# Patient Record
Sex: Male | Born: 1990 | Race: White | Hispanic: No | Marital: Married | Smoking: Former smoker
Health system: Southern US, Community
[De-identification: ages and names within clinical notes are randomized; demographics above are authoritative.]

## PROBLEM LIST (undated history)

## (undated) DIAGNOSIS — K859 Acute pancreatitis without necrosis or infection, unspecified: Secondary | ICD-10-CM

## (undated) DIAGNOSIS — E669 Obesity, unspecified: Secondary | ICD-10-CM

## (undated) DIAGNOSIS — R51 Headache: Secondary | ICD-10-CM

## (undated) DIAGNOSIS — I4892 Unspecified atrial flutter: Secondary | ICD-10-CM

## (undated) DIAGNOSIS — I480 Paroxysmal atrial fibrillation: Secondary | ICD-10-CM

## (undated) DIAGNOSIS — I420 Dilated cardiomyopathy: Secondary | ICD-10-CM

## (undated) DIAGNOSIS — F329 Major depressive disorder, single episode, unspecified: Secondary | ICD-10-CM

## (undated) DIAGNOSIS — G8929 Other chronic pain: Secondary | ICD-10-CM

## (undated) DIAGNOSIS — K760 Fatty (change of) liver, not elsewhere classified: Secondary | ICD-10-CM

## (undated) DIAGNOSIS — M545 Low back pain, unspecified: Secondary | ICD-10-CM

## (undated) DIAGNOSIS — I5022 Chronic systolic (congestive) heart failure: Secondary | ICD-10-CM

## (undated) DIAGNOSIS — I483 Typical atrial flutter: Secondary | ICD-10-CM

## (undated) DIAGNOSIS — I4891 Unspecified atrial fibrillation: Secondary | ICD-10-CM

## (undated) DIAGNOSIS — F419 Anxiety disorder, unspecified: Secondary | ICD-10-CM

## (undated) DIAGNOSIS — I429 Cardiomyopathy, unspecified: Secondary | ICD-10-CM

## (undated) DIAGNOSIS — I509 Heart failure, unspecified: Secondary | ICD-10-CM

## (undated) DIAGNOSIS — I209 Angina pectoris, unspecified: Secondary | ICD-10-CM

## (undated) DIAGNOSIS — K863 Pseudocyst of pancreas: Secondary | ICD-10-CM

## (undated) DIAGNOSIS — I499 Cardiac arrhythmia, unspecified: Secondary | ICD-10-CM

## (undated) DIAGNOSIS — R519 Headache, unspecified: Secondary | ICD-10-CM

## (undated) DIAGNOSIS — K219 Gastro-esophageal reflux disease without esophagitis: Secondary | ICD-10-CM

## (undated) DIAGNOSIS — F32A Depression, unspecified: Secondary | ICD-10-CM

## (undated) DIAGNOSIS — M1990 Unspecified osteoarthritis, unspecified site: Secondary | ICD-10-CM

## (undated) HISTORY — DX: Dilated cardiomyopathy: I42.0

## (undated) HISTORY — DX: Typical atrial flutter: I48.3

## (undated) HISTORY — DX: Unspecified atrial flutter: I48.92

## (undated) HISTORY — PX: FRACTURE SURGERY: SHX138

## (undated) HISTORY — PX: TONSILLECTOMY AND ADENOIDECTOMY: SUR1326

## (undated) HISTORY — PX: HAND SURGERY: SHX662

## (undated) HISTORY — DX: Chronic systolic (congestive) heart failure: I50.22

## (undated) HISTORY — DX: Cardiomyopathy, unspecified: I42.9

## (undated) HISTORY — DX: Pseudocyst of pancreas: K86.3

## (undated) HISTORY — DX: Unspecified atrial fibrillation: I48.91

---

## 2010-08-30 DIAGNOSIS — K859 Acute pancreatitis without necrosis or infection, unspecified: Secondary | ICD-10-CM

## 2010-08-30 HISTORY — DX: Acute pancreatitis without necrosis or infection, unspecified: K85.90

## 2014-01-13 DIAGNOSIS — R109 Unspecified abdominal pain: Secondary | ICD-10-CM | POA: Insufficient documentation

## 2014-01-14 ENCOUNTER — Encounter (HOSPITAL_COMMUNITY): Payer: Self-pay | Admitting: Emergency Medicine

## 2014-01-14 ENCOUNTER — Emergency Department (HOSPITAL_COMMUNITY)
Admission: EM | Admit: 2014-01-14 | Discharge: 2014-01-14 | Discharge status: Against Medical Advice | Payer: BC Managed Care – PPO | Attending: Emergency Medicine | Admitting: Emergency Medicine

## 2014-01-14 HISTORY — DX: Acute pancreatitis without necrosis or infection, unspecified: K85.90

## 2014-01-14 LAB — CBC WITH DIFFERENTIAL/PLATELET
Basophils Absolute: 0 10*3/uL (ref 0.0–0.1)
Basophils Relative: 0 % (ref 0–1)
Eosinophils Absolute: 0.3 10*3/uL (ref 0.0–0.7)
Eosinophils Relative: 2 % (ref 0–5)
HCT: 43.8 % (ref 39.0–52.0)
Hemoglobin: 14.7 g/dL (ref 13.0–17.0)
Lymphocytes Relative: 19 % (ref 12–46)
Lymphs Abs: 2.2 10*3/uL (ref 0.7–4.0)
MCH: 30.2 pg (ref 26.0–34.0)
MCHC: 33.6 g/dL (ref 30.0–36.0)
MCV: 90.1 fL (ref 78.0–100.0)
Monocytes Absolute: 1 10*3/uL (ref 0.1–1.0)
Monocytes Relative: 8 % (ref 3–12)
Neutro Abs: 8.2 10*3/uL — ABNORMAL HIGH (ref 1.7–7.7)
Neutrophils Relative %: 71 % (ref 43–77)
Platelets: 308 10*3/uL (ref 150–400)
RBC: 4.86 MIL/uL (ref 4.22–5.81)
RDW: 13.3 % (ref 11.5–15.5)
WBC: 11.7 10*3/uL — ABNORMAL HIGH (ref 4.0–10.5)

## 2014-01-14 LAB — URINALYSIS, ROUTINE W REFLEX MICROSCOPIC
Glucose, UA: NEGATIVE mg/dL
Hgb urine dipstick: NEGATIVE
Ketones, ur: NEGATIVE mg/dL
Nitrite: NEGATIVE
Protein, ur: NEGATIVE mg/dL
Specific Gravity, Urine: 1.028 (ref 1.005–1.030)
Urobilinogen, UA: 1 mg/dL (ref 0.0–1.0)
pH: 6 (ref 5.0–8.0)

## 2014-01-14 LAB — COMPREHENSIVE METABOLIC PANEL
ALT: 25 U/L (ref 0–53)
AST: 26 U/L (ref 0–37)
Albumin: 3.9 g/dL (ref 3.5–5.2)
Alkaline Phosphatase: 47 U/L (ref 39–117)
BUN: 12 mg/dL (ref 6–23)
CO2: 21 mEq/L (ref 19–32)
Calcium: 8.9 mg/dL (ref 8.4–10.5)
Chloride: 105 mEq/L (ref 96–112)
Creatinine, Ser: 0.6 mg/dL (ref 0.50–1.35)
GFR calc Af Amer: >90 mL/min (ref >90)
GFR calc non Af Amer: >90 mL/min (ref >90)
Glucose, Bld: 95 mg/dL (ref 70–99)
Potassium: 3.9 mEq/L (ref 3.7–5.3)
Sodium: 141 mEq/L (ref 137–147)
Total Bilirubin: 0.7 mg/dL (ref 0.3–1.2)
Total Protein: 7.7 g/dL (ref 6.0–8.3)

## 2014-01-14 LAB — URINE MICROSCOPIC-ADD ON

## 2014-01-14 LAB — LIPASE, BLOOD: Lipase: 2636 U/L — ABNORMAL HIGH (ref 11–59)

## 2014-01-14 NOTE — ED Notes (Signed)
Pt. reports mid abdominal pain onset this morning , denies nausea/vomitting or diarrhea. No fever or chills. Pt. stated history of pancreatitis.

## 2014-01-14 NOTE — ED Notes (Signed)
No answer x1, unable to find in w/r.

## 2015-12-29 DIAGNOSIS — K760 Fatty (change of) liver, not elsewhere classified: Secondary | ICD-10-CM

## 2015-12-29 HISTORY — DX: Fatty (change of) liver, not elsewhere classified: K76.0

## 2016-01-17 ENCOUNTER — Encounter (HOSPITAL_COMMUNITY): Payer: Self-pay | Admitting: Oncology

## 2016-01-17 ENCOUNTER — Emergency Department (HOSPITAL_COMMUNITY): Payer: BLUE CROSS/BLUE SHIELD

## 2016-01-17 ENCOUNTER — Inpatient Hospital Stay (HOSPITAL_COMMUNITY): Payer: BLUE CROSS/BLUE SHIELD

## 2016-01-17 ENCOUNTER — Inpatient Hospital Stay (HOSPITAL_COMMUNITY)
Admission: EM | Admit: 2016-01-17 | Discharge: 2016-01-27 | DRG: 438 | Disposition: A | Discharge status: Home / Self Care | Payer: BLUE CROSS/BLUE SHIELD | Attending: Internal Medicine | Admitting: Internal Medicine

## 2016-01-17 DIAGNOSIS — I5022 Chronic systolic (congestive) heart failure: Secondary | ICD-10-CM | POA: Insufficient documentation

## 2016-01-17 DIAGNOSIS — I4891 Unspecified atrial fibrillation: Secondary | ICD-10-CM | POA: Diagnosis present

## 2016-01-17 DIAGNOSIS — F1010 Alcohol abuse, uncomplicated: Secondary | ICD-10-CM | POA: Diagnosis not present

## 2016-01-17 DIAGNOSIS — Z833 Family history of diabetes mellitus: Secondary | ICD-10-CM

## 2016-01-17 DIAGNOSIS — I429 Cardiomyopathy, unspecified: Secondary | ICD-10-CM | POA: Diagnosis present

## 2016-01-17 DIAGNOSIS — K860 Alcohol-induced chronic pancreatitis: Secondary | ICD-10-CM | POA: Diagnosis present

## 2016-01-17 DIAGNOSIS — K8500 Idiopathic acute pancreatitis without necrosis or infection: Secondary | ICD-10-CM | POA: Diagnosis not present

## 2016-01-17 DIAGNOSIS — I493 Ventricular premature depolarization: Secondary | ICD-10-CM | POA: Diagnosis present

## 2016-01-17 DIAGNOSIS — Z87891 Personal history of nicotine dependence: Secondary | ICD-10-CM | POA: Diagnosis not present

## 2016-01-17 DIAGNOSIS — R1110 Vomiting, unspecified: Secondary | ICD-10-CM

## 2016-01-17 DIAGNOSIS — K852 Alcohol induced acute pancreatitis without necrosis or infection: Secondary | ICD-10-CM | POA: Diagnosis not present

## 2016-01-17 DIAGNOSIS — I509 Heart failure, unspecified: Secondary | ICD-10-CM

## 2016-01-17 DIAGNOSIS — K859 Acute pancreatitis without necrosis or infection, unspecified: Secondary | ICD-10-CM | POA: Diagnosis not present

## 2016-01-17 DIAGNOSIS — K863 Pseudocyst of pancreas: Secondary | ICD-10-CM | POA: Insufficient documentation

## 2016-01-17 DIAGNOSIS — I5021 Acute systolic (congestive) heart failure: Secondary | ICD-10-CM | POA: Diagnosis present

## 2016-01-17 DIAGNOSIS — E785 Hyperlipidemia, unspecified: Secondary | ICD-10-CM | POA: Diagnosis present

## 2016-01-17 DIAGNOSIS — F1020 Alcohol dependence, uncomplicated: Secondary | ICD-10-CM | POA: Diagnosis not present

## 2016-01-17 DIAGNOSIS — I5041 Acute combined systolic (congestive) and diastolic (congestive) heart failure: Secondary | ICD-10-CM | POA: Diagnosis not present

## 2016-01-17 DIAGNOSIS — R109 Unspecified abdominal pain: Secondary | ICD-10-CM | POA: Diagnosis not present

## 2016-01-17 DIAGNOSIS — Z6841 Body Mass Index (BMI) 40.0 and over, adult: Secondary | ICD-10-CM

## 2016-01-17 DIAGNOSIS — R6510 Systemic inflammatory response syndrome (SIRS) of non-infectious origin without acute organ dysfunction: Secondary | ICD-10-CM

## 2016-01-17 DIAGNOSIS — Z8719 Personal history of other diseases of the digestive system: Secondary | ICD-10-CM

## 2016-01-17 DIAGNOSIS — F1011 Alcohol abuse, in remission: Secondary | ICD-10-CM

## 2016-01-17 DIAGNOSIS — I483 Typical atrial flutter: Secondary | ICD-10-CM

## 2016-01-17 DIAGNOSIS — I4892 Unspecified atrial flutter: Secondary | ICD-10-CM | POA: Diagnosis present

## 2016-01-17 DIAGNOSIS — R0683 Snoring: Secondary | ICD-10-CM | POA: Diagnosis present

## 2016-01-17 DIAGNOSIS — K861 Other chronic pancreatitis: Secondary | ICD-10-CM | POA: Diagnosis not present

## 2016-01-17 DIAGNOSIS — R9431 Abnormal electrocardiogram [ECG] [EKG]: Secondary | ICD-10-CM | POA: Diagnosis not present

## 2016-01-17 DIAGNOSIS — K862 Cyst of pancreas: Secondary | ICD-10-CM | POA: Diagnosis not present

## 2016-01-17 HISTORY — DX: Fatty (change of) liver, not elsewhere classified: K76.0

## 2016-01-17 HISTORY — DX: Obesity, unspecified: E66.9

## 2016-01-17 HISTORY — DX: Unspecified atrial flutter: I48.92

## 2016-01-17 HISTORY — DX: Heart failure, unspecified: I50.9

## 2016-01-17 LAB — CBC WITH DIFFERENTIAL/PLATELET
Basophils Absolute: 0 10*3/uL (ref 0.0–0.1)
Basophils Relative: 0 %
Eosinophils Absolute: 0.1 10*3/uL (ref 0.0–0.7)
Eosinophils Relative: 1 %
HCT: 41.7 % (ref 39.0–52.0)
Hemoglobin: 13.9 g/dL (ref 13.0–17.0)
Lymphocytes Relative: 17 %
Lymphs Abs: 2.8 10*3/uL (ref 0.7–4.0)
MCH: 29.3 pg (ref 26.0–34.0)
MCHC: 33.3 g/dL (ref 30.0–36.0)
MCV: 88 fL (ref 78.0–100.0)
Monocytes Absolute: 1.2 10*3/uL — ABNORMAL HIGH (ref 0.1–1.0)
Monocytes Relative: 7 %
Neutro Abs: 12.6 10*3/uL — ABNORMAL HIGH (ref 1.7–7.7)
Neutrophils Relative %: 75 %
Platelets: 315 10*3/uL (ref 150–400)
RBC: 4.74 MIL/uL (ref 4.22–5.81)
RDW: 13.2 % (ref 11.5–15.5)
WBC: 16.8 10*3/uL — ABNORMAL HIGH (ref 4.0–10.5)

## 2016-01-17 LAB — URINALYSIS, ROUTINE W REFLEX MICROSCOPIC
Bilirubin Urine: NEGATIVE
Glucose, UA: NEGATIVE mg/dL
Hgb urine dipstick: NEGATIVE
Ketones, ur: NEGATIVE mg/dL
Nitrite: NEGATIVE
Protein, ur: NEGATIVE mg/dL
Specific Gravity, Urine: 1.02 (ref 1.005–1.030)
pH: 6 (ref 5.0–8.0)

## 2016-01-17 LAB — COMPREHENSIVE METABOLIC PANEL
ALT: 26 U/L (ref 17–63)
AST: 21 U/L (ref 15–41)
Albumin: 3.9 g/dL (ref 3.5–5.0)
Alkaline Phosphatase: 34 U/L — ABNORMAL LOW (ref 38–126)
Anion gap: 6 (ref 5–15)
BUN: 18 mg/dL (ref 6–20)
CO2: 23 mmol/L (ref 22–32)
Calcium: 8.6 mg/dL — ABNORMAL LOW (ref 8.9–10.3)
Chloride: 111 mmol/L (ref 101–111)
Creatinine, Ser: 0.87 mg/dL (ref 0.61–1.24)
GFR calc Af Amer: >60 mL/min (ref >60)
GFR calc non Af Amer: >60 mL/min (ref >60)
Glucose, Bld: 100 mg/dL — ABNORMAL HIGH (ref 65–99)
Potassium: 4.3 mmol/L (ref 3.5–5.1)
Sodium: 140 mmol/L (ref 135–145)
Total Bilirubin: 0.6 mg/dL (ref 0.3–1.2)
Total Protein: 6.9 g/dL (ref 6.5–8.1)

## 2016-01-17 LAB — LIPID PANEL
Cholesterol: 128 mg/dL (ref 0–200)
HDL: 37 mg/dL — ABNORMAL LOW (ref >40)
LDL Cholesterol: 65 mg/dL (ref 0–99)
Total CHOL/HDL Ratio: 3.5 RATIO
Triglycerides: 132 mg/dL (ref <150)
VLDL: 26 mg/dL (ref 0–40)

## 2016-01-17 LAB — URINE MICROSCOPIC-ADD ON: Bacteria, UA: NONE SEEN

## 2016-01-17 LAB — ECHOCARDIOGRAM COMPLETE
Height: 72 in
Weight: 5319.26 oz

## 2016-01-17 LAB — LIPASE, BLOOD: Lipase: 1745 U/L — ABNORMAL HIGH (ref 11–51)

## 2016-01-17 MED ORDER — METOPROLOL TARTRATE 5 MG/5ML IV SOLN
2.5000 mg | Freq: Once | INTRAVENOUS | Status: AC
  Administered 2016-01-17: 2.5 mg via INTRAVENOUS

## 2016-01-17 MED ORDER — DILTIAZEM HCL 100 MG IV SOLR
INTRAVENOUS | Status: AC
  Filled 2016-01-17: qty 100

## 2016-01-17 MED ORDER — FOLIC ACID 1 MG PO TABS
1.0000 mg | ORAL_TABLET | Freq: Every day | ORAL | Status: DC
  Administered 2016-01-18 – 2016-01-27 (×8): 1 mg via ORAL
  Filled 2016-01-17 (×11): qty 1

## 2016-01-17 MED ORDER — VITAMIN B-1 100 MG PO TABS
100.0000 mg | ORAL_TABLET | Freq: Every day | ORAL | Status: DC
  Administered 2016-01-18 – 2016-01-27 (×8): 100 mg via ORAL
  Filled 2016-01-17 (×11): qty 1

## 2016-01-17 MED ORDER — SODIUM CHLORIDE 0.9 % IV SOLN
Freq: Once | INTRAVENOUS | Status: AC
  Administered 2016-01-17: 06:00:00 via INTRAVENOUS

## 2016-01-17 MED ORDER — ONDANSETRON HCL 4 MG/2ML IJ SOLN
4.0000 mg | Freq: Once | INTRAMUSCULAR | Status: AC
  Administered 2016-01-17: 4 mg via INTRAVENOUS
  Filled 2016-01-17: qty 2

## 2016-01-17 MED ORDER — SODIUM CHLORIDE 0.9 % IV BOLUS (SEPSIS)
1000.0000 mL | Freq: Once | INTRAVENOUS | Status: AC
  Administered 2016-01-17: 1000 mL via INTRAVENOUS

## 2016-01-17 MED ORDER — HYDROMORPHONE HCL 1 MG/ML IJ SOLN
1.0000 mg | Freq: Once | INTRAMUSCULAR | Status: AC
  Administered 2016-01-17: 1 mg via INTRAVENOUS
  Filled 2016-01-17: qty 1

## 2016-01-17 MED ORDER — SODIUM CHLORIDE 0.9 % IV SOLN
INTRAVENOUS | Status: AC
  Administered 2016-01-17 – 2016-01-18 (×2): via INTRAVENOUS

## 2016-01-17 MED ORDER — METOPROLOL TARTRATE 5 MG/5ML IV SOLN
INTRAVENOUS | Status: AC
  Administered 2016-01-17: 2.5 mg via INTRAVENOUS
  Filled 2016-01-17: qty 5

## 2016-01-17 MED ORDER — LORAZEPAM 2 MG/ML IJ SOLN: 1.0000 mg | Freq: Four times a day (QID) | INTRAMUSCULAR | Status: AC | PRN

## 2016-01-17 MED ORDER — SODIUM CHLORIDE 0.9 % IV SOLN
INTRAVENOUS | Status: DC
  Administered 2016-01-17 – 2016-01-18 (×2): via INTRAVENOUS

## 2016-01-17 MED ORDER — ADULT MULTIVITAMIN W/MINERALS CH
1.0000 | ORAL_TABLET | Freq: Every day | ORAL | Status: DC
  Administered 2016-01-18 – 2016-01-27 (×7): 1 via ORAL
  Filled 2016-01-17 (×11): qty 1

## 2016-01-17 MED ORDER — LORAZEPAM 1 MG PO TABS: 1.0000 mg | ORAL_TABLET | Freq: Four times a day (QID) | ORAL | Status: AC | PRN

## 2016-01-17 MED ORDER — SODIUM CHLORIDE 0.9% FLUSH
3.0000 mL | Freq: Two times a day (BID) | INTRAVENOUS | Status: DC
  Administered 2016-01-17 – 2016-01-26 (×15): 3 mL via INTRAVENOUS

## 2016-01-17 MED ORDER — DILTIAZEM HCL 100 MG IV SOLR
5.0000 mg/h | INTRAVENOUS | Status: DC
  Administered 2016-01-17: 5 mg/h via INTRAVENOUS

## 2016-01-17 MED ORDER — HYDROMORPHONE HCL 1 MG/ML IJ SOLN
1.0000 mg | INTRAMUSCULAR | Status: DC | PRN
  Administered 2016-01-17 – 2016-01-20 (×11): 1 mg via INTRAVENOUS
  Filled 2016-01-17 (×12): qty 1

## 2016-01-17 MED ORDER — NICOTINE 21 MG/24HR TD PT24
21.0000 mg | MEDICATED_PATCH | Freq: Every day | TRANSDERMAL | Status: DC
  Administered 2016-01-17 – 2016-01-26 (×5): 21 mg via TRANSDERMAL
  Filled 2016-01-17 (×10): qty 1

## 2016-01-17 MED ORDER — TRAMADOL HCL 50 MG PO TABS
50.0000 mg | ORAL_TABLET | Freq: Four times a day (QID) | ORAL | Status: DC
  Administered 2016-01-17 – 2016-01-22 (×17): 50 mg via ORAL
  Filled 2016-01-17 (×21): qty 1

## 2016-01-17 MED ORDER — IOPAMIDOL (ISOVUE-300) INJECTION 61%
100.0000 mL | Freq: Once | INTRAVENOUS | Status: AC | PRN
  Administered 2016-01-17: 100 mL via INTRAVENOUS

## 2016-01-17 MED ORDER — OXYCODONE-ACETAMINOPHEN 5-325 MG PO TABS
1.0000 | ORAL_TABLET | Freq: Four times a day (QID) | ORAL | Status: DC | PRN
  Administered 2016-01-17 – 2016-01-20 (×6): 1 via ORAL
  Filled 2016-01-17 (×6): qty 1

## 2016-01-17 MED ORDER — THIAMINE HCL 100 MG/ML IJ SOLN
100.0000 mg | Freq: Every day | INTRAMUSCULAR | Status: DC
Start: 2016-01-17 — End: 2016-01-19

## 2016-01-17 MED ORDER — METOPROLOL TARTRATE 25 MG PO TABS
12.5000 mg | ORAL_TABLET | Freq: Two times a day (BID) | ORAL | Status: DC
  Administered 2016-01-17 – 2016-01-19 (×4): 12.5 mg via ORAL
  Filled 2016-01-17 (×4): qty 1

## 2016-01-17 NOTE — Significant Event (Addendum)
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

## 2016-01-17 NOTE — Consult Note (Signed)
CARDIOLOGY CONSULT NOTE       Patient ID: Nathaniel [name] MRN: [id number] DOB/AGE: [birth year] 25 y.o.  Admit date: 01/17/2016 Referring Physician:  MahalTorris Housemary Physician: No PCP Per Patient Primary Cardiologist:  New/Nishan Reason for Consultation:  Atrial flutter  Principal Problem:   SIRS without infection or organ dysfunction (HCC) Active Problems:   Acute pancreatitis   Obesity, BMI> 40   Snores   Former smoker   EtOH dependence (HCC)   Pancreatitis   HPI:  25 y.o. morbidly obese male. History of pancreatitis. First episode in 2012 has had 3 episodes.  Was at [facility] drinking when this episode started  CT with  Acute on chronic pancreatitis with 4.9 cm pseudocyst.  Lipase over 1200.  Admitted with abdominal pain.  Noted to be tachycardic on admission Review of telemetry strips Show rapid atrial flutter rates 150  Started on cardizem drip and rate control much better in afib now 70-80  CHA2VASC 0.  No previous episodes noted and really had no  Palpitations dyspnea syncope or chest pain. Father has heart issues but no one else in family has arrhythmias. No bleeding diathesis  Commutes to [facility] for work at [employer].   No bleeding issues  ROS All other systems reviewed and negative except as noted above  Past Medical History  Diagnosis Date  . Pancreatitis     History reviewed. No pertinent family history.  Social History   Social History  . Marital Status: Single    Spouse Name: N/A  . Number of Children: N/A  . Years of Education: N/A   Occupational History  . Not on file.   Social History Main Topics  . Smoking status: Former Games developer  . Smokeless tobacco: Current User  . Alcohol Use: Yes  . Drug Use: No  . Sexual Activity: Yes    Birth Control/ Protection: None   Other Topics Concern  . Not on file   Social History Narrative    History reviewed. No pertinent past surgical history.   [name] diltiazem (CARDIZEM) infusion      . folic acid  1  mg Oral Daily  . multivitamin with minerals  1 tablet Oral Daily  . sodium chloride flush  3 mL Intravenous Q12H  . thiamine  100 mg Oral Daily   Or  . thiamine  100 mg Intravenous Daily  . traMADol  50 mg Oral Q6H   . sodium chloride 150 mL/hr at 01/17/16 1154  . diltiazem (CARDIZEM) infusion 5 mg/hr (01/17/16 1244)    Physical Exam: Blood pressure 98/68, pulse 156, temperature 98.5 F (36.9 C), temperature source Oral, resp. rate 20, height 6' (1.829 m), weight 150.8 kg (332 lb 7.3 oz), SpO2 98 %.   Affect appropriate Obese  HEENT: normal Neck supple with no adenopathy JVP normal no bruits no thyromegaly Lungs clear with no wheezing and good diaphragmatic motion Heart:  S1/S2 no murmur, no rub, gallop or click PMI normal Abdomen: benighn, BS positve, no tenderness, no AAA no bruit.  No HSM or HJR Distal pulses intact with no bruits No edema Neuro non-focal Skin warm and dry No muscular weakness   Labs:   Lab Results  Component Value Date   WBC 16.8* 01/17/2016   HGB 13.9 01/17/2016   HCT 41.7 01/17/2016   MCV 88.0 01/17/2016   PLT 315 01/17/2016    Recent Labs Lab 01/17/16 0603  NA 140  K 4.3  CL 111  CO2 23  BUN 18  CREATININE  0.87  CALCIUM 8.6*  PROT 6.9  BILITOT 0.6  ALKPHOS 34*  ALT 26  AST 21  GLUCOSE 100*      Radiology: Ct Abdomen Pelvis W Contrast  01/17/2016  CLINICAL DATA:  Intermittent mid abdominal pain, history of pancreatitis EXAM: CT ABDOMEN AND PELVIS WITH CONTRAST TECHNIQUE: Multidetector CT imaging of the abdomen and pelvis was performed using the standard protocol following bolus administration of intravenous contrast. CONTRAST:  ISOVUE-300 IOPAMIDOL (ISOVUE-300) INJECTION 61% COMPARISON:  None. FINDINGS: Lower chest: Mild patchy opacities at the lung bases, likely atelectasis. Hepatobiliary: Mild hepatic steatosis. Gallbladder is unremarkable. No intrahepatic or extrahepatic ductal dilatation. Pancreas: Peripancreatic  inflammatory changes/stranding, suggesting acute on chronic pancreatitis. Bilobed, mildly thick-walled fluid collection/pseudocyst adjacent to the pancreatic head and inferior to the antral pyloric region of the stomach, measuring approximately 3.6 x 4.9 x 4.7 cm (series 2/ image 29). Spleen: Within normal limits. Adrenals/Urinary Tract: Left adrenal gland is mildly thickened, without discrete mass. Right adrenal gland is within normal limits. Kidneys are within normal limits.  No hydronephrosis. Bladder is within normal limits. Stomach/Bowel: Stomach is within normal limits. No evidence of bowel obstruction. Normal appendix (series 2/ image 63). Vascular/Lymphatic: No evidence of abdominal aortic aneurysm. Numerous peripancreatic and jejunal mesentery lymph nodes, measuring up to 11 mm short axis (series 2/ image 40), likely reactive. Reproductive: Prostate is unremarkable. Other: No abdominopelvic ascites. Tiny fat containing right inguinal hernia (series 2/image 91). Musculoskeletal: Mild degenerative changes the visualized thoracolumbar spine. IMPRESSION: Acute on chronic pancreatitis. Associated 4.9 cm pseudocyst adjacent to the pancreatic head and inferior to the antral pyloric region of the stomach. Mid abdominal/jejunal mesentery lymphadenopathy, favored to be reactive. Electronically Signed   By: Charline Bills M.D.   On: 01/17/2016 07:59    EKG: atrial flutter rate 150 no pre excitation    ASSESSMENT AND PLAN:  Fib/Flutter:  CHA2Vasc 0  Rate control with iv cardizem Would not acutely anticoagulate with pancreatitis increased risk of transforming into hemorraghic pseudocyst Will consider anticoagulation on d/c once inflammation better if he has not converted spontaneously  Check echo for structural heart disease   Pancreatitis:  Recurrent with large pseudocyst elevated WBC and lipase  PPt by ETOH Discussed need to stop drinking totally.  Check triglycerides, No history of GB Disease  Hydrate  Plan per primary service  Obesity:  At some point should consider bariatric evaluation   Signed: Charlton Haws 01/17/2016, 2:38 PM

## 2016-01-17 NOTE — H&P (Addendum)
History and Physical    Nathaniel [name] [id number]:[id number] DOB: May 31, 1991 DOA: 01/17/2016  PCP: No PCP Per Patient  Patient coming from:   25 year old male Morbid obesity, There is no height or weight on file to calculate BMI. No other medical illnesses Former smoker occasional social drinker drinker per his report Prior emergency room visit 12/2013 after holiday to [facility] -He had pancreatitis at the emergency room visit but because he had a job interview he decided to go home from the emergency room and did not have any further significant follow-up -He returns to the emergency room 01/17/2016 with acute one-day onset of pain -He had once again gone on holiday from 5/17-5/19, drank about 12 beers within that time and claims to drink once per month -He experienced 10 on 10 mid epigastric pain with radiation to the back starting at around 7 PM after eating dinner -He will try to manage at home did not take anything for it and came to the   in emergency room - found to have on CT scan acute pancreatitis with pseudocyst and sinus tachycardia in the 150 range -He has managed to tolerate a by mouth trial  BUN/creatinine 18/0.8 Lipase 1745 -Glucose 100 WBC 16.8 CT scan confirming 4.9 pseudocyst adjacent to pancreatic head and inferior to antral pyloric region of the stomach with adenopathy thought to be reactive   Family history -Mother has diabetes Father has history of cancer -Grandparents have history of cancer  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 Pancreatitis     History reviewed. No pertinent past surgical history.   reports that he has quit smoking. He uses smokeless tobacco. He reports that he drinks alcohol. He reports that he does not use illicit drugs.  No Known Allergies  History reviewed. No pertinent family history. c  above  Prior to Admission medications   Medication Sig Start Date End Date Taking? Authorizing Provider  ibuprofen (ADVIL,MOTRIN) 200 MG tablet Take 400-600 mg by mouth every 6 (six) hours as needed for moderate pain.   Yes Historical Provider, MD  traMADol (ULTRAM) 50 MG tablet Take 50 mg by mouth every 6 (six) hours as needed for moderate pain.   Yes Historical Provider, MD    Physical Exam: Filed Vitals:   01/17/16 0321 01/17/16 0705  BP: 134/76 124/92  Pulse: 76 149  Temp: 97.6 F (36.4 C) 98.3 F (36.8 C)  TempSrc: Oral Oral  Resp: 20 20  SpO2: 100% 91%      Constitutional: NAD, calm, comfortable Filed Vitals:   01/17/16 0321 01/17/16 0705  BP: 134/76 124/92  Pulse: 76 149  Temp: 97.6 F (36.4 C) 98.3 F (36.8 C)  TempSrc: Oral Oral  Resp: 20 20  SpO2: 100% 91%   EOMI NCAT-No pallor no icterus Mallampati 4 No thyromegaly no submandibular lymphadenopathy Abdomen soft but slightly tender in midepigastrium S1-S2 tachycardic 150 range, no PMI displacement No lower extremity edema Chest is clinically clear no added sound Rectal deferred Neurologically intact up sitting up able to move all 4 limbs without deficit no rash nor skin lesion Mood is euthymic he is pleasant and and a little bit anxious Able to move all joints without deficit no swelling of joints  Labs on Admission: I have personally reviewed following labs and imaging studies  CBC:  Recent Labs Lab 01/17/16  0603  WBC 16.8*  NEUTROABS 12.6*  HGB 13.9  HCT 41.7  MCV 88.0  PLT 315   Basic Metabolic Panel:  Recent Labs Lab 01/17/16 0603  NA 140  K 4.3  CL 111  CO2 23  GLUCOSE 100*  BUN 18  CREATININE 0.87  CALCIUM 8.6*   GFR: CrCl cannot be calculated (Unknown ideal weight.). Liver Function Tests:  Recent Labs Lab 01/17/16 0603  AST 21  ALT 26  ALKPHOS 34*  BILITOT 0.6  PROT 6.9  ALBUMIN 3.9    Recent Labs Lab 01/17/16 0603  LIPASE 1745*   No results for input(s):  AMMONIA in the last 168 hours. Coagulation Profile: No results for input(s): INR, PROTIME in the last 168 hours. Cardiac Enzymes: No results for input(s): CKTOTAL, CKMB, CKMBINDEX, TROPONINI in the last 168 hours. BNP (last 3 results) No results for input(s): PROBNP in the last 8760 hours. HbA1C: No results for input(s): HGBA1C in the last 72 hours. CBG: No results for input(s): GLUCAP in the last 168 hours. Lipid Profile: No results for input(s): CHOL, HDL, LDLCALC, TRIG, CHOLHDL, LDLDIRECT in the last 72 hours. Thyroid Function Tests: No results for input(s): TSH, T4TOTAL, FREET4, T3FREE, THYROIDAB in the last 72 hours. Anemia Panel: No results for input(s): VITAMINB12, FOLATE, FERRITIN, TIBC, IRON, RETICCTPCT in the last 72 hours. Urine analysis:    Component Value Date/Time   COLORURINE YELLOW 01/17/2016 0609   APPEARANCEUR CLOUDY* 01/17/2016 0609   LABSPEC 1.020 01/17/2016 0609   PHURINE 6.0 01/17/2016 0609   GLUCOSEU NEGATIVE 01/17/2016 0609   HGBUR NEGATIVE 01/17/2016 0609   BILIRUBINUR NEGATIVE 01/17/2016 0609   KETONESUR NEGATIVE 01/17/2016 0609   PROTEINUR NEGATIVE 01/17/2016 0609   UROBILINOGEN 1.0 01/14/2014 0032   NITRITE NEGATIVE 01/17/2016 0609   LEUKOCYTESUR TRACE* 01/17/2016 0609    Radiological Exams on Admission: Ct Abdomen Pelvis W Contrast  01/17/2016  CLINICAL DATA:  Intermittent mid abdominal pain, history of pancreatitis EXAM: CT ABDOMEN AND PELVIS WITH CONTRAST TECHNIQUE: Multidetector CT imaging of the abdomen and pelvis was performed using the standard protocol following bolus administration of intravenous contrast. CONTRAST:  ISOVUE-300 IOPAMIDOL (ISOVUE-300) INJECTION 61% COMPARISON:  None. FINDINGS: Lower chest: Mild patchy opacities at the lung bases, likely atelectasis. Hepatobiliary: Mild hepatic steatosis. Gallbladder is unremarkable. No intrahepatic or extrahepatic ductal dilatation. Pancreas: Peripancreatic inflammatory changes/stranding,  suggesting acute on chronic pancreatitis. Bilobed, mildly thick-walled fluid collection/pseudocyst adjacent to the pancreatic head and inferior to the antral pyloric region of the stomach, measuring approximately 3.6 x 4.9 x 4.7 cm (series 2/ image 29). Spleen: Within normal limits. Adrenals/Urinary Tract: Left adrenal gland is mildly thickened, without discrete mass. Right adrenal gland is within normal limits. Kidneys are within normal limits.  No hydronephrosis. Bladder is within normal limits. Stomach/Bowel: Stomach is within normal limits. No evidence of bowel obstruction. Normal appendix (series 2/ image 63). Vascular/Lymphatic: No evidence of abdominal aortic aneurysm. Numerous peripancreatic and jejunal mesentery lymph nodes, measuring up to 11 mm short axis (series 2/ image 40), likely reactive. Reproductive: Prostate is unremarkable. Other: No abdominopelvic ascites. Tiny fat containing right inguinal hernia (series 2/image 91). Musculoskeletal: Mild degenerative changes the visualized thoracolumbar spine. IMPRESSION: Acute on chronic pancreatitis. Associated 4.9 cm pseudocyst adjacent to the pancreatic head and inferior to the antral pyloric region of the stomach. Mid abdominal/jejunal mesentery lymphadenopathy, favored to be reactive. Electronically Signed   By: Charline Bills M.D.   On: 01/17/2016 07:59    EKG: Independently reviewed. Sinus tachycardia with  rate related changes no ST-T wave elevation or depressions  Assessment/Plan Principal Problem:   SIRS without infection or organ dysfunction (HCC) Active Problems:   Acute pancreatitis   Obesity, BMI> 40   Snores   Former smoker   EtOH dependence (HCC)   Patient presents with tachycardia leukocytosis and clinical criteria for SIRS without evidence of infection -We will give IV saline at 1 50 cc per hour for 24 hours and reevaluate -There is no source for infection and I believe this is all secondary to acute pancreatitis with a  lipase of 1700 -We will monitor him on telemetry as he is hemodynamically stable and is in need of volume resuscitation is mentating well and does not seem to have any evidence of acute decompensation  Sinus tachycardia -Secondary to primary process -Reactive and would not workup further -Volume replete and expect that heart rate will come down  Acute/chronic pancreatitis -Secondary to ethanol use DDX =? Hypertriglyceridemia -Going forward patient will need follow-up with either gastroenterology or  general surgery -Apache/Ranson score is negative and CT discriminant score does not confirm any necrosis -He's never had any gallstone pathology and may need to have his gallbladder issues worked up in the future -I would recommend that patient completely desist from ETOH  Super morbid obesity -Patient's weight is 315 pounds -I've mentioned to him the risk of impaired glucose tolerance diabetes and all cause mortality being increased in layman's terms -He understands the need for weight loss  Former smoker  Snores -Consider Epworth study as per PCP/outpatient pulmonology  Ethanol use -Although I believe the patient when he says that he does not drink more than once a month, it may be very reasonable to place him on Cipro scale and place him on Ativan protocol   Lovenox Full code Admit to inpatient, telemetry-does not require stepdown placement at present -Expect 2-3 days   Nathaniel Mura MD [facility] Pager [phone]  If 7PM-7AM, please contact night-coverage [email or web address] Password [email or web address]  01/17/2016, 8:58 AM

## 2016-01-17 NOTE — ED Notes (Signed)
Transporting pt to floor to give bedside report.

## 2016-01-17 NOTE — ED Notes (Signed)
Pt c/o intermittent mid abdominal pain.  States it feels like a pulled muscle.  Also c/o mid back pain.  Last BM 30 minutes ago.  Rates pain 10/10.

## 2016-01-17 NOTE — Progress Notes (Signed)
Patient in SR, HR in 70s.  Dr. Mahala Menghini aware; will administer PO metoprolol and d/c cardizem drip.

## 2016-01-17 NOTE — ED Notes (Signed)
Patient given water for PO challenge by Wilhemina Cash, EMT-- will con't to monitor.

## 2016-01-17 NOTE — Progress Notes (Signed)
Cardizem drip d/c @ 7:30 pt heart rate 74 bpm, SR

## 2016-01-17 NOTE — ED Notes (Signed)
[id number] Nathaniel [name]

## 2016-01-17 NOTE — ED Provider Notes (Signed)
CSN: [id number]     Arrival date & time 01/17/16  0303 History   First MD Initiated Contact with Patient 01/17/16 [phone]     Chief Complaint  Patient presents with  . Abdominal Pain     (Consider location/radiation/quality/duration/timing/severity/associated sxs/prior Treatment) HPI Comments: Patient presents to the emergency department for evaluation of abdominal pain. Patient is complaining of severe, sharp and stabbing pain in the center of his abdomen. Patient reports 2 previous episodes of pancreatitis with similar pain in the past. Patient denies blurred alcohol use, but he does have a history of drinking alcohol and was recently on vacation where he does admit to drinking alcohol. Patient has not had vomiting, diarrhea or constipation associated with the symptoms. There is no fever.  Patient is a 25 y.o. male presenting with abdominal pain.  Abdominal Pain   Past Medical History  Diagnosis Date  . Pancreatitis    History reviewed. No pertinent past surgical history. History reviewed. No pertinent family history. Social History  Substance Use Topics  . Smoking status: Former Games developer  . Smokeless tobacco: Current User  . Alcohol Use: Yes    Review of Systems  Gastrointestinal: Positive for abdominal pain.  All other systems reviewed and are negative.     Allergies  Review of patient's allergies indicates no known allergies.  Home Medications   Prior to Admission medications   Medication Sig Start Date End Date Taking? Authorizing Provider  ibuprofen (ADVIL,MOTRIN) 200 MG tablet Take 400-600 mg by mouth every 6 (six) hours as needed for moderate pain.   Yes Historical Provider, MD  traMADol (ULTRAM) 50 MG tablet Take 50 mg by mouth every 6 (six) hours as needed for moderate pain.   Yes Historical Provider, MD   BP 124/92 mmHg  Pulse 149  Temp(Src) 98.3 F (36.8 C) (Oral)  Resp 20  SpO2 91% Physical Exam  Constitutional: He is oriented to person, place, and time.  He appears well-developed and well-nourished. No distress.  HENT:  Head: Normocephalic and atraumatic.  Right Ear: Hearing normal.  Left Ear: Hearing normal.  Nose: Nose normal.  Mouth/Throat: Oropharynx is clear and moist and mucous membranes are normal.  Eyes: Conjunctivae and EOM are normal. Pupils are equal, round, and reactive to light.  Neck: Normal range of motion. Neck supple.  Cardiovascular: Regular rhythm, S1 normal and S2 normal.  Exam reveals no gallop and no friction rub.   No murmur heard. Pulmonary/Chest: Effort normal and breath sounds normal. No respiratory distress. He exhibits no tenderness.  Abdominal: Soft. Normal appearance and bowel sounds are normal. There is no hepatosplenomegaly. There is tenderness in the epigastric area. There is no rebound, no guarding, no tenderness at McBurney's point and negative Murphy's sign. No hernia.  Musculoskeletal: Normal range of motion.  Neurological: He is alert and oriented to person, place, and time. He has normal strength. No cranial nerve deficit or sensory deficit. Coordination normal. GCS eye subscore is 4. GCS verbal subscore is 5. GCS motor subscore is 6.  Skin: Skin is warm, dry and intact. No rash noted. No cyanosis.  Psychiatric: He has a normal mood and affect. His speech is normal and behavior is normal. Thought content normal.  Nursing note and vitals reviewed.   ED Course  Procedures (including critical care time) Labs Review Labs Reviewed  CBC WITH DIFFERENTIAL/PLATELET - Abnormal; Notable for the following:    WBC 16.8 (*)    Neutro Abs 12.6 (*)    Monocytes Absolute 1.2 (*)  All other components within normal limits  COMPREHENSIVE METABOLIC PANEL - Abnormal; Notable for the following:    Glucose, Bld 100 (*)    Calcium 8.6 (*)    Alkaline Phosphatase 34 (*)    All other components within normal limits  LIPASE, BLOOD - Abnormal; Notable for the following:    Lipase 1745 (*)    All other components  within normal limits  URINALYSIS, ROUTINE W REFLEX MICROSCOPIC (NOT AT [facility]) - Abnormal; Notable for the following:    APPearance CLOUDY (*)    Leukocytes, UA TRACE (*)    All other components within normal limits  URINE MICROSCOPIC-ADD ON - Abnormal; Notable for the following:    Squamous Epithelial / LPF 0-5 (*)    All other components within normal limits    Imaging Review Ct Abdomen Pelvis W Contrast  01/17/2016  CLINICAL DATA:  Intermittent mid abdominal pain, history of pancreatitis EXAM: CT ABDOMEN AND PELVIS WITH CONTRAST TECHNIQUE: Multidetector CT imaging of the abdomen and pelvis was performed using the standard protocol following bolus administration of intravenous contrast. CONTRAST:  ISOVUE-300 IOPAMIDOL (ISOVUE-300) INJECTION 61% COMPARISON:  None. FINDINGS: Lower chest: Mild patchy opacities at the lung bases, likely atelectasis. Hepatobiliary: Mild hepatic steatosis. Gallbladder is unremarkable. No intrahepatic or extrahepatic ductal dilatation. Pancreas: Peripancreatic inflammatory changes/stranding, suggesting acute on chronic pancreatitis. Bilobed, mildly thick-walled fluid collection/pseudocyst adjacent to the pancreatic head and inferior to the antral pyloric region of the stomach, measuring approximately 3.6 x 4.9 x 4.7 cm (series 2/ image 29). Spleen: Within normal limits. Adrenals/Urinary Tract: Left adrenal gland is mildly thickened, without discrete mass. Right adrenal gland is within normal limits. Kidneys are within normal limits.  No hydronephrosis. Bladder is within normal limits. Stomach/Bowel: Stomach is within normal limits. No evidence of bowel obstruction. Normal appendix (series 2/ image 63). Vascular/Lymphatic: No evidence of abdominal aortic aneurysm. Numerous peripancreatic and jejunal mesentery lymph nodes, measuring up to 11 mm short axis (series 2/ image 40), likely reactive. Reproductive: Prostate is unremarkable. Other: No abdominopelvic ascites. Tiny  fat containing right inguinal hernia (series 2/image 91). Musculoskeletal: Mild degenerative changes the visualized thoracolumbar spine. IMPRESSION: Acute on chronic pancreatitis. Associated 4.9 cm pseudocyst adjacent to the pancreatic head and inferior to the antral pyloric region of the stomach. Mid abdominal/jejunal mesentery lymphadenopathy, favored to be reactive. Electronically Signed   By: Charline Bills M.D.   On: 01/17/2016 07:59   I have personally reviewed and evaluated these images and lab results as part of my medical decision-making.   EKG Interpretation None      MDM   Final diagnoses:  Acute pancreatitis, unspecified pancreatitis type    Patient presents to the ER for evaluation of abdominal pain. Patient reports previous episodes of pancreatitis with similar pain. Patient does have markedly elevated lipase. CT scan reveals findings of acute pancreatitis with 4.9 x 4.7 x 3.6 cm pseudocyst.  Patient administered IV Dilaudid and fluids with some improvement of his pain. Patient will require hospitalization for further management.    Gilda Crease, MD 01/17/16 [phone]

## 2016-01-17 NOTE — Progress Notes (Signed)
ED RN while bringing patient to floor reported that patient's EKG in ED showed A. Fib, but that he was unable to show Dr. Mahala Menghini because he had already left ED. Placed telemetry on patient during admission and central monitoring reported patient was in Atrial Flutter.  EKG was performed, Dr. Mahala Menghini notified of results.  Patient has no complaints except for abdominal pain, in NAD.  Oxygen placed on patient via nasal cannula.  IV Metoprolol given, Cardizem drip started; patient tolerating.  Will continue to monitor patient

## 2016-01-17 NOTE — Progress Notes (Signed)
  Echocardiogram 2D Echocardiogram has been performed.  Leta Jungling M 01/17/2016, 3:29 PM

## 2016-01-17 NOTE — ED Notes (Signed)
Unable to call report due to rapid response on floor. Will call back to give report.

## 2016-01-18 DIAGNOSIS — K859 Acute pancreatitis without necrosis or infection, unspecified: Secondary | ICD-10-CM | POA: Insufficient documentation

## 2016-01-18 DIAGNOSIS — F1010 Alcohol abuse, uncomplicated: Secondary | ICD-10-CM

## 2016-01-18 DIAGNOSIS — K861 Other chronic pancreatitis: Secondary | ICD-10-CM

## 2016-01-18 DIAGNOSIS — I5021 Acute systolic (congestive) heart failure: Secondary | ICD-10-CM | POA: Insufficient documentation

## 2016-01-18 DIAGNOSIS — E785 Hyperlipidemia, unspecified: Secondary | ICD-10-CM | POA: Insufficient documentation

## 2016-01-18 LAB — COMPREHENSIVE METABOLIC PANEL
ALT: 20 U/L (ref 17–63)
AST: 16 U/L (ref 15–41)
Albumin: 3.5 g/dL (ref 3.5–5.0)
Alkaline Phosphatase: 29 U/L — ABNORMAL LOW (ref 38–126)
Anion gap: 5 (ref 5–15)
BUN: 9 mg/dL (ref 6–20)
CO2: 26 mmol/L (ref 22–32)
Calcium: 8.1 mg/dL — ABNORMAL LOW (ref 8.9–10.3)
Chloride: 108 mmol/L (ref 101–111)
Creatinine, Ser: 0.77 mg/dL (ref 0.61–1.24)
GFR calc Af Amer: >60 mL/min (ref >60)
GFR calc non Af Amer: >60 mL/min (ref >60)
Glucose, Bld: 95 mg/dL (ref 65–99)
Potassium: 4.4 mmol/L (ref 3.5–5.1)
Sodium: 139 mmol/L (ref 135–145)
Total Bilirubin: 1.1 mg/dL (ref 0.3–1.2)
Total Protein: 6.3 g/dL — ABNORMAL LOW (ref 6.5–8.1)

## 2016-01-18 LAB — PROTIME-INR
INR: 1.22 (ref 0.00–1.49)
Prothrombin Time: 15.1 seconds (ref 11.6–15.2)

## 2016-01-18 LAB — MAGNESIUM: Magnesium: 1.8 mg/dL (ref 1.7–2.4)

## 2016-01-18 LAB — CBC
HCT: 36.9 % — ABNORMAL LOW (ref 39.0–52.0)
Hemoglobin: 12.1 g/dL — ABNORMAL LOW (ref 13.0–17.0)
MCH: 28.9 pg (ref 26.0–34.0)
MCHC: 32.8 g/dL (ref 30.0–36.0)
MCV: 88.3 fL (ref 78.0–100.0)
Platelets: 264 10*3/uL (ref 150–400)
RBC: 4.18 MIL/uL — ABNORMAL LOW (ref 4.22–5.81)
RDW: 13.2 % (ref 11.5–15.5)
WBC: 12.2 10*3/uL — ABNORMAL HIGH (ref 4.0–10.5)

## 2016-01-18 LAB — LIPASE, BLOOD: Lipase: 645 U/L — ABNORMAL HIGH (ref 11–51)

## 2016-01-18 LAB — TSH: TSH: 1.653 u[IU]/mL (ref 0.350–4.500)

## 2016-01-18 MED ORDER — SODIUM CHLORIDE 0.9 % IV SOLN
INTRAVENOUS | Status: DC
  Administered 2016-01-18 – 2016-01-20 (×5): via INTRAVENOUS

## 2016-01-18 MED ORDER — DILTIAZEM HCL 25 MG/5ML IV SOLN
5.0000 mg | Freq: Once | INTRAVENOUS | Status: AC
  Administered 2016-01-18: 5 mg via INTRAVENOUS
  Filled 2016-01-18: qty 5

## 2016-01-18 MED ORDER — METOPROLOL TARTRATE 5 MG/5ML IV SOLN
2.5000 mg | Freq: Once | INTRAVENOUS | Status: DC
Start: 2016-01-18 — End: 2016-01-19
  Filled 2016-01-18: qty 5

## 2016-01-18 MED ORDER — ONDANSETRON HCL 4 MG/2ML IJ SOLN
4.0000 mg | Freq: Three times a day (TID) | INTRAMUSCULAR | Status: DC | PRN
  Administered 2016-01-18 – 2016-01-23 (×4): 4 mg via INTRAVENOUS
  Filled 2016-01-18 (×4): qty 2

## 2016-01-18 MED ORDER — METOPROLOL TARTRATE 5 MG/5ML IV SOLN
5.0000 mg | Freq: Once | INTRAVENOUS | Status: AC
  Administered 2016-01-18: 5 mg via INTRAVENOUS
  Filled 2016-01-18: qty 5

## 2016-01-18 MED ORDER — LISINOPRIL 10 MG PO TABS
5.0000 mg | ORAL_TABLET | Freq: Every day | ORAL | Status: DC
  Administered 2016-01-18 – 2016-01-26 (×9): 5 mg via ORAL
  Filled 2016-01-18 (×10): qty 1

## 2016-01-18 MED ORDER — METOPROLOL TARTRATE 5 MG/5ML IV SOLN
2.5000 mg | Freq: Once | INTRAVENOUS | Status: AC
  Administered 2016-01-18: 2.5 mg via INTRAVENOUS
  Filled 2016-01-18: qty 5

## 2016-01-18 NOTE — Care Management Note (Signed)
Case Management Note  Patient Details  Name: Nathaniel [name] MRN: [id number] Date of Birth: Apr 13, 1991  Subjective/Objective:                  Atrial flutter  Action/Plan: CM spoke with the patient at the bedside. Patient provided with the telephone number for [facility] and instructed to call his health plan's customer service number for a list of in-network PCP's. Patient has BCBS.   Expected Discharge Date:  01/20/16               Expected Discharge Plan:  Home/Self Care  In-House Referral:     Discharge planning Services  CM Consult  Post Acute Care Choice:    Choice offered to:     DME Arranged:  N/A DME Agency:  NA  HH Arranged:  NA HH Agency:  NA  Status of Service:  In process, will continue to follow  Medicare Important Message Given:    Date Medicare IM Given:    Medicare IM give by:    Date Additional Medicare IM Given:    Additional Medicare Important Message give by:     If discussed at Long Length of Stay Meetings, dates discussed:    Additional Comments:  Antony Haste, RN 01/18/2016, 11:53 AM

## 2016-01-18 NOTE — Progress Notes (Signed)
Patient ID: Nathaniel [name], male   DOB: [birth year], 25 y.o.   MRN: [id number]    Subjective:  Denies SSCP, palpitations or Dyspnea Scarred to eat has had soft solids   Objective:  Filed Vitals:   01/17/16 1806 01/17/16 1815 01/17/16 2017 01/18/16 0519  BP: 105/65 110/72 108/38 115/74  Pulse: 74  78 101  Temp:   98.2 F (36.8 C) 98.8 F (37.1 C)  TempSrc:   Oral Oral  Resp:    20  Height:      Weight:      SpO2:   100% 94%    Intake/Output from previous day:  Intake/Output Summary (Last 24 hours) at 01/18/16 0853 Last data filed at 01/18/16 0556  Gross per 24 hour  Intake 3003.75 ml  Output      0 ml  Net 3003.75 ml    Physical Exam: Affect appropriate Obese white male  HEENT: normal Neck supple with no adenopathy JVP normal no bruits no thyromegaly Lungs clear with no wheezing and good diaphragmatic motion Heart:  S1/S2 no murmur, no rub, gallop or click PMI normal Abdomen: benighn, BS positve, no tenderness, no AAA no bruit.  No HSM or HJR Distal pulses intact with no bruits No edema Neuro non-focal Skin warm and dry No muscular weakness   Lab Results: Basic Metabolic Panel:  Recent Labs  03/55/97 0603 01/18/16 0523  NA 140 139  K 4.3 4.4  CL 111 108  CO2 23 26  GLUCOSE 100* 95  BUN 18 9  CREATININE 0.87 0.77  CALCIUM 8.6* 8.1*   Liver Function Tests:  Recent Labs  01/17/16 0603 01/18/16 0523  AST 21 16  ALT 26 20  ALKPHOS 34* 29*  BILITOT 0.6 1.1  PROT 6.9 6.3*  ALBUMIN 3.9 3.5    Recent Labs  01/17/16 0603  LIPASE 1745*   CBC:  Recent Labs  01/17/16 0603 01/18/16 0523  WBC 16.8* 12.2*  NEUTROABS 12.6*  --   HGB 13.9 12.1*  HCT 41.7 36.9*  MCV 88.0 88.3  PLT 315 264   Fasting Lipid Panel:  Recent Labs  01/17/16 1238  CHOL 128  HDL 37*  LDLCALC 65  TRIG 416  CHOLHDL 3.5    Imaging: Ct Abdomen Pelvis W Contrast  01/17/2016  CLINICAL DATA:  Intermittent mid abdominal pain, history of pancreatitis EXAM: CT  ABDOMEN AND PELVIS WITH CONTRAST TECHNIQUE: Multidetector CT imaging of the abdomen and pelvis was performed using the standard protocol following bolus administration of intravenous contrast. CONTRAST:  ISOVUE-300 IOPAMIDOL (ISOVUE-300) INJECTION 61% COMPARISON:  None. FINDINGS: Lower chest: Mild patchy opacities at the lung bases, likely atelectasis. Hepatobiliary: Mild hepatic steatosis. Gallbladder is unremarkable. No intrahepatic or extrahepatic ductal dilatation. Pancreas: Peripancreatic inflammatory changes/stranding, suggesting acute on chronic pancreatitis. Bilobed, mildly thick-walled fluid collection/pseudocyst adjacent to the pancreatic head and inferior to the antral pyloric region of the stomach, measuring approximately 3.6 x 4.9 x 4.7 cm (series 2/ image 29). Spleen: Within normal limits. Adrenals/Urinary Tract: Left adrenal gland is mildly thickened, without discrete mass. Right adrenal gland is within normal limits. Kidneys are within normal limits.  No hydronephrosis. Bladder is within normal limits. Stomach/Bowel: Stomach is within normal limits. No evidence of bowel obstruction. Normal appendix (series 2/ image 63). Vascular/Lymphatic: No evidence of abdominal aortic aneurysm. Numerous peripancreatic and jejunal mesentery lymph nodes, measuring up to 11 mm short axis (series 2/ image 40), likely reactive. Reproductive: Prostate is unremarkable. Other: No abdominopelvic ascites. Tiny fat containing right  inguinal hernia (series 2/image 91). Musculoskeletal: Mild degenerative changes the visualized thoracolumbar spine. IMPRESSION: Acute on chronic pancreatitis. Associated 4.9 cm pseudocyst adjacent to the pancreatic head and inferior to the antral pyloric region of the stomach. Mid abdominal/jejunal mesentery lymphadenopathy, favored to be reactive. Electronically Signed   By: Charline Bills M.D.   On: 01/17/2016 07:59    Cardiac Studies:  ECG:  Aflutter rate 158     Telemetry:  afib  rates 90-110  Echo:  EF 35-40%   Medications:   . folic acid  1 mg Oral Daily  . metoprolol  2.5 mg Intravenous Once  . metoprolol tartrate  12.5 mg Oral BID  . multivitamin with minerals  1 tablet Oral Daily  . nicotine  21 mg Transdermal Daily  . sodium chloride flush  3 mL Intravenous Q12H  . thiamine  100 mg Oral Daily   Or  . thiamine  100 mg Intravenous Daily  . traMADol  50 mg Oral Q6H     . sodium chloride 150 mL/hr at 01/18/16 1610    Assessment/Plan:  Flutter:  Will need anticoagulation once inflammation from pancreatitis resolved.  Continue metoprolol for rate control DCM:  Echo with decreased EF Undoubtedly non ischemic and likely rate related given that he was in rapid flutter And did not know it Start low dose ACE Check BNP consider adding aldactone prior to d/c  Pancreatitis:  With large pseudocyst still not taking PO well Discussed ETOH cessation plan per GI  Charlton Haws 01/18/2016, 8:53 AM

## 2016-01-18 NOTE — Progress Notes (Signed)
On call provider contacted to report pt sustaining HR of 150-160s with intermittent flutter/fib, Pt remains asymptomatic, denies chest pain.

## 2016-01-18 NOTE — Progress Notes (Signed)
On call provider K. Sofia, contact to report patient heart rate in the 150's with sinus tachycardia and intermittent afib/aflutter. Order written for metoprolol. Prior to adminstration pt heart rate decreased to 90s and sustained in sinus rhythm held metoprolol.  Made on call provider aware of dose held, and was instructed should rate orb rhythm change to administer med.

## 2016-01-18 NOTE — Progress Notes (Signed)
Pt continues to have  Heart rate  160's with interrmittent afib/aflutter s/p administration of  2.5mg  of Metoprolol. Oncall provider [place] has been notfied.  Call back and gave a verbal order for Cardizem 5 mg IV one time dose for arrhythmia. Instructed to call back if rate remains unchanged.

## 2016-01-18 NOTE — Progress Notes (Addendum)
[facility] PROGRESS NOTE  Nathaniel [name] PYK:[id number] DOB: 1991/06/24 DOA: 01/17/2016 PCP: No PCP Per Patient  Brief summary and H&P 25y/o with past medical hx significant for alcohol abuse, former tobacco abuse and pancreatitis. Admitted secondary to abd pain and another burst of Pancreatitis (after [place] Patient was found to have SIRS (with pseudocyst in CT abdomen; no fever) and ended experiencing A. Flutter/A. Fib with RVR.  2-D echo demonstrated EF 35-40%; no CP, no palpitations. Cardiology consulted.  Assessment/Plan: 1-acute on chronic pancreatitis: secondary to alcohol consumption  -pseudocyst appreciated on CT abd -no fever, no SOB -WBC's trending down -pain improving; but still with anorexia -reports no N/V -continue bowel rest and slowly advancing diet -will repeat lipase level -CMET in am -no abx's needed -alcohol cessation counseling provided  2-SIRS: improving/resolving -associated with problem #1  3-atrial flutter/A. Fib -CHADsVASC score 1 (heart failure) -cardiology on board, will follow rec's -metoprolol for rate control -will check TSH and Mg  4-acute systolic heart failure: EF 35-40% -secondary to arrhythmias and alcohol most likely  -b-blocker and ACE inhibitor added -low sodium diet recommended -will follow daily weights and strict intake and output  -will check BNP and per cardiology most likely starting aldactone prior to discharge; will follow rec's  5-dyslipidemia: -HDL 37 -advise to follow fisch oil as an outpatient -LDL 64  6-obesity: -Body mass index is 45.08 kg/(m^2). -low calorie diet, exercise and if needed evaluation by bariatric surgery as an outpatient -patient was very receptive to message and looking to start lifestyle changes    Code Status: Full code Family Communication: Fiancee at bedside Disposition Plan: remains inpatient; continue fluid resuscitation and slowly advance diet.  Still with pain and not eating much. Patient also with intermittent A. Flutter. Will follow cardiology rec's   Consultants:  Cardiology service  Procedures:  2-D echo: - Left ventricle: The cavity size was normal. Systolic function was  moderately reduced. The estimated ejection fraction was in the  range of 35% to 40%. Moderate diffuse hypokinesis. Regional wall  motion abnormalities cannot be excluded. - Left atrium: The atrium was mildly dilated.  Antibiotics:  None  HPI/Subjective: Afebrile, no CP, no SOB and reports no palpitations sensation. Patient remains in sinus tachycardia, with intermittent transition to A. Flutter with RVR (especially with exertion). No nausea, no vomiting and reported just mild mid epigastric pain.  Objective: Filed Vitals:   01/17/16 2017 01/18/16 0519  BP: 108/38 115/74  Pulse: 78 101  Temp: 98.2 F (36.8 C) 98.8 F (37.1 C)  Resp:  20    Intake/Output Summary (Last 24 hours) at 01/18/16 0810 Last data filed at 01/18/16 0556  Gross per 24 hour  Intake 3003.75 ml  Output      0 ml  Net 3003.75 ml   Filed Weights   01/17/16 1146  Weight: 150.8 kg (332 lb 7.3 oz)    Exam:   General: afebrile, no CP. Patient with intermittent episodes of A. Flutter with RVR overnight; no nausea or vomiting. Reports not eating as he is afraid of experiencing pain.  Cardiovascular: irregular, no rubs or gallops  Respiratory: good air movement, no wheezing or crackles  Abdomen: obese, with mild epigastric tenderness on deep palpation; positive BS  Musculoskeletal: no cyanosis or clubbing; trace LE edema bilaterally  Data Reviewed: Basic Metabolic Panel:  Recent Labs Lab 01/17/16 0603 01/18/16 0523  NA 140 139  K 4.3 4.4  CL 111 108  CO2 23 26  GLUCOSE  100* 95  BUN 18 9  CREATININE 0.87 0.77  CALCIUM 8.6* 8.1*   Liver Function Tests:  Recent Labs Lab 01/17/16 0603 01/18/16 0523  AST 21 16  ALT 26 20  ALKPHOS 34* 29*   BILITOT 0.6 1.1  PROT 6.9 6.3*  ALBUMIN 3.9 3.5    Recent Labs Lab 01/17/16 0603  LIPASE 1745*   CBC:  Recent Labs Lab 01/17/16 0603 01/18/16 0523  WBC 16.8* 12.2*  NEUTROABS 12.6*  --   HGB 13.9 12.1*  HCT 41.7 36.9*  MCV 88.0 88.3  PLT 315 264   Studies: Ct Abdomen Pelvis W Contrast  01/17/2016  CLINICAL DATA:  Intermittent mid abdominal pain, history of pancreatitis EXAM: CT ABDOMEN AND PELVIS WITH CONTRAST TECHNIQUE: Multidetector CT imaging of the abdomen and pelvis was performed using the standard protocol following bolus administration of intravenous contrast. CONTRAST:  ISOVUE-300 IOPAMIDOL (ISOVUE-300) INJECTION 61% COMPARISON:  None. FINDINGS: Lower chest: Mild patchy opacities at the lung bases, likely atelectasis. Hepatobiliary: Mild hepatic steatosis. Gallbladder is unremarkable. No intrahepatic or extrahepatic ductal dilatation. Pancreas: Peripancreatic inflammatory changes/stranding, suggesting acute on chronic pancreatitis. Bilobed, mildly thick-walled fluid collection/pseudocyst adjacent to the pancreatic head and inferior to the antral pyloric region of the stomach, measuring approximately 3.6 x 4.9 x 4.7 cm (series 2/ image 29). Spleen: Within normal limits. Adrenals/Urinary Tract: Left adrenal gland is mildly thickened, without discrete mass. Right adrenal gland is within normal limits. Kidneys are within normal limits.  No hydronephrosis. Bladder is within normal limits. Stomach/Bowel: Stomach is within normal limits. No evidence of bowel obstruction. Normal appendix (series 2/ image 63). Vascular/Lymphatic: No evidence of abdominal aortic aneurysm. Numerous peripancreatic and jejunal mesentery lymph nodes, measuring up to 11 mm short axis (series 2/ image 40), likely reactive. Reproductive: Prostate is unremarkable. Other: No abdominopelvic ascites. Tiny fat containing right inguinal hernia (series 2/image 91). Musculoskeletal: Mild degenerative changes the  visualized thoracolumbar spine. IMPRESSION: Acute on chronic pancreatitis. Associated 4.9 cm pseudocyst adjacent to the pancreatic head and inferior to the antral pyloric region of the stomach. Mid abdominal/jejunal mesentery lymphadenopathy, favored to be reactive. Electronically Signed   By: Charline Bills M.D.   On: 01/17/2016 07:59    Scheduled Meds: . folic acid  1 mg Oral Daily  . metoprolol  2.5 mg Intravenous Once  . metoprolol tartrate  12.5 mg Oral BID  . multivitamin with minerals  1 tablet Oral Daily  . nicotine  21 mg Transdermal Daily  . sodium chloride flush  3 mL Intravenous Q12H  . thiamine  100 mg Oral Daily   Or  . thiamine  100 mg Intravenous Daily  . traMADol  50 mg Oral Q6H   Continuous Infusions: . sodium chloride 150 mL/hr at 01/18/16 4098    Principal Problem:   SIRS without infection or organ dysfunction [facility]) Active Problems:   Acute pancreatitis   Obesity, BMI> 40   Snores   Former smoker   EtOH dependence (HCC)   Pancreatitis   Atrial flutter (HCC)    Time spent: 30 minutes    Vassie Loll  [facility] Pager [phone]. If 7PM-7AM, please contact night-coverage at [email or web address], password [facility] 01/18/2016, 8:10 AM  LOS: 1 day

## 2016-01-19 DIAGNOSIS — I5021 Acute systolic (congestive) heart failure: Secondary | ICD-10-CM

## 2016-01-19 DIAGNOSIS — F1010 Alcohol abuse, uncomplicated: Secondary | ICD-10-CM

## 2016-01-19 LAB — BRAIN NATRIURETIC PEPTIDE: B Natriuretic Peptide: 146.9 pg/mL — ABNORMAL HIGH (ref 0.0–100.0)

## 2016-01-19 MED ORDER — METOPROLOL TARTRATE 5 MG/5ML IV SOLN
5.0000 mg | Freq: Once | INTRAVENOUS | Status: AC
  Administered 2016-01-19: 5 mg via INTRAVENOUS
  Filled 2016-01-19: qty 5

## 2016-01-19 MED ORDER — METOPROLOL TARTRATE 25 MG PO TABS
37.5000 mg | ORAL_TABLET | Freq: Two times a day (BID) | ORAL | Status: DC
  Administered 2016-01-19 – 2016-01-21 (×4): 37.5 mg via ORAL
  Filled 2016-01-19 (×4): qty 2

## 2016-01-19 MED ORDER — NITROGLYCERIN 0.4 MG SL SUBL
SUBLINGUAL_TABLET | SUBLINGUAL | Status: AC
  Administered 2016-01-19: 0.4 mg via SUBLINGUAL
  Filled 2016-01-19: qty 1

## 2016-01-19 MED ORDER — METOPROLOL TARTRATE 5 MG/5ML IV SOLN
5.0000 mg | Freq: Once | INTRAVENOUS | Status: AC
  Administered 2016-01-19: 5 mg via INTRAVENOUS

## 2016-01-19 MED ORDER — ACETAMINOPHEN 325 MG PO TABS
650.0000 mg | ORAL_TABLET | ORAL | Status: DC | PRN
  Administered 2016-01-19: 650 mg via ORAL
  Filled 2016-01-19: qty 2

## 2016-01-19 MED ORDER — NITROGLYCERIN 0.4 MG SL SUBL
0.4000 mg | SUBLINGUAL_TABLET | Freq: Once | SUBLINGUAL | Status: AC
  Administered 2016-01-19 (×2): 0.4 mg via SUBLINGUAL

## 2016-01-19 MED ORDER — PANTOPRAZOLE SODIUM 40 MG PO TBEC
40.0000 mg | DELAYED_RELEASE_TABLET | Freq: Two times a day (BID) | ORAL | Status: DC
  Administered 2016-01-19 – 2016-01-21 (×5): 40 mg via ORAL
  Filled 2016-01-19 (×6): qty 1

## 2016-01-19 MED ORDER — METOPROLOL TARTRATE 25 MG PO TABS
25.0000 mg | ORAL_TABLET | Freq: Two times a day (BID) | ORAL | Status: DC
  Administered 2016-01-19: 25 mg via ORAL
  Filled 2016-01-19: qty 1

## 2016-01-19 NOTE — Progress Notes (Signed)
[facility] PROGRESS NOTE  Nathaniel [name] HQI:[id number] DOB: [birth year] DOA: 01/17/2016 PCP: No PCP Per Patient  Brief summary and H&P 24y/o with past medical hx significant for alcohol abuse, former tobacco abuse and pancreatitis. Admitted secondary to abd pain and another burst of Pancreatitis (after alcohol intake [place] Patient was found to have SIRS (with pseudocyst in CT abdomen; no fever) and ended experiencing A. Flutter/A. Fib with RVR.  2-D echo demonstrated EF 35-40%; no CP, no palpitations. Cardiology consulted.  Assessment/Plan: 1-acute on chronic pancreatitis: secondary to alcohol consumption  -pseudocyst appreciated on CT abd on admission (will monitor intermittently) -no fever, no SOB -WBC's trending down/WNL essentially -pain improving; but still with anorexia and difficulty tolerating PO -reports no N/V -continue bowel rest and slowly advancing diet -will repeat lipase level in am -no abx's needed at this point -alcohol cessation counseling provided  2-SIRS: improving/resolving -associated with problem #1  3-atrial flutter/A. Fib -CHADsVASC score 1 (heart failure) -cardiology on board, will follow rec's -metoprolol for rate control -TSH and Mg WNL -once stable from pancreatitis, plan is to start anticoagulation and follow as an outpatient for cardioversion   4-acute systolic heart failure: EF 35-40% -secondary to arrhythmias and alcohol most likely  -b-blocker and ACE inhibitor added -low sodium diet recommended/discussed -will follow daily weights and strict intake and output  -BNP 146, but no signs of fluid overload on exam and per cardiology most likely will need aldactone prior to discharge; will follow rec's  5-dyslipidemia: -HDL 37 -advise to follow fisch oil as an outpatient -LDL 64  6-obesity: -Body mass index is 44.87 kg/(m^2). -low calorie diet, exercise and if needed evaluation by bariatric surgery as an  outpatient -patient was very receptive to message and looking to start lifestyle changes    Code Status: Full code Family Communication: Fiancee at bedside Disposition Plan: remains inpatient; continue fluid resuscitation (at slower rate) and slowly advance diet as tolerated. Still with pain and not able to eat much. Patient also with intermittent episodes of A. Flutter. Will follow cardiology rec's   Consultants:  Cardiology service  Procedures:  2-D echo: - Left ventricle: The cavity size was normal. Systolic function was  moderately reduced. The estimated ejection fraction was in the  range of 35% to 40%. Moderate diffuse hypokinesis. Regional wall  motion abnormalities cannot be excluded. - Left atrium: The atrium was mildly dilated.  Antibiotics:  None  HPI/Subjective: Afebrile, mild epigastric/lower chest discomfort, no SOB and reports no palpitations sensation. continue to experience  A. Fib/Flutter episodes and is still unable to tolerate diet  Objective: Filed Vitals:   01/19/16 1447 01/19/16 1956  BP: 108/59 110/67  Pulse: 95 156  Temp: 99.1 F (37.3 C) 100.5 F (38.1 C)  Resp:      Intake/Output Summary (Last 24 hours) at 01/19/16 2110 Last data filed at 01/19/16 1449  Gross per 24 hour  Intake    120 ml  Output    400 ml  Net   -280 ml   Filed Weights   01/17/16 1146 01/19/16 0607  Weight: 150.8 kg (332 lb 7.3 oz) 150.1 kg (330 lb 14.6 oz)    Exam:   General: afebrile, with mild episode of mid chest pain. Patient continue experiencing intermittent episodes of A. Flutter with RVR overnight; no nausea or vomiting. Reports having pain after attempting CLD.   Cardiovascular: irregular, no rubs or gallops  Respiratory: good air movement, no wheezing or crackles  Abdomen: obese, with mild epigastric  tenderness on deep palpation; positive BS  Musculoskeletal: no cyanosis or clubbing; trace LE edema bilaterally  Data Reviewed: Basic Metabolic  Panel:  Recent Labs Lab 01/17/16 0603 01/18/16 0523 01/18/16 0902  NA 140 139  --   K 4.3 4.4  --   CL 111 108  --   CO2 23 26  --   GLUCOSE 100* 95  --   BUN 18 9  --   CREATININE 0.87 0.77  --   CALCIUM 8.6* 8.1*  --   MG  --   --  1.8   Liver Function Tests:  Recent Labs Lab 01/17/16 0603 01/18/16 0523  AST 21 16  ALT 26 20  ALKPHOS 34* 29*  BILITOT 0.6 1.1  PROT 6.9 6.3*  ALBUMIN 3.9 3.5    Recent Labs Lab 01/17/16 0603 01/18/16 0902  LIPASE 1745* 645*   CBC:  Recent Labs Lab 01/17/16 0603 01/18/16 0523  WBC 16.8* 12.2*  NEUTROABS 12.6*  --   HGB 13.9 12.1*  HCT 41.7 36.9*  MCV 88.0 88.3  PLT 315 264   Studies: No results found.  Scheduled Meds: . folic acid  1 mg Oral Daily  . lisinopril  5 mg Oral Daily  . metoprolol tartrate  37.5 mg Oral BID  . multivitamin with minerals  1 tablet Oral Daily  . nicotine  21 mg Transdermal Daily  . pantoprazole  40 mg Oral BID  . sodium chloride flush  3 mL Intravenous Q12H  . thiamine  100 mg Oral Daily  . traMADol  50 mg Oral Q6H   Continuous Infusions: . sodium chloride 100 mL/hr at 01/19/16 1645    Principal Problem:   SIRS without infection or organ dysfunction (HCC) Active Problems:   Acute pancreatitis   Obesity, BMI> 40   Snores   Former smoker   EtOH dependence (HCC)   Pancreatitis   Atrial flutter (HCC)   Morbid obesity due to excess calories (HCC)   Alcohol abuse   Acute on chronic pancreatitis (HCC)   Dyslipidemia   Acute systolic CHF (congestive heart failure) (HCC)    Time spent: 30 minutes    Vassie Loll  [facility] Pager [phone]. If 7PM-7AM, please contact night-coverage at [email or web address], password [facility] 01/19/2016, 9:10 PM  LOS: 2 days

## 2016-01-19 NOTE — Progress Notes (Signed)
Pt HR sustaining in 150-160's with intermittent A-flutter/ST. Patient asymptomatic. VSS. On call Cardiologist [facility] paged. New order placed.  Will continue to monitor closely

## 2016-01-19 NOTE — Progress Notes (Addendum)
Patient c/o of chest pain in upper left side, radiating up from abdomen.  Patient says that pain is sharp in nature, and gone by the time RN entered the room.  Patient had been moving from chair back to bed with assistance from fiance who is at bedside. Patient vitals 125/75, 02 sats 96% on 2L, HR 160s.  Patient has been in and out of flutter/nsr all morning.  Dr. Gwenlyn Perking on floor and aware of this during rounds this morning. Dr. Gwenlyn Perking notified of patient's c/o chest pain, new orders received. Cardiology paged, new orders received from [facility].  2 doses SL Nitro given, additional 12.5mg  PO of metoprolol, and 5 mg IV metoprolol.  Patient's CP has resolved, HR continues to be in the 150s/160s.  Will continue to monitor patient closely.

## 2016-01-19 NOTE — Progress Notes (Signed)
Subjective: No SOB or dizziness.  He had some sharp CP earlier which was worse with inspiration.    Objective: Vital signs in last 24 hours: Temp:  [99.2 F (37.3 C)-100.2 F (37.9 C)] 99.8 F (37.7 C) (05/22 0607) Pulse Rate:  [92-164] 163 (05/22 1002) Resp:  [18-20] 20 (05/22 0607) BP: (102-140)/(52-121) 125/75 mmHg (05/22 1002) SpO2:  [94 %-97 %] 97 % (05/22 0607) Weight:  [330 lb 14.6 oz (150.1 kg)] 330 lb 14.6 oz (150.1 kg) (05/22 0607) Last BM Date: 01/17/16  Intake/Output from previous day: 05/21 0701 - 05/22 0700 In: 1550 [I.V.:1550] Out: 400 [Urine:400] Intake/Output this shift:    Medications Scheduled Meds: . folic acid  1 mg Oral Daily  . lisinopril  5 mg Oral Daily  . metoprolol  2.5 mg Intravenous Once  . metoprolol tartrate  25 mg Oral BID  . multivitamin with minerals  1 tablet Oral Daily  . nicotine  21 mg Transdermal Daily  . nitroGLYCERIN      . pantoprazole  40 mg Oral BID  . sodium chloride flush  3 mL Intravenous Q12H  . thiamine  100 mg Oral Daily  . traMADol  50 mg Oral Q6H   Continuous Infusions: . sodium chloride 125 mL/hr at 01/19/16 0811   PRN Meds:.HYDROmorphone (DILAUDID) injection, LORazepam **OR** LORazepam, ondansetron, oxyCODONE-acetaminophen  PE: General appearance: alert, cooperative and no distress Neck: no JVD Lungs: right basilar crackles Heart: irregularly irregular rhythm and No MM Extremities: No LEE Pulses: 2+ and symmetric Skin: Warm and dry Neurologic: Grossly normal  Lab Results:   Recent Labs  01/17/16 0603 01/18/16 0523  WBC 16.8* 12.2*  HGB 13.9 12.1*  HCT 41.7 36.9*  PLT 315 264   BMET  Recent Labs  01/17/16 0603 01/18/16 0523  NA 140 139  K 4.3 4.4  CL 111 108  CO2 23 26  GLUCOSE 100* 95  BUN 18 9  CREATININE 0.87 0.77  CALCIUM 8.6* 8.1*   PT/INR  Recent Labs  01/18/16 0523  LABPROT 15.1  INR 1.22   Cholesterol  Recent Labs  01/17/16 1238  CHOL 128   Lipid Panel       Component Value Date/Time   CHOL 128 01/17/2016 1238   TRIG 132 01/17/2016 1238   HDL 37* 01/17/2016 1238   CHOLHDL 3.5 01/17/2016 1238   VLDL 26 01/17/2016 1238   LDLCALC 65 01/17/2016 1238   Echo cardiogram Study Conclusions  - Left ventricle: The cavity size was normal. Systolic function was  moderately reduced. The estimated ejection fraction was in the  range of 35% to 40%. Moderate diffuse hypokinesis. Regional wall  motion abnormalities cannot be excluded. - Left atrium: The atrium was mildly dilated.  Assessment/Plan  Principal Problem:   SIRS without infection or organ dysfunction (HCC) Active Problems:   Acute pancreatitis   Obesity, BMI> 40   Snores   Former smoker   EtOH dependence (HCC)   Pancreatitis   Atrial flutter (HCC)   Morbid obesity due to excess calories (HCC)   Alcohol abuse   Acute on chronic pancreatitis (HCC)   Dyslipidemia   Acute systolic CHF (congestive heart failure) (HCC)  Flutter:  Rate 160's on lopressor 12.5mg .  His dose was increased this morning to 25mg  BID and he received the extra 12.5.  The RN also gave him the 5mg  IV I ordered at 1035hrs.  His rate has improved and he is going in and out of flutter.  Currently in  Sinus with rate of 85.  BP is stable.  He responds well to the lopressor and likely just needs a larger dose.  Will increase to 37.5 BID with next dose this evening and continue to titrate as BP will allow.  Avoid cardizem with reduced EF.   Will need anticoagulation once inflammation from pancreatitis resolved. OP monitor and may need to see EP in the future.   DCM:  Echo with decreased EF 35-40%.  Likely non ischemic and rate related given that he was in rapid flutter.  Mild basilar crackles on exam but no obvious JVD or LEE.  Check BNP consider adding aldactone prior to d/c .   On ACE.   Acute systolic heart failure Net fluids:  Not accurate.  Weight down 2#. (330lbs).  On lisinopril   Pancreatitis: With  large pseudocyst still not taking PO well Discussed ETOH cessation plan per GI   LOS: 2 days    Wilburt Finlay PA-C 01/19/2016 10:22 AM

## 2016-01-20 ENCOUNTER — Inpatient Hospital Stay (HOSPITAL_COMMUNITY): Payer: BLUE CROSS/BLUE SHIELD

## 2016-01-20 ENCOUNTER — Encounter (HOSPITAL_COMMUNITY): Payer: Self-pay | Admitting: Radiology

## 2016-01-20 DIAGNOSIS — I4892 Unspecified atrial flutter: Secondary | ICD-10-CM

## 2016-01-20 DIAGNOSIS — I5022 Chronic systolic (congestive) heart failure: Secondary | ICD-10-CM | POA: Insufficient documentation

## 2016-01-20 DIAGNOSIS — I5041 Acute combined systolic (congestive) and diastolic (congestive) heart failure: Secondary | ICD-10-CM

## 2016-01-20 DIAGNOSIS — K863 Pseudocyst of pancreas: Secondary | ICD-10-CM | POA: Insufficient documentation

## 2016-01-20 LAB — CBC
HCT: 37.7 % — ABNORMAL LOW (ref 39.0–52.0)
Hemoglobin: 12.3 g/dL — ABNORMAL LOW (ref 13.0–17.0)
MCH: 29.4 pg (ref 26.0–34.0)
MCHC: 32.6 g/dL (ref 30.0–36.0)
MCV: 90 fL (ref 78.0–100.0)
Platelets: 252 10*3/uL (ref 150–400)
RBC: 4.19 MIL/uL — ABNORMAL LOW (ref 4.22–5.81)
RDW: 13.2 % (ref 11.5–15.5)
WBC: 10.6 10*3/uL — ABNORMAL HIGH (ref 4.0–10.5)

## 2016-01-20 LAB — BASIC METABOLIC PANEL
Anion gap: 6 (ref 5–15)
BUN: 8 mg/dL (ref 6–20)
CO2: 27 mmol/L (ref 22–32)
Calcium: 8 mg/dL — ABNORMAL LOW (ref 8.9–10.3)
Chloride: 106 mmol/L (ref 101–111)
Creatinine, Ser: 0.64 mg/dL (ref 0.61–1.24)
GFR calc Af Amer: >60 mL/min (ref >60)
GFR calc non Af Amer: >60 mL/min (ref >60)
Glucose, Bld: 86 mg/dL (ref 65–99)
Potassium: 3.8 mmol/L (ref 3.5–5.1)
Sodium: 139 mmol/L (ref 135–145)

## 2016-01-20 LAB — LIPASE, BLOOD: Lipase: 95 U/L — ABNORMAL HIGH (ref 11–51)

## 2016-01-20 MED ORDER — AMIODARONE LOAD VIA INFUSION
150.0000 mg | Freq: Once | INTRAVENOUS | Status: DC
  Filled 2016-01-20: qty 83.34

## 2016-01-20 MED ORDER — AMIODARONE HCL IN DEXTROSE 360-4.14 MG/200ML-% IV SOLN
30.0000 mg/h | INTRAVENOUS | Status: DC
Start: 2016-01-21 — End: 2016-01-21

## 2016-01-20 MED ORDER — DOCUSATE SODIUM 50 MG PO CAPS
50.0000 mg | ORAL_CAPSULE | Freq: Two times a day (BID) | ORAL | Status: DC
  Administered 2016-01-20 – 2016-01-26 (×9): 50 mg via ORAL
  Filled 2016-01-20 (×15): qty 1

## 2016-01-20 MED ORDER — AMIODARONE HCL IN DEXTROSE 360-4.14 MG/200ML-% IV SOLN
60.0000 mg/h | INTRAVENOUS | Status: DC
Start: 2016-01-20 — End: 2016-01-21

## 2016-01-20 MED ORDER — OXYCODONE-ACETAMINOPHEN 5-325 MG PO TABS
1.0000 | ORAL_TABLET | ORAL | Status: DC | PRN
  Administered 2016-01-20 – 2016-01-21 (×5): 2 via ORAL
  Filled 2016-01-20 (×5): qty 2

## 2016-01-20 MED ORDER — POLYETHYLENE GLYCOL 3350 17 G PO PACK
17.0000 g | PACK | Freq: Every day | ORAL | Status: DC
  Administered 2016-01-20: 17 g via ORAL
  Filled 2016-01-20 (×7): qty 1

## 2016-01-20 MED ORDER — METOPROLOL TARTRATE 5 MG/5ML IV SOLN
2.5000 mg | INTRAVENOUS | Status: DC | PRN
  Administered 2016-01-20 (×2): 2.5 mg via INTRAVENOUS
  Filled 2016-01-20 (×3): qty 5

## 2016-01-20 MED ORDER — HYDROMORPHONE HCL 1 MG/ML IJ SOLN
1.0000 mg | Freq: Four times a day (QID) | INTRAMUSCULAR | Status: DC | PRN
  Administered 2016-01-20: 1 mg via INTRAVENOUS
  Filled 2016-01-20: qty 1

## 2016-01-20 MED ORDER — IOPAMIDOL (ISOVUE-300) INJECTION 61%
100.0000 mL | Freq: Once | INTRAVENOUS | Status: AC | PRN
  Administered 2016-01-20: 100 mL via INTRAVENOUS

## 2016-01-20 NOTE — Progress Notes (Signed)
Patient ambulated around unit a few times.  Patient HR remained stable in sinus rhythm and patient states "I feel good".  Will continue to monitor.

## 2016-01-20 NOTE — Progress Notes (Signed)
Patient Name: Nathaniel [name] Date of Encounter: 01/20/2016  Principal Problem:   SIRS without infection or organ dysfunction [facility]) Active Problems:   Acute pancreatitis   Obesity, BMI> 40   Snores   Former smoker   EtOH dependence (HCC)   Pancreatitis   Atrial flutter (HCC)   Morbid obesity due to excess calories (HCC)   Alcohol abuse   Acute on chronic pancreatitis (HCC)   Dyslipidemia   Acute systolic CHF (congestive heart failure) [facility]   Primary Cardiologist: New Patient Profile: 25 yo male w/ hx pancreatitis, ETOH use, admitted 05/20 w/ recurrent pancreatits, cards consulted for atrial flutter, RVR. EF 35-40% by echo  SUBJECTIVE: Breathing OK, hopes to get off IVF today. Never had palpitations  OBJECTIVE Filed Vitals:   01/19/16 2323 01/20/16 0003 01/20/16 0508 01/20/16 0510  BP: 126/82 112/59 126/83   Pulse: 160 81 72   Temp:  98.4 F (36.9 C) 98 F (36.7 C)   TempSrc:  Oral Oral   Resp:  18 18   Height:      Weight:    331 lb 5.6 oz (150.3 kg)  SpO2:  98% 99%     Intake/Output Summary (Last 24 hours) at 01/20/16 0951 Last data filed at 01/20/16 3419  Gross per 24 hour  Intake 1616.67 ml  Output    300 ml  Net 1316.67 ml   Filed Weights   01/17/16 1146 01/19/16 0607 01/20/16 0510  Weight: 332 lb 7.3 oz (150.8 kg) 330 lb 14.6 oz (150.1 kg) 331 lb 5.6 oz (150.3 kg)    PHYSICAL EXAM General: Well developed, well nourished, male in no acute distress. Head: Normocephalic, atraumatic.  Neck: Supple without bruits, JVD not seen well. Lungs:  Resp regular and unlabored, rales bases. Heart: RRR, S1, S2, no S3, S4, or murmur; no rub. Abdomen: Soft, non-tender, non-distended, BS + x 4.  Extremities: No clubbing, cyanosis, edema.  Neuro: Alert and oriented X 3. Moves all extremities spontaneously. Psych: Normal affect.  LABS: CBC: Recent Labs  01/18/16 0523 01/20/16 0504  WBC 12.2* 10.6*  HGB 12.1* 12.3*  HCT 36.9* 37.7*  MCV 88.3 90.0  PLT  264 252   INR: Recent Labs  01/18/16 0523  INR 1.22   Basic Metabolic Panel: Recent Labs  01/18/16 0523 01/18/16 0902 01/20/16 0504  NA 139  --  139  K 4.4  --  3.8  CL 108  --  106  CO2 26  --  27  GLUCOSE 95  --  86  BUN 9  --  8  CREATININE 0.77  --  0.64  CALCIUM 8.1*  --  8.0*  MG  --  1.8  --    Liver Function Tests: Recent Labs  01/18/16 0523  AST 16  ALT 20  ALKPHOS 29*  BILITOT 1.1  PROT 6.3*  ALBUMIN 3.5   BNP:  B NATRIURETIC PEPTIDE  Date/Time Value Ref Range Status  01/19/2016 01:06 PM 146.9* 0.0 - 100.0 pg/mL Final   Fasting Lipid Panel: Recent Labs  01/17/16 1238  CHOL 128  HDL 37*  LDLCALC 65  TRIG 379  CHOLHDL 3.5   Thyroid Function Tests: Recent Labs  01/18/16 0902  TSH 1.653    TELE: SR overnight with frequent PVCs, trigeminy at times        Radiology/Studies: No results found.   Current Medications:  . folic acid  1 mg Oral Daily  . lisinopril  5 mg Oral Daily  .  metoprolol tartrate  37.5 mg Oral BID  . multivitamin with minerals  1 tablet Oral Daily  . nicotine  21 mg Transdermal Daily  . pantoprazole  40 mg Oral BID  . sodium chloride flush  3 mL Intravenous Q12H  . thiamine  100 mg Oral Daily  . traMADol  50 mg Oral Q6H   . sodium chloride 100 mL/hr at 01/20/16 0235    ASSESSMENT AND PLAN: 1. Atrial Flutter, RVR:  Rate 160's on lopressor 12.5mg . Lopressor uptitrated to 37.5 mg bid, also w/  IV prn.  His rate has improved and he is going in and out of flutter, now mostly SR. BP is stable.  He responds well to the lopressor Continue to titrate Lopressor as BP will allow, not sure he will tolerate any more. Avoid cardizem with reduced EF.   Will need anticoagulation once inflammation from pancreatitis resolved. OP monitor and may need to see EP in the future.   2. DCM:  Echo with decreased EF 35-40%. Likely non ischemic and rate-related given that he was in rapid flutter of unknown duration. Mild  basilar crackles on exam but no obvious JVD or LEE. Check BNP consider adding aldactone prior to d/c . On ACE.  CK CXR  3. Acute systolic heart failure Net fluids: Not accurate. Weight down 2#. (330lbs). On lisinopril . Since he can increase po intake, will d/c IVF 100 cc/hr  4. Pancreatitis: With large pseudocyst, plan per IM/GI. Diet has been advanced  Otherwise, per IM Principal Problem:   SIRS without infection or organ dysfunction (HCC) Active Problems:   Acute pancreatitis   Obesity, BMI> 40   Snores   Former smoker   EtOH dependence (HCC)   Pancreatitis   Atrial flutter (HCC)   Morbid obesity due to excess calories (HCC)   Alcohol abuse   Acute on chronic pancreatitis (HCC)   Dyslipidemia   Acute systolic CHF (congestive heart failure) (HCC)   Signed, Theodore Demark , PA-C 9:51 AM 01/20/2016

## 2016-01-20 NOTE — Progress Notes (Signed)
[facility] PROGRESS NOTE  Franz Svec NUU:[id number] DOB: [birth year] DOA: 01/17/2016 PCP: No PCP Per Patient  Brief summary and H&P 25y/o with past medical hx significant for alcohol abuse, former tobacco abuse and pancreatitis. Admitted secondary to abd pain and another burst of Pancreatitis (after alcohol intake while [place]). Patient was found to have SIRS (with pseudocyst in CT abdomen; no fever) and ended experiencing A. Flutter/A. Fib with RVR.  2-D echo demonstrated EF 35-40%; no CP, still asymptomatic/intermittent episodes of A.fib/a. flutter. Cardiology consulted.  Assessment/Plan: 1-acute on chronic pancreatitis: secondary to alcohol consumption  -pseudocyst appreciated on CT abd on admission (will monitor intermittently) -no fever, no SOB -WBC's trending down/WNL essentially -pain significantly improved; will mainly use PO analgesics and advance diet  -lipase down to 95 and patient willing to advance diet further; no nausea or vomiting  -no abx's needed at this point; but given low grade temp with reassess pseudocyst  -alcohol cessation counseling provided  2-SIRS: improving/resolving -associated with problem #1  3-atrial flutter/A. Fib -CHADsVASC score 1 (heart failure) -cardiology on board, will follow rec's -metoprolol for rate control -TSH and Mg WNL -once stable from pancreatitis, plan is to start anticoagulation and follow as an outpatient for cardioversion   4-acute systolic heart failure: EF 35-40% -secondary to arrhythmias and alcohol most likely  -b-blocker and ACE inhibitor added -low sodium diet recommended/discussed -will follow daily weights and strict intake and output  -BNP 146 on 5/22, but no signs of fluid overload on exam. As per cardiology most likely will need aldactone prior to discharge; will follow rec's  5-dyslipidemia: -HDL 37 -advise to follow fisch oil as an outpatient -LDL 64  6-obesity: -Body mass index is  44.93 kg/(m^2). -low calorie diet, exercise and if needed evaluation by bariatric surgery as an outpatient -patient was very receptive to message and looking to start lifestyle changes    Code Status: Full code Family Communication: Fiancee at bedside Disposition Plan: remains inpatient; slowly advance diet as tolerated. Given low grade temp will repeat CT abd; still holding on abx's as pain is much better and WBC's down. Patient continue to have intermittent episodes of A. Flutter (even less), Will follow cardiology rec's   Consultants:  Cardiology service  Procedures:  2-D echo: - Left ventricle: The cavity size was normal. Systolic function was  moderately reduced. The estimated ejection fraction was in the  range of 35% to 40%. Moderate diffuse hypokinesis. Regional wall  motion abnormalities cannot be excluded. - Left atrium: The atrium was mildly dilated.  Antibiotics:  None  HPI/Subjective: Low grade temp overnight; reports improvement in his abd pain; denies CP, nausea, vomiting and palpitations. Continue to experience  A. Fib/Flutter episodes.  Objective: Filed Vitals:   01/20/16 0003 01/20/16 0508  BP: 112/59 126/83  Pulse: 81 72  Temp: 98.4 F (36.9 C) 98 F (36.7 C)  Resp: 18 18    Intake/Output Summary (Last 24 hours) at 01/20/16 0933 Last data filed at 01/20/16 3474  Gross per 24 hour  Intake 1616.67 ml  Output    300 ml  Net 1316.67 ml   Filed Weights   01/17/16 1146 01/19/16 0607 01/20/16 0510  Weight: 150.8 kg (332 lb 7.3 oz) 150.1 kg (330 lb 14.6 oz) 150.3 kg (331 lb 5.6 oz)    Exam:   General: low grade temp overnight; reports improvement overall in abd pain and ability to eat. Will like to try food with more consistency today. Continue experiencing intermittent episodes of  A. Flutter with RVR overnight; no nausea or vomiting.   Cardiovascular: regular rate currently; no rubs or gallops; no JVD appreciated on exam  Respiratory: good  air movement, no wheezing or crackles  Abdomen: obese, with very mild epigastric tenderness on deep palpation; positive BS  Musculoskeletal: no cyanosis or clubbing; trace LE edema bilaterally  Data Reviewed: Basic Metabolic Panel:  Recent Labs Lab 01/17/16 0603 01/18/16 0523 01/18/16 0902 01/20/16 0504  NA 140 139  --  139  K 4.3 4.4  --  3.8  CL 111 108  --  106  CO2 23 26  --  27  GLUCOSE 100* 95  --  86  BUN 18 9  --  8  CREATININE 0.87 0.77  --  0.64  CALCIUM 8.6* 8.1*  --  8.0*  MG  --   --  1.8  --    Liver Function Tests:  Recent Labs Lab 01/17/16 0603 01/18/16 0523  AST 21 16  ALT 26 20  ALKPHOS 34* 29*  BILITOT 0.6 1.1  PROT 6.9 6.3*  ALBUMIN 3.9 3.5    Recent Labs Lab 01/17/16 0603 01/18/16 0902 01/20/16 0504  LIPASE 1745* 645* 95*   CBC:  Recent Labs Lab 01/17/16 0603 01/18/16 0523 01/20/16 0504  WBC 16.8* 12.2* 10.6*  NEUTROABS 12.6*  --   --   HGB 13.9 12.1* 12.3*  HCT 41.7 36.9* 37.7*  MCV 88.0 88.3 90.0  PLT 315 264 252   Studies: No results found.  Scheduled Meds: . folic acid  1 mg Oral Daily  . lisinopril  5 mg Oral Daily  . metoprolol tartrate  37.5 mg Oral BID  . multivitamin with minerals  1 tablet Oral Daily  . nicotine  21 mg Transdermal Daily  . pantoprazole  40 mg Oral BID  . sodium chloride flush  3 mL Intravenous Q12H  . thiamine  100 mg Oral Daily  . traMADol  50 mg Oral Q6H   Continuous Infusions: . sodium chloride 100 mL/hr at 01/20/16 0235    Principal Problem:   SIRS without infection or organ dysfunction (HCC) Active Problems:   Acute pancreatitis   Obesity, BMI> 40   Snores   Former smoker   EtOH dependence (HCC)   Pancreatitis   Atrial flutter (HCC)   Morbid obesity due to excess calories (HCC)   Alcohol abuse   Acute on chronic pancreatitis (HCC)   Dyslipidemia   Acute systolic CHF (congestive heart failure) (HCC)    Time spent: 30 minutes    Vassie Loll  [facility] Pager [phone]. If 7PM-7AM, please contact night-coverage at [email or web address], password [email or web address] 01/20/2016, 9:33 AM  LOS: 3 days

## 2016-01-21 DIAGNOSIS — I484 Atypical atrial flutter: Secondary | ICD-10-CM

## 2016-01-21 DIAGNOSIS — K859 Acute pancreatitis without necrosis or infection, unspecified: Secondary | ICD-10-CM

## 2016-01-21 DIAGNOSIS — K861 Other chronic pancreatitis: Secondary | ICD-10-CM

## 2016-01-21 LAB — BASIC METABOLIC PANEL
Anion gap: 9 (ref 5–15)
BUN: 8 mg/dL (ref 6–20)
CO2: 26 mmol/L (ref 22–32)
Calcium: 8.4 mg/dL — ABNORMAL LOW (ref 8.9–10.3)
Chloride: 105 mmol/L (ref 101–111)
Creatinine, Ser: 0.64 mg/dL (ref 0.61–1.24)
GFR calc Af Amer: >60 mL/min (ref >60)
GFR calc non Af Amer: >60 mL/min (ref >60)
Glucose, Bld: 95 mg/dL (ref 65–99)
Potassium: 3.8 mmol/L (ref 3.5–5.1)
Sodium: 140 mmol/L (ref 135–145)

## 2016-01-21 LAB — CBC
HCT: 36.1 % — ABNORMAL LOW (ref 39.0–52.0)
Hemoglobin: 12 g/dL — ABNORMAL LOW (ref 13.0–17.0)
MCH: 29.4 pg (ref 26.0–34.0)
MCHC: 33.2 g/dL (ref 30.0–36.0)
MCV: 88.5 fL (ref 78.0–100.0)
Platelets: 278 10*3/uL (ref 150–400)
RBC: 4.08 MIL/uL — ABNORMAL LOW (ref 4.22–5.81)
RDW: 12.9 % (ref 11.5–15.5)
WBC: 9.1 10*3/uL (ref 4.0–10.5)

## 2016-01-21 MED ORDER — METOPROLOL TARTRATE 5 MG/5ML IV SOLN
5.0000 mg | INTRAVENOUS | Status: DC | PRN
  Administered 2016-01-21 – 2016-01-22 (×3): 5 mg via INTRAVENOUS
  Filled 2016-01-21 (×2): qty 5

## 2016-01-21 MED ORDER — DEXTROSE-NACL 5-0.45 % IV SOLN
INTRAVENOUS | Status: DC
  Administered 2016-01-21 – 2016-01-23 (×4): via INTRAVENOUS
  Administered 2016-01-24: 125 mL/h via INTRAVENOUS

## 2016-01-21 MED ORDER — PANTOPRAZOLE SODIUM 40 MG PO TBEC
40.0000 mg | DELAYED_RELEASE_TABLET | Freq: Every day | ORAL | Status: DC
  Administered 2016-01-23: 40 mg via ORAL
  Filled 2016-01-21 (×2): qty 1

## 2016-01-21 MED ORDER — METOPROLOL TARTRATE 50 MG PO TABS
50.0000 mg | ORAL_TABLET | Freq: Two times a day (BID) | ORAL | Status: DC
  Administered 2016-01-21 – 2016-01-22 (×2): 50 mg via ORAL
  Filled 2016-01-21 (×2): qty 1

## 2016-01-21 MED ORDER — HYDROMORPHONE HCL 1 MG/ML IJ SOLN
1.0000 mg | INTRAMUSCULAR | Status: DC | PRN
  Administered 2016-01-21 – 2016-01-23 (×14): 1 mg via INTRAVENOUS
  Filled 2016-01-21 (×14): qty 1

## 2016-01-21 MED ORDER — METOPROLOL TARTRATE 5 MG/5ML IV SOLN
5.0000 mg | Freq: Four times a day (QID) | INTRAVENOUS | Status: DC
  Administered 2016-01-21: 5 mg via INTRAVENOUS
  Filled 2016-01-21: qty 5

## 2016-01-21 MED ORDER — METOPROLOL TARTRATE 25 MG PO TABS: 37.5000 mg | ORAL_TABLET | Freq: Two times a day (BID) | ORAL | Status: DC

## 2016-01-21 NOTE — Progress Notes (Signed)
[facility] PROGRESS NOTE    Progress Note  Nathaniel [name]  KTG:[id number] DOB: December 11, 1990 DOA: 01/17/2016 PCP: No PCP Per Patient     Brief Narrative:   Nathaniel [name] is an 25 y.o. male past medical history of significant alcohol abuse and alcoholic pancreatitis came into the hospital with abdominal pain diagnosed with pancreatitis and developed A. fib with RVR in house.  Assessment/Plan:   SIRS without infection or organ dysfunction due to Acute on chronic pancreatitis [facility]): CT of the abdomen and pelvis revealed pseudocyst which cirrhosis and 6 cm and is decreased in size. No fevers and leukocytosis has resolved. He relates pain with eating, will place him nothing by mouth start him on IV pain medication.  A. fib/A flutter: CHADsVASC score 0. Continue oral metoprolol, and IV when necessary. Question if this episode is due to pain. I switch him back to IV pain medication and place nothing by mouth. Likely due to alcohol withdrawals. TSH is 1.6 magnesium of 1.8. This probably  due to alcohol abuse. Cardiology was consulted. He is now a. flutter on EKG. We'll repeat his dose of metoprolol this morning if not improved, will need to consider amiodarone. Cruciate cardiology's assistance.  Acute systolic heart failure: With an EF of 40%, likely due to arrhythmia and alcohol abuse. Beta blocker was titrated up we'll continue his current dose of IV when necessary. He is currently on a beta blocker and low sodium diet. Continue daily weights, his BNP was mildly elevated at 146 no signs of fluid overload on physical exam.  Obesity, BMI> 40: Counseling.  Snores: Will need a sleep study as an outpatient.  EtOH dependence (HCC) No signs of withdrawal.   Dyslipidemia HDL 37 and LDL of 64, have recommended diet and exercise. Reevaluate in 6 months.  Pancreatic pseudocyst CT scan of the abdomen and pelvis show improved pseudocyst.   DVT prophylaxis: scd'S Family  Communication: Counseling Disposition Plan/Barrier to D/C: Needs to be tolerating diet Code Status:     Code Status Orders        Start     Ordered   01/17/16 1150  Full code   Continuous     01/17/16 1149    Code Status History    Date Active Date Inactive Code Status Order ID Comments User Context   This patient has a current code status but no historical code status.        IV Access:    Peripheral IV   Procedures and diagnostic studies:   Dg Chest 2 View  01/20/2016  CLINICAL DATA:  Congestive heart failure. EXAM: CHEST  2 VIEW COMPARISON:  None. FINDINGS: The heart size and mediastinal contours are within normal limits. No pneumothorax is noted. Mild bibasilar opacities are noted most consistent with subsegmental atelectasis. No significant pleural effusion is noted. The visualized skeletal structures are unremarkable. IMPRESSION: Mild bibasilar subsegmental atelectasis. Electronically Signed   By: Lupita Raider, M.D.   On: 01/20/2016 13:59   Ct Abdomen W Contrast  01/20/2016  CLINICAL DATA:  Epigastric pain for 3 days EXAM: CT ABDOMEN WITH CONTRAST TECHNIQUE: Multidetector CT imaging of the abdomen was performed using the standard protocol following bolus administration of intravenous contrast. CONTRAST:  ISOVUE-300 IOPAMIDOL (ISOVUE-300) INJECTION 61% COMPARISON:  01/17/2016 FINDINGS: Lower chest: Progressive atelectasis and consolidation identified within the lung bases, left greater than right. Small pleural effusions are noted, left greater than right. Hepatobiliary: No suspicious liver abnormalities identified. The gallbladder appears normal. There is no  biliary dilatation. Pancreas: There is diffuse edema involving the pancreas with surrounding fat stranding compatible with acute pancreatitis. The fluid collection scratch set pseudocyst adjacent to the head of pancreas measures 3.4 by 2.5 by 5.8 cm peer on the previous exam this measured 4.9 x 3.6 x 5.5 cm. No new  pseudo cysts identified within the upper abdomen Spleen: Negative.  The splenic vein remains patent. Adrenals/Urinary Tract: The adrenal glands are unremarkable. Normal appearance of the kidneys. Stomach/Bowel: The stomach appears normal. No pathologic dilatation of the upper abdominal bowel loops. Vascular/Lymphatic: Normal appearance of the abdominal aorta. Numerous prominent upper abdominal lymph nodes are identified, likely reactive. Other: No ascites or focal fluid collections identified. Musculoskeletal: The visualized bony structures are unremarkable. IMPRESSION: 1. Changes of acute pancreatitis are again identified. 2. Cyst cyst adjacent to head of pancreas is again noted and appears decreased in volume from previous exam. No new fluid collections identified. 3. Worsening aeration to both lung bases with small bilateral pleural effusions. Electronically Signed   By: Signa Kell M.D.   On: 01/20/2016 14:04     Medical Consultants:    None.  Anti-Infectives:   None  Subjective:    [name]   Objective:    Filed Vitals:   01/20/16 2215 01/20/16 2319 01/21/16 0524 01/21/16 0754  BP:   109/69 128/78  Pulse: 159 162 69 132  Temp:   97.3 F (36.3 C)   TempSrc:   Oral   Resp:   18 18  Height:      Weight:      SpO2:   98%     Intake/Output Summary (Last 24 hours) at 01/21/16 0814 Last data filed at 01/21/16 0600  Gross per 24 hour  Intake   1745 ml  Output   1950 ml  Net   -205 ml   Filed Weights   01/17/16 1146 01/19/16 0607 01/20/16 0510  Weight: 150.8 kg (332 lb 7.3 oz) 150.1 kg (330 lb 14.6 oz) 150.3 kg (331 lb 5.6 oz)    Exam: General exam: In no acute distress. Respiratory system: Good air movement and clear to auscultation. Cardiovascular system: S1 & S2 heard, RRR. No JVD, murmurs, rubs, gallops or clicks.  Gastrointestinal system: Abdomen is soft with epigastric tenderness no rebound or guarding. Central nervous system: Alert and oriented. No  focal neurological deficits. Extremities: No pedal edema. Skin: No rashes, lesions or ulcers Psychiatry: Judgement and insight appear normal. Mood & affect appropriate.    Data Reviewed:    Labs: Basic Metabolic Panel:  Recent Labs Lab 01/17/16 0603 01/18/16 0523 01/18/16 0902 01/20/16 0504 01/21/16 0523  NA 140 139  --  139 140  K 4.3 4.4  --  3.8 3.8  CL 111 108  --  106 105  CO2 23 26  --  27 26  GLUCOSE 100* 95  --  86 95  BUN 18 9  --  8 8  CREATININE 0.87 0.77  --  0.64 0.64  CALCIUM 8.6* 8.1*  --  8.0* 8.4*  MG  --   --  1.8  --   --    GFR Estimated Creatinine Clearance: 213 mL/min (by C-G formula based on Cr of 0.64). Liver Function Tests:  Recent Labs Lab 01/17/16 0603 01/18/16 0523  AST 21 16  ALT 26 20  ALKPHOS 34* 29*  BILITOT 0.6 1.1  PROT 6.9 6.3*  ALBUMIN 3.9 3.5    Recent Labs Lab 01/17/16 0603 01/18/16 0902 01/20/16  0504  LIPASE 1745* 645* 95*   No results for input(s): AMMONIA in the last 168 hours. Coagulation profile  Recent Labs Lab 01/18/16 0523  INR 1.22    CBC:  Recent Labs Lab 01/17/16 0603 01/18/16 0523 01/20/16 0504 01/21/16 0523  WBC 16.8* 12.2* 10.6* 9.1  NEUTROABS 12.6*  --   --   --   HGB 13.9 12.1* 12.3* 12.0*  HCT 41.7 36.9* 37.7* 36.1*  MCV 88.0 88.3 90.0 88.5  PLT 315 264 252 278   Cardiac Enzymes: No results for input(s): CKTOTAL, CKMB, CKMBINDEX, TROPONINI in the last 168 hours. BNP (last 3 results) No results for input(s): PROBNP in the last 8760 hours. CBG: No results for input(s): GLUCAP in the last 168 hours. D-Dimer: No results for input(s): DDIMER in the last 72 hours. Hgb A1c: No results for input(s): HGBA1C in the last 72 hours. Lipid Profile: No results for input(s): CHOL, HDL, LDLCALC, TRIG, CHOLHDL, LDLDIRECT in the last 72 hours. Thyroid function studies:  Recent Labs  01/18/16 0902  TSH 1.653   Anemia work up: No results for input(s): VITAMINB12, FOLATE, FERRITIN, TIBC,  IRON, RETICCTPCT in the last 72 hours. Sepsis Labs:  Recent Labs Lab 01/17/16 0603 01/18/16 0523 01/20/16 0504 01/21/16 0523  WBC 16.8* 12.2* 10.6* 9.1   Microbiology No results found for this or any previous visit (from the past 240 hour(s)).   Medications:   . amiodarone  150 mg Intravenous Once  . docusate sodium  50 mg Oral BID  . folic acid  1 mg Oral Daily  . lisinopril  5 mg Oral Daily  . metoprolol tartrate  37.5 mg Oral BID  . multivitamin with minerals  1 tablet Oral Daily  . nicotine  21 mg Transdermal Daily  . pantoprazole  40 mg Oral BID  . polyethylene glycol  17 g Oral Daily  . sodium chloride flush  3 mL Intravenous Q12H  . thiamine  100 mg Oral Daily  . traMADol  50 mg Oral Q6H   Continuous Infusions: . sodium chloride 50 mL/hr at 01/20/16 1513  . amiodarone      Time spent: 25 min   LOS: 4 days   Marinda Elk  [facility] Pager [phone]  *Please refer to [email or web address], password [email or web address] to get updated schedule on who will round on this patient, as hospitalists switch teams weekly. If 7PM-7AM, please contact night-coverage at [email or web address], password [email or web address] for any overnight needs.  01/21/2016, 8:14 AM

## 2016-01-21 NOTE — Progress Notes (Signed)
Patient Name: Nathaniel [name] Date of Encounter: 01/21/2016  Principal Problem:   SIRS without infection or organ dysfunction [facility]) Active Problems:   Acute pancreatitis   Obesity, BMI> 40   Snores   Former smoker   EtOH dependence (HCC)   Pancreatitis   Atrial flutter (HCC)   Morbid obesity due to excess calories (HCC)   Alcohol abuse   Acute on chronic pancreatitis (HCC)   Dyslipidemia   Acute systolic CHF (congestive heart failure) (HCC)   CHF (congestive heart failure) (HCC)   Pancreatic pseudocyst   Primary Cardiologist: New  Patient Profile: 25 yo male w/ hx pancreatitis, ETOH use, admitted 05/20 w/ recurrent pancreatits, cards consulted for atrial flutter, RVR. EF 35-40% by echo  SUBJECTIVE: Has abd pain with anything more than a sip of water. No chest pain. Abd pain makes him SOB, otherwise ok.  OBJECTIVE Filed Vitals:   01/20/16 2150 01/20/16 2215 01/20/16 2319 01/21/16 0524  BP: 112/74   109/69  Pulse: 166 159 162 69  Temp: 97.8 F (36.6 C)   97.3 F (36.3 C)  TempSrc: Oral   Oral  Resp: 19   18  Height:      Weight:      SpO2: 95%   98%    Intake/Output Summary (Last 24 hours) at 01/21/16 0753 Last data filed at 01/21/16 0600  Gross per 24 hour  Intake   1745 ml  Output   1950 ml  Net   -205 ml   Filed Weights   01/17/16 1146 01/19/16 0607 01/20/16 0510  Weight: 332 lb 7.3 oz (150.8 kg) 330 lb 14.6 oz (150.1 kg) 331 lb 5.6 oz (150.3 kg)    PHYSICAL EXAM General: Well developed, well nourished, male in no acute distress. Head: Normocephalic, atraumatic.  Neck: Supple without bruits, JVD not elevated. Lungs:  Resp regular and unlabored, few rales bases. Heart: Irreg R&R, S1, S2, no S3, S4, or murmur; no rub. Abdomen: Soft, tender, non-distended, BS + x 4.  Extremities: No clubbing, cyanosis, edema.  Neuro: Alert and oriented X 3. Moves all extremities spontaneously. Psych: Normal affect.  LABS: CBC: Recent Labs  01/20/16 0504  01/21/16 0523  WBC 10.6* 9.1  HGB 12.3* 12.0*  HCT 37.7* 36.1*  MCV 90.0 88.5  PLT 252 278   Basic Metabolic Panel: Recent Labs  01/18/16 0902 01/20/16 0504 01/21/16 0523  NA  --  139 140  K  --  3.8 3.8  CL  --  106 105  CO2  --  27 26  GLUCOSE  --  86 95  BUN  --  8 8  CREATININE  --  0.64 0.64  CALCIUM  --  8.0* 8.4*  MG 1.8  --   --    Lab Results  Component Value Date   ALT 20 01/18/2016   AST 16 01/18/2016   ALKPHOS 29* 01/18/2016   BILITOT 1.1 01/18/2016    Lab Results  Component Value Date   TSH 1.653 01/18/2016   BNP:  B NATRIURETIC PEPTIDE  Date/Time Value Ref Range Status  01/19/2016 01:06 PM 146.9* 0.0 - 100.0 pg/mL Final   Thyroid Function Tests: Recent Labs  01/18/16 0902  TSH 1.653    TELE:  SR, Afib, Aflutter. When out of rhythm, rate is elevated      Radiology/Studies: Dg Chest 2 View 01/20/2016  CLINICAL DATA:  Congestive heart failure. EXAM: CHEST  2 VIEW COMPARISON:  None. FINDINGS: The heart size and  mediastinal contours are within normal limits. No pneumothorax is noted. Mild bibasilar opacities are noted most consistent with subsegmental atelectasis. No significant pleural effusion is noted. The visualized skeletal structures are unremarkable. IMPRESSION: Mild bibasilar subsegmental atelectasis. Electronically Signed   By: Lupita Raider, M.D.   On: 01/20/2016 13:59   Ct Abdomen W Contrast 01/20/2016  CLINICAL DATA:  Epigastric pain for 3 days EXAM: CT ABDOMEN WITH CONTRAST TECHNIQUE: Multidetector CT imaging of the abdomen was performed using the standard protocol following bolus administration of intravenous contrast. CONTRAST:  ISOVUE-300 IOPAMIDOL (ISOVUE-300) INJECTION 61% COMPARISON:  01/17/2016 FINDINGS: Lower chest: Progressive atelectasis and consolidation identified within the lung bases, left greater than right. Small pleural effusions are noted, left greater than right. Hepatobiliary: No suspicious liver abnormalities  identified. The gallbladder appears normal. There is no biliary dilatation. Pancreas: There is diffuse edema involving the pancreas with surrounding fat stranding compatible with acute pancreatitis. The fluid collection scratch set pseudocyst adjacent to the head of pancreas measures 3.4 by 2.5 by 5.8 cm peer on the previous exam this measured 4.9 x 3.6 x 5.5 cm. No new pseudo cysts identified within the upper abdomen Spleen: Negative.  The splenic vein remains patent. Adrenals/Urinary Tract: The adrenal glands are unremarkable. Normal appearance of the kidneys. Stomach/Bowel: The stomach appears normal. No pathologic dilatation of the upper abdominal bowel loops. Vascular/Lymphatic: Normal appearance of the abdominal aorta. Numerous prominent upper abdominal lymph nodes are identified, likely reactive. Other: No ascites or focal fluid collections identified. Musculoskeletal: The visualized bony structures are unremarkable. IMPRESSION: 1. Changes of acute pancreatitis are again identified. 2. Cyst cyst adjacent to head of pancreas is again noted and appears decreased in volume from previous exam. No new fluid collections identified. 3. Worsening aeration to both lung bases with small bilateral pleural effusions. Electronically Signed   By: Signa Kell M.D.   On: 01/20/2016 14:04     Current Medications:  . amiodarone  150 mg Intravenous Once  . docusate sodium  50 mg Oral BID  . folic acid  1 mg Oral Daily  . lisinopril  5 mg Oral Daily  . metoprolol tartrate  37.5 mg Oral BID  . multivitamin with minerals  1 tablet Oral Daily  . nicotine  21 mg Transdermal Daily  . pantoprazole  40 mg Oral BID  . polyethylene glycol  17 g Oral Daily  . sodium chloride flush  3 mL Intravenous Q12H  . thiamine  100 mg Oral Daily  . traMADol  50 mg Oral Q6H   . sodium chloride 50 mL/hr at 01/20/16 1513  . amiodarone      ASSESSMENT AND PLAN: 1. Atrial Flutter, RVR:  He is going in and out of flutter. BP is  stable.  He responds well to the lopressor, but gets abd pain with pills. Change Lopressor to IV till GI status more stable. Am reluctant to use anti arrhythmics, amio started overnight by IM, MD advise on using it temporarily (TSH & LFTs were ok) Avoid cardizem with reduced EF.   Will need anticoagulation once inflammation from pancreatitis resolved. OP monitor and may need to see EP in the future.  Discussed the need to avoid all ETOH, caffeine, OTC multi-symptom cold meds  2. DCM:  Echo with decreased EF 35-40%. Likely non ischemic and rate-related given that he was in rapid flutter of unknown duration. Mild basilar crackles on exam but no obvious JVD or LEE. BNP only mildly elevated, CXR w/ ATX only,  hold on adding aldactone for now, needs BB. On ACE.   3. Acute systolic heart failure Net fluids: Not accurate.24 hr count is better today. Weight up 1 lb. (330lbs). On lisinopril 5mg .  IVF at 50 cc/hr, po intake still very poor  4. Pancreatitis: With large pseudocyst, plan per IM/GI. Diet had been advanced, but he had more pain  Otherwise, per IM Principal Problem:   SIRS without infection or organ dysfunction (HCC) Active Problems:   Acute pancreatitis   Obesity, BMI> 40   Snores   Former smoker   EtOH dependence (HCC)   Pancreatitis   Atrial flutter (HCC)   Morbid obesity due to excess calories (HCC)   Alcohol abuse   Acute on chronic pancreatitis (HCC)   Dyslipidemia   Acute systolic CHF (congestive heart failure) (HCC)   CHF (congestive heart failure) (HCC)   Pancreatic pseudocyst   Signed, Theodore Demark , PA-C 7:53 AM 01/21/2016

## 2016-01-22 DIAGNOSIS — K859 Acute pancreatitis without necrosis or infection, unspecified: Secondary | ICD-10-CM | POA: Insufficient documentation

## 2016-01-22 MED ORDER — LORAZEPAM 2 MG/ML IJ SOLN
1.0000 mg | Freq: Four times a day (QID) | INTRAMUSCULAR | Status: AC | PRN
  Administered 2016-01-22 – 2016-01-25 (×3): 1 mg via INTRAVENOUS
  Filled 2016-01-22 (×3): qty 1

## 2016-01-22 MED ORDER — METOPROLOL SUCCINATE ER 50 MG PO TB24
50.0000 mg | ORAL_TABLET | Freq: Two times a day (BID) | ORAL | Status: DC
  Administered 2016-01-22: 50 mg via ORAL
  Filled 2016-01-22: qty 1

## 2016-01-22 MED ORDER — LORAZEPAM 1 MG PO TABS: 1.0000 mg | ORAL_TABLET | Freq: Four times a day (QID) | ORAL | Status: AC | PRN

## 2016-01-22 MED ORDER — METOPROLOL TARTRATE 5 MG/5ML IV SOLN
5.0000 mg | INTRAVENOUS | Status: DC | PRN
  Administered 2016-01-22 – 2016-01-25 (×6): 5 mg via INTRAVENOUS
  Filled 2016-01-22 (×8): qty 5

## 2016-01-22 NOTE — Progress Notes (Signed)
Patient Name: Nathaniel [name] Date of Encounter: 01/22/2016  Principal Problem:   SIRS without infection or organ dysfunction [facility]) Active Problems:   Acute pancreatitis   Obesity, BMI> 40   Snores   Former smoker   EtOH dependence (HCC)   Pancreatitis   Atrial flutter (HCC)   Morbid obesity due to excess calories (HCC)   Alcohol abuse   Acute on chronic pancreatitis (HCC)   Dyslipidemia   Acute systolic CHF (congestive heart failure) (HCC)   CHF (congestive heart failure) (HCC)   Pancreatic pseudocyst   Primary Cardiologist: New  Patient Profile: 25 yo male w/ hx pancreatitis, ETOH use, admitted 05/20 w/ recurrent pancreatits, cards consulted for atrial flutter, RVR. EF 35-40% by echo  SUBJECTIVE: + RUQ abd pain now, has been some better last 24 hours since NPO, currently doesn't think taking pills is giving him pain  OBJECTIVE Filed Vitals:   01/21/16 1201 01/21/16 1349 01/21/16 1439 01/22/16 0509  BP: 111/70  117/78 130/90  Pulse:  69 68 62  Temp:   99.4 F (37.4 C) 98.5 F (36.9 C)  TempSrc:   Oral Oral  Resp:   20 20  Height:      Weight:    331 lb 3.2 oz (150.231 kg)  SpO2: 96%  97% 98%    Intake/Output Summary (Last 24 hours) at 01/22/16 0759 Last data filed at 01/22/16 0100  Gross per 24 hour  Intake    800 ml  Output    675 ml  Net    125 ml   Filed Weights   01/19/16 0607 01/20/16 0510 01/22/16 0509  Weight: 330 lb 14.6 oz (150.1 kg) 331 lb 5.6 oz (150.3 kg) 331 lb 3.2 oz (150.231 kg)    PHYSICAL EXAM General: Well developed, well nourished, male in no acute distress. Head: Normocephalic, atraumatic.  Neck: Supple without bruits, JVD. Lungs:  Resp regular and unlabored, CTA. Heart: RRR, S1, S2, no S3, S4, or murmur; no rub. Abdomen: Soft, non-tender, non-distended, BS + x 4.  Extremities: No clubbing, cyanosis, edema.  Neuro: Alert and oriented X 3. Moves all extremities spontaneously. Psych: Normal affect.  LABS: CBC: Recent  Labs  01/20/16 0504 01/21/16 0523  WBC 10.6* 9.1  HGB 12.3* 12.0*  HCT 37.7* 36.1*  MCV 90.0 88.5  PLT 252 278   Basic Metabolic Panel: Recent Labs  01/20/16 0504 01/21/16 0523  NA 139 140  K 3.8 3.8  CL 106 105  CO2 27 26  GLUCOSE 86 95  BUN 8 8  CREATININE 0.64 0.64  CALCIUM 8.0* 8.4*   BNP:  B NATRIURETIC PEPTIDE  Date/Time Value Ref Range Status  01/19/2016 01:06 PM 146.9* 0.0 - 100.0 pg/mL Final   TELE:  SR, in/out of atrial fib/flutter; several episodes start after interpolated PVC     Radiology/Studies: Dg Chest 2 View  01/20/2016  CLINICAL DATA:  Congestive heart failure. EXAM: CHEST  2 VIEW COMPARISON:  None. FINDINGS: The heart size and mediastinal contours are within normal limits. No pneumothorax is noted. Mild bibasilar opacities are noted most consistent with subsegmental atelectasis. No significant pleural effusion is noted. The visualized skeletal structures are unremarkable. IMPRESSION: Mild bibasilar subsegmental atelectasis. Electronically Signed   By: Lupita Raider, M.D.   On: 01/20/2016 13:59   Ct Abdomen W Contrast  01/20/2016  CLINICAL DATA:  Epigastric pain for 3 days EXAM: CT ABDOMEN WITH CONTRAST TECHNIQUE: Multidetector CT imaging of the abdomen was performed using the  standard protocol following bolus administration of intravenous contrast. CONTRAST:  ISOVUE-300 IOPAMIDOL (ISOVUE-300) INJECTION 61% COMPARISON:  01/17/2016 FINDINGS: Lower chest: Progressive atelectasis and consolidation identified within the lung bases, left greater than right. Small pleural effusions are noted, left greater than right. Hepatobiliary: No suspicious liver abnormalities identified. The gallbladder appears normal. There is no biliary dilatation. Pancreas: There is diffuse edema involving the pancreas with surrounding fat stranding compatible with acute pancreatitis. The fluid collection scratch set pseudocyst adjacent to the head of pancreas measures 3.4 by 2.5 by  5.8 cm peer on the previous exam this measured 4.9 x 3.6 x 5.5 cm. No new pseudo cysts identified within the upper abdomen Spleen: Negative.  The splenic vein remains patent. Adrenals/Urinary Tract: The adrenal glands are unremarkable. Normal appearance of the kidneys. Stomach/Bowel: The stomach appears normal. No pathologic dilatation of the upper abdominal bowel loops. Vascular/Lymphatic: Normal appearance of the abdominal aorta. Numerous prominent upper abdominal lymph nodes are identified, likely reactive. Other: No ascites or focal fluid collections identified. Musculoskeletal: The visualized bony structures are unremarkable. IMPRESSION: 1. Changes of acute pancreatitis are again identified. 2. Cyst cyst adjacent to head of pancreas is again noted and appears decreased in volume from previous exam. No new fluid collections identified. 3. Worsening aeration to both lung bases with small bilateral pleural effusions. Electronically Signed   By: Signa Kell M.D.   On: 01/20/2016 14:04     Current Medications:  . docusate sodium  50 mg Oral BID  . folic acid  1 mg Oral Daily  . lisinopril  5 mg Oral Daily  . metoprolol tartrate  50 mg Oral BID  . multivitamin with minerals  1 tablet Oral Daily  . nicotine  21 mg Transdermal Daily  . pantoprazole  40 mg Oral Daily  . polyethylene glycol  17 g Oral Daily  . sodium chloride flush  3 mL Intravenous Q12H  . thiamine  100 mg Oral Daily  . traMADol  50 mg Oral Q6H   . dextrose 5 % and 0.45% NaCl 50 mL/hr at 01/22/16 0120    ASSESSMENT AND PLAN:. 1. Atrial Flutter, RVR:  He is going in and out of flutter. BP is stable.  He responds well to the lopressor, will let us know if the pills give him abd pain Can change Lopressor back to IV if needed. Amio started overnight 05/24 by IM, but not an ideal drug for him, d/c'd Avoid cardizem with reduced EF.   Will need anticoagulation once inflammation from pancreatitis resolved. OP monitor and may  need to see EP in the future.  Aware he needs to avoid all ETOH, caffeine, OTC multi-symptom cold meds  2. DCM:  Echo with decreased EF 35-40%. Likely non ischemic and rate-related given that he was in rapid flutter of unknown duration.Volume status stable, CXR w/ ATX only, hold on adding aldactone for now, needs BB. On ACE.   3. Acute systolic heart failure Net fluids not accurate.Weight stable at 331. On lisinopril .  IVF at 50 cc/hr, no po intake   4. Pancreatitis: With large pseudocyst, plan per IM/GI. Diet had been advanced, but he had more pain so now is NPO except for pills.   Otherwise, per IM Principal Problem:   SIRS without infection or organ dysfunction (HCC) Active Problems:   Acute pancreatitis   Obesity, BMI> 40   Snores   Former smoker   EtOH dependence (HCC)   Pancreatitis   Atrial flutter (HCC)  Morbid obesity due to excess calories (HCC)   Alcohol abuse   Acute on chronic pancreatitis (HCC)   Dyslipidemia   Acute systolic CHF (congestive heart failure) (HCC)   CHF (congestive heart failure) (HCC)   Pancreatic pseudocyst   Signed, Theodore Demark , PA-C 7:59 AM 01/22/2016

## 2016-01-22 NOTE — Progress Notes (Signed)
[facility] PROGRESS NOTE    Progress Note  Nathaniel [name]  ZOX:[id number] DOB: 16-Jun-1991 DOA: 01/17/2016 PCP: No PCP Per Patient     Brief Narrative:   Nathaniel [name] is an 25 y.o. male past medical history of significant alcohol abuse and alcoholic pancreatitis came into the hospital with abdominal pain diagnosed with pancreatitis and developed A. fib with RVR in house.  Assessment/Plan:   SIRS without infection or organ dysfunction due to Acute on chronic pancreatitis [facility]): CT of the abdomen and pelvis revealed pseudocyst which cirrhosis and 6 cm and is decreased in size. No fevers and leukocytosis has resolved. He relates his pain is controlled to continue IV pain medication continue to keep him nothing by mouth. Continue IV fluids of half-normal saline.  A. fib/A flutter: CHADsVASC score 0. Continue oral metoprolol, and IV when necessary.  Cardiology On board. He remains in A. Fib, had an episode of RVR this morning going up to the 180s was given his IV metoprolol and oral and his HR came down to 100. Awaiting cardiology recommendations.  Acute systolic heart failure: With an EF of 40%, likely due to arrhythmia and alcohol abuse.  Continue metoprolol. He is currently on a beta blocker and low sodium diet. Continue daily weights.  Obesity, BMI> 40: Counseling.  Snores: Will need a sleep study as an outpatient.  EtOH dependence (HCC) No signs of withdrawal.   Dyslipidemia HDL 37 and LDL of 64, have recommended diet and exercise. Reevaluate in 6 months.  Pancreatic pseudocyst CT scan of the abdomen and pelvis show improved pseudocyst.   DVT prophylaxis: scd'S Family Communication: Counseling Disposition Plan/Barrier to D/C: Home in 2-3 days Code Status:     Code Status Orders        Start     Ordered   01/17/16 1150  Full code   Continuous     01/17/16 1149    Code Status History    Date Active Date Inactive Code Status Order ID Comments  User Context   This patient has a current code status but no historical code status.        IV Access:    Peripheral IV   Procedures and diagnostic studies:   Dg Chest 2 View  01/20/2016  CLINICAL DATA:  Congestive heart failure. EXAM: CHEST  2 VIEW COMPARISON:  None. FINDINGS: The heart size and mediastinal contours are within normal limits. No pneumothorax is noted. Mild bibasilar opacities are noted most consistent with subsegmental atelectasis. No significant pleural effusion is noted. The visualized skeletal structures are unremarkable. IMPRESSION: Mild bibasilar subsegmental atelectasis. Electronically Signed   By: Lupita Raider, M.D.   On: 01/20/2016 13:59   Ct Abdomen W Contrast  01/20/2016  CLINICAL DATA:  Epigastric pain for 3 days EXAM: CT ABDOMEN WITH CONTRAST TECHNIQUE: Multidetector CT imaging of the abdomen was performed using the standard protocol following bolus administration of intravenous contrast. CONTRAST:  ISOVUE-300 IOPAMIDOL (ISOVUE-300) INJECTION 61% COMPARISON:  01/17/2016 FINDINGS: Lower chest: Progressive atelectasis and consolidation identified within the lung bases, left greater than right. Small pleural effusions are noted, left greater than right. Hepatobiliary: No suspicious liver abnormalities identified. The gallbladder appears normal. There is no biliary dilatation. Pancreas: There is diffuse edema involving the pancreas with surrounding fat stranding compatible with acute pancreatitis. The fluid collection scratch set pseudocyst adjacent to the head of pancreas measures 3.4 by 2.5 by 5.8 cm peer on the previous exam this measured 4.9 x 3.6 x 5.5 cm.  No new pseudo cysts identified within the upper abdomen Spleen: Negative.  The splenic vein remains patent. Adrenals/Urinary Tract: The adrenal glands are unremarkable. Normal appearance of the kidneys. Stomach/Bowel: The stomach appears normal. No pathologic dilatation of the upper abdominal bowel loops.  Vascular/Lymphatic: Normal appearance of the abdominal aorta. Numerous prominent upper abdominal lymph nodes are identified, likely reactive. Other: No ascites or focal fluid collections identified. Musculoskeletal: The visualized bony structures are unremarkable. IMPRESSION: 1. Changes of acute pancreatitis are again identified. 2. Cyst cyst adjacent to head of pancreas is again noted and appears decreased in volume from previous exam. No new fluid collections identified. 3. Worsening aeration to both lung bases with small bilateral pleural effusions. Electronically Signed   By: Signa Kell M.D.   On: 01/20/2016 14:04     Medical Consultants:    None.  Anti-Infectives:   None  Subjective:    Nathaniel [name] no complains, he relates his pain is well-controlled.  Objective:    Filed Vitals:   01/21/16 1201 01/21/16 1349 01/21/16 1439 01/22/16 0509  BP: 111/70  117/78 130/90  Pulse:  69 68 62  Temp:   99.4 F (37.4 C) 98.5 F (36.9 C)  TempSrc:   Oral Oral  Resp:   20 20  Height:      Weight:    150.231 kg (331 lb 3.2 oz)  SpO2: 96%  97% 98%    Intake/Output Summary (Last 24 hours) at 01/22/16 0943 Last data filed at 01/22/16 0100  Gross per 24 hour  Intake    800 ml  Output    675 ml  Net    125 ml   Filed Weights   01/19/16 0607 01/20/16 0510 01/22/16 0509  Weight: 150.1 kg (330 lb 14.6 oz) 150.3 kg (331 lb 5.6 oz) 150.231 kg (331 lb 3.2 oz)    Exam: General exam: In no acute distress. Respiratory system: Good air movement and clear to auscultation. Cardiovascular system: S1 & S2 heard, RRR. No JVD, murmurs, rubs, gallops or clicks.  Gastrointestinal system: Abdomen is soft with epigastric tenderness no rebound or guarding. Central nervous system: Alert and oriented. No focal neurological deficits. Extremities: No pedal edema. Skin: No rashes, lesions or ulcers Psychiatry: Judgement and insight appear normal. Mood & affect appropriate.    Data Reviewed:      Labs: Basic Metabolic Panel:  Recent Labs Lab 01/17/16 0603 01/18/16 0523 01/18/16 0902 01/20/16 0504 01/21/16 0523  NA 140 139  --  139 140  K 4.3 4.4  --  3.8 3.8  CL 111 108  --  106 105  CO2 23 26  --  27 26  GLUCOSE 100* 95  --  86 95  BUN 18 9  --  8 8  CREATININE 0.87 0.77  --  0.64 0.64  CALCIUM 8.6* 8.1*  --  8.0* 8.4*  MG  --   --  1.8  --   --    GFR Estimated Creatinine Clearance: 212.8 mL/min (by C-G formula based on Cr of 0.64). Liver Function Tests:  Recent Labs Lab 01/17/16 0603 01/18/16 0523  AST 21 16  ALT 26 20  ALKPHOS 34* 29*  BILITOT 0.6 1.1  PROT 6.9 6.3*  ALBUMIN 3.9 3.5    Recent Labs Lab 01/17/16 0603 01/18/16 0902 01/20/16 0504  LIPASE 1745* 645* 95*   No results for input(s): AMMONIA in the last 168 hours. Coagulation profile  Recent Labs Lab 01/18/16 0523  INR 1.22  CBC:  Recent Labs Lab 01/17/16 0603 01/18/16 0523 01/20/16 0504 01/21/16 0523  WBC 16.8* 12.2* 10.6* 9.1  NEUTROABS 12.6*  --   --   --   HGB 13.9 12.1* 12.3* 12.0*  HCT 41.7 36.9* 37.7* 36.1*  MCV 88.0 88.3 90.0 88.5  PLT 315 264 252 278   Cardiac Enzymes: No results for input(s): CKTOTAL, CKMB, CKMBINDEX, TROPONINI in the last 168 hours. BNP (last 3 results) No results for input(s): PROBNP in the last 8760 hours. CBG: No results for input(s): GLUCAP in the last 168 hours. D-Dimer: No results for input(s): DDIMER in the last 72 hours. Hgb A1c: No results for input(s): HGBA1C in the last 72 hours. Lipid Profile: No results for input(s): CHOL, HDL, LDLCALC, TRIG, CHOLHDL, LDLDIRECT in the last 72 hours. Thyroid function studies: No results for input(s): TSH, T4TOTAL, T3FREE, THYROIDAB in the last 72 hours.  Invalid input(s): FREET3 Anemia work up: No results for input(s): VITAMINB12, FOLATE, FERRITIN, TIBC, IRON, RETICCTPCT in the last 72 hours. Sepsis Labs:  Recent Labs Lab 01/17/16 0603 01/18/16 0523 01/20/16 0504  01/21/16 0523  WBC 16.8* 12.2* 10.6* 9.1   Microbiology No results found for this or any previous visit (from the past 240 hour(s)).   Medications:   . docusate sodium  50 mg Oral BID  . folic acid  1 mg Oral Daily  . lisinopril  5 mg Oral Daily  . metoprolol tartrate  50 mg Oral BID  . multivitamin with minerals  1 tablet Oral Daily  . nicotine  21 mg Transdermal Daily  . pantoprazole  40 mg Oral Daily  . polyethylene glycol  17 g Oral Daily  . sodium chloride flush  3 mL Intravenous Q12H  . thiamine  100 mg Oral Daily  . traMADol  50 mg Oral Q6H   Continuous Infusions: . dextrose 5 % and 0.45% NaCl 50 mL/hr at 01/22/16 0120    Time spent: 25 min   LOS: 5 days   Marinda Elk  [facility] Pager [phone]  *Please refer to [email or web address], password [email or web address] to get updated schedule on who will round on this patient, as hospitalists switch teams weekly. If 7PM-7AM, please contact night-coverage at [email or web address], password [email or web address] for any overnight needs.  01/22/2016, 9:43 AM

## 2016-01-23 ENCOUNTER — Encounter (HOSPITAL_COMMUNITY): Payer: Self-pay | Admitting: Physician Assistant

## 2016-01-23 ENCOUNTER — Inpatient Hospital Stay (HOSPITAL_COMMUNITY): Payer: BLUE CROSS/BLUE SHIELD

## 2016-01-23 LAB — BASIC METABOLIC PANEL
Anion gap: 8 (ref 5–15)
BUN: 9 mg/dL (ref 6–20)
CO2: 25 mmol/L (ref 22–32)
Calcium: 8.3 mg/dL — ABNORMAL LOW (ref 8.9–10.3)
Chloride: 106 mmol/L (ref 101–111)
Creatinine, Ser: 0.7 mg/dL (ref 0.61–1.24)
GFR calc Af Amer: >60 mL/min (ref >60)
GFR calc non Af Amer: >60 mL/min (ref >60)
Glucose, Bld: 125 mg/dL — ABNORMAL HIGH (ref 65–99)
Potassium: 3.6 mmol/L (ref 3.5–5.1)
Sodium: 139 mmol/L (ref 135–145)

## 2016-01-23 LAB — TRIGLYCERIDES: Triglycerides: 100 mg/dL (ref <150)

## 2016-01-23 LAB — SEDIMENTATION RATE: Sed Rate: 57 mm/hr — ABNORMAL HIGH (ref 0–16)

## 2016-01-23 MED ORDER — ONDANSETRON HCL 4 MG/2ML IJ SOLN
4.0000 mg | Freq: Two times a day (BID) | INTRAMUSCULAR | Status: DC
  Administered 2016-01-23 – 2016-01-24 (×3): 4 mg via INTRAVENOUS
  Filled 2016-01-23 (×3): qty 2

## 2016-01-23 MED ORDER — METOPROLOL SUCCINATE ER 25 MG PO TB24
75.0000 mg | ORAL_TABLET | Freq: Two times a day (BID) | ORAL | Status: DC
  Administered 2016-01-23 – 2016-01-26 (×8): 75 mg via ORAL
  Filled 2016-01-23 (×8): qty 1

## 2016-01-23 MED ORDER — METOCLOPRAMIDE HCL 5 MG/ML IJ SOLN
5.0000 mg | Freq: Three times a day (TID) | INTRAMUSCULAR | Status: DC | PRN
  Administered 2016-01-23 – 2016-01-25 (×5): 5 mg via INTRAVENOUS
  Filled 2016-01-23 (×5): qty 2

## 2016-01-23 MED ORDER — AMIODARONE HCL IN DEXTROSE 360-4.14 MG/200ML-% IV SOLN
60.0000 mg/h | INTRAVENOUS | Status: DC
  Administered 2016-01-23 (×2): 60 mg/h via INTRAVENOUS

## 2016-01-23 MED ORDER — AMIODARONE HCL IN DEXTROSE 360-4.14 MG/200ML-% IV SOLN
30.0000 mg/h | INTRAVENOUS | Status: DC
  Administered 2016-01-23 – 2016-01-25 (×5): 30 mg/h via INTRAVENOUS
  Filled 2016-01-23 (×7): qty 200

## 2016-01-23 MED ORDER — PANTOPRAZOLE SODIUM 40 MG IV SOLR
40.0000 mg | Freq: Two times a day (BID) | INTRAVENOUS | Status: DC
  Administered 2016-01-23 – 2016-01-25 (×6): 40 mg via INTRAVENOUS
  Filled 2016-01-23 (×6): qty 40

## 2016-01-23 MED ORDER — AMIODARONE LOAD VIA INFUSION
150.0000 mg | Freq: Once | INTRAVENOUS | Status: AC
  Administered 2016-01-23: 150 mg via INTRAVENOUS
  Filled 2016-01-23: qty 83.34

## 2016-01-23 MED ORDER — METOPROLOL TARTRATE 5 MG/5ML IV SOLN
5.0000 mg | Freq: Once | INTRAVENOUS | Status: AC
Start: 2016-01-23 — End: 2016-01-23
  Administered 2016-01-23: 5 mg via INTRAVENOUS

## 2016-01-23 NOTE — Progress Notes (Addendum)
Patient was started on Amiodarone gtt at 1200. BP stable 121/63, 146- Afib/flutter rior to start. Pt has no complaints at this time. CP/ABD pain, SOB, N/V denied. Will continue to monitor.

## 2016-01-23 NOTE — Progress Notes (Addendum)
[facility] PROGRESS NOTE    Progress Note  Nathaniel [name]  RKY:[id number] DOB: [birth year] DOA: 01/17/2016 PCP: No PCP Per Patient     Brief Narrative:   Nathaniel [name] is an 25 y.o. male past medical history of significant alcohol abuse and alcoholic pancreatitis came into the hospital with abdominal pain diagnosed with pancreatitis and developed A. fib with RVR in house.  Assessment/Plan:   SIRS without infection or organ dysfunction due to Acute on chronic pancreatitis [facility]): CT of the abdomen and pelvis revealed pseudocyst less than 6 cm and is decreasing in size. No fevers and leukocytosis has resolved. He relates Has no further pains overnight.he cont to vomit, will consult GI place NG tube post pyloric.  A. fib/A flutter: CHADsVASC score 0. Increase metoprolol, and cont IV when necessary.  Cardiology On board.  Acute systolic heart failure: With an EF of 40%, likely due to alcohol abuse. Seems to be euvolemic. Continue metoprolol and ACE-I Continue daily weights.  Obesity, BMI> 40: Counseling.  Snores: Will need a sleep study as an outpatient.  EtOH dependence (HCC) No signs of withdrawal.   Dyslipidemia: HDL 37 and LDL of 64, have recommended diet and exercise. Reevaluate in 6 months.  Pancreatic pseudocyst CT scan of the abdomen and pelvis showed improved in size pseudocyst.   DVT prophylaxis: scd'S Family Communication: Counseling Disposition Plan/Barrier to D/C: Home in 3 days Code Status:     Code Status Orders        Start     Ordered   01/17/16 1150  Full code   Continuous     01/17/16 1149    Code Status History    Date Active Date Inactive Code Status Order ID Comments User Context   This patient has a current code status but no historical code status.        IV Access:    Peripheral IV   Procedures and diagnostic studies:   No results found.   Medical Consultants:    None.  Anti-Infectives:    None  Subjective:    Nathaniel [name] no further pain, he just had panic attacks overnight.  Objective:    Filed Vitals:   01/22/16 1805 01/22/16 2121 01/23/16 0500 01/23/16 0518  BP: 128/76 119/82  102/66  Pulse: 154 158  145  Temp: 98.2 F (36.8 C) 97.6 F (36.4 C)  98.2 F (36.8 C)  TempSrc: Oral Oral  Oral  Resp: 18 18  20   Height:      Weight:   144.2 kg (317 lb 14.5 oz)   SpO2: 94% 96%  96%    Intake/Output Summary (Last 24 hours) at 01/23/16 0905 Last data filed at 01/23/16 3151  Gross per 24 hour  Intake 1569.17 ml  Output      0 ml  Net 1569.17 ml   Filed Weights   01/20/16 0510 01/22/16 0509 01/23/16 0500  Weight: 150.3 kg (331 lb 5.6 oz) 150.231 kg (331 lb 3.2 oz) 144.2 kg (317 lb 14.5 oz)    Exam: General exam: In no acute distress. Respiratory system: Good air movement and clear to auscultation. Cardiovascular system: S1 & S2 heard, RRR. No JVD, murmurs, rubs, gallops or clicks.  Gastrointestinal system: Abdomen is soft , nontender nondistended. Central nervous system: Alert and oriented. No focal neurological deficits. Extremities: No pedal edema.  Data Reviewed:    Labs: Basic Metabolic Panel:  Recent Labs Lab 01/17/16 0603 01/18/16 7616 01/18/16 0737 01/20/16 1062 01/21/16 6948 01/23/16 5462  NA 140 139  --  139 140 139  K 4.3 4.4  --  3.8 3.8 3.6  CL 111 108  --  106 105 106  CO2 23 26  --  GLUCOSE 100* 95  --  86 95 125*  BUN 18 9  --  CREATININE 0.87 0.77  --  0.64 0.64 0.70  CALCIUM 8.6* 8.1*  --  8.0* 8.4* 8.3*  MG  --   --  1.8  --   --   --    GFR Estimated Creatinine Clearance: 208 mL/min (by C-G formula based on Cr of 0.7). Liver Function Tests:  Recent Labs Lab 01/17/16 0603 01/18/16 0523  AST 21 16  ALT 26 20  ALKPHOS 34* 29*  BILITOT 0.6 1.1  PROT 6.9 6.3*  ALBUMIN 3.9 3.5    Recent Labs Lab 01/17/16 0603 01/18/16 0902 01/20/16 0504  LIPASE 1745* 645* 95*   No results for  input(s): AMMONIA in the last 168 hours. Coagulation profile  Recent Labs Lab 01/18/16 0523  INR 1.22    CBC:  Recent Labs Lab 01/17/16 0603 01/18/16 0523 01/20/16 0504 01/21/16 0523  WBC 16.8* 12.2* 10.6* 9.1  NEUTROABS 12.6*  --   --   --   HGB 13.9 12.1* 12.3* 12.0*  HCT 41.7 36.9* 37.7* 36.1*  MCV 88.0 88.3 90.0 88.5  PLT 315 264 252 278   Cardiac Enzymes: No results for input(s): CKTOTAL, CKMB, CKMBINDEX, TROPONINI in the last 168 hours. BNP (last 3 results) No results for input(s): PROBNP in the last 8760 hours. CBG: No results for input(s): GLUCAP in the last 168 hours. D-Dimer: No results for input(s): DDIMER in the last 72 hours. Hgb A1c: No results for input(s): HGBA1C in the last 72 hours. Lipid Profile: No results for input(s): CHOL, HDL, LDLCALC, TRIG, CHOLHDL, LDLDIRECT in the last 72 hours. Thyroid function studies: No results for input(s): TSH, T4TOTAL, T3FREE, THYROIDAB in the last 72 hours.  Invalid input(s): FREET3 Anemia work up: No results for input(s): VITAMINB12, FOLATE, FERRITIN, TIBC, IRON, RETICCTPCT in the last 72 hours. Sepsis Labs:  Recent Labs Lab 01/17/16 0603 01/18/16 0523 01/20/16 0504 01/21/16 0523  WBC 16.8* 12.2* 10.6* 9.1   Microbiology No results found for this or any previous visit (from the past 240 hour(s)).   Medications:   . docusate sodium  50 mg Oral BID  . folic acid  1 mg Oral Daily  . lisinopril  5 mg Oral Daily  . metoprolol succinate  50 mg Oral BID  . multivitamin with minerals  1 tablet Oral Daily  . nicotine  21 mg Transdermal Daily  . pantoprazole  40 mg Oral Daily  . polyethylene glycol  17 g Oral Daily  . sodium chloride flush  3 mL Intravenous Q12H  . thiamine  100 mg Oral Daily  . traMADol  50 mg Oral Q6H   Continuous Infusions: . dextrose 5 % and 0.45% NaCl 50 mL/hr at 01/22/16 2114    Time spent: 25 min   LOS: 6 days   Marinda Elk  [facility] Pager  [phone]  *Please refer to [email or web address], password [email or web address] to get updated schedule on who will round on this patient, as hospitalists switch teams weekly. If 7PM-7AM, please contact night-coverage at [email or web address], password [email or web address] for any overnight needs.  01/23/2016, 9:05 AM

## 2016-01-23 NOTE — Consult Note (Signed)
Referring Provider: No ref. provider found Primary Care Physician:  No PCP Per Patient Primary Gastroenterologist:  Gentry Fitz  Reason for Consultation:  Pancreatitis  HPI: Nathaniel [name] is a 25 y.o. male who is admitted to the hospital on May 20 for pancreatitis, suspected to be secondary to alcohol. This is apparently his fourth episode of pancreatitis. He tells me that he was admitted twice at [facility] and evaluated there on one other occasion with an elevated lipase but his symptoms improved in the ER and he was discharged. He tells [place] that he does not drink alcohol on a regular basis and never drinks any significant amounts; just a social drinker. He's had 2 CT scans performed on this admission, the second one on May 23 actually showed improvement in his pancreatitis and the pseudocyst that was seen previously. He actually was improving and his diet was advanced to soft foods, but then yesterday he developed significant amount of nausea and had 4 episodes of vomiting. He tells [place] that the episodes of vomiting seem to be related to receiving Dilaudid and then this morning some oral medication.  NGT has been ordered.  GI was consulted for failure to improve despite standard measures of care.  To complicate things, he has developed A. fib with RVR for which cardiology is seeing/evaluating him.  He initially had a leukocytosis of 16.8, but that has resolved. Lipase had also improved significantly (was 2636 on admission and three days ago it was down to 95).  Triglycerides only 132.  Past Medical History  Diagnosis Date  . Pancreatitis     History reviewed. No pertinent past surgical history.  Prior to Admission medications   Medication Sig Start Date End Date Taking? Authorizing Provider  ibuprofen (ADVIL,MOTRIN) 200 MG tablet Take 400-600 mg by mouth every 6 (six) hours as needed for moderate pain.   Yes Historical Provider, MD  traMADol (ULTRAM) 50 MG tablet Take 50 mg by mouth  every 6 (six) hours as needed for moderate pain.   Yes Historical Provider, MD    Current Facility-Administered Medications  Medication Dose Route Frequency Provider Last Rate Last Dose  . dextrose 5 %-0.45 % sodium chloride infusion   Intravenous Continuous Marinda Elk, MD 50 mL/hr at 01/22/16 2114    . docusate sodium (COLACE) capsule 50 mg  50 mg Oral BID Vassie Loll, MD   50 mg at 01/23/16 0930  . folic acid (FOLVITE) tablet 1 mg  1 mg Oral Daily Rhetta Mura, MD   1 mg at 01/23/16 0929  . lisinopril (PRINIVIL,ZESTRIL) tablet 5 mg  5 mg Oral Daily Wendall Stade, MD   5 mg at 01/23/16 0929  . LORazepam (ATIVAN) tablet 1 mg  1 mg Oral Q6H PRN Mallie Darting, PA-C       Or  . LORazepam (ATIVAN) injection 1 mg  1 mg Intravenous Q6H PRN Gerome Apley [place], PA-C   1 mg at 01/22/16 2111  . metoCLOPramide (REGLAN) injection 5 mg  5 mg Intravenous Q8H PRN Marinda Elk, MD   5 mg at 01/23/16 7564  . metoprolol (LOPRESSOR) injection 5 mg  5 mg Intravenous Q4H PRN Marinda Elk, MD   5 mg at 01/23/16 0520  . metoprolol succinate (TOPROL-XL) 24 hr tablet 75 mg  75 mg Oral BID Marinda Elk, MD   75 mg at 01/23/16 0929  . multivitamin with minerals tablet 1 tablet  1 tablet Oral Daily Rhetta Mura, MD  1 tablet at 01/23/16 0929  . nicotine (NICODERM CQ - dosed in mg/24 hours) patch 21 mg  21 mg Transdermal Daily Rhetta Mura, MD   21 mg at 01/23/16 0928  . ondansetron (ZOFRAN) injection 4 mg  4 mg Intravenous Q8H PRN Elson Areas, PA-C   4 mg at 01/23/16 0209  . pantoprazole (PROTONIX) injection 40 mg  40 mg Intravenous Q12H Marinda Elk, MD      . polyethylene glycol (MIRALAX / Ethelene Hal) packet 17 g  17 g Oral Daily Vassie Loll, MD   17 g at 01/20/16 1512  . sodium chloride flush (NS) 0.9 % injection 3 mL  3 mL Intravenous Q12H Rhetta Mura, MD   3 mL at 01/22/16 2112  . thiamine (VITAMIN B-1) tablet 100 mg  100 mg Oral Daily Rhetta Mura, MD   100 mg at 01/20/16 1026    Allergies as of 01/17/2016  . (No Known Allergies)    History reviewed. No pertinent family history.  Social History   Social History  . Marital Status: Single    Spouse Name: N/A  . Number of Children: N/A  . Years of Education: N/A   Occupational History  . Not on file.   Social History Main Topics  . Smoking status: Former Games developer  . Smokeless tobacco: Current User  . Alcohol Use: Yes  . Drug Use: No  . Sexual Activity: Yes    Birth Control/ Protection: None   Other Topics Concern  . Not on file   Social History Narrative    Review of Systems: Ten point ROS is O/W negative except as mentioned in HPI.  Physical Exam: Vital signs in last 24 hours: Temp:  [97.6 F (36.4 C)-98.5 F (36.9 C)] 98.2 F (36.8 C) (05/26 0518) Pulse Rate:  [67-168] 168 (05/26 0929) Resp:  [18-20] 20 (05/26 0518) BP: (102-134)/(66-82) 102/66 mmHg (05/26 0518) SpO2:  [94 %-98 %] 96 % (05/26 0518) Weight:  [316 lb 4.8 oz (143.473 kg)-317 lb 14.5 oz (144.2 kg)] 316 lb 4.8 oz (143.473 kg) (05/26 0957) Last BM Date: 01/20/16 General:  Alert, Well-developed, well-nourished, pleasant and cooperative in NAD Head:  Normocephalic and atraumatic. Eyes:  Sclera clear, no icterus.  Conjunctiva pink. Ears:  Normal auditory acuity. Mouth:  No deformity or lesions.   Lungs:  Clear throughout to auscultation.  No wheezes, crackles, or rhonchi.  Heart:  Irregularly irregular. Abdomen:  Soft, non-distended.  BS very quiet/hypoactive.  Mild epigastric TTP.  Rectal:  Deferred  Msk:  Symmetrical without gross deformities. Pulses:  Normal pulses noted. Extremities:  Without clubbing or edema. Neurologic:  Alert and oriented x 4;  grossly normal neurologically. Skin:  Intact without significant lesions or rashes. Psych:  Alert and cooperative. Normal mood and affect.  Intake/Output from previous day: 05/25 0701 - 05/26 0700 In: 1569.2 [I.V.:1569.2] Out: -    Lab Results:  Recent Labs  01/21/16 0523  WBC 9.1  HGB 12.0*  HCT 36.1*  PLT 278   BMET  Recent Labs  01/21/16 0523 01/23/16 0438  NA 140 139  K 3.8 3.6  CL 105 106  CO2 26 25  GLUCOSE 95 125*  BUN 8 9  CREATININE 0.64 0.70  CALCIUM 8.4* 8.3*   IMPRESSION:  -25 year old male admitted with acute (on chronic) pancreatitis suspected to be secondary to alcohol. Also has a pseudocyst on CT scan.  Seem to be improving clinically and by imaging and then began vomiting yesterday after his diet  was advanced. There is some question if the vomiting was related to him receiving pain medication as well. This is apparently his fourth episode of pancreatitis, which was all thought to be secondary to alcohol, but the patient tells [place] that he does not drink alcohol regularly or in any significant amounts. -Atrial fibrillation with RVR:  Cardiology has been following the patient as well.  PLAN: -I think that he does warrant some evaluation to rule out other causes of pancreatitis including autoimmune with IgG4.   -We will check abdominal x-ray to evaluate for ileus. -He is awaiting NG tube placement (post-pyloric so that they can give him feedings). -We have placed him on scheduled Zofran 4 mg twice daily. -May need to be started on pancreatic enzymes once he has recovered and is placed back on PO.  ZEHR, JESSICA D.  01/23/2016, 9:57 AM  Pager number [phone]     ________________________________________________________________________  [facility] MD note:  I personally examined the patient, reviewed the data and agree with the assessment and plan described above.  He seems to have pretty mild acute pancreatitis, unclear etiology. He drinks but not very excessively. Has been told he had no gallstones by [place] 5 years ago.  ? Lipids.  I don't think he needs NG tube, would rather just pull back on his diet to water, sips, ice chips for a day or so.  Continue IV fluids.  Will start scheduled  antiemetics.  He knows to refrain from narcotic pain meds as best that he can since it can contribute to nausea, vomiting.  Will order [place] (need to document if he has gallstones), lipid testing, IgG 4 level.Rob Bunting, MD [facility] Pager [phone]

## 2016-01-23 NOTE — Progress Notes (Signed)
Patient Name: Nathaniel [name] Date of Encounter: 01/23/2016  Principal Problem:   SIRS without infection or organ dysfunction [facility]) Active Problems:   Acute pancreatitis   Obesity, BMI> 40   Snores   Former smoker   EtOH dependence (HCC)   Pancreatitis   Atrial flutter (HCC)   Morbid obesity due to excess calories (HCC)   Alcohol abuse   Acute on chronic pancreatitis (HCC)   Dyslipidemia   Acute systolic CHF (congestive heart failure) (HCC)   CHF (congestive heart failure) (HCC)   Pancreatic pseudocyst   Pancreatitis, acute   Primary Cardiologist: New, ?Dr Rennis Golden  Patient Profile: 25 yo male w/ hx pancreatitis, ETOH use, admitted 05/20 w/ recurrent pancreatits, cards consulted for atrial flutter, RVR. EF 35-40% by echo  SUBJECTIVE: C/o nausea this am, pain not as bad, wants to try liquids again. Doesn't know why he feels anxious (but it may be from rapid HR). Has been getting bed weights  OBJECTIVE Filed Vitals:   01/22/16 1805 01/22/16 2121 01/23/16 0500 01/23/16 0518  BP: 128/76 119/82  102/66  Pulse: 154 158  145  Temp: 98.2 F (36.8 C) 97.6 F (36.4 C)  98.2 F (36.8 C)  TempSrc: Oral Oral  Oral  Resp: 18 18  20   Height:      Weight:   317 lb 14.5 oz (144.2 kg)   SpO2: 94% 96%  96%    Intake/Output Summary (Last 24 hours) at 01/23/16 0855 Last data filed at 01/23/16 3403  Gross per 24 hour  Intake 1569.17 ml  Output      0 ml  Net 1569.17 ml   Filed Weights   01/20/16 0510 01/22/16 0509 01/23/16 0500  Weight: 331 lb 5.6 oz (150.3 kg) 331 lb 3.2 oz (150.231 kg) 317 lb 14.5 oz (144.2 kg)    PHYSICAL EXAM General: Well developed, well nourished, male in no acute distress. Head: Normocephalic, atraumatic.  Neck: Supple without bruits, JVD not elevated. Lungs:  Resp regular and unlabored, CTA. Heart: rapid and irregular R&R, S1, S2, no S3, S4, or murmur; no rub. Abdomen: Soft, non-tender, non-distended, BS + x 4.  Extremities: No clubbing,  cyanosis, edema.  Neuro: Alert and oriented X 3. Moves all extremities spontaneously. Psych: Normal affect.  LABS: CBC: Recent Labs  01/21/16 0523  WBC 9.1  HGB 12.0*  HCT 36.1*  MCV 88.5  PLT 278   Basic Metabolic Panel: Recent Labs  01/21/16 0523 01/23/16 0438  Nathaniel 140 139  K 3.8 3.6  CL 105 106  CO2 26 25  GLUCOSE 95 125*  BUN 8 9  CREATININE 0.64 0.70  CALCIUM 8.4* 8.3*    TELE:  Mostly atrial flutter, RVR. Brief SR overnight, interpolated PVCs start things off      Current Medications:  . docusate sodium  50 mg Oral BID  . folic acid  1 mg Oral Daily  . lisinopril  5 mg Oral Daily  . metoprolol succinate  50 mg Oral BID  . multivitamin with minerals  1 tablet Oral Daily  . nicotine  21 mg Transdermal Daily  . pantoprazole  40 mg Oral Daily  . polyethylene glycol  17 g Oral Daily  . sodium chloride flush  3 mL Intravenous Q12H  . thiamine  100 mg Oral Daily  . traMADol  50 mg Oral Q6H   . dextrose 5 % and 0.45% NaCl 50 mL/hr at 01/22/16 2114    ASSESSMENT AND PLAN: 1. Atrial Flutter,  RVR:  He is going in and out of flutter. Overnight, was mostly in atrial flutter with rate > 150. BP is stable.  Amio started overnight 05/24 by IM, but not an ideal drug for him, d/c'd Avoid cardizem with reduced EF.  Lopressor changed to Toprol XL 50 mg bid 05/25, increased to 75 mg bid 05/26 - if BB does not work, may need to reconsider amio  Will need anticoagulation once inflammation from pancreatitis resolved. OP monitor and may need to see EP in the future.  Aware he needs to avoid all ETOH, caffeine, OTC multi-symptom cold meds  2. DCM:  Echo with decreased EF 35-40%. Likely non ischemic and rate-related given that he was in rapid flutter of unknown duration.Volume status stable, CXR w/ ATX only, hold on adding aldactone for now, needs BB. On ACE.   3. Acute systolic heart failure I/O not accurate as no output recorded.Weight down to 317. will  order standing weights (he did not lose 14 lbs overnight) On lisinopril .  IVF at 50 cc/hr, no po intake   4. Pancreatitis:large pseudocyst initially, had decreased in size by CT 05/23, plan per IM/GI. Diet had been                            advanced, but he had more pain so now is NPO except for pills. Will need some sort of nutrition soon.  Otherwise, per IM Principal Problem:   SIRS without infection or organ dysfunction (HCC) Active Problems:   Acute pancreatitis   Obesity, BMI> 40   Snores   Former smoker   EtOH dependence (HCC)   Pancreatitis   Atrial flutter (HCC)   Morbid obesity due to excess calories (HCC)   Alcohol abuse   Acute on chronic pancreatitis (HCC)   Dyslipidemia   Acute systolic CHF (congestive heart failure) (HCC)   CHF (congestive heart failure) (HCC)   Pancreatic pseudocyst   Pancreatitis, acute   Signed, Theodore Demark , PA-C 8:55 AM 01/23/2016

## 2016-01-24 LAB — IGG 4: IgG, Subclass 4: 114 mg/dL — ABNORMAL HIGH (ref 2–96)

## 2016-01-24 LAB — BASIC METABOLIC PANEL
Anion gap: 9 (ref 5–15)
BUN: 8 mg/dL (ref 6–20)
CO2: 27 mmol/L (ref 22–32)
Calcium: 8.3 mg/dL — ABNORMAL LOW (ref 8.9–10.3)
Chloride: 102 mmol/L (ref 101–111)
Creatinine, Ser: 0.63 mg/dL (ref 0.61–1.24)
GFR calc Af Amer: >60 mL/min (ref >60)
GFR calc non Af Amer: >60 mL/min (ref >60)
Glucose, Bld: 146 mg/dL — ABNORMAL HIGH (ref 65–99)
Potassium: 3.2 mmol/L — ABNORMAL LOW (ref 3.5–5.1)
Sodium: 138 mmol/L (ref 135–145)

## 2016-01-24 MED ORDER — ZOLPIDEM TARTRATE 5 MG PO TABS
5.0000 mg | ORAL_TABLET | Freq: Once | ORAL | Status: AC
  Administered 2016-01-24: 5 mg via ORAL
  Filled 2016-01-24: qty 1

## 2016-01-24 MED ORDER — ONDANSETRON HCL 4 MG/2ML IJ SOLN
4.0000 mg | Freq: Three times a day (TID) | INTRAMUSCULAR | Status: DC
  Administered 2016-01-24 (×2): 4 mg via INTRAVENOUS
  Filled 2016-01-24 (×2): qty 2

## 2016-01-24 MED ORDER — SODIUM CHLORIDE 0.9 % IV SOLN
INTRAVENOUS | Status: DC
  Administered 2016-01-24 – 2016-01-26 (×4): via INTRAVENOUS
  Filled 2016-01-24 (×10): qty 1000

## 2016-01-24 NOTE — Progress Notes (Signed)
[facility] Progress Note  Subjective:  Says that he vomited 2 or 3 times last night but then says he wants to eat soon.  Does not appear that he has been receiving pain medication since early AM on 5/26.  Still having issues with tachycardia/atrial flutter; was started on amiodarone.  Objective:  Vital signs in last 24 hours: Temp:  [97.8 F (36.6 C)-99.4 F (37.4 C)] 97.8 F (36.6 C) (05/27 0630) Pulse Rate:  [111-180] 180 (05/27 0739) Resp:  [16-20] 16 (05/27 0739) BP: (114-141)/(63-107) 114/78 mmHg (05/27 0739) SpO2:  [93 %-97 %] 94 % (05/27 0739) Weight:  [315 lb (142.883 kg)-316 lb 4.8 oz (143.473 kg)] 315 lb (142.883 kg) (05/27 0630) Last BM Date: 01/20/16 General:  Alert, Well-developed, in NAD Heart:  Irregular.  Tachy. Pulm:  CTAB.  No W/R/R. Abdomen:  Soft, non-distended.  BS present.  Right epigastric TTP. Extremities:  Without edema. Neurologic:  Alert and oriented x 4;  grossly normal neurologically. Psych:  Alert and cooperative. Normal mood and affect.  Intake/Output from previous day: 05/26 0701 - 05/27 0700 In: 2388.3 [I.V.:2388.3] Out: 1175 [Urine:775; Emesis/NG output:400]  BMET  Recent Labs  01/23/16 0438 01/24/16 0509  NA 139 138  K 3.6 3.2*  CL 106 102  CO2 25 27  GLUCOSE 125* 146*  BUN 9 8  CREATININE 0.70 0.63  CALCIUM 8.3* 8.3*   US Abdomen Complete  01/23/2016  CLINICAL DATA:  Pancreatitis. Obesity. Fatty liver. CT abdomen and pelvis 01/20/2016 EXAM: ABDOMEN ULTRASOUND COMPLETE COMPARISON:  None. FINDINGS: Gallbladder: Gallbladder has a normal appearance. Gallbladder wall is 3.0 mm, within normal limits. No stones or pericholecystic fluid. No sonographic Murphy's sign. Common bile duct: Diameter: 5.0 mm Liver: Mildly heterogeneous and echogenic without focal mass. IVC: Not well seen because of bowel gas. Pancreas: Adjacent to the pancreatic head, a cystic structure measures 5.1 x 2.5 x 3.5 cm. No internal blood flow  identified. Spleen: 10.8 cm Right Kidney: Length: 14.1 cm. Echogenicity within normal limits. No mass or hydronephrosis visualized. Left Kidney: Length: 13.0 cm. Echogenicity within normal limits. No mass or hydronephrosis visualized. Abdominal aorta: Visualized portion is not aneurysmal. Other findings: Small amount of ascites is identified adjacent to the liver. IMPRESSION: 1. No gallstones identified. 2. Gallbladder wall upper limits normal, a nonspecific finding. 3. Pseudocyst identified adjacent to the pancreatic head, measuring 5.1 cm. This correlates well with the CT findings. Electronically Signed   By: Norva Pavlov M.D.   On: 01/23/2016 16:30   Dg Abd 2 Views  01/23/2016  CLINICAL DATA:  Vomiting. Concern for ileus. H/o Pancreatitis and Fatty Liver EXAM: ABDOMEN - 2 VIEW COMPARISON:  01/20/2016 CT FINDINGS: Bowel gas pattern is nonobstructive. No evidence for free intraperitoneal air on the decubitus views. No organomegaly or abnormal calcifications identified. IMPRESSION: No evidence for acute  abnormality. Electronically Signed   By: Norva Pavlov M.D.   On: 01/23/2016 15:11    Assessment / Plan: -25 year old male admitted with acute (on chronic) pancreatitis suspected to be secondary to alcohol. Also has a pseudocyst on imaging. Seemed to be improving clinically and by imaging and then began vomiting after his diet was advanced. There is some question if the vomiting was related to him receiving pain medication as well. This is apparently his fourth episode of pancreatitis, which was all thought to be secondary to alcohol, but the patient tells [place] that he does not drink alcohol regularly or in any significant amounts.  Abdominal x-ray  negative for ileus, ultrasound negative for gallstones.  IgG 4 is slightly elevated, ? AIP.  -Atrial fibrillation with RVR: Cardiology has been following the patient as well.  Started on amiodarone 5/26.  *Will increase scheduled zofran to TID. *Will  advance to clear liquids.   LOS: 7 days   ZEHR, JESSICA D.  01/24/2016, 8:30 AM  Pager number [phone]   ________________________________________________________________________  [facility] MD note:  I personally examined the patient, reviewed the data and agree with the assessment and plan described above.  Triglycerides normal. IgG4 elevated but literature review shows usually the level is >2x upper limit of normal in AIP patients (>180-190). Will keep the possibility in mind going forward.  No gallstones on [place]. Amiodarone lists nausea/vomiting as a potential side effect and so that may be contributing.   For now, continue conservative management with IV fluids, PRN IV pain meds, scheduled antinausea meds.  Will test his stomach with clears today.   Rob Bunting, MD [facility] Pager [phone]

## 2016-01-24 NOTE — Progress Notes (Signed)
  DAILY PROGRESS NOTE  Subjective:  Started on amiodarone yesterday for persistent atrial flutter - not controlled with b-blocker. Periods of sinus noted today, but then had PVC and went back into atrial flutter. Still NPO.  Objective:  Temp:  [97.8 F (36.6 C)-99.4 F (37.4 C)] 97.8 F (36.6 C) (05/27 0630) Pulse Rate:  [111-180] 180 (05/27 0739) Resp:  [16-20] 16 (05/27 0739) BP: (114-141)/(63-107) 114/78 mmHg (05/27 0739) SpO2:  [93 %-97 %] 94 % (05/27 0739) Weight:  [315 lb (142.883 kg)-316 lb 4.8 oz (143.473 kg)] 315 lb (142.883 kg) (05/27 0630) Weight change: -1 lb 9.7 oz (-0.727 kg)  Intake/Output from previous day: 05/26 0701 - 05/27 0700 In: 2388.3 [I.V.:2388.3] Out: 1175 [Urine:775; Emesis/NG output:400]  Intake/Output from this shift:    Medications: Current Facility-Administered Medications  Medication Dose Route Frequency Provider Last Rate Last Dose  . amiodarone (NEXTERONE PREMIX) 360-4.14 MG/200ML-% (1.8 mg/mL) IV infusion  30 mg/hr Intravenous Continuous Kenneth C Hilty, MD 16.7 mL/hr at 01/23/16 2335 30 mg/hr at 01/23/16 2335  . dextrose 5 %-0.45 % sodium chloride infusion   Intravenous Continuous Daniel P Jacobs, MD 125 mL/hr at 01/23/16 2147    . docusate sodium (COLACE) capsule 50 mg  50 mg Oral BID Carlos Madera, MD   50 mg at 01/23/16 0930  . folic acid (FOLVITE) tablet 1 mg  1 mg Oral Daily Jai-Gurmukh Samtani, MD   1 mg at 01/23/16 0929  . lisinopril (PRINIVIL,ZESTRIL) tablet 5 mg  5 mg Oral Daily Peter C Nishan, MD   5 mg at 01/23/16 0929  . LORazepam (ATIVAN) tablet 1 mg  1 mg Oral Q6H PRN Laura A Harduk, PA-C       Or  . LORazepam (ATIVAN) injection 1 mg  1 mg Intravenous Q6H PRN Laura A Harduk, PA-C   1 mg at 01/24/16 0009  . metoCLOPramide (REGLAN) injection 5 mg  5 mg Intravenous Q8H PRN Abraham Feliz Ortiz, MD   5 mg at 01/24/16 0217  . metoprolol (LOPRESSOR) injection 5 mg  5 mg Intravenous Q4H PRN Abraham Feliz Ortiz, MD   5 mg at 01/24/16  0736  . metoprolol succinate (TOPROL-XL) 24 hr tablet 75 mg  75 mg Oral BID Abraham Feliz Ortiz, MD   75 mg at 01/23/16 2148  . multivitamin with minerals tablet 1 tablet  1 tablet Oral Daily Jai-Gurmukh Samtani, MD   1 tablet at 01/23/16 0929  . nicotine (NICODERM CQ - dosed in mg/24 hours) patch 21 mg  21 mg Transdermal Daily Jai-Gurmukh Samtani, MD   21 mg at 01/23/16 0928  . ondansetron (ZOFRAN) injection 4 mg  4 mg Intravenous BID Jessica D Zehr, PA-C   4 mg at 01/24/16 0736  . pantoprazole (PROTONIX) injection 40 mg  40 mg Intravenous Q12H Abraham Feliz Ortiz, MD   40 mg at 01/23/16 2148  . polyethylene glycol (MIRALAX / GLYCOLAX) packet 17 g  17 g Oral Daily Carlos Madera, MD   17 g at 01/20/16 1512  . sodium chloride flush (NS) 0.9 % injection 3 mL  3 mL Intravenous Q12H Jai-Gurmukh Samtani, MD   3 mL at 01/23/16 2138  . thiamine (VITAMIN B-1) tablet 100 mg  100 mg Oral Daily Jai-Gurmukh Samtani, MD   100 mg at 01/23/16 1000    Physical Exam: General appearance: alert and no distress Lungs: clear to auscultation bilaterally Heart: irregularly irregular rhythm Extremities: extremities normal, atraumatic, no cyanosis or edema Neurologic: Grossly normal  Lab Results:   Results for orders placed or performed during the hospital encounter of 01/17/16 (from the past 48 hour(s))  Basic metabolic panel     Status: Abnormal   Collection Time: 01/23/16  4:38 AM  Result Value Ref Range   Sodium 139 135 - 145 mmol/L   Potassium 3.6 3.5 - 5.1 mmol/L   Chloride 106 101 - 111 mmol/L   CO2 25 22 - 32 mmol/L   Glucose, Bld 125 (H) 65 - 99 mg/dL   BUN 9 6 - 20 mg/dL   Creatinine, Ser 0.70 0.61 - 1.24 mg/dL   Calcium 8.3 (L) 8.9 - 10.3 mg/dL   GFR calc non Af Amer >60 >60 mL/min   GFR calc Af Amer >60 >60 mL/min    Comment: (NOTE) The eGFR has been calculated using the CKD EPI equation. This calculation has not been validated in all clinical situations. eGFR's persistently <60 mL/min signify  possible Chronic Kidney Disease.    Anion gap 8 5 - 15  Sedimentation rate     Status: Abnormal   Collection Time: 01/23/16  4:38 AM  Result Value Ref Range   Sed Rate 57 (H) 0 - 16 mm/hr  Triglycerides     Status: None   Collection Time: 01/23/16  4:38 AM  Result Value Ref Range   Triglycerides 100 <150 mg/dL    Comment: Performed at [facility]  IgG 4     Status: Abnormal   Collection Time: 01/23/16  1:03 PM  Result Value Ref Range   IgG, Subclass 4 114 (H) 2 - 96 mg/dL    Comment: (NOTE) **Results verified by repeat testing**              **Please note reference interval change** Performed At: [facility] [address] [place], [place] [id number] [place] William F MD Ph:[phone]   Basic metabolic panel     Status: Abnormal   Collection Time: 01/24/16  5:09 AM  Result Value Ref Range   Sodium 138 135 - 145 mmol/L   Potassium 3.2 (L) 3.5 - 5.1 mmol/L   Chloride 102 101 - 111 mmol/L   CO2 27 22 - 32 mmol/L   Glucose, Bld 146 (H) 65 - 99 mg/dL   BUN 8 6 - 20 mg/dL   Creatinine, Ser 0.63 0.61 - 1.24 mg/dL   Calcium 8.3 (L) 8.9 - 10.3 mg/dL   GFR calc non Af Amer >60 >60 mL/min   GFR calc Af Amer >60 >60 mL/min    Comment: (NOTE) The eGFR has been calculated using the CKD EPI equation. This calculation has not been validated in all clinical situations. eGFR's persistently <60 mL/min signify possible Chronic Kidney Disease.    Anion gap 9 5 - 15    Imaging: Us Abdomen Complete  01/23/2016  CLINICAL DATA:  Pancreatitis. Obesity. Fatty liver. CT abdomen and pelvis 01/20/2016 EXAM: ABDOMEN ULTRASOUND COMPLETE COMPARISON:  None. FINDINGS: Gallbladder: Gallbladder has a normal appearance. Gallbladder wall is 3.0 mm, within normal limits. No stones or pericholecystic fluid. No sonographic Murphy's sign. Common bile duct: Diameter: 5.0 mm Liver: Mildly heterogeneous and echogenic without focal mass. IVC: Not well seen because of bowel gas. Pancreas: Adjacent to  the pancreatic head, a cystic structure measures 5.1 x 2.5 x 3.5 cm. No internal blood flow identified. Spleen: 10.8 cm Right Kidney: Length: 14.1 cm. Echogenicity within normal limits. No mass or hydronephrosis visualized. Left Kidney: Length: 13.0 cm. Echogenicity within normal limits. No mass or hydronephrosis visualized. Abdominal   aorta: Visualized portion is not aneurysmal. Other findings: Small amount of ascites is identified adjacent to the liver. IMPRESSION: 1. No gallstones identified. 2. Gallbladder wall upper limits normal, a nonspecific finding. 3. Pseudocyst identified adjacent to the pancreatic head, measuring 5.1 cm. This correlates well with the CT findings. Electronically Signed   By: Nolon Nations M.D.   On: 01/23/2016 16:30   Dg Abd 2 Views  01/23/2016  CLINICAL DATA:  Vomiting. Concern for ileus. H/o Pancreatitis and Fatty Liver EXAM: ABDOMEN - 2 VIEW COMPARISON:  01/20/2016 CT FINDINGS: Bowel gas pattern is nonobstructive. No evidence for free intraperitoneal air on the decubitus views. No organomegaly or abnormal calcifications identified. IMPRESSION: No evidence for acute  abnormality. Electronically Signed   By: Nolon Nations M.D.   On: 01/23/2016 15:11    Assessment:  1. Principal Problem: 2.   SIRS without infection or organ dysfunction ([place]) 3. Active Problems: 4.   Acute pancreatitis 5.   Obesity, BMI> 40 6.   Snores 7.   Former smoker 8.   EtOH dependence ([place]) 9.   Pancreatitis 10.   Atrial flutter ([place]) 11.   Morbid obesity due to excess calories ([place]) 12.   Alcohol abuse 13.   Acute on chronic pancreatitis ([place]) 14.   Dyslipidemia 15.   Acute systolic CHF (congestive heart failure) ([place]) 16.   CHF (congestive heart failure) ([place]) 17.   Pancreatic pseudocyst 18.   Pancreatitis, acute 19.   Plan:  1. Paroxsymal atrial flutter overnight - still loading on IV amiodarone. Continue this today. There are more periods of sinus rhythm and this should  predominate as the amiodarone levels rise.  Time Spent Directly with Patient:  15 minutes  Length of Stay:  LOS: 7 days   Pixie Casino, MD, [facility] Attending Cardiologist [place] 01/24/2016, 8:03 AM

## 2016-01-24 NOTE — Progress Notes (Signed)
[facility] PROGRESS NOTE    Progress Note  Nathaniel [name]  ZOX:[id number] DOB: April 17, 1991 DOA: 01/17/2016 PCP: No PCP Per Patient     Brief Narrative:   Nathaniel [name] is an 24 y.o. male past medical history of significant alcohol abuse and alcoholic pancreatitis came into the hospital with abdominal pain diagnosed with pancreatitis and developed A. fib with RVR in house.  Assessment/Plan:   SIRS without infection or organ dysfunction due to Acute on chronic pancreatitis [facility]): No fevers and leukocytosis has resolved. Abdominal x-ray showed no ileus. GI was consulted and appreciate assistance. Electronically liquid diet has remained nothing by mouth overnight. Abdominal ultrasound was done that showed no acute gallbladder or biliary disease.  A. fib/A flutter: CHADsVASC score 0. On metoprolol despite this is already went into the 180s 100 he was loading with amiodarone overnight cardiology agreed. Cardiology On board.  Acute systolic heart failure: With an EF of 40%, likely due to alcohol abuse. Seems to be euvolemic. Continue metoprolol and ACE-I Continue daily weights. Decrease IV fluids  Obesity, BMI> 40: Counseling.  Snores: Will need a sleep study as an outpatient.  EtOH dependence (HCC) No signs of withdrawal.   Dyslipidemia: HDL 37 and LDL of 64, have recommended diet and exercise. Reevaluate in 6 months.  Pancreatic pseudocyst CT scan of the abdomen and pelvis showed improved in size pseudocyst.   DVT prophylaxis: scd'S Family Communication: Counseling Disposition Plan/Barrier to D/C: Home in 3-4 days Code Status:     Code Status Orders        Start     Ordered   01/17/16 1150  Full code   Continuous     01/17/16 1149    Code Status History    Date Active Date Inactive Code Status Order ID Comments User Context   This patient has a current code status but no historical code status.        IV Access:    Peripheral  IV   Procedures and diagnostic studies:   US Abdomen Complete  01/23/2016  CLINICAL DATA:  Pancreatitis. Obesity. Fatty liver. CT abdomen and pelvis 01/20/2016 EXAM: ABDOMEN ULTRASOUND COMPLETE COMPARISON:  None. FINDINGS: Gallbladder: Gallbladder has a normal appearance. Gallbladder wall is 3.0 mm, within normal limits. No stones or pericholecystic fluid. No sonographic Murphy's sign. Common bile duct: Diameter: 5.0 mm Liver: Mildly heterogeneous and echogenic without focal mass. IVC: Not well seen because of bowel gas. Pancreas: Adjacent to the pancreatic head, a cystic structure measures 5.1 x 2.5 x 3.5 cm. No internal blood flow identified. Spleen: 10.8 cm Right Kidney: Length: 14.1 cm. Echogenicity within normal limits. No mass or hydronephrosis visualized. Left Kidney: Length: 13.0 cm. Echogenicity within normal limits. No mass or hydronephrosis visualized. Abdominal aorta: Visualized portion is not aneurysmal. Other findings: Small amount of ascites is identified adjacent to the liver. IMPRESSION: 1. No gallstones identified. 2. Gallbladder wall upper limits normal, a nonspecific finding. 3. Pseudocyst identified adjacent to the pancreatic head, measuring 5.1 cm. This correlates well with the CT findings. Electronically Signed   By: Norva Pavlov M.D.   On: 01/23/2016 16:30   Dg Abd 2 Views  01/23/2016  CLINICAL DATA:  Vomiting. Concern for ileus. H/o Pancreatitis and Fatty Liver EXAM: ABDOMEN - 2 VIEW COMPARISON:  01/20/2016 CT FINDINGS: Bowel gas pattern is nonobstructive. No evidence for free intraperitoneal air on the decubitus views. No organomegaly or abnormal calcifications identified. IMPRESSION: No evidence for acute  abnormality. Electronically Signed   By: Lanora Manis  Manson Passey M.D.   On: 01/23/2016 15:11     Medical Consultants:    None.  Anti-Infectives:   None  Subjective:    Nathaniel [name] no further pain, Has not required narcotics overnight  Objective:    Filed  Vitals:   01/24/16 0231 01/24/16 0332 01/24/16 0630 01/24/16 0739  BP:  129/85 122/78 114/78  Pulse: 111 132 120 180  Temp:  99.4 F (37.4 C) 97.8 F (36.6 C)   TempSrc:  Oral Oral   Resp:  18 20 16   Height:      Weight:   142.883 kg (315 lb)   SpO2:  95% 95% 94%    Intake/Output Summary (Last 24 hours) at 01/24/16 1016 Last data filed at 01/24/16 0500  Gross per 24 hour  Intake 2388.33 ml  Output   1175 ml  Net 1213.33 ml   Filed Weights   01/23/16 0500 01/23/16 0957 01/24/16 0630  Weight: 144.2 kg (317 lb 14.5 oz) 143.473 kg (316 lb 4.8 oz) 142.883 kg (315 lb)    Exam: General exam: In no acute distress. Respiratory system: Good air movement and clear to auscultation. Cardiovascular system: S1 & S2 heard, RRR. No JVD. Gastrointestinal system: Abdomen is soft , nontender nondistended. Central nervous system: Alert and oriented. No focal neurological deficits. Extremities: No pedal edema.  Data Reviewed:    Labs: Basic Metabolic Panel:  Recent Labs Lab 01/18/16 0523 01/18/16 0902 01/20/16 0504 01/21/16 0523 01/23/16 0438 01/24/16 0509  NA 139  --  139 140 139 138  K 4.4  --  3.8 3.8 3.6 3.2*  CL 108  --  106 105 106 102  CO2 26  --  27 26 25 27   GLUCOSE 95  --  86 95 125* 146*  BUN 9  --  8 8 9 8   CREATININE 0.77  --  0.64 0.64 0.70 0.63  CALCIUM 8.1*  --  8.0* 8.4* 8.3* 8.3*  MG  --  1.8  --   --   --   --    GFR Estimated Creatinine Clearance: 207 mL/min (by C-G formula based on Cr of 0.63). Liver Function Tests:  Recent Labs Lab 01/18/16 0523  AST 16  ALT 20  ALKPHOS 29*  BILITOT 1.1  PROT 6.3*  ALBUMIN 3.5    Recent Labs Lab 01/18/16 0902 01/20/16 0504  LIPASE 645* 95*   No results for input(s): AMMONIA in the last 168 hours. Coagulation profile  Recent Labs Lab 01/18/16 0523  INR 1.22    CBC:  Recent Labs Lab 01/18/16 0523 01/20/16 0504 01/21/16 0523  WBC 12.2* 10.6* 9.1  HGB 12.1* 12.3* 12.0*  HCT 36.9* 37.7* 36.1*   MCV 88.3 90.0 88.5  PLT 264 252 278   Cardiac Enzymes: No results for input(s): CKTOTAL, CKMB, CKMBINDEX, TROPONINI in the last 168 hours. BNP (last 3 results) No results for input(s): PROBNP in the last 8760 hours. CBG: No results for input(s): GLUCAP in the last 168 hours. D-Dimer: No results for input(s): DDIMER in the last 72 hours. Hgb A1c: No results for input(s): HGBA1C in the last 72 hours. Lipid Profile:  Recent Labs  01/23/16 0438  TRIG 100   Thyroid function studies: No results for input(s): TSH, T4TOTAL, T3FREE, THYROIDAB in the last 72 hours.  Invalid input(s): FREET3 Anemia work up: No results for input(s): VITAMINB12, FOLATE, FERRITIN, TIBC, IRON, RETICCTPCT in the last 72 hours. Sepsis Labs:  Recent Labs Lab 01/18/16 0938 01/20/16 1829 01/21/16 9371  WBC 12.2* 10.6* 9.1   Microbiology No results found for this or any previous visit (from the past 240 hour(s)).   Medications:   . docusate sodium  50 mg Oral BID  . folic acid  1 mg Oral Daily  . lisinopril  5 mg Oral Daily  . metoprolol succinate  75 mg Oral BID  . multivitamin with minerals  1 tablet Oral Daily  . nicotine  21 mg Transdermal Daily  . ondansetron  4 mg Intravenous TID  . pantoprazole (PROTONIX) IV  40 mg Intravenous Q12H  . polyethylene glycol  17 g Oral Daily  . sodium chloride flush  3 mL Intravenous Q12H  . thiamine  100 mg Oral Daily   Continuous Infusions: . amiodarone 30 mg/hr (01/24/16 0940)  . dextrose 5 % and 0.45% NaCl 125 mL/hr (01/24/16 0940)    Time spent: 15 min   LOS: 7 days   Marinda Elk  [facility] Pager [phone]  *Please refer to [email or web address], password [email or web address] to get updated schedule on who will round on this patient, as hospitalists switch teams weekly. If 7PM-7AM, please contact night-coverage at [email or web address], password [email or web address] for any overnight needs.  01/24/2016, 10:16 AM

## 2016-01-25 LAB — CBC
HCT: 39.4 % (ref 39.0–52.0)
Hemoglobin: 13.1 g/dL (ref 13.0–17.0)
MCH: 29.3 pg (ref 26.0–34.0)
MCHC: 33.2 g/dL (ref 30.0–36.0)
MCV: 88.1 fL (ref 78.0–100.0)
Platelets: 424 10*3/uL — ABNORMAL HIGH (ref 150–400)
RBC: 4.47 MIL/uL (ref 4.22–5.81)
RDW: 13.1 % (ref 11.5–15.5)
WBC: 14.7 10*3/uL — ABNORMAL HIGH (ref 4.0–10.5)

## 2016-01-25 LAB — BASIC METABOLIC PANEL
Anion gap: 8 (ref 5–15)
BUN: 11 mg/dL (ref 6–20)
CO2: 26 mmol/L (ref 22–32)
Calcium: 8.4 mg/dL — ABNORMAL LOW (ref 8.9–10.3)
Chloride: 109 mmol/L (ref 101–111)
Creatinine, Ser: 0.84 mg/dL (ref 0.61–1.24)
GFR calc Af Amer: >60 mL/min (ref >60)
GFR calc non Af Amer: >60 mL/min (ref >60)
Glucose, Bld: 119 mg/dL — ABNORMAL HIGH (ref 65–99)
Potassium: 4.7 mmol/L (ref 3.5–5.1)
Sodium: 143 mmol/L (ref 135–145)

## 2016-01-25 MED ORDER — DIPHENHYDRAMINE HCL 25 MG PO CAPS
25.0000 mg | ORAL_CAPSULE | Freq: Once | ORAL | Status: AC
  Administered 2016-01-25: 25 mg via ORAL
  Filled 2016-01-25: qty 1

## 2016-01-25 MED ORDER — ONDANSETRON HCL 4 MG/2ML IJ SOLN
8.0000 mg | Freq: Three times a day (TID) | INTRAMUSCULAR | Status: DC
  Administered 2016-01-25 – 2016-01-26 (×4): 8 mg via INTRAVENOUS
  Filled 2016-01-25 (×4): qty 4

## 2016-01-25 NOTE — Progress Notes (Signed)
[facility] Progress Note    Since last GI note: He was sleeping when I entered the room. A woman in the room (family) tells me he is still bothered by nausea.  I did not wake him.  Objective: Vital signs in last 24 hours: Temp:  [98.1 F (36.7 C)-99 F (37.2 C)] 98.1 F (36.7 C) (05/28 0509) Pulse Rate:  [83-156] 156 (05/28 0519) Resp:  [18-22] 22 (05/28 0509) BP: (99-126)/(56-100) 123/56 mmHg (05/28 0509) SpO2:  [95 %-98 %] 98 % (05/27 2122) Weight:  [314 lb 4.8 oz (142.566 kg)] 314 lb 4.8 oz (142.566 kg) (05/28 0509) Last BM Date: 01/24/16   Lab Results:  Recent Labs  01/25/16 0506  WBC 14.7*  HGB 13.1  PLT 424*  MCV 88.1    Recent Labs  01/23/16 0438 01/24/16 0509 01/25/16 0506  NA 139 138 143  K 3.6 3.2* 4.7  CL 106 102 109  CO2 GLUCOSE 125* 146* 119*  BUN CREATININE 0.70 0.63 0.84  CALCIUM 8.3* 8.3* 8.4*   Studies/Results: US Abdomen Complete  01/23/2016  CLINICAL DATA:  Pancreatitis. Obesity. Fatty liver. CT abdomen and pelvis 01/20/2016 EXAM: ABDOMEN ULTRASOUND COMPLETE COMPARISON:  None. FINDINGS: Gallbladder: Gallbladder has a normal appearance. Gallbladder wall is 3.0 mm, within normal limits. No stones or pericholecystic fluid. No sonographic Murphy's sign. Common bile duct: Diameter: 5.0 mm Liver: Mildly heterogeneous and echogenic without focal mass. IVC: Not well seen because of bowel gas. Pancreas: Adjacent to the pancreatic head, a cystic structure measures 5.1 x 2.5 x 3.5 cm. No internal blood flow identified. Spleen: 10.8 cm Right Kidney: Length: 14.1 cm. Echogenicity within normal limits. No mass or hydronephrosis visualized. Left Kidney: Length: 13.0 cm. Echogenicity within normal limits. No mass or hydronephrosis visualized. Abdominal aorta: Visualized portion is not aneurysmal. Other findings: Small amount of ascites is identified adjacent to the liver. IMPRESSION: 1. No gallstones identified. 2. Gallbladder wall  upper limits normal, a nonspecific finding. 3. Pseudocyst identified adjacent to the pancreatic head, measuring 5.1 cm. This correlates well with the CT findings. Electronically Signed   By: Norva Pavlov M.D.   On: 01/23/2016 16:30   Dg Abd 2 Views  01/23/2016  CLINICAL DATA:  Vomiting. Concern for ileus. H/o Pancreatitis and Fatty Liver EXAM: ABDOMEN - 2 VIEW COMPARISON:  01/20/2016 CT FINDINGS: Bowel gas pattern is nonobstructive. No evidence for free intraperitoneal air on the decubitus views. No organomegaly or abnormal calcifications identified. IMPRESSION: No evidence for acute  abnormality. Electronically Signed   By: Norva Pavlov M.D.   On: 01/23/2016 15:11     Medications: Scheduled Meds: . docusate sodium  50 mg Oral BID  . folic acid  1 mg Oral Daily  . lisinopril  5 mg Oral Daily  . metoprolol succinate  75 mg Oral BID  . multivitamin with minerals  1 tablet Oral Daily  . nicotine  21 mg Transdermal Daily  . ondansetron  4 mg Intravenous TID  . pantoprazole (PROTONIX) IV  40 mg Intravenous Q12H  . polyethylene glycol  17 g Oral Daily  . sodium chloride flush  3 mL Intravenous Q12H  . thiamine  100 mg Oral Daily   Continuous Infusions: . amiodarone 30 mg/hr (01/24/16 2251)  . sodium chloride 0.9 % 1,000 mL with potassium chloride 40 mEq infusion 100 mL/hr at 01/24/16 2134   PRN Meds:.LORazepam **OR** LORazepam, metoCLOPramide (REGLAN) injection, metoprolol    Assessment/Plan: 25 y.o. male with  acute pancreatitis, unclear etiology, recurrent  No gallstones on [place], +/- drinking history, normal lipids, IgG 4 slightly elevated.  Abd pain has really subsided and his biggest issue is nausea for several days now.  This may be in part due to RVR and amiodarone (has nausea as side effect).  I'm going to increase his scheduled zofran to 8mg  tid and will continue follow him.      Rachael Fee, MD  01/25/2016, 7:58 AM [facility] Pager [phone]

## 2016-01-25 NOTE — Progress Notes (Signed)
Pt HR maintaining between 140-150s while pt at rest. 5mg  PRN metroprolol given, HR came down to 90-110s. Will continue to monitor closely.

## 2016-01-25 NOTE — Progress Notes (Signed)
DAILY PROGRESS NOTE  Subjective:  Atrial flutter with periods of alternating sinus - still with high rate episodes. Nauseated today, ?related to amiodarone or pancreatitis.  Objective:  Temp:  [98.1 F (36.7 C)-99 F (37.2 C)] 98.1 F (36.7 C) (05/28 0509) Pulse Rate:  [83-156] 156 (05/28 0519) Resp:  [18-22] 22 (05/28 0509) BP: (99-126)/(56-100) 123/56 mmHg (05/28 0509) SpO2:  [95 %-98 %] 98 % (05/27 2122) Weight:  [314 lb 4.8 oz (142.566 kg)] 314 lb 4.8 oz (142.566 kg) (05/28 0509) Weight change: -2 lb (-0.907 kg)  Intake/Output from previous day: 05/27 0701 - 05/28 0700 In: 21229.6 [P.O.:120; I.V.:21109.6] Out: 741 [Urine:725; Emesis/NG output:16]  Intake/Output from this shift:    Medications: Current Facility-Administered Medications  Medication Dose Route Frequency Provider Last Rate Last Dose  . amiodarone (NEXTERONE PREMIX) 360-4.14 MG/200ML-% (1.8 mg/mL) IV infusion  30 mg/hr Intravenous Continuous Pixie Casino, MD 16.7 mL/hr at 01/24/16 2251 30 mg/hr at 01/24/16 2251  . docusate sodium (COLACE) capsule 50 mg  50 mg Oral BID Barton Dubois, MD   50 mg at 01/24/16 0936  . folic acid (FOLVITE) tablet 1 mg  1 mg Oral Daily Nita Sells, MD   1 mg at 01/24/16 0936  . lisinopril (PRINIVIL,ZESTRIL) tablet 5 mg  5 mg Oral Daily Josue Hector, MD   5 mg at 01/24/16 0936  . LORazepam (ATIVAN) tablet 1 mg  1 mg Oral Q6H PRN Pollie Friar, PA-C       Or  . LORazepam (ATIVAN) injection 1 mg  1 mg Intravenous Q6H PRN Hewitt Shorts [place], PA-C   1 mg at 01/25/16 0326  . metoCLOPramide (REGLAN) injection 5 mg  5 mg Intravenous Q8H PRN Charlynne Cousins, MD   5 mg at 01/25/16 0000  . metoprolol (LOPRESSOR) injection 5 mg  5 mg Intravenous Q4H PRN Charlynne Cousins, MD   5 mg at 01/25/16 0519  . metoprolol succinate (TOPROL-XL) 24 hr tablet 75 mg  75 mg Oral BID Charlynne Cousins, MD   75 mg at 01/24/16 2135  . multivitamin with minerals tablet 1 tablet  1 tablet Oral  Daily Nita Sells, MD   1 tablet at 01/23/16 0929  . nicotine (NICODERM CQ - dosed in mg/24 hours) patch 21 mg  21 mg Transdermal Daily Nita Sells, MD   21 mg at 01/24/16 0935  . ondansetron (ZOFRAN) injection 8 mg  8 mg Intravenous TID Milus Banister, MD      . pantoprazole (PROTONIX) injection 40 mg  40 mg Intravenous Q12H Charlynne Cousins, MD   40 mg at 01/24/16 2134  . polyethylene glycol (MIRALAX / GLYCOLAX) packet 17 g  17 g Oral Daily Barton Dubois, MD   17 g at 01/20/16 1512  . sodium chloride 0.9 % 1,000 mL with potassium chloride 40 mEq infusion   Intravenous Continuous Charlynne Cousins, MD 100 mL/hr at 01/24/16 2134    . sodium chloride flush (NS) 0.9 % injection 3 mL  3 mL Intravenous Q12H Nita Sells, MD   3 mL at 01/24/16 2137  . thiamine (VITAMIN B-1) tablet 100 mg  100 mg Oral Daily Nita Sells, MD   100 mg at 01/24/16 0737    Physical Exam: General appearance: alert and no distress Lungs: clear to auscultation bilaterally Heart: irregularly irregular rhythm Extremities: extremities normal, atraumatic, no cyanosis or edema Neurologic: Grossly normal  Lab Results: Results for orders placed or performed during the hospital encounter  of 01/17/16 (from the past 48 hour(s))  IgG 4     Status: Abnormal   Collection Time: 01/23/16  1:03 PM  Result Value Ref Range   IgG, Subclass 4 114 (H) 2 - 96 mg/dL    Comment: (NOTE) **Results verified by repeat testing**              **Please note reference interval change** Performed At: [facility] [place], [place] [id number] Lindon Romp MD NW:[id number]   Basic metabolic panel     Status: Abnormal   Collection Time: 01/24/16  5:09 AM  Result Value Ref Range   Sodium 138 135 - 145 mmol/L   Potassium 3.2 (L) 3.5 - 5.1 mmol/L   Chloride 102 101 - 111 mmol/L   CO2 27 22 - 32 mmol/L   Glucose, Bld 146 (H) 65 - 99 mg/dL   BUN 8 6 - 20 mg/dL   Creatinine, Ser 0.63  0.61 - 1.24 mg/dL   Calcium 8.3 (L) 8.9 - 10.3 mg/dL   GFR calc non Af Amer >60 >60 mL/min   GFR calc Af Amer >60 >60 mL/min    Comment: (NOTE) The eGFR has been calculated using the CKD EPI equation. This calculation has not been validated in all clinical situations. eGFR's persistently <60 mL/min signify possible Chronic Kidney Disease.    Anion gap 9 5 - 15  Basic metabolic panel     Status: Abnormal   Collection Time: 01/25/16  5:06 AM  Result Value Ref Range   Sodium 143 135 - 145 mmol/L   Potassium 4.7 3.5 - 5.1 mmol/L    Comment: SLIGHT HEMOLYSIS DELTA CHECK NOTED    Chloride 109 101 - 111 mmol/L   CO2 26 22 - 32 mmol/L   Glucose, Bld 119 (H) 65 - 99 mg/dL   BUN 11 6 - 20 mg/dL   Creatinine, Ser 0.84 0.61 - 1.24 mg/dL   Calcium 8.4 (L) 8.9 - 10.3 mg/dL   GFR calc non Af Amer >60 >60 mL/min   GFR calc Af Amer >60 >60 mL/min    Comment: (NOTE) The eGFR has been calculated using the CKD EPI equation. This calculation has not been validated in all clinical situations. eGFR's persistently <60 mL/min signify possible Chronic Kidney Disease.    Anion gap 8 5 - 15  CBC     Status: Abnormal   Collection Time: 01/25/16  5:06 AM  Result Value Ref Range   WBC 14.7 (H) 4.0 - 10.5 K/uL   RBC 4.47 4.22 - 5.81 MIL/uL   Hemoglobin 13.1 13.0 - 17.0 g/dL   HCT 39.4 39.0 - 52.0 %   MCV 88.1 78.0 - 100.0 fL   MCH 29.3 26.0 - 34.0 pg   MCHC 33.2 30.0 - 36.0 g/dL   RDW 13.1 11.5 - 15.5 %   Platelets 424 (H) 150 - 400 K/uL    Imaging: US Abdomen Complete  01/23/2016  CLINICAL DATA:  Pancreatitis. Obesity. Fatty liver. CT abdomen and pelvis 01/20/2016 EXAM: ABDOMEN ULTRASOUND COMPLETE COMPARISON:  None. FINDINGS: Gallbladder: Gallbladder has a normal appearance. Gallbladder wall is 3.0 mm, within normal limits. No stones or pericholecystic fluid. No sonographic Murphy's sign. Common bile duct: Diameter: 5.0 mm Liver: Mildly heterogeneous and echogenic without focal mass. IVC: Not well  seen because of bowel gas. Pancreas: Adjacent to the pancreatic head, a cystic structure measures 5.1 x 2.5 x 3.5 cm. No internal blood flow identified. Spleen: 10.8 cm Right Kidney:  Length: 14.1 cm. Echogenicity within normal limits. No mass or hydronephrosis visualized. Left Kidney: Length: 13.0 cm. Echogenicity within normal limits. No mass or hydronephrosis visualized. Abdominal aorta: Visualized portion is not aneurysmal. Other findings: Small amount of ascites is identified adjacent to the liver. IMPRESSION: 1. No gallstones identified. 2. Gallbladder wall upper limits normal, a nonspecific finding. 3. Pseudocyst identified adjacent to the pancreatic head, measuring 5.1 cm. This correlates well with the CT findings. Electronically Signed   By: Nolon Nations M.D.   On: 01/23/2016 16:30   Dg Abd 2 Views  01/23/2016  CLINICAL DATA:  Vomiting. Concern for ileus. H/o Pancreatitis and Fatty Liver EXAM: ABDOMEN - 2 VIEW COMPARISON:  01/20/2016 CT FINDINGS: Bowel gas pattern is nonobstructive. No evidence for free intraperitoneal air on the decubitus views. No organomegaly or abnormal calcifications identified. IMPRESSION: No evidence for acute  abnormality. Electronically Signed   By: Nolon Nations M.D.   On: 01/23/2016 15:11    Assessment:  Principal Problem:   SIRS without infection or organ dysfunction (HCC) Active Problems:   Acute pancreatitis   Obesity, BMI> 40   Snores   Former smoker   EtOH dependence ([place])   Pancreatitis   Atrial flutter (HCC)   Morbid obesity due to excess calories (HCC)   Alcohol abuse   Acute on chronic pancreatitis (HCC)   Dyslipidemia   Acute systolic CHF (congestive heart failure) (HCC)   CHF (congestive heart failure) (HCC)   Pancreatic pseudocyst   Pancreatitis, acute   Plan:  1. Paroxsymal atrial flutter overnight - periods of NSR today. Continue IV Amiodarone - this could be associated with nausea, may be worthwhile to see if nausea improves with  feeding and anti-emetics. May need to consider an alternative antiarrhythmic such as sotalol or Tikosyn. Will ask EP to evaluate next week if flutter does not abate.  Time Spent Directly with Patient:  15 minutes  Length of Stay:  LOS: 8 days   Pixie Casino, MD, [facility] Attending Cardiologist [place] 01/25/2016, 8:37 AM

## 2016-01-25 NOTE — Progress Notes (Signed)
[facility] PROGRESS NOTE    Progress Note  Nathaniel [name]  BMW:[id number] DOB: 22-Jan-1991 DOA: 01/17/2016 PCP: No PCP Per Patient     Brief Narrative:   Nathaniel [name] is an 25 y.o. male past medical history of significant alcohol abuse and alcoholic pancreatitis came into the hospital with abdominal pain diagnosed with pancreatitis and developed A. fib with RVR in house.  Assessment/Plan:   SIRS without infection or organ dysfunction due to Acute on chronic pancreatitis [facility]): No fevers and leukocytosis has resolved.  GI was consulted and appreciate assistance. Having nausea they have increased Zofran.  A. fib/A flutter: CHADsVASC score 0. On metoprolol and amiodarone and continues to switch between A. fib A flutter and tachycardic cardiology to continue amiodarone.  Acute systolic heart failure: With an EF of 40%, likely due to alcohol abuse. Seems to be euvolemic. Continue metoprolol and ACE-I Continue daily weights. Decrease IV fluids.  Obesity, BMI> 40: Counseling.  Snores: Will need a sleep study as an outpatient.  EtOH dependence (HCC) No signs of withdrawal.   Dyslipidemia: HDL 37 and LDL of 64, have recommended diet and exercise. Reevaluate in 6 months.  Pancreatic pseudocyst CT scan of the abdomen and pelvis showed improved in size pseudocyst.   DVT prophylaxis: scd'S Family Communication: Counseling Disposition Plan/Barrier to D/C: Home in 2 days Code Status:     Code Status Orders        Start     Ordered   01/17/16 1150  Full code   Continuous     01/17/16 1149    Code Status History    Date Active Date Inactive Code Status Order ID Comments User Context   This patient has a current code status but no historical code status.        IV Access:    Peripheral IV   Procedures and diagnostic studies:   US Abdomen Complete  01/23/2016  CLINICAL DATA:  Pancreatitis. Obesity. Fatty liver. CT abdomen and pelvis 01/20/2016  EXAM: ABDOMEN ULTRASOUND COMPLETE COMPARISON:  None. FINDINGS: Gallbladder: Gallbladder has a normal appearance. Gallbladder wall is 3.0 mm, within normal limits. No stones or pericholecystic fluid. No sonographic Murphy's sign. Common bile duct: Diameter: 5.0 mm Liver: Mildly heterogeneous and echogenic without focal mass. IVC: Not well seen because of bowel gas. Pancreas: Adjacent to the pancreatic head, a cystic structure measures 5.1 x 2.5 x 3.5 cm. No internal blood flow identified. Spleen: 10.8 cm Right Kidney: Length: 14.1 cm. Echogenicity within normal limits. No mass or hydronephrosis visualized. Left Kidney: Length: 13.0 cm. Echogenicity within normal limits. No mass or hydronephrosis visualized. Abdominal aorta: Visualized portion is not aneurysmal. Other findings: Small amount of ascites is identified adjacent to the liver. IMPRESSION: 1. No gallstones identified. 2. Gallbladder wall upper limits normal, a nonspecific finding. 3. Pseudocyst identified adjacent to the pancreatic head, measuring 5.1 cm. This correlates well with the CT findings. Electronically Signed   By: Norva Pavlov M.D.   On: 01/23/2016 16:30   Dg Abd 2 Views  01/23/2016  CLINICAL DATA:  Vomiting. Concern for ileus. H/o Pancreatitis and Fatty Liver EXAM: ABDOMEN - 2 VIEW COMPARISON:  01/20/2016 CT FINDINGS: Bowel gas pattern is nonobstructive. No evidence for free intraperitoneal air on the decubitus views. No organomegaly or abnormal calcifications identified. IMPRESSION: No evidence for acute  abnormality. Electronically Signed   By: Norva Pavlov M.D.   On: 01/23/2016 15:11     Medical Consultants:    None.  Anti-Infectives:   None  Subjective:    Nathaniel [name] no further pain, Some nausea and vomiting overnight.  Objective:    Filed Vitals:   01/24/16 1500 01/24/16 2122 01/25/16 0509 01/25/16 0519  BP:  99/74 123/56   Pulse: 111 84 83 156  Temp:  99 F (37.2 C) 98.1 F (36.7 C)   TempSrc:   Oral Oral   Resp:  18 22   Height:      Weight:   142.566 kg (314 lb 4.8 oz)   SpO2:  98%      Intake/Output Summary (Last 24 hours) at 01/25/16 0933 Last data filed at 01/25/16 0700  Gross per 24 hour  Intake 21229.63 ml  Output    741 ml  Net 20488.63 ml   Filed Weights   01/23/16 0957 01/24/16 0630 01/25/16 0509  Weight: 143.473 kg (316 lb 4.8 oz) 142.883 kg (315 lb) 142.566 kg (314 lb 4.8 oz)    Exam: General exam: In no acute distress. Respiratory system: Good air movement and clear to auscultation. Cardiovascular system: S1 & S2 heard, RRR. No JVD. Gastrointestinal system: Abdomen is soft , nontender nondistended. Central nervous system: Alert and oriented. No focal neurological deficits. Extremities: No pedal edema.  Data Reviewed:    Labs: Basic Metabolic Panel:  Recent Labs Lab 01/20/16 0504 01/21/16 0523 01/23/16 0438 01/24/16 0509 01/25/16 0506  NA 139 140 139 138 143  K 3.8 3.8 3.6 3.2* 4.7  CL 106 105 106 102 109  CO2 27 26 25 27 26   GLUCOSE 86 95 125* 146* 119*  BUN 8 8 9 8 11   CREATININE 0.64 0.64 0.70 0.63 0.84  CALCIUM 8.0* 8.4* 8.3* 8.3* 8.4*   GFR Estimated Creatinine Clearance: 197 mL/min (by C-G formula based on Cr of 0.84). Liver Function Tests: No results for input(s): AST, ALT, ALKPHOS, BILITOT, PROT, ALBUMIN in the last 168 hours.  Recent Labs Lab 01/20/16 0504  LIPASE 95*   No results for input(s): AMMONIA in the last 168 hours. Coagulation profile No results for input(s): INR, PROTIME in the last 168 hours.  CBC:  Recent Labs Lab 01/20/16 0504 01/21/16 0523 01/25/16 0506  WBC 10.6* 9.1 14.7*  HGB 12.3* 12.0* 13.1  HCT 37.7* 36.1* 39.4  MCV 90.0 88.5 88.1  PLT 252 278 424*   Cardiac Enzymes: No results for input(s): CKTOTAL, CKMB, CKMBINDEX, TROPONINI in the last 168 hours. BNP (last 3 results) No results for input(s): PROBNP in the last 8760 hours. CBG: No results for input(s): GLUCAP in the last 168  hours. D-Dimer: No results for input(s): DDIMER in the last 72 hours. Hgb A1c: No results for input(s): HGBA1C in the last 72 hours. Lipid Profile:  Recent Labs  01/23/16 0438  TRIG 100   Thyroid function studies: No results for input(s): TSH, T4TOTAL, T3FREE, THYROIDAB in the last 72 hours.  Invalid input(s): FREET3 Anemia work up: No results for input(s): VITAMINB12, FOLATE, FERRITIN, TIBC, IRON, RETICCTPCT in the last 72 hours. Sepsis Labs:  Recent Labs Lab 01/20/16 0504 01/21/16 0523 01/25/16 0506  WBC 10.6* 9.1 14.7*   Microbiology No results found for this or any previous visit (from the past 240 hour(s)).   Medications:   . docusate sodium  50 mg Oral BID  . folic acid  1 mg Oral Daily  . lisinopril  5 mg Oral Daily  . metoprolol succinate  75 mg Oral BID  . multivitamin with minerals  1 tablet Oral Daily  . nicotine  21 mg Transdermal  Daily  . ondansetron  8 mg Intravenous TID  . pantoprazole (PROTONIX) IV  40 mg Intravenous Q12H  . polyethylene glycol  17 g Oral Daily  . sodium chloride flush  3 mL Intravenous Q12H  . thiamine  100 mg Oral Daily   Continuous Infusions: . amiodarone 30 mg/hr (01/24/16 2251)  . sodium chloride 0.9 % 1,000 mL with potassium chloride 40 mEq infusion 100 mL/hr at 01/24/16 2134    Time spent: 15 min   LOS: 8 days   Marinda Elk  [facility] Pager [phone]  *Please refer to [email or web address], password [email or web address] to get updated schedule on who will round on this patient, as hospitalists switch teams weekly. If 7PM-7AM, please contact night-coverage at [email or web address], password [email or web address] for any overnight needs.  01/25/2016, 9:33 AM

## 2016-01-26 DIAGNOSIS — I4891 Unspecified atrial fibrillation: Secondary | ICD-10-CM

## 2016-01-26 LAB — CBC
HCT: 37.1 % — ABNORMAL LOW (ref 39.0–52.0)
Hemoglobin: 12.1 g/dL — ABNORMAL LOW (ref 13.0–17.0)
MCH: 29 pg (ref 26.0–34.0)
MCHC: 32.6 g/dL (ref 30.0–36.0)
MCV: 89 fL (ref 78.0–100.0)
Platelets: 370 10*3/uL (ref 150–400)
RBC: 4.17 MIL/uL — ABNORMAL LOW (ref 4.22–5.81)
RDW: 13.1 % (ref 11.5–15.5)
WBC: 12.3 10*3/uL — ABNORMAL HIGH (ref 4.0–10.5)

## 2016-01-26 MED ORDER — ASPIRIN 81 MG PO CHEW
81.0000 mg | CHEWABLE_TABLET | Freq: Every day | ORAL | Status: DC
  Administered 2016-01-26 – 2016-01-27 (×2): 81 mg via ORAL
  Filled 2016-01-26 (×2): qty 1

## 2016-01-26 MED ORDER — AMIODARONE HCL 200 MG PO TABS
400.0000 mg | ORAL_TABLET | Freq: Every day | ORAL | Status: DC
  Administered 2016-01-26 – 2016-01-27 (×2): 400 mg via ORAL
  Filled 2016-01-26 (×3): qty 2

## 2016-01-26 MED ORDER — ONDANSETRON HCL 4 MG/2ML IJ SOLN: 8.0000 mg | Freq: Four times a day (QID) | INTRAMUSCULAR | Status: DC | PRN

## 2016-01-26 MED ORDER — PANTOPRAZOLE SODIUM 40 MG PO TBEC
40.0000 mg | DELAYED_RELEASE_TABLET | Freq: Every day | ORAL | Status: DC
  Administered 2016-01-26 – 2016-01-27 (×2): 40 mg via ORAL
  Filled 2016-01-26 (×2): qty 1

## 2016-01-26 NOTE — Progress Notes (Addendum)
DAILY PROGRESS NOTE  Subjective:  Sinus rhythm at 62 this morning ... Remains on IV amiodarone. Still with some nausea.   Objective:  Temp:  [97.8 F (36.6 C)-99.1 F (37.3 C)] 97.8 F (36.6 C) (05/29 0523) Pulse Rate:  [78-89] 78 (05/29 0523) Resp:  [20] 20 (05/29 0523) BP: (105-132)/(62-86) 105/62 mmHg (05/29 0523) SpO2:  [99 %] 99 % (05/29 0523) Weight:  [317 lb 6.4 oz (143.972 kg)] 317 lb 6.4 oz (143.972 kg) (05/29 0523) Weight change: 3 lb 1.6 oz (1.406 kg)  Intake/Output from previous day: 05/28 0701 - 05/29 0700 In: 2140 [P.O.:440; I.V.:1700] Out: 250 [Urine:250]  Intake/Output from this shift:    Medications: Current Facility-Administered Medications  Medication Dose Route Frequency Provider Last Rate Last Dose  . amiodarone (NEXTERONE PREMIX) 360-4.14 MG/200ML-% (1.8 mg/mL) IV infusion  30 mg/hr Intravenous Continuous Pixie Casino, MD 16.7 mL/hr at 01/25/16 2214 30 mg/hr at 01/25/16 2214  . docusate sodium (COLACE) capsule 50 mg  50 mg Oral BID Barton Dubois, MD   50 mg at 01/25/16 2215  . folic acid (FOLVITE) tablet 1 mg  1 mg Oral Daily Nita Sells, MD   1 mg at 01/25/16 1035  . lisinopril (PRINIVIL,ZESTRIL) tablet 5 mg  5 mg Oral Daily Josue Hector, MD   5 mg at 01/25/16 1035  . metoCLOPramide (REGLAN) injection 5 mg  5 mg Intravenous Q8H PRN Charlynne Cousins, MD   5 mg at 01/25/16 1436  . metoprolol (LOPRESSOR) injection 5 mg  5 mg Intravenous Q4H PRN Charlynne Cousins, MD   5 mg at 01/25/16 0519  . metoprolol succinate (TOPROL-XL) 24 hr tablet 75 mg  75 mg Oral BID Charlynne Cousins, MD   75 mg at 01/25/16 2215  . multivitamin with minerals tablet 1 tablet  1 tablet Oral Daily Nita Sells, MD   1 tablet at 01/25/16 1035  . nicotine (NICODERM CQ - dosed in mg/24 hours) patch 21 mg  21 mg Transdermal Daily Nita Sells, MD   21 mg at 01/25/16 1033  . ondansetron (ZOFRAN) injection 8 mg  8 mg Intravenous TID Milus Banister, MD    8 mg at 01/25/16 2216  . pantoprazole (PROTONIX) injection 40 mg  40 mg Intravenous Q12H Charlynne Cousins, MD   40 mg at 01/25/16 2216  . polyethylene glycol (MIRALAX / GLYCOLAX) packet 17 g  17 g Oral Daily Barton Dubois, MD   17 g at 01/20/16 1512  . sodium chloride 0.9 % 1,000 mL with potassium chloride 40 mEq infusion   Intravenous Continuous Charlynne Cousins, MD 100 mL/hr at 01/26/16 0730    . sodium chloride flush (NS) 0.9 % injection 3 mL  3 mL Intravenous Q12H Nita Sells, MD   3 mL at 01/25/16 2200  . thiamine (VITAMIN B-1) tablet 100 mg  100 mg Oral Daily Nita Sells, MD   100 mg at 01/25/16 1035    Physical Exam: General appearance: alert and no distress Lungs: clear to auscultation bilaterally Heart: regular rate and rhythm Extremities: extremities normal, atraumatic, no cyanosis or edema Neurologic: Grossly normal  Lab Results: Results for orders placed or performed during the hospital encounter of 01/17/16 (from the past 48 hour(s))  Basic metabolic panel     Status: Abnormal   Collection Time: 01/25/16  5:06 AM  Result Value Ref Range   Sodium 143 135 - 145 mmol/L   Potassium 4.7 3.5 - 5.1 mmol/L  Comment: SLIGHT HEMOLYSIS DELTA CHECK NOTED    Chloride 109 101 - 111 mmol/L   CO2 26 22 - 32 mmol/L   Glucose, Bld 119 (H) 65 - 99 mg/dL   BUN 11 6 - 20 mg/dL   Creatinine, Ser 0.84 0.61 - 1.24 mg/dL   Calcium 8.4 (L) 8.9 - 10.3 mg/dL   GFR calc non Af Amer >60 >60 mL/min   GFR calc Af Amer >60 >60 mL/min    Comment: (NOTE) The eGFR has been calculated using the CKD EPI equation. This calculation has not been validated in all clinical situations. eGFR's persistently <60 mL/min signify possible Chronic Kidney Disease.    Anion gap 8 5 - 15  CBC     Status: Abnormal   Collection Time: 01/25/16  5:06 AM  Result Value Ref Range   WBC 14.7 (H) 4.0 - 10.5 K/uL   RBC 4.47 4.22 - 5.81 MIL/uL   Hemoglobin 13.1 13.0 - 17.0 g/dL   HCT 39.4 39.0 -  52.0 %   MCV 88.1 78.0 - 100.0 fL   MCH 29.3 26.0 - 34.0 pg   MCHC 33.2 30.0 - 36.0 g/dL   RDW 13.1 11.5 - 15.5 %   Platelets 424 (H) 150 - 400 K/uL  CBC     Status: Abnormal   Collection Time: 01/26/16  4:12 AM  Result Value Ref Range   WBC 12.3 (H) 4.0 - 10.5 K/uL   RBC 4.17 (L) 4.22 - 5.81 MIL/uL   Hemoglobin 12.1 (L) 13.0 - 17.0 g/dL   HCT 37.1 (L) 39.0 - 52.0 %   MCV 89.0 78.0 - 100.0 fL   MCH 29.0 26.0 - 34.0 pg   MCHC 32.6 30.0 - 36.0 g/dL   RDW 13.1 11.5 - 15.5 %   Platelets 370 150 - 400 K/uL    Imaging: No results found.  Assessment:  Principal Problem:   SIRS without infection or organ dysfunction (HCC) Active Problems:   Acute pancreatitis   Obesity, BMI> 40   Snores   Former smoker   EtOH dependence ([place])   Pancreatitis   Atrial flutter (HCC)   Morbid obesity due to excess calories (HCC)   Alcohol abuse   Acute on chronic pancreatitis (HCC)   Dyslipidemia   Acute systolic CHF (congestive heart failure) (HCC)   CHF (congestive heart failure) (HCC)   Pancreatic pseudocyst   Pancreatitis, acute   Plan:  1. No evidence for recurrent atrial flutter overnight - leads came off at one point. In sinus rhythm in the low 60's this morning.  D/c IV amidarone and switch to po amiodarone 400 mg daily. Advance diet. Family interested in [place] paperwork to be filled out. We can assist with the cardiac portion of this after discharge at follow-up. Given his cardiomyopathy (although thought to be rate-related), his CHADSVASC score is actually 1 (CHF) - not zero. Stroke risk is considered low, but as there is also a recommendation for aspirin with cardiomyopathy, would recommend starting low dose aspirin 81 mg daily at this point. I do not think he is at risk for necrotizing pancreatitis at this point.   Time Spent Directly with Patient:  15 minutes  Length of Stay:  LOS: 9 days   Pixie Casino, MD, [facility] Attending Cardiologist [place] 01/26/2016, 7:31 AM

## 2016-01-26 NOTE — Progress Notes (Signed)
Standard Gastroenterology Progress Note    Since last GI note: Tolerated full liquid breakfast, but vomited with dinner.  He has NO abd pains.  HR seems under much better control.  Objective: Vital signs in last 24 hours: Temp:  [97.8 F (36.6 C)-99.1 F (37.3 C)] 97.8 F (36.6 C) (05/29 0523) Pulse Rate:  [78-89] 78 (05/29 0523) Resp:  [20] 20 (05/29 0523) BP: (105-132)/(62-86) 105/62 mmHg (05/29 0523) SpO2:  [99 %] 99 % (05/29 0523) Weight:  [317 lb 6.4 oz (143.972 kg)] 317 lb 6.4 oz (143.972 kg) (05/29 0523) Last BM Date: 01/25/16 General: alert and oriented times 3 Heart: regular rate and rythm Abdomen: soft, non-tender, non-distended, normal bowel sounds   Lab Results:  Recent Labs  01/25/16 0506 01/26/16 0412  WBC 14.7* 12.3*  HGB 13.1 12.1*  PLT 424* 370  MCV 88.1 89.0    Recent Labs  01/24/16 0509 01/25/16 0506  NA 138 143  K 3.2* 4.7  CL 102 109  CO2 27 26  GLUCOSE 146* 119*  BUN 8 11  CREATININE 0.63 0.84  CALCIUM 8.3* 8.4*   Medications: Scheduled Meds: . docusate sodium  50 mg Oral BID  . folic acid  1 mg Oral Daily  . lisinopril  5 mg Oral Daily  . metoprolol succinate  75 mg Oral BID  . multivitamin with minerals  1 tablet Oral Daily  . nicotine  21 mg Transdermal Daily  . ondansetron  8 mg Intravenous TID  . pantoprazole (PROTONIX) IV  40 mg Intravenous Q12H  . polyethylene glycol  17 g Oral Daily  . sodium chloride flush  3 mL Intravenous Q12H  . thiamine  100 mg Oral Daily   Continuous Infusions: . amiodarone 30 mg/hr (01/25/16 2214)  . sodium chloride 0.9 % 1,000 mL with potassium chloride 40 mEq infusion 100 mL/hr at 01/25/16 2215   PRN Meds:.metoCLOPramide (REGLAN) injection, metoprolol    Assessment/Plan: 25 y.o. male with acute pancreatitis, unclear etiology, recurrent  Zofran increased to 8mg  IV scheduled yesterday.  This may be helping.  His HR seems to have stabilized, hopefully he can come off the amiodarone drip  (which can contribute to his nausea).    No gallstones on [place], +/- drinking history, normal lipids, IgG 4 slightly elevated.  Will advance diet to regular solids today.  If he tolerates diet increase over next 24 hours then probably ok to d/c from my perspective.     Rachael Fee, MD  01/26/2016, 7:23 AM [facility] Pager [phone]

## 2016-01-26 NOTE — Progress Notes (Signed)
[facility] PROGRESS NOTE    Progress Note  Nathaniel [name]  YTW:[id number] DOB: 06-21-1991 DOA: 01/17/2016 PCP: No PCP Per Patient     Brief Narrative:   Nathaniel [name] is an 24 y.o. male past medical history of significant alcohol abuse and alcoholic pancreatitis came into the hospital with abdominal pain diagnosed with pancreatitis and developed A. fib with RVR in house.  Assessment/Plan:   SIRS without infection or organ dysfunction due to Acute on chronic pancreatitis [facility]): No fevers and leukocytosis has resolved.  GI was consulted and appreciate assistance. Tolerating diet change Zofran to Ceftin and to use when necessary.  A. fib/A flutter: CHADsVASC score 1`. On metoprolol and amiodarone, HR < 100  Acute systolic heart failure: With an EF of 40%, likely due to alcohol abuse. Seems to be euvolemic. Continue metoprolol and ACE-I Continue daily weights.   Obesity, BMI> 40: Counseling.  Snores: Will need a sleep study as an outpatient.  EtOH dependence (HCC) No signs of withdrawal.   Dyslipidemia: HDL 37 and LDL of 64, have recommended diet and exercise. Reevaluate in 6 months.  Pancreatic pseudocyst CT scan of the abdomen and pelvis showed improved in size pseudocyst.   DVT prophylaxis: scd'S Family Communication: Counseling Disposition Plan/Barrier to D/C: Home in am Code Status:     Code Status Orders        Start     Ordered   01/17/16 1150  Full code   Continuous     01/17/16 1149    Code Status History    Date Active Date Inactive Code Status Order ID Comments User Context   This patient has a current code status but no historical code status.        IV Access:    Peripheral IV   Procedures and diagnostic studies:   No results found.   Medical Consultants:    None.  Anti-Infectives:   None  Subjective:    Nathaniel [name] tolerating diet no nausea or vomiting this morning. He did had an episode of nausea  yesterday. Breakfast is staying down.  Objective:    Filed Vitals:   01/25/16 0519 01/25/16 1419 01/25/16 2049 01/26/16 0523  BP:  116/63 132/86 105/62  Pulse: 156 89 81 78  Temp:  99.1 F (37.3 C) 98.7 F (37.1 C) 97.8 F (36.6 C)  TempSrc:  Oral Oral Oral  Resp:  20 20 20   Height:      Weight:    143.972 kg (317 lb 6.4 oz)  SpO2:  99% 99% 99%    Intake/Output Summary (Last 24 hours) at 01/26/16 0954 Last data filed at 01/26/16 0911  Gross per 24 hour  Intake   2260 ml  Output    250 ml  Net   2010 ml   Filed Weights   01/24/16 0630 01/25/16 0509 01/26/16 0523  Weight: 142.883 kg (315 lb) 142.566 kg (314 lb 4.8 oz) 143.972 kg (317 lb 6.4 oz)    Exam: General exam: In no acute distress. Respiratory system: Good air movement and clear to auscultation. Cardiovascular system: S1 & S2 heard, RRR. No JVD. Gastrointestinal system: Abdomen is soft , nontender nondistended. Central nervous system: Alert and oriented. No focal neurological deficits. Extremities: No pedal edema.  Data Reviewed:    Labs: Basic Metabolic Panel:  Recent Labs Lab 01/20/16 0504 01/21/16 0523 01/23/16 0438 01/24/16 0509 01/25/16 0506  NA 139 140 139 138 143  K 3.8 3.8 3.6 3.2* 4.7  CL 106 105 106  102 109  CO2 GLUCOSE 86 95 125* 146* 119*  BUN CREATININE 0.64 0.64 0.70 0.63 0.84  CALCIUM 8.0* 8.4* 8.3* 8.3* 8.4*   GFR Estimated Creatinine Clearance: 198.1 mL/min (by C-G formula based on Cr of 0.84). Liver Function Tests: No results for input(s): AST, ALT, ALKPHOS, BILITOT, PROT, ALBUMIN in the last 168 hours.  Recent Labs Lab 01/20/16 0504  LIPASE 95*   No results for input(s): AMMONIA in the last 168 hours. Coagulation profile No results for input(s): INR, PROTIME in the last 168 hours.  CBC:  Recent Labs Lab 01/20/16 0504 01/21/16 0523 01/25/16 0506 01/26/16 0412  WBC 10.6* 9.1 14.7* 12.3*  HGB 12.3* 12.0* 13.1 12.1*  HCT 37.7* 36.1*  39.4 37.1*  MCV 90.0 88.5 88.1 89.0  PLT 252 278 424* 370   Cardiac Enzymes: No results for input(s): CKTOTAL, CKMB, CKMBINDEX, TROPONINI in the last 168 hours. BNP (last 3 results) No results for input(s): PROBNP in the last 8760 hours. CBG: No results for input(s): GLUCAP in the last 168 hours. D-Dimer: No results for input(s): DDIMER in the last 72 hours. Hgb A1c: No results for input(s): HGBA1C in the last 72 hours. Lipid Profile: No results for input(s): CHOL, HDL, LDLCALC, TRIG, CHOLHDL, LDLDIRECT in the last 72 hours. Thyroid function studies: No results for input(s): TSH, T4TOTAL, T3FREE, THYROIDAB in the last 72 hours.  Invalid input(s): FREET3 Anemia work up: No results for input(s): VITAMINB12, FOLATE, FERRITIN, TIBC, IRON, RETICCTPCT in the last 72 hours. Sepsis Labs:  Recent Labs Lab 01/20/16 0504 01/21/16 0523 01/25/16 0506 01/26/16 0412  WBC 10.6* 9.1 14.7* 12.3*   Microbiology No results found for this or any previous visit (from the past 240 hour(s)).   Medications:   . amiodarone  400 mg Oral Daily  . aspirin  81 mg Oral Daily  . docusate sodium  50 mg Oral BID  . folic acid  1 mg Oral Daily  . lisinopril  5 mg Oral Daily  . metoprolol succinate  75 mg Oral BID  . multivitamin with minerals  1 tablet Oral Daily  . nicotine  21 mg Transdermal Daily  . ondansetron  8 mg Intravenous TID  . pantoprazole  40 mg Oral Daily  . polyethylene glycol  17 g Oral Daily  . sodium chloride flush  3 mL Intravenous Q12H  . thiamine  100 mg Oral Daily   Continuous Infusions: . sodium chloride 0.9 % 1,000 mL with potassium chloride 40 mEq infusion 100 mL/hr at 01/26/16 0730    Time spent: 15 min   LOS: 9 days   Marinda Elk  [facility] Pager [phone]  *Please refer to [email or web address], password [email or web address] to get updated schedule on who will round on this patient, as hospitalists switch teams weekly. If 7PM-7AM, please contact night-coverage at  [email or web address], password [email or web address] for any overnight needs.  01/26/2016, 9:54 AM

## 2016-01-27 DIAGNOSIS — E785 Hyperlipidemia, unspecified: Secondary | ICD-10-CM

## 2016-01-27 DIAGNOSIS — K8500 Idiopathic acute pancreatitis without necrosis or infection: Secondary | ICD-10-CM

## 2016-01-27 MED ORDER — METOPROLOL SUCCINATE ER 50 MG PO TB24: 50.0000 mg | ORAL_TABLET | Freq: Two times a day (BID) | ORAL | Status: DC

## 2016-01-27 MED ORDER — ONDANSETRON HCL 8 MG PO TABS: 8.0000 mg | ORAL_TABLET | Freq: Three times a day (TID) | ORAL | Status: DC | PRN

## 2016-01-27 MED ORDER — ASPIRIN 81 MG PO CHEW: 81.0000 mg | CHEWABLE_TABLET | Freq: Every day | ORAL | Status: DC

## 2016-01-27 MED ORDER — ONDANSETRON 8 MG PO TBDP: 8.0000 mg | ORAL_TABLET | Freq: Three times a day (TID) | ORAL | Status: DC | PRN

## 2016-01-27 MED ORDER — AMIODARONE HCL 400 MG PO TABS: 400.0000 mg | ORAL_TABLET | Freq: Every day | ORAL | Status: DC

## 2016-01-27 MED ORDER — METOPROLOL SUCCINATE ER 50 MG PO TB24
50.0000 mg | ORAL_TABLET | Freq: Two times a day (BID) | ORAL | Status: DC
  Filled 2016-01-27: qty 1

## 2016-01-27 MED ORDER — LISINOPRIL 5 MG PO TABS: 5.0000 mg | ORAL_TABLET | Freq: Every day | ORAL | Status: DC

## 2016-01-27 MED ORDER — ONDANSETRON HCL 4 MG PO TABS: 8.0000 mg | ORAL_TABLET | Freq: Three times a day (TID) | ORAL | Status: DC | PRN

## 2016-01-27 NOTE — Discharge Summary (Signed)
Physician Discharge Summary  Nathaniel [name] ZOX:[id number] DOB: 1991-05-10 DOA: 01/17/2016  PCP: No PCP Per Patient  Admit date: 01/17/2016 Discharge date: 01/27/2016  Time spent: 35 minutes  Recommendations for Outpatient Follow-up:  1. Follow-up with cardiology in 1 week monitor his heart rate and titrate medications as tolerated. Check a basic metabolic panel 2. Follow-up with GI in 2-4 weeks for further evaluation of his pseudocyst.   Discharge Diagnoses:  Principal Problem:   Acute pancreatitis Active Problems:   Alcohol abuse   Atrial fibrillation with RVR (HCC)   Obesity, BMI> 40   Snores   Former smoker   EtOH dependence (HCC)   SIRS without infection or organ dysfunction (HCC)   Pancreatitis   Morbid obesity due to excess calories (HCC)   Dyslipidemia   Acute systolic CHF (congestive heart failure) (HCC)   CHF (congestive heart failure) (HCC)   Pancreatic pseudocyst   Pancreatitis, acute   Discharge Condition: Stable  Diet recommendation: Low sodium diet  Filed Weights   01/25/16 0509 01/26/16 0523 01/27/16 0516  Weight: 142.566 kg (314 lb 4.8 oz) 143.972 kg (317 lb 6.4 oz) 143.518 kg (316 lb 6.4 oz)    History of present illness:  25 year old with past medical history of tobacco no alcohol abuse that comes in to the ED after binge drinking over the weekend at [facility] complaining of abdominal pain nausea and vomiting.  Hospital Course:  SIRS without infection and with organ dysfunction due to acute pancreatitis: He was admitted to the hospital Place nothing by mouth on IV fluids and IV narcotics, his nausea vomiting, he was kept nothing by mouth for at least 7-10 days and he continued to have nausea with vomiting GI was consulted recommended initially an NG tube, and his nausea improved. He developed mild fevers and her leukocytosis, no empiric antibiotics were started and if fever and leukocytosis resolved which is probably due to his acute inflammatory  reaction.  A. fib with RVR: Probably due to acute pancreatitis and/or alcohol use, CHADsVASC score 1 (heart failure). He was started on IV metoprolol, and his heart rate was hard to control, so cardiology was consulted and metoprolol was titrated as tolerated. Due to a severe episode of RVR on amiodarone had to be started. Which improved his heart rate. On the day of discharge his heart rate was running between 50s and 60s. He will continue amiodarone orally as an outpatient. Will need follow-up with cardiology and repeat a 2-D echo as an outpatient.  Acute systolic heart failure with an EF of 45% probably nonischemic cardiomyopathy: Like to update on call abuse, he was started on high-dose beta blocker and ACE inhibitor, and his blood pressure has been stable. He will need a basic metabolic panel in 1 week.  Obesity with BMI greater than 40: Counseling was done.:  Snoring with A. fib: He is overweight with loud snoring and atrial fibrillation he'll need a sleep study as an outpatient.  Alcohol dependence: No signs of withdrawal.  Dyslipidemia: Eyes and exercise were recommended will need to be very reevaluated in 6 months.  Pancreatic pseudocyst: Repeat CT scan of the abdomen and pelvis on admission showed pseudocyst, which was improved in size and repeated CT scan several days after the initial CT scan.   Procedures:  CT abd and pelvis.  Abdominal x-ray  Abdominal ultrasound  Consultations:  Cardiology  Gastroenterology  Discharge Exam: Filed Vitals:   01/26/16 2133 01/27/16 0516  BP: 128/71 113/63  Pulse: 57 54  Temp: 98.1 F (36.7 C) 98 F (36.7 C)  Resp: 20 18    General: A&O x3 Cardiovascular: Irregular rhythm with a heart rate less than 100 Respiratory: good air movement CTA B/L  Discharge Instructions   Discharge Instructions    Diet - low sodium heart healthy    Complete by:  As directed      Increase activity slowly    Complete by:  As directed            Current Discharge Medication List    START taking these medications   Details  amiodarone (PACERONE) 400 MG tablet Take 1 tablet (400 mg total) by mouth daily. Qty: 30 tablet, Refills: 0    aspirin 81 MG chewable tablet Chew 1 tablet (81 mg total) by mouth daily. Qty: 30 tablet, Refills: 0    lisinopril (PRINIVIL,ZESTRIL) 5 MG tablet Take 1 tablet (5 mg total) by mouth daily. Qty: 30 tablet, Refills: 0    metoprolol succinate (TOPROL-XL) 50 MG 24 hr tablet Take 1 tablet (50 mg total) by mouth 2 (two) times daily. Take with or immediately following a meal. Qty: 60 tablet, Refills: 3    ondansetron (ZOFRAN ODT) 8 MG disintegrating tablet Take 1 tablet (8 mg total) by mouth every 8 (eight) hours as needed for nausea or vomiting. Qty: 20 tablet, Refills: 0    ondansetron (ZOFRAN) 8 MG tablet Take 1 tablet (8 mg total) by mouth every 8 (eight) hours as needed for nausea or vomiting. Qty: 20 tablet, Refills: 0      CONTINUE these medications which have NOT CHANGED   Details  traMADol (ULTRAM) 50 MG tablet Take 50 mg by mouth every 6 (six) hours as needed for moderate pain.      STOP taking these medications     ibuprofen (ADVIL,MOTRIN) 200 MG tablet        No Known Allergies Follow-up Information    Follow up with [facility].   Why:  Call this number; follow the prompts to secure a primary care physician OR look on the back of your insurance card, call the 866 number and get a listing of PCPs in your network   Contact information:   [phone]       The results of significant diagnostics from this hospitalization (including imaging, microbiology, ancillary and laboratory) are listed below for reference.    Significant Diagnostic Studies: Dg Chest 2 View  01/20/2016  CLINICAL DATA:  Congestive heart failure. EXAM: CHEST  2 VIEW COMPARISON:  None. FINDINGS: The heart size and mediastinal contours are within normal limits. No pneumothorax is noted. Mild  bibasilar opacities are noted most consistent with subsegmental atelectasis. No significant pleural effusion is noted. The visualized skeletal structures are unremarkable. IMPRESSION: Mild bibasilar subsegmental atelectasis. Electronically Signed   By: Lupita Raider, M.D.   On: 01/20/2016 13:59   Ct Abdomen W Contrast  01/20/2016  CLINICAL DATA:  Epigastric pain for 3 days EXAM: CT ABDOMEN WITH CONTRAST TECHNIQUE: Multidetector CT imaging of the abdomen was performed using the standard protocol following bolus administration of intravenous contrast. CONTRAST:  ISOVUE-300 IOPAMIDOL (ISOVUE-300) INJECTION 61% COMPARISON:  01/17/2016 FINDINGS: Lower chest: Progressive atelectasis and consolidation identified within the lung bases, left greater than right. Small pleural effusions are noted, left greater than right. Hepatobiliary: No suspicious liver abnormalities identified. The gallbladder appears normal. There is no biliary dilatation. Pancreas: There is diffuse edema involving the pancreas with surrounding fat stranding compatible with acute pancreatitis. The  fluid collection scratch set pseudocyst adjacent to the head of pancreas measures 3.4 by 2.5 by 5.8 cm peer on the previous exam this measured 4.9 x 3.6 x 5.5 cm. No new pseudo cysts identified within the upper abdomen Spleen: Negative.  The splenic vein remains patent. Adrenals/Urinary Tract: The adrenal glands are unremarkable. Normal appearance of the kidneys. Stomach/Bowel: The stomach appears normal. No pathologic dilatation of the upper abdominal bowel loops. Vascular/Lymphatic: Normal appearance of the abdominal aorta. Numerous prominent upper abdominal lymph nodes are identified, likely reactive. Other: No ascites or focal fluid collections identified. Musculoskeletal: The visualized bony structures are unremarkable. IMPRESSION: 1. Changes of acute pancreatitis are again identified. 2. Cyst cyst adjacent to head of pancreas is again noted and  appears decreased in volume from previous exam. No new fluid collections identified. 3. Worsening aeration to both lung bases with small bilateral pleural effusions. Electronically Signed   By: Signa Kell M.D.   On: 01/20/2016 14:04   US Abdomen Complete  01/23/2016  CLINICAL DATA:  Pancreatitis. Obesity. Fatty liver. CT abdomen and pelvis 01/20/2016 EXAM: ABDOMEN ULTRASOUND COMPLETE COMPARISON:  None. FINDINGS: Gallbladder: Gallbladder has a normal appearance. Gallbladder wall is 3.0 mm, within normal limits. No stones or pericholecystic fluid. No sonographic Murphy's sign. Common bile duct: Diameter: 5.0 mm Liver: Mildly heterogeneous and echogenic without focal mass. IVC: Not well seen because of bowel gas. Pancreas: Adjacent to the pancreatic head, a cystic structure measures 5.1 x 2.5 x 3.5 cm. No internal blood flow identified. Spleen: 10.8 cm Right Kidney: Length: 14.1 cm. Echogenicity within normal limits. No mass or hydronephrosis visualized. Left Kidney: Length: 13.0 cm. Echogenicity within normal limits. No mass or hydronephrosis visualized. Abdominal aorta: Visualized portion is not aneurysmal. Other findings: Small amount of ascites is identified adjacent to the liver. IMPRESSION: 1. No gallstones identified. 2. Gallbladder wall upper limits normal, a nonspecific finding. 3. Pseudocyst identified adjacent to the pancreatic head, measuring 5.1 cm. This correlates well with the CT findings. Electronically Signed   By: Norva Pavlov M.D.   On: 01/23/2016 16:30   Ct Abdomen Pelvis W Contrast  01/17/2016  CLINICAL DATA:  Intermittent mid abdominal pain, history of pancreatitis EXAM: CT ABDOMEN AND PELVIS WITH CONTRAST TECHNIQUE: Multidetector CT imaging of the abdomen and pelvis was performed using the standard protocol following bolus administration of intravenous contrast. CONTRAST:  ISOVUE-300 IOPAMIDOL (ISOVUE-300) INJECTION 61% COMPARISON:  None. FINDINGS: Lower chest: Mild patchy  opacities at the lung bases, likely atelectasis. Hepatobiliary: Mild hepatic steatosis. Gallbladder is unremarkable. No intrahepatic or extrahepatic ductal dilatation. Pancreas: Peripancreatic inflammatory changes/stranding, suggesting acute on chronic pancreatitis. Bilobed, mildly thick-walled fluid collection/pseudocyst adjacent to the pancreatic head and inferior to the antral pyloric region of the stomach, measuring approximately 3.6 x 4.9 x 4.7 cm (series 2/ image 29). Spleen: Within normal limits. Adrenals/Urinary Tract: Left adrenal gland is mildly thickened, without discrete mass. Right adrenal gland is within normal limits. Kidneys are within normal limits.  No hydronephrosis. Bladder is within normal limits. Stomach/Bowel: Stomach is within normal limits. No evidence of bowel obstruction. Normal appendix (series 2/ image 63). Vascular/Lymphatic: No evidence of abdominal aortic aneurysm. Numerous peripancreatic and jejunal mesentery lymph nodes, measuring up to 11 mm short axis (series 2/ image 40), likely reactive. Reproductive: Prostate is unremarkable. Other: No abdominopelvic ascites. Tiny fat containing right inguinal hernia (series 2/image 91). Musculoskeletal: Mild degenerative changes the visualized thoracolumbar spine. IMPRESSION: Acute on chronic pancreatitis. Associated 4.9 cm pseudocyst adjacent to the pancreatic head  and inferior to the antral pyloric region of the stomach. Mid abdominal/jejunal mesentery lymphadenopathy, favored to be reactive. Electronically Signed   By: Charline Bills M.D.   On: 01/17/2016 07:59   Dg Abd 2 Views  01/23/2016  CLINICAL DATA:  Vomiting. Concern for ileus. H/o Pancreatitis and Fatty Liver EXAM: ABDOMEN - 2 VIEW COMPARISON:  01/20/2016 CT FINDINGS: Bowel gas pattern is nonobstructive. No evidence for free intraperitoneal air on the decubitus views. No organomegaly or abnormal calcifications identified. IMPRESSION: No evidence for acute  abnormality.  Electronically Signed   By: Norva Pavlov M.D.   On: 01/23/2016 15:11    Microbiology: No results found for this or any previous visit (from the past 240 hour(s)).   Labs: Basic Metabolic Panel:  Recent Labs Lab 01/21/16 0523 01/23/16 0438 01/24/16 0509 01/25/16 0506  NA 140 139 138 143  K 3.8 3.6 3.2* 4.7  CL 105 106 102 109  CO2 26 25 27 26   GLUCOSE 95 125* 146* 119*  BUN 8 9 8 11   CREATININE 0.64 0.70 0.63 0.84  CALCIUM 8.4* 8.3* 8.3* 8.4*   Liver Function Tests: No results for input(s): AST, ALT, ALKPHOS, BILITOT, PROT, ALBUMIN in the last 168 hours. No results for input(s): LIPASE, AMYLASE in the last 168 hours. No results for input(s): AMMONIA in the last 168 hours. CBC:  Recent Labs Lab 01/21/16 0523 01/25/16 0506 01/26/16 0412  WBC 9.1 14.7* 12.3*  HGB 12.0* 13.1 12.1*  HCT 36.1* 39.4 37.1*  MCV 88.5 88.1 89.0  PLT 278 424* 370   Cardiac Enzymes: No results for input(s): CKTOTAL, CKMB, CKMBINDEX, TROPONINI in the last 168 hours. BNP: BNP (last 3 results)  Recent Labs  01/19/16 1306  BNP 146.9*    ProBNP (last 3 results) No results for input(s): PROBNP in the last 8760 hours.  CBG: No results for input(s): GLUCAP in the last 168 hours.   Signed:  Marinda Elk MD.  [facility] 01/27/2016, 9:55 AM

## 2016-01-27 NOTE — Progress Notes (Addendum)
    Progress Note   Subjective  Tolerating solid foods without pain or nausea.    Objective  Vital signs in last 24 hours: Temp:  [98 F (36.7 C)-98.1 F (36.7 C)] 98 F (36.7 C) (05/30 0516) Pulse Rate:  [54-78] 54 (05/30 0516) Resp:  [18-20] 18 (05/30 0516) BP: (103-128)/(59-71) 113/63 mmHg (05/30 0516) SpO2:  [96 %-98 %] 96 % (05/30 0516) Weight:  [316 lb 6.4 oz (143.518 kg)] 316 lb 6.4 oz (143.518 kg) (05/30 0516) Last BM Date: 01/26/16  General: Alert, well-developed, in NAD Heart:  Regular rate and rhythm; no murmurs Chest: Clear to ascultation bilaterally Abdomen:  Soft, nontender and nondistended. Normal bowel sounds, without guarding, and without rebound.   Extremities:  Without edema. Neurologic:  Alert and  oriented x4; grossly normal neurologically. Psych:  Alert and cooperative. Normal mood and affect.  Intake/Output from previous day: 05/29 0701 - 05/30 0700 In: 120 [P.O.:120] Out: -  Intake/Output this shift:    Lab Results:  Recent Labs  01/25/16 0506 01/26/16 0412  WBC 14.7* 12.3*  HGB 13.1 12.1*  HCT 39.4 37.1*  PLT 424* 370   BMET  Recent Labs  01/25/16 0506  NA 143  K 4.7  CL 109  CO2 26  GLUCOSE 119*  BUN 11  CREATININE 0.84  CALCIUM 8.4*     Assessment & Plan   Acute pancreatitis, unclear etiology, recurrent and a 5.8 cm pancreatic pseudocyst.  Change Zofran to 8mg  PO tid prn.Now on amiodarone PO.   No gallstones on [place], + etoh history, normal lipids and IgG 4 slightly elevated but not in the range typically associated with autoimmune pancreatitis.  Continue a low fat solid diet.Discontinue etoh use. OK to d/c from GI perspective. GI signing off. Outpatient follow up with Dr. Christella Hartigan in about 1 month.    Principal Problem:   Acute pancreatitis Active Problems:   Obesity, BMI> 40   Snores   Former smoker   EtOH dependence (HCC)   SIRS without infection or organ dysfunction (HCC)   Pancreatitis   Morbid obesity due  to excess calories (HCC)   Alcohol abuse   Dyslipidemia   Acute systolic CHF (congestive heart failure) (HCC)   CHF (congestive heart failure) (HCC)   Pancreatic pseudocyst   Pancreatitis, acute   Atrial fibrillation with RVR (HCC)    LOS: 10 days   Malcolm T. Russella Dar MD 01/27/2016, 8:52 AM [phone] 8a-5p weekdays [phone] after 5p, weekends, holidays

## 2016-01-27 NOTE — Care Management Note (Signed)
Case Management Note  Patient Details  Name: Nathaniel [name] MRN: [id number] Date of Birth: 03/21/1991  Subjective/Objective:                  pancreatitis Action/Plan: Discharge planning Expected Discharge Date:  01/20/16               Expected Discharge Plan:  Home/Self Care  In-House Referral:     Discharge planning Services  CM Consult  Post Acute Care Choice:    Choice offered to:     DME Arranged:  N/A DME Agency:  NA  HH Arranged:  NA HH Agency:  NA  Status of Service:  Completed, signed off  Medicare Important Message Given:    Date Medicare IM Given:    Medicare IM give by:    Date Additional Medicare IM Given:    Additional Medicare Important Message give by:     If discussed at [place] of Stay Meetings, dates discussed:    Additional Comments: Cm met with pt in room and made pt aware of the [facility] resource number placed on his AVS to secure a PCP.  No other CM needs were communicated. [name], RN 01/27/2016, 10:48 AM

## 2016-01-27 NOTE — Discharge Instructions (Signed)

## 2016-01-28 ENCOUNTER — Encounter: Payer: Self-pay | Admitting: Physician Assistant

## 2016-02-03 ENCOUNTER — Ambulatory Visit (INDEPENDENT_AMBULATORY_CARE_PROVIDER_SITE_OTHER): Payer: BLUE CROSS/BLUE SHIELD | Admitting: Physician Assistant

## 2016-02-03 ENCOUNTER — Encounter: Payer: Self-pay | Admitting: Physician Assistant

## 2016-02-03 VITALS — BP 116/62 | HR 70 | Ht 66.0 in | Wt 311.0 lb

## 2016-02-03 DIAGNOSIS — R0683 Snoring: Secondary | ICD-10-CM

## 2016-02-03 DIAGNOSIS — I5022 Chronic systolic (congestive) heart failure: Secondary | ICD-10-CM

## 2016-02-03 DIAGNOSIS — I4892 Unspecified atrial flutter: Secondary | ICD-10-CM

## 2016-02-03 DIAGNOSIS — I420 Dilated cardiomyopathy: Secondary | ICD-10-CM | POA: Diagnosis not present

## 2016-02-03 DIAGNOSIS — Z8719 Personal history of other diseases of the digestive system: Secondary | ICD-10-CM | POA: Diagnosis not present

## 2016-02-03 HISTORY — DX: Dilated cardiomyopathy: I42.0

## 2016-02-03 MED ORDER — METOPROLOL SUCCINATE ER 50 MG PO TB24: 50.0000 mg | ORAL_TABLET | Freq: Every day | ORAL | Status: DC

## 2016-02-03 NOTE — Patient Instructions (Addendum)
Medication Instructions:  1. STAY ON AMIODARONE 400 MG DAILY FOR 2 MORE WEEKS; THEN YOU WILL DECREASE TO 200 MG DAILY 2. DECREASE TOPROL XL TO 50 MG DAILY Labwork: TODAY BMET Testing/Procedures: 1. Your physician has recommended that you wear a 48 HOUR holter monitor; THIS IS TO BE DONE 1 WEEK BEFORE APPT WITH SCOTT WEAVER, PAC IN 4 WEEKS. Holter monitors are medical devices that record the heart's electrical activity. Doctors most often use these monitors to diagnose arrhythmias. Arrhythmias are problems with the speed or rhythm of the heartbeat. The monitor is a small, portable device. You can wear one while you do your normal daily activities. This is usually used to diagnose what is causing palpitations/syncope (passing out). 2. Your physician has recommended that you have a SPLIT NIGHT sleep study. This test records several body functions during sleep, including: brain activity, eye movement, oxygen and carbon dioxide blood levels, heart rate and rhythm, breathing rate and rhythm, the flow of air through your mouth and nose, snoring, body muscle movements, and chest and belly movement. Follow-Up: 1. SCOTT WEAVER, PAC 4 WEEKS 2. DR. Eden Emms IN 8 WEEKS  Any Other Special Instructions Will Be Listed Below (If Applicable). YOU HAVE BEEN GIVEN THE PHONE NUMBER TO [facility] ; YOU WILL NEED TO CALL FOR A FOLLOW UP APPT. WITH DR. Christella Hartigan IN THE NEXT 3-4 WEEKS... [phone] If you need a refill on your cardiac medications before your next appointment, please call your pharmacy.

## 2016-02-03 NOTE — Progress Notes (Addendum)
Cardiology Office Note:    Date:  02/03/2016   ID:  [name], DOB 1991/02/19, MRN [id number]  PCP:  No PCP Per Patient  Cardiologist:  Dr. Charlton Haws   Electrophysiologist:  N/a GI:  Dr. Christella Hartigan [facility])  Referring MD: No ref. provider found   Chief Complaint  Patient presents with  . Hospitalization Follow-up    AFlutter with RVR in setting of a/c pancreatitis     History of Present Illness:     Nathaniel [name] is a 25 y.o. male with a hx of obesity, tobacco abuse, pancreatitis.   Admitted 5/20-5/30 with AFlutter with RVR in the setting of a/c pancreatitis with associated 4.9 cm pseudocyst.  Anticoagulation was initially avoided due to concern for causing hemorrhagic pseudocyst.  Echo demonstrated EF 35-40%.  This was felt to be related to elevated HR.  He was placed on beta-blocker and ACE inhibitor.  He did convert to NSR but continued to revert back and forth between AFlutter and NSR.  Rates were elevated and he was started on Amiodarone.  This was DC'd but with continued episodes of AFlutter, he was put back on Amiodarone.   He was maintaining NSR at DC.  Last seen by Dr. Rennis Golden and CHADS2-VASc=1 (CHF).  He was put on ASA 81 mg QD only.  He was DC on Amio 400 mg QD.   Patient noted to have loud snoring and OP sleep study was recommended.  OP FU with GI was also recommended.    Returns for FU.  Here alone. Since DC, he has been doing well. He had abdominal pain for a couple of days but he has had no recurrent pain since then. He feels like he had some palpitations on one occasion. He denies dyspnea but sometimes feels out of breath when he climbs into bed at night. He denies orthopnea, PND or edema. Denies chest discomfort. Denies syncope. He's been holding his Toprol if his heart rate is 60 or less.    Past Medical History  Diagnosis Date  . Pancreatitis 2012    recurrent acute. Admissions to [facility], 12/2015 admissin to [facility] hospital, ct then shows pseudocyst.   . Obesity      BMI 45 12/2015  . Fatty liver 12/2015    per CT  . Atrial flutter (HCC) 01/17/2016    Admx 5/17 with AFlutter with RVR in setting of pancreatitis // CHADS2-VASc=1 (CHF) // Anticoag not started // Amiodarone >> NSR   . Chronic systolic CHF (congestive heart failure) (HCC)     Echo 01/17/16 - EF 35-40%, diffuse HK, mild LAE   . 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HCC) 02/03/2016    EF 35-40% in setting of AFlutter with RVR in 5/17 // probably tachycardia mediated // needs FU echo 2-3 mos after NSR restored     Past Surgical History  Procedure Laterality Date  . Hand surgery      Current Medications: Outpatient Prescriptions Prior to Visit  Medication Sig Dispense Refill  . amiodarone (PACERONE) 400 MG tablet Take 1 tablet (400 mg total) by mouth daily. 30 tablet 0  . aspirin 81 MG chewable tablet Chew 1 tablet (81 mg total) by mouth daily. 30 tablet 0  . lisinopril (PRINIVIL,ZESTRIL) 5 MG tablet Take 1 tablet (5 mg total) by mouth daily. 30 tablet 0  . ondansetron (ZOFRAN ODT) 8 MG disintegrating tablet Take 1 tablet (8 mg total) by mouth every 8 (eight) hours as needed for nausea or vomiting. 20 tablet 0  . metoprolol  succinate (TOPROL-XL) 50 MG 24 hr tablet Take 1 tablet (50 mg total) by mouth 2 (two) times daily. Take with or immediately following a meal. 60 tablet 3  . ondansetron (ZOFRAN) 8 MG tablet Take 1 tablet (8 mg total) by mouth every 8 (eight) hours as needed for nausea or vomiting. (Patient not taking: Reported on 02/03/2016) 20 tablet 0  . traMADol (ULTRAM) 50 MG tablet Take 50 mg by mouth every 6 (six) hours as needed for moderate pain. Reported on 02/03/2016     No facility-administered medications prior to visit.       Allergies:   Review of patient's allergies indicates no known allergies.   Social History   Social History  . Marital Status: Single    Spouse Name: N/A  . Number of Children: 0  . Years of Education: 12   Occupational History  .  [employer]   Social  History Main Topics  . Smoking status: Former Games developer  . Smokeless tobacco: Current User  . Alcohol Use: Yes  . Drug Use: No  . Sexual Activity: Yes    Birth Control/ Protection: None   Other Topics Concern  . None   Social History Narrative     Family History:  The patient's family history includes Diabetes in his mother.   ROS:   Please see the history of present illness.    Review of Systems  Respiratory: Positive for shortness of breath.   Musculoskeletal: Positive for joint pain.  Gastrointestinal: Positive for diarrhea.  Neurological: Positive for light-headedness.   All other systems reviewed and are negative.   Physical Exam:    VS:  BP 116/62 mmHg  Pulse 70  Ht 5\' 6"  (1.676 m)  Wt 311 lb (141.069 kg)  BMI 50.22 kg/m2   Physical Exam  Constitutional: He is oriented to person, place, and time. He appears well-developed and well-nourished.  HENT:  Head: Normocephalic and atraumatic.  Neck: Normal range of motion. No JVD present.  Cardiovascular: Normal heart sounds.  Tachycardia present.   No murmur heard. Pulmonary/Chest: Effort normal and breath sounds normal. He has no wheezes. He has no rales.  Abdominal: Soft. There is no hepatosplenomegaly. There is no tenderness.  Musculoskeletal: Normal range of motion. He exhibits no edema.  Neurological: He is alert and oriented to person, place, and time.  Skin: Skin is warm and dry.  Psychiatric: He has a normal mood and affect.    Wt Readings from Last 3 Encounters:  02/03/16 311 lb (141.069 kg)  01/27/16 316 lb 6.4 oz (143.518 kg)      Studies/Labs Reviewed:     EKG:  EKG is  ordered today.  The ekg ordered today demonstrates NSR, HR 69, normal axis, PAC, QTc 426 ms  Recent Labs: 01/18/2016: ALT 20; Magnesium 1.8; TSH 1.653 01/19/2016: B Natriuretic Peptide 146.9* 01/25/2016: BUN 11; Creatinine, Ser 0.84; Potassium 4.7; Sodium 143 01/26/2016: Hemoglobin 12.1*; Platelets 370   Recent Lipid Panel      Component Value Date/Time   CHOL 128 01/17/2016 1238   TRIG 100 01/23/2016 0438   HDL 37* 01/17/2016 1238   CHOLHDL 3.5 01/17/2016 1238   VLDL 26 01/17/2016 1238   LDLCALC 65 01/17/2016 1238    Additional studies/ records that were reviewed today include:   Echo 01/17/16 EF 35-40%, diffuse HK, mild LAE  ASSESSMENT:     1. Atrial flutter, unspecified type (HCC)   2. Chronic systolic CHF (congestive heart failure) (HCC)   3. DCM (dilated  cardiomyopathy) (HCC)   4. History of pancreatitis   5. Snoring     PLAN:     In order of problems listed above:  1. AFlutter - Recent admit with a/c pancreatitis c/b AFlutter with RVR. Rate was difficult to control.  Anticoagulation was initially avoided to avoid transition to hemorrhagic pseudocyst.  With restoration of NSR and low stroke risk, chronic anticoagulation was not started.  He required Amiodarone for rate and rhythm control.  ECG today demonstrated normal sinus rhythm. When I examined the patient, he had a fast regular rhythm for several seconds. His rhythm quickly returned to normal. I question whether or not he may still be having brief paroxysms of atrial flutter. He has only been out of the hospital for 1 week. His symptoms of pancreatitis finally resolved several days after discharge.    -  Continue amiodarone 400 mg daily 2 weeks, then decrease to 200 mg daily  -  Follow-up with me in 4 weeks  -  Obtain 48-hour Holter one week prior to follow-up visit to assess for recurrent atrial flutter  -  If monitor demonstrates NSR, consider DC amiodarone after 3-4 weeks of 200 mg daily  -  If monitor demonstrates paroxysmal atrial flutter, consider EP follow-up  -  Decrease Toprol-XL to 50 mg daily (he has been holding this if heart rate 60 or less)  2. Chronic systolic CHF - No signs of volume excess.  NYHA 1.  Continue beta-blocker, ACE inhibitor.  Get FU BMET today.  3. DCM - Likely tachycardia related.  Will need to schedule repeat  limited echo to recheck EF.  Initially, I planned to get FU echo in 8 weeks.  However, when it appeared he was having more AFlutter, I felt it was best to see him back in FU and reassess his rhythm in several weeks.  If NSR on monitor at FU visit, will plan on when to DC Amiodarone and schedule FU echo to recheck EF.  4. Hx of Pancreatitis - Patient to FU with GI (Dr. Christella Hartigan).  He has not made FU visit yet.  I will give him the phone # for [facility].  5. Hx of snoring - Suspect he has sleep apnea.  Will get split night sleep study.   Medication Adjustments/Labs and Tests Ordered: Current medicines are reviewed at length with the patient today.  Concerns regarding medicines are outlined above.  Medication changes, Labs and Tests ordered today are outlined in the Patient Instructions noted below. Patient Instructions  Medication Instructions:  1. STAY ON AMIODARONE 400 MG DAILY FOR 2 MORE WEEKS; THEN YOU WILL DECREASE TO 200 MG DAILY 2. DECREASE TOPROL XL TO 50 MG DAILY Labwork: TODAY BMET Testing/Procedures: 1. Your physician has recommended that you wear a 48 HOUR holter monitor; THIS IS TO BE DONE 1 WEEK BEFORE APPT WITH Reizel Calzada, PAC IN 4 WEEKS. Holter monitors are medical devices that record the heart's electrical activity. Doctors most often use these monitors to diagnose arrhythmias. Arrhythmias are problems with the speed or rhythm of the heartbeat. The monitor is a small, portable device. You can wear one while you do your normal daily activities. This is usually used to diagnose what is causing palpitations/syncope (passing out). 2. Your physician has recommended that you have a SPLIT NIGHT sleep study. This test records several body functions during sleep, including: brain activity, eye movement, oxygen and carbon dioxide blood levels, heart rate and rhythm, breathing rate and rhythm, the flow of  air through your mouth and nose, snoring, body muscle movements, and chest and belly  movement. Follow-Up: 1. Moreen Piggott, PAC 4 WEEKS 2. DR. Eden Emms IN 8 WEEKS  Any Other Special Instructions Will Be Listed Below (If Applicable). YOU HAVE BEEN GIVEN THE PHONE NUMBER TO [facility] ; YOU WILL NEED TO CALL FOR A FOLLOW UP APPT. WITH DR. Christella Hartigan IN THE NEXT 3-4 WEEKS... [phone] If you need a refill on your cardiac medications before your next appointment, please call your pharmacy.    Signed, Tereso Newcomer, PA-C  02/03/2016 2:32 PM    [facility] [address] [place], [place], [place]  [address] Phone: [phone]; Fax: [phone]

## 2016-02-05 ENCOUNTER — Telehealth: Payer: Self-pay | Admitting: Cardiovascular Disease

## 2016-02-05 ENCOUNTER — Telehealth: Payer: Self-pay

## 2016-02-05 ENCOUNTER — Encounter: Payer: Self-pay | Admitting: Physician Assistant

## 2016-02-05 ENCOUNTER — Encounter: Payer: Self-pay | Admitting: Cardiovascular Disease

## 2016-02-05 NOTE — Telephone Encounter (Signed)
Left message for patient to call back  

## 2016-02-05 NOTE — Telephone Encounter (Signed)
Patient stated he was told he could go back to work on 02/06/16. Will forward to [facility] PA to verify.

## 2016-02-05 NOTE — Telephone Encounter (Signed)
Pt calling back regarding work note-he can't do light duty-so he is requesting another note stating he can go back with no restrictions or note for him to be out of work  pls call and let him work  [phone] fax to [employer] [phone]

## 2016-02-05 NOTE — Telephone Encounter (Signed)
-----   Message from [name] sent at 02/03/2016  2:28 PM EDT ----- Regarding: 8 week f/u with Nishan Please help with appt.   Lamar Laundry

## 2016-02-05 NOTE — Telephone Encounter (Signed)
Sent letter to fax number proved by patient.

## 2016-02-05 NOTE — Telephone Encounter (Signed)
Letter is done and in chart. Tereso Newcomer, PA-C   02/05/2016 2:24 PM

## 2016-02-05 NOTE — Telephone Encounter (Signed)
Follow up     Patient was seen in the office with APP - Scott on  6/6  Need a return to work. Asking for a call back from a nurse.

## 2016-02-11 ENCOUNTER — Telehealth: Payer: Self-pay | Admitting: *Deleted

## 2016-02-11 NOTE — Telephone Encounter (Signed)
-----   Message from Beatrice Lecher, [place] sent at 02/11/2016  3:24 PM EDT ----- Regarding: FW: BMET not done Can we find out why his BMET was not done?  Did he not get it drawn? Thanks, Acupuncturist  ----- Message -----    From: Beatrice Lecher, PA-C    Sent: 02/08/2016   6:54 AM      To: Beatrice Lecher, PA-C, Tonita Phoenix Subject: BMET not done                                  Can we find out why his BMET was not done?  Did he not get it drawn? Thanks, Acupuncturist  ----- Message -----    From: SYSTEM    Sent: 02/08/2016  12:05 AM      To: Beatrice Lecher, PA-C

## 2016-02-11 NOTE — Telephone Encounter (Signed)
Spoke with pt and he states that he was not sent to lab after scheduling appts. Asked pt if he could come by this week and get blood work drawn. Pt in agreement to come by Friday afternoon for blood work.  Will route to Tereso Newcomer, PA-C to make him aware.

## 2016-02-11 NOTE — Telephone Encounter (Signed)
Thank you Tereso Newcomer, PA-C   02/11/2016 8:51 PM

## 2016-02-13 ENCOUNTER — Other Ambulatory Visit (INDEPENDENT_AMBULATORY_CARE_PROVIDER_SITE_OTHER): Payer: BLUE CROSS/BLUE SHIELD | Admitting: *Deleted

## 2016-02-13 DIAGNOSIS — I4892 Unspecified atrial flutter: Secondary | ICD-10-CM | POA: Diagnosis not present

## 2016-02-13 DIAGNOSIS — I5022 Chronic systolic (congestive) heart failure: Secondary | ICD-10-CM | POA: Diagnosis not present

## 2016-02-13 LAB — BASIC METABOLIC PANEL
BUN: 13 mg/dL (ref 7–25)
CO2: 23 mmol/L (ref 20–31)
Calcium: 8.8 mg/dL (ref 8.6–10.3)
Chloride: 108 mmol/L (ref 98–110)
Creat: 0.86 mg/dL (ref 0.60–1.35)
Glucose, Bld: 94 mg/dL (ref 65–99)
Potassium: 3.9 mmol/L (ref 3.5–5.3)
Sodium: 140 mmol/L (ref 135–146)

## 2016-02-13 NOTE — Addendum Note (Signed)
Addended by: Tonita Phoenix on: 02/13/2016 04:01 PM   Modules accepted: Orders

## 2016-02-13 NOTE — Addendum Note (Signed)
Addended by: Aryah Doering K on: 02/13/2016 04:01 PM   Modules accepted: Orders  

## 2016-02-16 ENCOUNTER — Telehealth: Payer: Self-pay | Admitting: *Deleted

## 2016-02-16 NOTE — Telephone Encounter (Signed)
Pt has been notified of lab results by phone with verbal understanding. 

## 2016-02-17 ENCOUNTER — Other Ambulatory Visit: Payer: Self-pay | Admitting: *Deleted

## 2016-02-17 DIAGNOSIS — G4733 Obstructive sleep apnea (adult) (pediatric): Secondary | ICD-10-CM

## 2016-02-27 ENCOUNTER — Ambulatory Visit (INDEPENDENT_AMBULATORY_CARE_PROVIDER_SITE_OTHER): Payer: BLUE CROSS/BLUE SHIELD

## 2016-02-27 DIAGNOSIS — I4892 Unspecified atrial flutter: Secondary | ICD-10-CM | POA: Diagnosis not present

## 2016-03-01 ENCOUNTER — Other Ambulatory Visit: Payer: Self-pay | Admitting: *Deleted

## 2016-03-01 MED ORDER — AMIODARONE HCL 200 MG PO TABS: 200.0000 mg | ORAL_TABLET | Freq: Every day | ORAL | Status: DC

## 2016-03-01 MED ORDER — LISINOPRIL 5 MG PO TABS: 5.0000 mg | ORAL_TABLET | Freq: Every day | ORAL | Status: DC

## 2016-03-03 NOTE — Progress Notes (Signed)
Cardiology Office Note:    Date:  03/04/2016   ID:  Nathaniel [name], DOB 1991-07-16, MRN [id number]  PCP:  No PCP Per Patient  Cardiologist: Dr. Charlton Haws  Electrophysiologist: N/a GI: Dr. Christella Hartigan [facility])  Referring MD: No ref. provider found   Chief Complaint  Patient presents with  . Follow-up    CHF, AFlutter    History of Present Illness:     Nathaniel [name] is a 25 y.o. male with a hx of obesity, tobacco abuse, pancreatitis, AFlutter.   Admitted in 5/17 with AFlutter with RVR in the setting of a/c pancreatitis with associated 4.9 cm pseudocyst. Echo demonstrated EF 35-40%. This was felt to be related to elevated HR. He was placed on beta-blocker and ACE inhibitor. He continued to have intermittent AFlutter with RVR and he was placed on Amiodarone. CHADS2-VASc=1 (CHF). He was put on ASA 81 mg QD only. He has a hx of loud snoring and OP sleep study was recommended.   I saw him in FU 02/03/16.  He had a brief episode of rapid rate during my exam but his ECG demonstrated NSR. Amiodarone was continued.  48 Hr Holter was obtained.  This is pending.  He was continued on beta-blocker. He returns for FU.    Here with his fiance'.  Since last seen, he has done well.  However, over the last few days, he started having RUQ pain again.  He had emesis x 1. Denies fevers, chills.  Denies melena, hematochezia, hematemesis. Denies chest pain, dyspnea, syncope.  He denies orthopnea, PND or edema.    Past Medical History  Diagnosis Date  . Pancreatitis 2012    recurrent acute. Admissions to [facility], 12/2015 admissin to [facility] hospital, ct then shows pseudocyst.   . Obesity     BMI 45 12/2015  . Fatty liver 12/2015    per CT  . Atrial flutter (HCC) 01/17/2016    Admx 5/17 with AFlutter with RVR in setting of pancreatitis // CHADS2-VASc=1 (CHF) // Anticoag not started // Amiodarone >> NSR   . Chronic systolic CHF (congestive heart failure) (HCC)     Echo 01/17/16 - EF 35-40%, diffuse HK,  mild LAE   . 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HCC) 02/03/2016    EF 35-40% in setting of AFlutter with RVR in 5/17 // probably tachycardia mediated // needs FU echo 2-3 mos after NSR restored     Past Surgical History  Procedure Laterality Date  . Hand surgery      Current Medications: Outpatient Prescriptions Prior to Visit  Medication Sig Dispense Refill  . amiodarone (PACERONE) 200 MG tablet Take 1 tablet (200 mg total) by mouth daily. 30 tablet 10  . aspirin 81 MG chewable tablet Chew 1 tablet (81 mg total) by mouth daily. 30 tablet 0  . lisinopril (PRINIVIL,ZESTRIL) 5 MG tablet Take 1 tablet (5 mg total) by mouth daily. 30 tablet 10  . metoprolol succinate (TOPROL-XL) 50 MG 24 hr tablet Take 1 tablet (50 mg total) by mouth daily. Take with or immediately following a meal. 90 tablet 3  . ondansetron (ZOFRAN ODT) 8 MG disintegrating tablet Take 1 tablet (8 mg total) by mouth every 8 (eight) hours as needed for nausea or vomiting. 20 tablet 0   No facility-administered medications prior to visit.      Allergies:   Review of patient's allergies indicates no known allergies.   Social History   Social History  . Marital Status: Single    Spouse Name: N/A  .  Number of Children: 0  . Years of Education: 12   Occupational History  .  [employer]   Social History Main Topics  . Smoking status: Former Games developer  . Smokeless tobacco: Current User  . Alcohol Use: Yes  . Drug Use: No  . Sexual Activity: Yes    Birth Control/ Protection: None   Other Topics Concern  . None   Social History Narrative     Family History:  The patient's family history includes Diabetes in his mother.   ROS:   Please see the history of present illness.    Review of Systems  Musculoskeletal: Positive for back pain and joint pain.  Gastrointestinal: Positive for abdominal pain.   All other systems reviewed and are negative.   Physical Exam:    VS:  BP 100/70 mmHg  Pulse 54  Ht 5\' 6"   (1.676 m)  Wt 306 lb 6.4 oz (138.982 kg)  BMI 49.48 kg/m2   Physical Exam  Constitutional: He is oriented to person, place, and time. He appears well-developed and well-nourished.  HENT:  Head: Normocephalic and atraumatic.  Neck: No JVD present.  Cardiovascular: Normal rate, regular rhythm and normal heart sounds.   No murmur heard. Pulmonary/Chest: Effort normal and breath sounds normal. He has no wheezes. He has no rales.  Abdominal: Soft. Bowel sounds are normal. There is no tenderness.  Musculoskeletal: He exhibits no edema.  Neurological: He is alert and oriented to person, place, and time.  Skin: Skin is warm and dry.  Psychiatric: He has a normal mood and affect.    Wt Readings from Last 3 Encounters:  03/04/16 306 lb 6.4 oz (138.982 kg)  02/03/16 311 lb (141.069 kg)  01/27/16 316 lb 6.4 oz (143.518 kg)     Studies/Labs Reviewed:     EKG:  EKG is  ordered today.  The ekg ordered today demonstrates sinus bradycardia, HR 53, normal axis, QTC 420 ms  Recent Labs: 01/18/2016: ALT 20; Magnesium 1.8; TSH 1.653 01/19/2016: B Natriuretic Peptide 146.9* 01/26/2016: Hemoglobin 12.1*; Platelets 370 02/13/2016: BUN 13; Creat 0.86; Potassium 3.9; Sodium 140   Recent Lipid Panel    Component Value Date/Time   CHOL 128 01/17/2016 1238   TRIG 100 01/23/2016 0438   HDL 37* 01/17/2016 1238   CHOLHDL 3.5 01/17/2016 1238   VLDL 26 01/17/2016 1238   LDLCALC 65 01/17/2016 1238    Additional studies/ records that were reviewed today include:   Echo 01/17/16 EF 35-40%, diffuse HK, mild LAE  Holter 6/17 NSR, AFib/Flutter, HR up to 150 at times  ASSESSMENT:     1. Atypical atrial flutter (HCC)   2. Chronic systolic CHF (congestive heart failure) (HCC)   3. 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HCC)   4. History of pancreatitis   5. Snoring   6. On amiodarone therapy     PLAN:     In order of problems listed above:  1. AFlutter - He was admitted in 5/17 with a/c pancreatitis c/b  AFlutter with RVR. Rate was difficult to control. With low stroke risk, chronic anticoagulation was not started and he was placed on ASA only. He required Amiodarone for rate and rhythm control. Recent Holter monitor demonstrates paroxysmal episodes of atrial fibrillation/flutter. His heart rate does get quite fast at times. Given his young age, it would be best to not continue long-term amiodarone. His atrial flutter looked to be atypical. I am not certain that ablation can be offered to him. In any event, I think he  would benefit from referral to EP for further recommendations regarding management of his rhythm.  -  Continue amiodarone  -  Refer to EP for further recommendations regarding atrial flutter  -  Obtain TSH, LFTs today.  2. Chronic systolic CHF - No signs of volume excess. NYHA 1. Continue beta-blocker, ACE inhibitor.   3. DCM - Likely tachycardia related. I will arrange follow-up limited echo to reassess LV function.  4. Hx of Pancreatitis - He has had recurrent abdominal pain. Obtain LFTs, amylase, lipase. Arrange earlier follow-up with GI.  5. Hx of snoring - Sleep study is pending.     6. Amiodarone Rx  - Check LFTs, TSH today.   Medication Adjustments/Labs and Tests Ordered: Current medicines are reviewed at length with the patient today.  Concerns regarding medicines are outlined above.  Medication changes, Labs and Tests ordered today are outlined in the Patient Instructions noted below. Patient Instructions  Medication Instructions:  Your physician recommends that you continue on your current medications as directed. Please refer to the Current Medication list given to you today. Labwork: TODAY BMET, LFT, TSH, AMYLASE, LIPASE Testing/Procedures: 1. Your physician has requested that you have an LIMITED echocardiogram. Echocardiography is a painless test that uses sound waves to create images of your heart. It provides your doctor with information about  the size and shape of your heart and how well your heart's chambers and valves are working. This procedure takes approximately one hour. There are no restrictions for this procedure. Follow-Up: 1. YOU HAVE AN APPT WITH GI 03/16/16 @ 8:30 AM WITH JESSICA ZEHR, PAC 2. YOU ARE BEING REFERRED TO ELECTROPHYSIOLOGY; DX A-FLUTTER, DILATED CARDIOMYOPATHY Any Other Special Instructions Will Be Listed Below (If Applicable). If you need a refill on your cardiac medications before your next appointment, please call your pharmacy.   Signed, Tereso Newcomer, PA-C  03/04/2016 5:43 PM    [facility] [address] [place], [place], [place]  [address] Phone: [phone]; Fax: [phone]

## 2016-03-04 ENCOUNTER — Encounter: Payer: Self-pay | Admitting: Physician Assistant

## 2016-03-04 ENCOUNTER — Encounter (INDEPENDENT_AMBULATORY_CARE_PROVIDER_SITE_OTHER): Payer: Self-pay

## 2016-03-04 ENCOUNTER — Ambulatory Visit (INDEPENDENT_AMBULATORY_CARE_PROVIDER_SITE_OTHER): Payer: BLUE CROSS/BLUE SHIELD | Admitting: Physician Assistant

## 2016-03-04 VITALS — BP 100/70 | HR 54 | Ht 66.0 in | Wt 306.4 lb

## 2016-03-04 DIAGNOSIS — Z8719 Personal history of other diseases of the digestive system: Secondary | ICD-10-CM

## 2016-03-04 DIAGNOSIS — I484 Atypical atrial flutter: Secondary | ICD-10-CM

## 2016-03-04 DIAGNOSIS — I5022 Chronic systolic (congestive) heart failure: Secondary | ICD-10-CM | POA: Diagnosis not present

## 2016-03-04 DIAGNOSIS — R0683 Snoring: Secondary | ICD-10-CM | POA: Diagnosis not present

## 2016-03-04 DIAGNOSIS — Z79899 Other long term (current) drug therapy: Secondary | ICD-10-CM

## 2016-03-04 DIAGNOSIS — I420 Dilated cardiomyopathy: Secondary | ICD-10-CM

## 2016-03-04 NOTE — Patient Instructions (Addendum)
Medication Instructions:  Your physician recommends that you continue on your current medications as directed. Please refer to the Current Medication list given to you today. Labwork: TODAY BMET, LFT, TSH, AMYLASE, LIPASE Testing/Procedures: 1. Your physician has requested that you have an LIMITED echocardiogram. Echocardiography is a painless test that uses sound waves to create images of your heart. It provides your doctor with information about the size and shape of your heart and how well your heart's chambers and valves are working. This procedure takes approximately one hour. There are no restrictions for this procedure. Follow-Up: 1. YOU HAVE AN APPT WITH GI 03/16/16 @ 8:30 AM WITH JESSICA ZEHR, PAC 2. YOU ARE BEING REFERRED TO ELECTROPHYSIOLOGY; DX A-FLUTTER, DILATED CARDIOMYOPATHY Any Other Special Instructions Will Be Listed Below (If Applicable). If you need a refill on your cardiac medications before your next appointment, please call your pharmacy.

## 2016-03-05 ENCOUNTER — Encounter (HOSPITAL_COMMUNITY): Payer: Self-pay | Admitting: Oncology

## 2016-03-05 ENCOUNTER — Emergency Department (HOSPITAL_COMMUNITY): Payer: BLUE CROSS/BLUE SHIELD

## 2016-03-05 ENCOUNTER — Telehealth: Payer: Self-pay | Admitting: *Deleted

## 2016-03-05 ENCOUNTER — Inpatient Hospital Stay (HOSPITAL_COMMUNITY)
Admission: EM | Admit: 2016-03-05 | Discharge: 2016-03-07 | DRG: 439 | Disposition: A | Discharge status: Home / Self Care | Payer: BLUE CROSS/BLUE SHIELD | Attending: Internal Medicine | Admitting: Internal Medicine

## 2016-03-05 DIAGNOSIS — Z6841 Body Mass Index (BMI) 40.0 and over, adult: Secondary | ICD-10-CM

## 2016-03-05 DIAGNOSIS — Z833 Family history of diabetes mellitus: Secondary | ICD-10-CM | POA: Diagnosis not present

## 2016-03-05 DIAGNOSIS — Z79899 Other long term (current) drug therapy: Secondary | ICD-10-CM

## 2016-03-05 DIAGNOSIS — K858 Other acute pancreatitis without necrosis or infection: Secondary | ICD-10-CM | POA: Diagnosis not present

## 2016-03-05 DIAGNOSIS — R109 Unspecified abdominal pain: Secondary | ICD-10-CM | POA: Diagnosis present

## 2016-03-05 DIAGNOSIS — K863 Pseudocyst of pancreas: Secondary | ICD-10-CM | POA: Diagnosis present

## 2016-03-05 DIAGNOSIS — K8500 Idiopathic acute pancreatitis without necrosis or infection: Secondary | ICD-10-CM

## 2016-03-05 DIAGNOSIS — Z87891 Personal history of nicotine dependence: Secondary | ICD-10-CM | POA: Diagnosis not present

## 2016-03-05 DIAGNOSIS — R1013 Epigastric pain: Secondary | ICD-10-CM | POA: Diagnosis not present

## 2016-03-05 DIAGNOSIS — I5022 Chronic systolic (congestive) heart failure: Secondary | ICD-10-CM

## 2016-03-05 DIAGNOSIS — I110 Hypertensive heart disease with heart failure: Secondary | ICD-10-CM | POA: Diagnosis present

## 2016-03-05 DIAGNOSIS — R935 Abnormal findings on diagnostic imaging of other abdominal regions, including retroperitoneum: Secondary | ICD-10-CM | POA: Diagnosis not present

## 2016-03-05 DIAGNOSIS — K852 Alcohol induced acute pancreatitis without necrosis or infection: Principal | ICD-10-CM | POA: Diagnosis present

## 2016-03-05 DIAGNOSIS — Z7982 Long term (current) use of aspirin: Secondary | ICD-10-CM

## 2016-03-05 DIAGNOSIS — K859 Acute pancreatitis without necrosis or infection, unspecified: Secondary | ICD-10-CM | POA: Diagnosis not present

## 2016-03-05 DIAGNOSIS — F1021 Alcohol dependence, in remission: Secondary | ICD-10-CM | POA: Diagnosis present

## 2016-03-05 DIAGNOSIS — I4891 Unspecified atrial fibrillation: Secondary | ICD-10-CM | POA: Diagnosis present

## 2016-03-05 DIAGNOSIS — K860 Alcohol-induced chronic pancreatitis: Secondary | ICD-10-CM | POA: Diagnosis present

## 2016-03-05 DIAGNOSIS — R161 Splenomegaly, not elsewhere classified: Secondary | ICD-10-CM | POA: Diagnosis not present

## 2016-03-05 HISTORY — DX: Acute pancreatitis without necrosis or infection, unspecified: K85.90

## 2016-03-05 LAB — TROPONIN I: Troponin I: <0.03 ng/mL (ref <0.03)

## 2016-03-05 LAB — COMPREHENSIVE METABOLIC PANEL
ALT: 20 U/L (ref 17–63)
AST: 16 U/L (ref 15–41)
Albumin: 4.1 g/dL (ref 3.5–5.0)
Alkaline Phosphatase: 43 U/L (ref 38–126)
Anion gap: 8 (ref 5–15)
BUN: 11 mg/dL (ref 6–20)
CO2: 23 mmol/L (ref 22–32)
Calcium: 8.5 mg/dL — ABNORMAL LOW (ref 8.9–10.3)
Chloride: 109 mmol/L (ref 101–111)
Creatinine, Ser: 0.7 mg/dL (ref 0.61–1.24)
GFR calc Af Amer: >60 mL/min (ref >60)
GFR calc non Af Amer: >60 mL/min (ref >60)
Glucose, Bld: 95 mg/dL (ref 65–99)
Potassium: 3.7 mmol/L (ref 3.5–5.1)
Sodium: 140 mmol/L (ref 135–145)
Total Bilirubin: 0.6 mg/dL (ref 0.3–1.2)
Total Protein: 7 g/dL (ref 6.5–8.1)

## 2016-03-05 LAB — LIPASE, BLOOD
Lipase: 4210 U/L — ABNORMAL HIGH (ref 11–51)
Lipase: 379 U/L — ABNORMAL HIGH (ref 11–51)

## 2016-03-05 LAB — LIPID PANEL
Cholesterol: 110 mg/dL (ref 0–200)
HDL: 30 mg/dL — ABNORMAL LOW (ref >40)
LDL Cholesterol: 63 mg/dL (ref 0–99)
Total CHOL/HDL Ratio: 3.7 RATIO
Triglycerides: 85 mg/dL (ref <150)
VLDL: 17 mg/dL (ref 0–40)

## 2016-03-05 LAB — BASIC METABOLIC PANEL
Anion gap: 5 (ref 5–15)
BUN: 10 mg/dL (ref 7–25)
BUN: 10 mg/dL (ref 6–20)
CO2: 22 mmol/L (ref 20–31)
CO2: 25 mmol/L (ref 22–32)
Calcium: 8.5 mg/dL — ABNORMAL LOW (ref 8.6–10.3)
Calcium: 8.1 mg/dL — ABNORMAL LOW (ref 8.9–10.3)
Chloride: 109 mmol/L (ref 98–110)
Chloride: 108 mmol/L (ref 101–111)
Creat: 0.67 mg/dL (ref 0.60–1.35)
Creatinine, Ser: 0.64 mg/dL (ref 0.61–1.24)
GFR calc Af Amer: >60 mL/min (ref >60)
GFR calc non Af Amer: >60 mL/min (ref >60)
Glucose, Bld: 97 mg/dL (ref 65–99)
Glucose, Bld: 106 mg/dL — ABNORMAL HIGH (ref 65–99)
Potassium: 4.2 mmol/L (ref 3.5–5.3)
Potassium: 3.8 mmol/L (ref 3.5–5.1)
Sodium: 141 mmol/L (ref 135–146)
Sodium: 138 mmol/L (ref 135–145)

## 2016-03-05 LAB — CBC WITH DIFFERENTIAL/PLATELET
Basophils Absolute: 0 10*3/uL (ref 0.0–0.1)
Basophils Relative: 0 %
Eosinophils Absolute: 0.4 10*3/uL (ref 0.0–0.7)
Eosinophils Relative: 6 %
HCT: 36.6 % — ABNORMAL LOW (ref 39.0–52.0)
Hemoglobin: 12 g/dL — ABNORMAL LOW (ref 13.0–17.0)
Lymphocytes Relative: 18 %
Lymphs Abs: 1.4 10*3/uL (ref 0.7–4.0)
MCH: 29.2 pg (ref 26.0–34.0)
MCHC: 32.8 g/dL (ref 30.0–36.0)
MCV: 89.1 fL (ref 78.0–100.0)
Monocytes Absolute: 0.7 10*3/uL (ref 0.1–1.0)
Monocytes Relative: 9 %
Neutro Abs: 5.2 10*3/uL (ref 1.7–7.7)
Neutrophils Relative %: 67 %
Platelets: 247 10*3/uL (ref 150–400)
RBC: 4.11 MIL/uL — ABNORMAL LOW (ref 4.22–5.81)
RDW: 13.8 % (ref 11.5–15.5)
WBC: 7.7 10*3/uL (ref 4.0–10.5)

## 2016-03-05 LAB — HEPATIC FUNCTION PANEL
ALT: 19 U/L (ref 9–46)
ALT: 19 U/L (ref 17–63)
AST: 14 U/L (ref 10–40)
AST: 15 U/L (ref 15–41)
Albumin: 4.1 g/dL (ref 3.6–5.1)
Albumin: 3.5 g/dL (ref 3.5–5.0)
Alkaline Phosphatase: 40 U/L (ref 40–115)
Alkaline Phosphatase: 38 U/L (ref 38–126)
Bilirubin, Direct: 0.1 mg/dL (ref <=0.2)
Bilirubin, Direct: 0.2 mg/dL (ref 0.1–0.5)
Indirect Bilirubin: 0.4 mg/dL (ref 0.2–1.2)
Indirect Bilirubin: 0.6 mg/dL (ref 0.3–0.9)
Total Bilirubin: 0.5 mg/dL (ref 0.2–1.2)
Total Bilirubin: 0.8 mg/dL (ref 0.3–1.2)
Total Protein: 6.5 g/dL (ref 6.1–8.1)
Total Protein: 6.2 g/dL — ABNORMAL LOW (ref 6.5–8.1)

## 2016-03-05 LAB — URINALYSIS, ROUTINE W REFLEX MICROSCOPIC
Bilirubin Urine: NEGATIVE
Glucose, UA: NEGATIVE mg/dL
Hgb urine dipstick: NEGATIVE
Ketones, ur: NEGATIVE mg/dL
Leukocytes, UA: NEGATIVE
Nitrite: NEGATIVE
Protein, ur: NEGATIVE mg/dL
Specific Gravity, Urine: 1.041 — ABNORMAL HIGH (ref 1.005–1.030)
pH: 6 (ref 5.0–8.0)

## 2016-03-05 LAB — CBC
HCT: 41.1 % (ref 39.0–52.0)
Hemoglobin: 13.6 g/dL (ref 13.0–17.0)
MCH: 29.3 pg (ref 26.0–34.0)
MCHC: 33.1 g/dL (ref 30.0–36.0)
MCV: 88.6 fL (ref 78.0–100.0)
Platelets: 229 10*3/uL (ref 150–400)
RBC: 4.64 MIL/uL (ref 4.22–5.81)
RDW: 13.7 % (ref 11.5–15.5)
WBC: 12.2 10*3/uL — ABNORMAL HIGH (ref 4.0–10.5)

## 2016-03-05 LAB — LIPASE: Lipase: 548 U/L — ABNORMAL HIGH (ref 7–60)

## 2016-03-05 LAB — TSH: TSH: 2.7 mIU/L (ref 0.40–4.50)

## 2016-03-05 LAB — AMYLASE: Amylase: 184 U/L — ABNORMAL HIGH (ref 0–105)

## 2016-03-05 MED ORDER — ONDANSETRON HCL 4 MG/2ML IJ SOLN
4.0000 mg | Freq: Once | INTRAMUSCULAR | Status: AC
  Administered 2016-03-05: 4 mg via INTRAVENOUS
  Filled 2016-03-05: qty 2

## 2016-03-05 MED ORDER — HYDROMORPHONE HCL 1 MG/ML IJ SOLN
1.0000 mg | Freq: Once | INTRAMUSCULAR | Status: AC
  Administered 2016-03-05: 1 mg via INTRAVENOUS
  Filled 2016-03-05: qty 1

## 2016-03-05 MED ORDER — IOPAMIDOL (ISOVUE-300) INJECTION 61%
100.0000 mL | Freq: Once | INTRAVENOUS | Status: AC | PRN
  Administered 2016-03-05: 100 mL via INTRAVENOUS

## 2016-03-05 MED ORDER — ONDANSETRON 4 MG PO TBDP
8.0000 mg | ORAL_TABLET | Freq: Three times a day (TID) | ORAL | Status: DC | PRN
  Administered 2016-03-05: 8 mg via ORAL
  Filled 2016-03-05 (×2): qty 2

## 2016-03-05 MED ORDER — ACETAMINOPHEN 325 MG PO TABS: 650.0000 mg | ORAL_TABLET | Freq: Four times a day (QID) | ORAL | Status: DC | PRN

## 2016-03-05 MED ORDER — VITAMIN B-1 100 MG PO TABS
100.0000 mg | ORAL_TABLET | Freq: Every day | ORAL | Status: DC
  Administered 2016-03-06 – 2016-03-07 (×2): 100 mg via ORAL
  Filled 2016-03-05 (×2): qty 1

## 2016-03-05 MED ORDER — SODIUM CHLORIDE 0.9 % IV BOLUS (SEPSIS)
500.0000 mL | Freq: Once | INTRAVENOUS | Status: AC
  Administered 2016-03-05: 500 mL via INTRAVENOUS

## 2016-03-05 MED ORDER — SODIUM CHLORIDE 0.9 % IV SOLN
INTRAVENOUS | Status: AC
  Administered 2016-03-05 – 2016-03-06 (×3): via INTRAVENOUS

## 2016-03-05 MED ORDER — ACETAMINOPHEN 650 MG RE SUPP: 650.0000 mg | Freq: Four times a day (QID) | RECTAL | Status: DC | PRN

## 2016-03-05 MED ORDER — METOPROLOL SUCCINATE ER 50 MG PO TB24
50.0000 mg | ORAL_TABLET | Freq: Every day | ORAL | Status: DC
  Administered 2016-03-05 – 2016-03-07 (×3): 50 mg via ORAL
  Filled 2016-03-05 (×4): qty 1

## 2016-03-05 MED ORDER — ONDANSETRON HCL 4 MG/2ML IJ SOLN
4.0000 mg | Freq: Four times a day (QID) | INTRAMUSCULAR | Status: DC | PRN
  Administered 2016-03-05 (×2): 4 mg via INTRAVENOUS
  Filled 2016-03-05 (×2): qty 2

## 2016-03-05 MED ORDER — THIAMINE HCL 100 MG/ML IJ SOLN
100.0000 mg | Freq: Every day | INTRAMUSCULAR | Status: DC
  Administered 2016-03-05: 100 mg via INTRAVENOUS
  Filled 2016-03-05: qty 2

## 2016-03-05 MED ORDER — PROCHLORPERAZINE EDISYLATE 5 MG/ML IJ SOLN
10.0000 mg | INTRAMUSCULAR | Status: DC | PRN
  Administered 2016-03-05: 10 mg via INTRAVENOUS
  Filled 2016-03-05: qty 2

## 2016-03-05 MED ORDER — AMIODARONE HCL 200 MG PO TABS
200.0000 mg | ORAL_TABLET | Freq: Every day | ORAL | Status: DC
  Administered 2016-03-05 – 2016-03-07 (×3): 200 mg via ORAL
  Filled 2016-03-05 (×3): qty 1

## 2016-03-05 MED ORDER — MORPHINE SULFATE (PF) 2 MG/ML IV SOLN
2.0000 mg | INTRAVENOUS | Status: DC | PRN
  Administered 2016-03-05 – 2016-03-06 (×7): 2 mg via INTRAVENOUS
  Filled 2016-03-05 (×10): qty 1

## 2016-03-05 MED ORDER — OMEGA-3-ACID ETHYL ESTERS 1 G PO CAPS
1.0000 g | ORAL_CAPSULE | Freq: Every day | ORAL | Status: DC
  Administered 2016-03-05 – 2016-03-07 (×3): 1 g via ORAL
  Filled 2016-03-05 (×3): qty 1

## 2016-03-05 MED ORDER — ONDANSETRON HCL 4 MG PO TABS: 4.0000 mg | ORAL_TABLET | Freq: Four times a day (QID) | ORAL | Status: DC | PRN

## 2016-03-05 MED ORDER — LISINOPRIL 10 MG PO TABS
5.0000 mg | ORAL_TABLET | Freq: Every day | ORAL | Status: DC
  Administered 2016-03-05 – 2016-03-07 (×3): 5 mg via ORAL
  Filled 2016-03-05 (×3): qty 1

## 2016-03-05 NOTE — ED Notes (Signed)
Pt aware of need for urine sample but is unable to provide on at this time.

## 2016-03-05 NOTE — Progress Notes (Signed)
Pt HR ranges from SB/Afib--40's to 50's. MD aware. Will cont to monitor. Pt arouses easily after pain med. Vss.

## 2016-03-05 NOTE — H&P (Signed)
History and Physical    Nathaniel [name] UJW:[id number] DOB: [birth year] DOA: 03/05/2016  PCP: No PCP Per Patient  Patient coming from: Home.  Chief Complaint: Abdominal pain.  HPI: Nathaniel [name] is a 25 y.o. male with pancreatitis and was admitted 6 weeks ago for pancreatitis secondary to alcoholism and at that time CT showed pseudocyst formation and patient also had A. fib and 2-D echo showed CHF presents to the ER because of abdominal pain. Patient has been having epigastric pain radiating to the back over the last 2 days. Denies any associated nausea vomiting. Denies any chest pain or shortness of breath fever chills. Patient states he has not had any alcohol for last 6 weeks since last admission. He has been taking his medications for cardiomyopathy and A. fib medications including amiodarone lisinopril and metoprolol. CT of the abdomen and pelvis done in the ER shows pseudocyst which is enlarging with features consistent with acute pancreatitis. Lipase is more than 4000. Patient is being admitted for further management.   ED Course: Patient was given fluid bolus and pain relief medications. Had CT abdomen and pelvis.  Review of Systems: As per HPI, rest all negative.   Past Medical History  Diagnosis Date  . Pancreatitis 2012    recurrent acute. Admissions to [facility], 12/2015 admissin to [facility] hospital, ct then shows pseudocyst.   . Obesity     BMI 45 12/2015  . Fatty liver 12/2015    per CT  . Atrial flutter (HCC) 01/17/2016    Admx 5/17 with AFlutter with RVR in setting of pancreatitis // CHADS2-VASc=1 (CHF) // Anticoag not started // Amiodarone >> NSR   . Chronic systolic CHF (congestive heart failure) (HCC)     Echo 01/17/16 - EF 35-40%, diffuse HK, mild LAE   . 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HCC) 02/03/2016    EF 35-40% in setting of AFlutter with RVR in 5/17 // probably tachycardia mediated // needs FU echo 2-3 mos after NSR restored     Past Surgical History  Procedure Laterality  Date  . Hand surgery       reports that he has quit smoking. He uses smokeless tobacco. He reports that he drinks alcohol. He reports that he does not use illicit drugs.  No Known Allergies  Family History  Problem Relation Age of Onset  . Diabetes Mother     Prior to Admission medications   Medication Sig Start Date End Date Taking? Authorizing Provider  amiodarone (PACERONE) 200 MG tablet Take 1 tablet (200 mg total) by mouth daily. 03/01/16  Yes Beatrice Lecher, PA-C  aspirin 81 MG chewable tablet Chew 1 tablet (81 mg total) by mouth daily. 01/27/16  Yes Marinda Elk, MD  ibuprofen (ADVIL,MOTRIN) 200 MG tablet Take 400 mg by mouth every 6 (six) hours as needed for mild pain.   Yes Historical Provider, MD  lisinopril (PRINIVIL,ZESTRIL) 5 MG tablet Take 1 tablet (5 mg total) by mouth daily. 03/01/16  Yes Beatrice Lecher, PA-C  metoprolol succinate (TOPROL-XL) 50 MG 24 hr tablet Take 1 tablet (50 mg total) by mouth daily. Take with or immediately following a meal. 02/03/16  Yes Beatrice Lecher, PA-C  Omega-3 Fatty Acids (FISH OIL PO) Take 1 capsule by mouth daily.   Yes Historical Provider, MD  ondansetron (ZOFRAN ODT) 8 MG disintegrating tablet Take 1 tablet (8 mg total) by mouth every 8 (eight) hours as needed for nausea or vomiting. 01/27/16  Yes Marinda Elk, MD  traMADol (  ULTRAM) 50 MG tablet Take 50 mg by mouth every 6 (six) hours as needed for moderate pain.   Yes Historical Provider, MD    Physical Exam: Filed Vitals:   03/05/16 0025 03/05/16 0130 03/05/16 0418 03/05/16 0517  BP: 150/109 134/88 115/97 122/77  Pulse: 71 71 89 62  Temp: 98.4 F (36.9 C)  98.1 F (36.7 C) 98.2 F (36.8 C)  TempSrc: Oral  Oral Oral  Resp: 16 16 15 16   Height: 6' (1.829 m)   6' (1.829 m)  Weight: 306 lb 6.4 oz (138.982 kg)   306 lb 7 oz (139 kg)  SpO2: 100% 95% 95% 98%      Constitutional: Not in distress. Filed Vitals:   03/05/16 0025 03/05/16 0130 03/05/16 0418 03/05/16 0517  BP:  150/109 134/88 115/97 122/77  Pulse: 71 71 89 62  Temp: 98.4 F (36.9 C)  98.1 F (36.7 C) 98.2 F (36.8 C)  TempSrc: Oral  Oral Oral  Resp: 16 16 15 16   Height: 6' (1.829 m)   6' (1.829 m)  Weight: 306 lb 6.4 oz (138.982 kg)   306 lb 7 oz (139 kg)  SpO2: 100% 95% 95% 98%   Eyes: Anicteric no pallor. ENMT: No discharge from the ears eyes nose and mouth. Neck: No mass felt. No neck rigidity no JVD appreciated. Respiratory: No rhonchi or crepitations. Cardiovascular: S1 and S2 heard. Abdomen: Soft mild epigastric tenderness no guarding or rigidity. Musculoskeletal: No edema. Skin: No rash. Neurologic: Alert awake oriented to time place and person. Moves all extremities. Psychiatric: Appears normal.   Labs on Admission: I have personally reviewed following labs and imaging studies  CBC:  Recent Labs Lab 03/05/16 0043  WBC 12.2*  HGB 13.6  HCT 41.1  MCV 88.6  PLT 229   Basic Metabolic Panel:  Recent Labs Lab 03/04/16 1505 03/05/16 0043  NA 141 140  K 4.2 3.7  CL 109 109  CO2 22 23  GLUCOSE 97 95  BUN 10 11  CREATININE 0.67 0.70  CALCIUM 8.5* 8.5*   GFR: Estimated Creatinine Clearance: 204 mL/min (by C-G formula based on Cr of 0.7). Liver Function Tests:  Recent Labs Lab 03/04/16 1505 03/05/16 0043  AST 14 16  ALT 19 20  ALKPHOS 40 43  BILITOT 0.5 0.6  PROT 6.5 7.0  ALBUMIN 4.1 4.1    Recent Labs Lab 03/04/16 1505 03/05/16 0043  LIPASE 548* 4210*  AMYLASE 184*  --    No results for input(s): AMMONIA in the last 168 hours. Coagulation Profile: No results for input(s): INR, PROTIME in the last 168 hours. Cardiac Enzymes: No results for input(s): CKTOTAL, CKMB, CKMBINDEX, TROPONINI in the last 168 hours. BNP (last 3 results) No results for input(s): PROBNP in the last 8760 hours. HbA1C: No results for input(s): HGBA1C in the last 72 hours. CBG: No results for input(s): GLUCAP in the last 168 hours. Lipid Profile: No results for input(s):  CHOL, HDL, LDLCALC, TRIG, CHOLHDL, LDLDIRECT in the last 72 hours. Thyroid Function Tests:  Recent Labs  03/04/16 1505  TSH 2.70   Anemia Panel: No results for input(s): VITAMINB12, FOLATE, FERRITIN, TIBC, IRON, RETICCTPCT in the last 72 hours. Urine analysis:    Component Value Date/Time   COLORURINE YELLOW 03/05/2016 0423   APPEARANCEUR CLEAR 03/05/2016 0423   LABSPEC 1.041* 03/05/2016 0423   PHURINE 6.0 03/05/2016 0423   GLUCOSEU NEGATIVE 03/05/2016 0423   HGBUR NEGATIVE 03/05/2016 0423   BILIRUBINUR NEGATIVE 03/05/2016 0423  KETONESUR NEGATIVE 03/05/2016 0423   PROTEINUR NEGATIVE 03/05/2016 0423   UROBILINOGEN 1.0 01/14/2014 0032   NITRITE NEGATIVE 03/05/2016 0423   LEUKOCYTESUR NEGATIVE 03/05/2016 0423   Sepsis Labs: @LABRCNTIP (procalcitonin:4,lacticidven:4) )No results found for this or any previous visit (from the past 240 hour(s)).   Radiological Exams on Admission: Ct Abdomen Pelvis W Contrast  03/05/2016  CLINICAL DATA:  Acute onset of right upper quadrant abdominal pain. Leukocytosis and elevated lipase. Initial encounter. EXAM: CT ABDOMEN AND PELVIS WITH CONTRAST TECHNIQUE: Multidetector CT imaging of the abdomen and pelvis was performed using the standard protocol following bolus administration of intravenous contrast. CONTRAST:  ISOVUE-300 IOPAMIDOL (ISOVUE-300) INJECTION 61% COMPARISON:  CT of the abdomen and pelvis performed 01/17/2016, and abdominal ultrasound performed 01/23/2016 FINDINGS: Mild bibasilar atelectasis is noted. Mild soft tissue inflammation is noted about the pancreas, with inflammation extending about the antrum of the stomach. There appears to be a 7.9 x 3.4 x 4.2 cm pseudocyst adjacent to the antrum of the stomach, with surrounding soft inflammation. Findings are compatible with acute pancreatitis. There is no evidence of devascularization. The liver is unremarkable in appearance. The spleen is mildly enlarged, measuring 13.0 cm in length. The  gallbladder is within normal limits. The adrenal glands are unremarkable. The kidneys are unremarkable in appearance. There is no evidence of hydronephrosis. No renal or ureteral stones are seen. No perinephric stranding is appreciated. No free fluid is identified. The small bowel is unremarkable in appearance. The stomach is within normal limits. No acute vascular abnormalities are seen. The appendix is normal in caliber, without evidence of appendicitis. The colon is grossly unremarkable in appearance. The bladder is mildly distended and grossly unremarkable. The prostate remains normal in size. No inguinal lymphadenopathy is seen. No acute osseous abnormalities are identified. IMPRESSION: 1. 7.9 x 3.4 x 4.2 cm pseudocyst adjacent to the antrum of the stomach, with surrounding soft inflammation. Mild soft tissue inflammation about the pancreas. Findings compatible with acute pancreatitis. No evidence of devascularization at this time. 2. Mild splenomegaly. 3. Mild bibasilar atelectasis noted. Electronically Signed   By: Roanna Raider M.D.   On: 03/05/2016 04:07     Assessment/Plan Principal Problem:   Acute pancreatitis without infection or necrosis Active Problems:   Chronic systolic CHF (congestive heart failure) (HCC)   Acute pancreatitis    1. Acute on chronic pancreatitis with pseudocyst formation - patient states he has not had any alcohol for last 6 weeks. Pseudocyst is enlarging. Will need to consult gastroenterologist in a.m. No signs of any necrosis or infection. Patient is kept nothing by mouth with IV pain medications. Will gently hydrate for next 12 hours caution that patient has cardiomyopathy. Check lipid panel for triglycerides. Pancreatitis most likely from previous history of alcoholism. 2. Chronic systolic heart failure last EF measured was 35-40% in May 2017 possibly from alcoholism or rate related - patient does not look fluid overloaded. Continue lisinopril. 3. History of  atrial fibrillation - rate controlled. Chads 2 vasc score is 1. Continue amiodarone metoprolol. Hold off aspirin for now in anticipation of possible procedure. 4. History of alcoholism and has not had any alcohol for last 6 weeks. Will place patient on IV thiamine.  EKG is pending.   DVT prophylaxis: SCDs. Code Status: Full code.  Family Communication: Patient's family at the bedside.  Disposition Plan: Home.  Consults called: None.  Admission status: Inpatient. Telemetry. Likely stage II to 3 days.    Eduard Clos MD [facility] Pager [phone].  If 7PM-7AM, please contact night-coverage [email or web address] Password [email or web address]  03/05/2016, 6:08 AM

## 2016-03-05 NOTE — Telephone Encounter (Signed)
Recv'd call from [facility] lab this morning in regards to critical lab. Lipase is 548. I have shown this to DOD today as well as pt is currently admitted for pancreatitis.

## 2016-03-05 NOTE — ED Notes (Signed)
Pt presents d/t RUQ abdominal pain since yesterday at 2000.  Pt w/ hx of pancreatitis and states this pain feels similar.  Rates pain 7/10, aching, throbbing and stabbing in nature w/ radiation to his back.
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
## 2016-03-05 NOTE — Progress Notes (Signed)
PROGRESS NOTE    Nathaniel [name]  WUX:[id number] DOB: May 23, 1991 DOA: 03/05/2016 PCP: No PCP Per Patient    Brief Narrative:  Nathaniel [name] is a 25 y.o. male with pancreatitis and was admitted 6 weeks prior to admission prior to admission for pancreatitis secondary to alcoholism and at that time CT showed pseudocyst formation and patient also had A. fib and 2-D echo showed CHF presents to the ER because of abdominal pain. Patient has been having epigastric pain radiating to the back over the last 2 days. Denies any associated nausea vomiting. Denies any chest pain or shortness of breath fever chills. Patient states he has not had any alcohol for last 6 weeks since last admission. He has been taking his medications for cardiomyopathy and A. fib medications including amiodarone lisinopril and metoprolol. CT of the abdomen and pelvis done in the ER shows pseudocyst which is enlarging with features consistent with acute pancreatitis. Lipase is more than 4000. Patient is being admitted for further management.    Assessment & Plan:   Principal Problem:   Acute pancreatitis without infection or necrosis Active Problems:   Chronic systolic CHF (congestive heart failure) (HCC)   Acute pancreatitis  1. Acute on chronic pancreatitis with pseudocyst formation - patient states he has not had any alcohol for last 6 weeks. Pseudocyst is enlarging. No signs of any necrosis or infection. Patient is kept nothing by mouth with IV pain medications. Pancreatitis most likely from previous history of alcoholism. Pseudocyst increased from just under 6 cm to just under 8 cm in a period of one month. Have consulted gastroenterology. Recommendations appreciated. 2. Chronic systolic heart failure last EF measured was 35-40% in May 2017 possibly from alcoholism or rate related - patient does not look fluid overloaded at this time. Continue lisinopril as tolerated. 3. History of atrial fibrillation - rate controlled.  Chads 2 vasc score is 1. Continue amiodarone metoprolol. Held off on aspirin secondary to possibility for intervention. 4. History of alcoholism and has not had any alcohol for last 6 weeks. Continue thiamine.  DVT prophylaxis: SCDs Code Status: Full Family Communication: Patient in room, family not at bedside Disposition Plan: Uncertain at this time   Consultants:   Gastroenterology  Procedures:    Antimicrobials: (specify start and planned stop date. Auto populated tables are space occupying and do not give end dates) Anti-infectives    None           Subjective: Still complaining of mild epigastric pain this morning, however somewhat improved since admission  Objective: Filed Vitals:   03/05/16 0130 03/05/16 0418 03/05/16 0517 03/05/16 1353  BP: 134/88 115/97 122/77 96/55  Pulse: 71 89 62 65  Temp:  98.1 F (36.7 C) 98.2 F (36.8 C) 97.9 F (36.6 C)  TempSrc:  Oral Oral Oral  Resp: Height:   6' (1.829 m)   Weight:   139 kg (306 lb 7 oz)   SpO2: 95% 95% 98% 100%    Intake/Output Summary (Last 24 hours) at 03/05/16 1420 Last data filed at 03/05/16 0600  Gross per 24 hour  Intake      0 ml  Output      0 ml  Net      0 ml   Filed Weights   03/05/16 0025 03/05/16 0517  Weight: 138.982 kg (306 lb 6.4 oz) 139 kg (306 lb 7 oz)    Examination:  General exam: Appears calm and comfortable  Respiratory system: Clear  to auscultation. Respiratory effort normal. Cardiovascular system: S1 & S2 heard, RRR. Gastrointestinal system: Abdomen is nondistended, soft. Epigastric tenderness on moderate palpation No organomegaly or masses felt. Normal bowel sounds heard. Central nervous system: Alert and oriented. No focal neurological deficits. Extremities: Symmetric 5 x 5 power. Skin: No rashes, lesions or ulcers Psychiatry: Judgement and insight appear normal. Mood & affect appropriate.     Data Reviewed: I have personally reviewed following labs and  imaging studies  CBC:  Recent Labs Lab 03/05/16 0043 03/05/16 0639  WBC 12.2* 7.7  NEUTROABS  --  5.2  HGB 13.6 12.0*  HCT 41.1 36.6*  MCV 88.6 89.1  PLT 229 247   Basic Metabolic Panel:  Recent Labs Lab 03/04/16 1505 03/05/16 0043 03/05/16 0639  NA 141 140 138  K 4.2 3.7 3.8  CL 109 109 108  CO2 22 23 25   GLUCOSE 97 95 106*  BUN 10 11 10   CREATININE 0.67 0.70 0.64  CALCIUM 8.5* 8.5* 8.1*   GFR: Estimated Creatinine Clearance: 204 mL/min (by C-G formula based on Cr of 0.64). Liver Function Tests:  Recent Labs Lab 03/04/16 1505 03/05/16 0043 03/05/16 0639  AST 14 16 15   ALT 19 20 19   ALKPHOS 40 43 38  BILITOT 0.5 0.6 0.8  PROT 6.5 7.0 6.2*  ALBUMIN 4.1 4.1 3.5    Recent Labs Lab 03/04/16 1505 03/05/16 0043 03/05/16 0639  LIPASE 548* 4210* 379*  AMYLASE 184*  --   --    No results for input(s): AMMONIA in the last 168 hours. Coagulation Profile: No results for input(s): INR, PROTIME in the last 168 hours. Cardiac Enzymes:  Recent Labs Lab 03/05/16 0639  TROPONINI <0.03   BNP (last 3 results) No results for input(s): PROBNP in the last 8760 hours. HbA1C: No results for input(s): HGBA1C in the last 72 hours. CBG: No results for input(s): GLUCAP in the last 168 hours. Lipid Profile:  Recent Labs  03/05/16 0639  CHOL 110  HDL 30*  LDLCALC 63  TRIG 85  CHOLHDL 3.7   Thyroid Function Tests:  Recent Labs  03/04/16 1505  TSH 2.70   Anemia Panel: No results for input(s): VITAMINB12, FOLATE, FERRITIN, TIBC, IRON, RETICCTPCT in the last 72 hours. Sepsis Labs: No results for input(s): PROCALCITON, LATICACIDVEN in the last 168 hours.  No results found for this or any previous visit (from the past 240 hour(s)).       Radiology Studies: Ct Abdomen Pelvis W Contrast  03/05/2016  CLINICAL DATA:  Acute onset of right upper quadrant abdominal pain. Leukocytosis and elevated lipase. Initial encounter. EXAM: CT ABDOMEN AND PELVIS WITH  CONTRAST TECHNIQUE: Multidetector CT imaging of the abdomen and pelvis was performed using the standard protocol following bolus administration of intravenous contrast. CONTRAST:  ISOVUE-300 IOPAMIDOL (ISOVUE-300) INJECTION 61% COMPARISON:  CT of the abdomen and pelvis performed 01/17/2016, and abdominal ultrasound performed 01/23/2016 FINDINGS: Mild bibasilar atelectasis is noted. Mild soft tissue inflammation is noted about the pancreas, with inflammation extending about the antrum of the stomach. There appears to be a 7.9 x 3.4 x 4.2 cm pseudocyst adjacent to the antrum of the stomach, with surrounding soft inflammation. Findings are compatible with acute pancreatitis. There is no evidence of devascularization. The liver is unremarkable in appearance. The spleen is mildly enlarged, measuring 13.0 cm in length. The gallbladder is within normal limits. The adrenal glands are unremarkable. The kidneys are unremarkable in appearance. There is no evidence of hydronephrosis. No renal or  ureteral stones are seen. No perinephric stranding is appreciated. No free fluid is identified. The small bowel is unremarkable in appearance. The stomach is within normal limits. No acute vascular abnormalities are seen. The appendix is normal in caliber, without evidence of appendicitis. The colon is grossly unremarkable in appearance. The bladder is mildly distended and grossly unremarkable. The prostate remains normal in size. No inguinal lymphadenopathy is seen. No acute osseous abnormalities are identified. IMPRESSION: 1. 7.9 x 3.4 x 4.2 cm pseudocyst adjacent to the antrum of the stomach, with surrounding soft inflammation. Mild soft tissue inflammation about the pancreas. Findings compatible with acute pancreatitis. No evidence of devascularization at this time. 2. Mild splenomegaly. 3. Mild bibasilar atelectasis noted. Electronically Signed   By: Roanna Raider M.D.   On: 03/05/2016 04:07        Scheduled Meds: .  amiodarone  200 mg Oral Daily  . lisinopril  5 mg Oral Daily  . metoprolol succinate  50 mg Oral Daily  . omega-3 acid ethyl esters  1 g Oral Daily  . [START ON 03/06/2016] thiamine  100 mg Oral Daily   Continuous Infusions: . sodium chloride 125 mL/hr at 03/05/16 1156     LOS: 0 days    Aracelie Addis, Scheryl Marten, MD [facility] Pager [phone]  If 7PM-7AM, please contact night-coverage [email or web address] Password [email or web address] 03/05/2016, 2:20 PM

## 2016-03-05 NOTE — ED Provider Notes (Signed)
CSN: [id number]     Arrival date & time 03/05/16  0016 History  By signing my name below, I, Vista Mink, attest that this documentation has been prepared under the direction and in the presence of Gilda Crease, MD. Electronically signed, Vista Mink, ED Scribe. 03/05/2016. 12:36 AM.   Chief Complaint  Patient presents with  . Abdominal Pain   The history is provided by the patient. No language interpreter was used.   HPI Comments: Malvin Selz is a 25 y.o. male who presents to the Emergency Department complaining of RUQ abdominal pain since yesterday afternoon. Pt describes the pain as a 7/10 currently. Pt states pain feels similar to his pancreatitis in 2012. Pt reports decreased thirst. Pt further complains of mild constipation. Pt reports his pain was exacerbated after taking a few sips of water. Pt denies vomiting or diarrhea  Past Medical History  Diagnosis Date  . Pancreatitis 2012    recurrent acute. Admissions to [facility], 12/2015 admissin to [facility] hospital, ct then shows pseudocyst.   . Obesity     BMI 45 12/2015  . Fatty liver 12/2015    per CT  . Atrial flutter (HCC) 01/17/2016    Admx 5/17 with AFlutter with RVR in setting of pancreatitis // CHADS2-VASc=1 (CHF) // Anticoag not started // Amiodarone >> NSR   . Chronic systolic CHF (congestive heart failure) (HCC)     Echo 01/17/16 - EF 35-40%, diffuse HK, mild LAE   . 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HCC) 02/03/2016    EF 35-40% in setting of AFlutter with RVR in 5/17 // probably tachycardia mediated // needs FU echo 2-3 mos after NSR restored    Past Surgical History  Procedure Laterality Date  . Hand surgery     Family History  Problem Relation Age of Onset  . Diabetes Mother    Social History  Substance Use Topics  . Smoking status: Former Games developer  . Smokeless tobacco: Current User  . Alcohol Use: Yes    Review of Systems  Constitutional: Negative for fever.  Gastrointestinal: Positive for abdominal pain (RUQ) and  constipation. Negative for vomiting and diarrhea.  All other systems reviewed and are negative.     Allergies  Review of patient's allergies indicates no known allergies.  Home Medications   Prior to Admission medications   Medication Sig Start Date End Date Taking? Authorizing Provider  amiodarone (PACERONE) 200 MG tablet Take 1 tablet (200 mg total) by mouth daily. 03/01/16  Yes Beatrice Lecher, PA-C  aspirin 81 MG chewable tablet Chew 1 tablet (81 mg total) by mouth daily. 01/27/16  Yes Marinda Elk, MD  ibuprofen (ADVIL,MOTRIN) 200 MG tablet Take 400 mg by mouth every 6 (six) hours as needed for mild pain.   Yes Historical Provider, MD  lisinopril (PRINIVIL,ZESTRIL) 5 MG tablet Take 1 tablet (5 mg total) by mouth daily. 03/01/16  Yes Beatrice Lecher, PA-C  metoprolol succinate (TOPROL-XL) 50 MG 24 hr tablet Take 1 tablet (50 mg total) by mouth daily. Take with or immediately following a meal. 02/03/16  Yes Beatrice Lecher, PA-C  Omega-3 Fatty Acids (FISH OIL PO) Take 1 capsule by mouth daily.   Yes Historical Provider, MD  ondansetron (ZOFRAN ODT) 8 MG disintegrating tablet Take 1 tablet (8 mg total) by mouth every 8 (eight) hours as needed for nausea or vomiting. 01/27/16  Yes Marinda Elk, MD  traMADol (ULTRAM) 50 MG tablet Take 50 mg by mouth every 6 (six) hours as  needed for moderate pain.   Yes Historical Provider, MD   BP 134/88 mmHg  Pulse 71  Temp(Src) 98.4 F (36.9 C) (Oral)  Resp 16  Ht 6' (1.829 m)  Wt 306 lb 6.4 oz (138.982 kg)  BMI 41.55 kg/m2  SpO2 95% Physical Exam  Constitutional: He is oriented to person, place, and time. He appears well-developed and well-nourished. No distress.  HENT:  Head: Normocephalic and atraumatic.  Right Ear: Hearing normal.  Left Ear: Hearing normal.  Nose: Nose normal.  Mouth/Throat: Oropharynx is clear and moist and mucous membranes are normal.  Eyes: Conjunctivae and EOM are normal. Pupils are equal, round, and reactive to  light.  Neck: Normal range of motion. Neck supple.  Cardiovascular: Regular rhythm, S1 normal and S2 normal.  Exam reveals no gallop and no friction rub.   No murmur heard. Pulmonary/Chest: Effort normal and breath sounds normal. No respiratory distress. He exhibits no tenderness.  Abdominal: Soft. Normal appearance and bowel sounds are normal. There is no hepatosplenomegaly. There is tenderness. There is no rebound, no guarding, no tenderness at McBurney's point and negative Murphy's sign. No hernia.  RUQ, LUQ and epigastric tenderness  Musculoskeletal: Normal range of motion.  Neurological: He is alert and oriented to person, place, and time. He has normal strength. No cranial nerve deficit or sensory deficit. Coordination normal. GCS eye subscore is 4. GCS verbal subscore is 5. GCS motor subscore is 6.  Skin: Skin is warm, dry and intact. No rash noted. No cyanosis.  Psychiatric: He has a normal mood and affect. His speech is normal and behavior is normal. Thought content normal.  Nursing note and vitals reviewed.   ED Course  Procedures  DIAGNOSTIC STUDIES: Oxygen Saturation is 100% on RA, normal by my interpretation.  COORDINATION OF CARE: 12:33 AM-Will order pain medication and blood work. Discussed treatment plan with pt at bedside and pt agreed to plan.   Labs Review Labs Reviewed  LIPASE, BLOOD - Abnormal; Notable for the following:    Lipase 4210 (*)    All other components within normal limits  COMPREHENSIVE METABOLIC PANEL - Abnormal; Notable for the following:    Calcium 8.5 (*)    All other components within normal limits  CBC - Abnormal; Notable for the following:    WBC 12.2 (*)    All other components within normal limits  URINALYSIS, ROUTINE W REFLEX MICROSCOPIC (NOT AT [facility])    [facility] No results found. I have personally reviewed and evaluated these images and lab results as part of my medical decision-making.   EKG Interpretation None      MDM    Final diagnoses:  Acute pancreatitis without infection or necrosis, unspecified pancreatitis type    Patient presents to the emergency department for evaluation of abdominal pain. Patient reports that pain has been present since yesterday. He has a history of pancreatitis and this pain feels similar. Examination did reveal diffuse upper abdominal tenderness, but no concerning signs for peritonitis. Patient's vital signs are normal, no hypotension. He does not have a fever. Lab work reveals lipase of 4200, otherwise no significant abnormalities. Patient uncomfortable, requiring multiple doses of analgesia. He will require hospitalization for further management. His gastroenterologist is Dr. Christella Hartigan.  I personally performed the services described in this documentation, which was scribed in my presence. The recorded information has been reviewed and is accurate.     Gilda Crease, MD 03/05/16 [phone]

## 2016-03-05 NOTE — Telephone Encounter (Signed)
DPR for fiance who has been notified of rest of lab results are fine. I stated we know that pt is admitted to hospital at this time for reoccurring pancreatitis. Fiance thanked [place] for the call.

## 2016-03-05 NOTE — Progress Notes (Signed)
Pt c/o increased pain in abd, Morphine given, MD notified.

## 2016-03-06 DIAGNOSIS — K858 Other acute pancreatitis without necrosis or infection: Secondary | ICD-10-CM

## 2016-03-06 LAB — CBC
HCT: 38.7 % — ABNORMAL LOW (ref 39.0–52.0)
Hemoglobin: 12.7 g/dL — ABNORMAL LOW (ref 13.0–17.0)
MCH: 29.3 pg (ref 26.0–34.0)
MCHC: 32.8 g/dL (ref 30.0–36.0)
MCV: 89.4 fL (ref 78.0–100.0)
Platelets: 267 10*3/uL (ref 150–400)
RBC: 4.33 MIL/uL (ref 4.22–5.81)
RDW: 13.6 % (ref 11.5–15.5)
WBC: 6.5 10*3/uL (ref 4.0–10.5)

## 2016-03-06 LAB — BASIC METABOLIC PANEL
Anion gap: 4 — ABNORMAL LOW (ref 5–15)
BUN: 8 mg/dL (ref 6–20)
CO2: 27 mmol/L (ref 22–32)
Calcium: 8.2 mg/dL — ABNORMAL LOW (ref 8.9–10.3)
Chloride: 109 mmol/L (ref 101–111)
Creatinine, Ser: 0.71 mg/dL (ref 0.61–1.24)
GFR calc Af Amer: >60 mL/min (ref >60)
GFR calc non Af Amer: >60 mL/min (ref >60)
Glucose, Bld: 86 mg/dL (ref 65–99)
Potassium: 3.5 mmol/L (ref 3.5–5.1)
Sodium: 140 mmol/L (ref 135–145)

## 2016-03-06 LAB — LIPASE, BLOOD: Lipase: 131 U/L — ABNORMAL HIGH (ref 11–51)

## 2016-03-06 NOTE — Progress Notes (Signed)
PROGRESS NOTE    Nathaniel [name]  WUJ:[id number] DOB: 1991-03-16 DOA: 03/05/2016 PCP: No PCP Per Patient    Brief Narrative:  Nathaniel [name] is a 25 y.o. male with pancreatitis and was admitted 6 weeks prior to admission prior to admission for pancreatitis secondary to alcoholism and at that time CT showed pseudocyst formation and patient also had A. fib and 2-D echo showed CHF presents to the ER because of abdominal pain. Patient has been having epigastric pain radiating to the back over the last 2 days. Denies any associated nausea vomiting. Denies any chest pain or shortness of breath fever chills. Patient states he has not had any alcohol for last 6 weeks since last admission. He has been taking his medications for cardiomyopathy and A. fib medications including amiodarone lisinopril and metoprolol. CT of the abdomen and pelvis done in the ER shows pseudocyst which is enlarging with features consistent with acute pancreatitis. Lipase is more than 4000. Patient is being admitted for further management.    Assessment & Plan:   Principal Problem:   Acute pancreatitis without infection or necrosis Active Problems:   Chronic systolic CHF (congestive heart failure) (HCC)   Acute pancreatitis  1. Acute on chronic pancreatitis with pseudocyst formation - patient states he has not had any alcohol for last 6 weeks. Pseudocyst is enlarging from prior study ago. No signs of any necrosis or infection. Pancreatitis most likely from previous history of alcoholism.  Gastroneurology following. Appreciate input. Currently advancing diet as tolerated. 2. Chronic systolic heart failure last EF measured was 35-40% in May 2017 possibly from alcoholism or rate related - patient does not look fluid overloaded at this time. Continue lisinopril as tolerated. 3. History of atrial fibrillation - rate controlled. Chads 2 vasc score is 1. Continue amiodarone metoprolol. Held off on aspirin secondary to possibility  for intervention. 4. History of alcoholism and has not had any alcohol for last 6 weeks. Continue thiamine.  DVT prophylaxis: SCDs Code Status: Full Family Communication: Patient in room, family not at bedside Disposition Plan: Uncertain at this time   Consultants:   Gastroenterology  Procedures:    Antimicrobials:  Anti-infectives    None          Subjective: Patient reports overall feeling somewhat better. Questioning about going home  Objective: Filed Vitals:   03/05/16 1353 03/05/16 2106 03/06/16 0518 03/06/16 1326  BP: 96/55 109/66 118/77 106/54  Pulse: 65 57 93 49  Temp: 97.9 F (36.6 C) 97.7 F (36.5 C) 98.2 F (36.8 C) 97.8 F (36.6 C)  TempSrc: Oral Oral Oral Oral  Resp: Height:      Weight:      SpO2: 100% 99% 97% 97%    Intake/Output Summary (Last 24 hours) at 03/06/16 1527 Last data filed at 03/06/16 1000  Gross per 24 hour  Intake 1366.25 ml  Output    500 ml  Net 866.25 ml   Filed Weights   03/05/16 0025 03/05/16 0517  Weight: 138.982 kg (306 lb 6.4 oz) 139 kg (306 lb 7 oz)    Examination:  General exam: Appears calm and comfortable, Lying in bed  Respiratory system: Clear to auscultation. Respiratory effort normal. Cardiovascular system: S1 & S2 heard, RRR. Gastrointestinal system: Abdomen is nondistended, soft. Epigastric tenderness on moderate palpation No organomegaly or masses felt. Normal bowel sounds heard. Central nervous system: Alert and oriented. No focal neurological deficits. Extremities: Symmetric 5 x 5 power. Skin: No rashes, lesions Psychiatry:  Judgement and insight appear normal. Mood & affect appropriate.     Data Reviewed: I have personally reviewed following labs and imaging studies  CBC:  Recent Labs Lab 03/05/16 0043 03/05/16 0639 03/06/16 0410  WBC 12.2* 7.7 6.5  NEUTROABS  --  5.2  --   HGB 13.6 12.0* 12.7*  HCT 41.1 36.6* 38.7*  MCV 88.6 89.1 89.4  PLT 229 247 267   Basic Metabolic  Panel:  Recent Labs Lab 03/04/16 1505 03/05/16 0043 03/05/16 0639 03/06/16 0410  NA 141 140 138 140  K 4.2 3.7 3.8 3.5  CL 109 109 108 109  CO2 22 23 25 27   GLUCOSE 97 95 106* 86  BUN 10 11 10 8   CREATININE 0.67 0.70 0.64 0.71  CALCIUM 8.5* 8.5* 8.1* 8.2*   GFR: Estimated Creatinine Clearance: 204 mL/min (by C-G formula based on Cr of 0.71). Liver Function Tests:  Recent Labs Lab 03/04/16 1505 03/05/16 0043 03/05/16 0639  AST 14 16 15   ALT 19 20 19   ALKPHOS 40 43 38  BILITOT 0.5 0.6 0.8  PROT 6.5 7.0 6.2*  ALBUMIN 4.1 4.1 3.5    Recent Labs Lab 03/04/16 1505 03/05/16 0043 03/05/16 0639 03/06/16 0410  LIPASE 548* 4210* 379* 131*  AMYLASE 184*  --   --   --    No results for input(s): AMMONIA in the last 168 hours. Coagulation Profile: No results for input(s): INR, PROTIME in the last 168 hours. Cardiac Enzymes:  Recent Labs Lab 03/05/16 0639  TROPONINI <0.03   BNP (last 3 results) No results for input(s): PROBNP in the last 8760 hours. HbA1C: No results for input(s): HGBA1C in the last 72 hours. CBG: No results for input(s): GLUCAP in the last 168 hours. Lipid Profile:  Recent Labs  03/05/16 0639  CHOL 110  HDL 30*  LDLCALC 63  TRIG 85  CHOLHDL 3.7   Thyroid Function Tests:  Recent Labs  03/04/16 1505  TSH 2.70   Anemia Panel: No results for input(s): VITAMINB12, FOLATE, FERRITIN, TIBC, IRON, RETICCTPCT in the last 72 hours. Sepsis Labs: No results for input(s): PROCALCITON, LATICACIDVEN in the last 168 hours.  No results found for this or any previous visit (from the past 240 hour(s)).       Radiology Studies: Ct Abdomen Pelvis W Contrast  03/05/2016  CLINICAL DATA:  Acute onset of right upper quadrant abdominal pain. Leukocytosis and elevated lipase. Initial encounter. EXAM: CT ABDOMEN AND PELVIS WITH CONTRAST TECHNIQUE: Multidetector CT imaging of the abdomen and pelvis was performed using the standard protocol following bolus  administration of intravenous contrast. CONTRAST:  ISOVUE-300 IOPAMIDOL (ISOVUE-300) INJECTION 61% COMPARISON:  CT of the abdomen and pelvis performed 01/17/2016, and abdominal ultrasound performed 01/23/2016 FINDINGS: Mild bibasilar atelectasis is noted. Mild soft tissue inflammation is noted about the pancreas, with inflammation extending about the antrum of the stomach. There appears to be a 7.9 x 3.4 x 4.2 cm pseudocyst adjacent to the antrum of the stomach, with surrounding soft inflammation. Findings are compatible with acute pancreatitis. There is no evidence of devascularization. The liver is unremarkable in appearance. The spleen is mildly enlarged, measuring 13.0 cm in length. The gallbladder is within normal limits. The adrenal glands are unremarkable. The kidneys are unremarkable in appearance. There is no evidence of hydronephrosis. No renal or ureteral stones are seen. No perinephric stranding is appreciated. No free fluid is identified. The small bowel is unremarkable in appearance. The stomach is within normal limits. No acute  vascular abnormalities are seen. The appendix is normal in caliber, without evidence of appendicitis. The colon is grossly unremarkable in appearance. The bladder is mildly distended and grossly unremarkable. The prostate remains normal in size. No inguinal lymphadenopathy is seen. No acute osseous abnormalities are identified. IMPRESSION: 1. 7.9 x 3.4 x 4.2 cm pseudocyst adjacent to the antrum of the stomach, with surrounding soft inflammation. Mild soft tissue inflammation about the pancreas. Findings compatible with acute pancreatitis. No evidence of devascularization at this time. 2. Mild splenomegaly. 3. Mild bibasilar atelectasis noted. Electronically Signed   By: Roanna Raider M.D.   On: 03/05/2016 04:07        Scheduled Meds: . amiodarone  200 mg Oral Daily  . lisinopril  5 mg Oral Daily  . metoprolol succinate  50 mg Oral Daily  . omega-3 acid ethyl  esters  1 g Oral Daily  . thiamine  100 mg Oral Daily   Continuous Infusions:     LOS: 1 day    Keegan Ducey, Scheryl Marten, MD [facility] Pager [phone]  If 7PM-7AM, please contact night-coverage [email or web address] Password [email or web address] 03/06/2016, 3:27 PM

## 2016-03-06 NOTE — Progress Notes (Signed)
Progress Note   Subjective  Up on side of  Bed- says feels  better , no nausea , and less pain. We discussed alcohol again and he is not an alcoholic   Lipase 131   Objective   Vital signs in last 24 hours: Temp:  [97.7 F (36.5 C)-98.2 F (36.8 C)] 98.2 F (36.8 C) (07/08 0518) Pulse Rate:  [57-93] 93 (07/08 0518) Resp:  [18] 18 (07/08 0518) BP: (96-118)/(55-77) 118/77 mmHg (07/08 0518) SpO2:  [97 %-100 %] 97 % (07/08 0518) Last BM Date: 03/04/16 General:    white male in NAD Heart:  Regular rate and rhythm; no murmurs Lungs: Respirations even and unlabored, lungs CTA bilaterally Abdomen:  Soft, minimally tender and nondistended. Normal bowel sounds. Extremities:  Without edema. Neurologic:  Alert and oriented,  grossly normal neurologically. Psych:  Cooperative. Normal mood and affect.  Intake/Output from previous day: 07/07 0701 - 07/08 0700 In: 1550.4 [I.V.:1550.4] Out: 500 [Urine:500] Intake/Output this shift:    Lab Results:  Recent Labs  03/05/16 0043 03/05/16 0639 03/06/16 0410  WBC 12.2* 7.7 6.5  HGB 13.6 12.0* 12.7*  HCT 41.1 36.6* 38.7*  PLT 229 247 267   BMET  Recent Labs  03/05/16 0043 03/05/16 0639 03/06/16 0410  NA 140 138 140  K 3.7 3.8 3.5  CL 109 108 109  CO2 23 25 27   GLUCOSE 95 106* 86  BUN 11 10 8   CREATININE 0.70 0.64 0.71  CALCIUM 8.5* 8.1* 8.2*   LFT  Recent Labs  03/05/16 0639  PROT 6.2*  ALBUMIN 3.5  AST 15  ALT 19  ALKPHOS 38  BILITOT 0.8  BILIDIR 0.2  IBILI 0.6   PT/INR No results for input(s): LABPROT, INR in the last 72 hours.  Studies/Results: Ct Abdomen Pelvis W Contrast  03/05/2016  CLINICAL DATA:  Acute onset of right upper quadrant abdominal pain. Leukocytosis and elevated lipase. Initial encounter. EXAM: CT ABDOMEN AND PELVIS WITH CONTRAST TECHNIQUE: Multidetector CT imaging of the abdomen and pelvis was performed using the standard protocol following bolus administration of intravenous contrast.  CONTRAST:  ISOVUE-300 IOPAMIDOL (ISOVUE-300) INJECTION 61% COMPARISON:  CT of the abdomen and pelvis performed 01/17/2016, and abdominal ultrasound performed 01/23/2016 FINDINGS: Mild bibasilar atelectasis is noted. Mild soft tissue inflammation is noted about the pancreas, with inflammation extending about the antrum of the stomach. There appears to be a 7.9 x 3.4 x 4.2 cm pseudocyst adjacent to the antrum of the stomach, with surrounding soft inflammation. Findings are compatible with acute pancreatitis. There is no evidence of devascularization. The liver is unremarkable in appearance. The spleen is mildly enlarged, measuring 13.0 cm in length. The gallbladder is within normal limits. The adrenal glands are unremarkable. The kidneys are unremarkable in appearance. There is no evidence of hydronephrosis. No renal or ureteral stones are seen. No perinephric stranding is appreciated. No free fluid is identified. The small bowel is unremarkable in appearance. The stomach is within normal limits. No acute vascular abnormalities are seen. The appendix is normal in caliber, without evidence of appendicitis. The colon is grossly unremarkable in appearance. The bladder is mildly distended and grossly unremarkable. The prostate remains normal in size. No inguinal lymphadenopathy is seen. No acute osseous abnormalities are identified. IMPRESSION: 1. 7.9 x 3.4 x 4.2 cm pseudocyst adjacent to the antrum of the stomach, with surrounding soft inflammation. Mild soft tissue inflammation about the pancreas. Findings compatible with acute pancreatitis. No evidence of devascularization at this time. 2.  Mild splenomegaly. 3. Mild bibasilar atelectasis noted. Electronically Signed   By: Roanna Raider M.D.   On: 03/05/2016 04:07       Assessment / Plan:    #1 25 yo male with acute recurrent mild pancreatitis and enlarging pseudocyst (from last episode in 12/2015) This episode is mild  Pseudocyst is not mature and  drainage not indicated at this time  Will advance to low fat full liquids ,  Please note we don't want him labeled as alcoholic pancreatitis as  He is not an alcoholic and we are not certain that alcohol is etiology of his  Recurrent pancreatitis  - he may have autoimmune pancreatitis or pancreas divisum  He will need further outpt workup  With MRCP, and possible EUS with Dr Christella Hartigan  Once this episode cools down  He knows to stay on a low fat diet and avoid ETOH Has appt with Dr Christella Hartigan on 9/12 at 1:30 Principal Problem:   Acute pancreatitis without infection or necrosis Active Problems:   Chronic systolic CHF (congestive heart failure) (HCC)   Acute pancreatitis     LOS: 1 day   Amy Esterwood  03/06/2016, 9:32 AM  GI ATTENDING  Interval history and data reviewed. Agree with interval progress note. Clinically stable. Advance diet. If tolerates, ok to go home with recommendations and follow up as arranged above. Will sign off, but we are available for new problems or questions. Thanks .  Wilhemina Bonito. Eda Keys., M.D. [facility] Division of Gastroenterology

## 2016-03-07 LAB — LIPASE, BLOOD: Lipase: 70 U/L — ABNORMAL HIGH (ref 11–51)

## 2016-03-07 MED ORDER — TRAMADOL HCL 50 MG PO TABS: 50.0000 mg | ORAL_TABLET | Freq: Four times a day (QID) | ORAL | Status: DC | PRN

## 2016-03-07 NOTE — Discharge Summary (Signed)
Physician Discharge Summary  Nathaniel [name] PQA:[id number] DOB: October 18, 1990 DOA: 03/05/2016  PCP: No PCP Per Patient  Admit date: 03/05/2016 Discharge date: 03/07/2016  Disposition:  Home  Recommendations for Outpatient Follow-up:  1. Follow up with PCP in 1-2 weeks 2. Follow up with GI as scheduled  Discharge Condition:Improved CODE STATUS:Full Diet recommendation: Low fat  Brief/Interim Summary: 25 y.o. male with pancreatitis and was admitted 6 weeks prior to admission prior to admission for pancreatitis secondary to alcoholism and at that time CT showed pseudocyst formation and patient also had A. fib and 2-D echo showed CHF presents to the ER because of abdominal pain. Patient has been having epigastric pain radiating to the back over the last 2 days. Denies any associated nausea vomiting. Denies any chest pain or shortness of breath fever chills. Patient states he has not had any alcohol for last 6 weeks since last admission. He has been taking his medications for cardiomyopathy and A. fib medications including amiodarone lisinopril and metoprolol. CT of the abdomen and pelvis done in the ER shows pseudocyst which is enlarging with features consistent with acute pancreatitis. Lipase is more than 4000. Patient is being admitted for further management.  1. Acute on chronic pancreatitis with pseudocyst formation - patient states he has not had any alcohol for last 6 weeks prior to admission. Pseudocyst is enlarging from prior study ago. No signs of any necrosis or infection. Pancreatitis most likely from previous history of alcoholism. Gastroneurology following. Appreciate input. Successfully advanced diet. Patient to follow up with GI as outpatient 2. Chronic systolic heart failure last EF measured was 35-40% in May 2017 possibly from alcoholism or rate related - patient does not look fluid overloaded at this time. Continued lisinopril as tolerated. 3. History of atrial fibrillation - rate  controlled. Chads 2 vasc score is 1. Continue amiodarone metoprolol. Held off on aspirin this admission secondary to possibility for intervention. 4. History of alcoholism and has not had any alcohol for last 6 weeks. Continued thiamine.  Discharge Diagnoses:  Principal Problem:   Acute pancreatitis without infection or necrosis Active Problems:   Chronic systolic CHF (congestive heart failure) (HCC)   Acute pancreatitis       Medication List    TAKE these medications        amiodarone 200 MG tablet  Commonly known as:  PACERONE  Take 1 tablet (200 mg total) by mouth daily.     aspirin 81 MG chewable tablet  Chew 1 tablet (81 mg total) by mouth daily.     FISH OIL PO  Take 1 capsule by mouth daily.     ibuprofen 200 MG tablet  Commonly known as:  ADVIL,MOTRIN  Take 400 mg by mouth every 6 (six) hours as needed for mild pain.     lisinopril 5 MG tablet  Commonly known as:  PRINIVIL,ZESTRIL  Take 1 tablet (5 mg total) by mouth daily.     metoprolol succinate 50 MG 24 hr tablet  Commonly known as:  TOPROL-XL  Take 1 tablet (50 mg total) by mouth daily. Take with or immediately following a meal.     ondansetron 8 MG disintegrating tablet  Commonly known as:  ZOFRAN ODT  Take 1 tablet (8 mg total) by mouth every 8 (eight) hours as needed for nausea or vomiting.     traMADol 50 MG tablet  Commonly known as:  ULTRAM  Take 50 mg by mouth every 6 (six) hours as needed for moderate pain.  Follow-up Information    Follow up with Rachael Fee, MD On 05/11/2016.   Specialty:  Gastroenterology   Why:   at 1;30 pm  arrive early !   Contact information:   [address] [place] [address] [phone]       Follow up with follow up with your PCP In 1 week.   Why:  Hospital follow up     No Known Allergies  Consultations:  GI   Procedures/Studies: Ct Abdomen Pelvis W Contrast  03/05/2016  CLINICAL DATA:  Acute onset of right upper quadrant abdominal  pain. Leukocytosis and elevated lipase. Initial encounter. EXAM: CT ABDOMEN AND PELVIS WITH CONTRAST TECHNIQUE: Multidetector CT imaging of the abdomen and pelvis was performed using the standard protocol following bolus administration of intravenous contrast. CONTRAST:  ISOVUE-300 IOPAMIDOL (ISOVUE-300) INJECTION 61% COMPARISON:  CT of the abdomen and pelvis performed 01/17/2016, and abdominal ultrasound performed 01/23/2016 FINDINGS: Mild bibasilar atelectasis is noted. Mild soft tissue inflammation is noted about the pancreas, with inflammation extending about the antrum of the stomach. There appears to be a 7.9 x 3.4 x 4.2 cm pseudocyst adjacent to the antrum of the stomach, with surrounding soft inflammation. Findings are compatible with acute pancreatitis. There is no evidence of devascularization. The liver is unremarkable in appearance. The spleen is mildly enlarged, measuring 13.0 cm in length. The gallbladder is within normal limits. The adrenal glands are unremarkable. The kidneys are unremarkable in appearance. There is no evidence of hydronephrosis. No renal or ureteral stones are seen. No perinephric stranding is appreciated. No free fluid is identified. The small bowel is unremarkable in appearance. The stomach is within normal limits. No acute vascular abnormalities are seen. The appendix is normal in caliber, without evidence of appendicitis. The colon is grossly unremarkable in appearance. The bladder is mildly distended and grossly unremarkable. The prostate remains normal in size. No inguinal lymphadenopathy is seen. No acute osseous abnormalities are identified. IMPRESSION: 1. 7.9 x 3.4 x 4.2 cm pseudocyst adjacent to the antrum of the stomach, with surrounding soft inflammation. Mild soft tissue inflammation about the pancreas. Findings compatible with acute pancreatitis. No evidence of devascularization at this time. 2. Mild splenomegaly. 3. Mild bibasilar atelectasis noted.  Electronically Signed   By: Roanna Raider M.D.   On: 03/05/2016 04:07     Subjective: Pain resolved. Feels better  Discharge Exam: Filed Vitals:   03/06/16 2001 03/07/16 0641  BP: 118/59 101/45  Pulse: 58 58  Temp: 97.7 F (36.5 C) 97.8 F (36.6 C)  Resp: 20 20   Filed Vitals:   03/06/16 0518 03/06/16 1326 03/06/16 2001 03/07/16 0641  BP: 118/77 106/54 118/59 101/45  Pulse: 93 49 58 58  Temp: 98.2 F (36.8 C) 97.8 F (36.6 C) 97.7 F (36.5 C) 97.8 F (36.6 C)  TempSrc: Oral Oral Oral Oral  Resp: Height:      Weight:   135.4 kg (298 lb 8.1 oz) 136.487 kg (300 lb 14.4 oz)  SpO2: 97% 97% 98% 98%    General: Pt is alert, awake, not in acute distress, sitting in chair Cardiovascular: RRR, S1/S2 + Respiratory: CTA bilaterally, no wheezing, no rhonchi Abdominal: Soft, NT, ND, bowel sounds + Extremities: no edema, no cyanosis    The results of significant diagnostics from this hospitalization (including imaging, microbiology, ancillary and laboratory) are listed below for reference.     Microbiology: No results found for this or any previous visit (from the  past 240 hour(s)).   Labs: BNP (last 3 results)  Recent Labs  01/19/16 1306  BNP 146.9*   Basic Metabolic Panel:  Recent Labs Lab 03/04/16 1505 03/05/16 0043 03/05/16 0639 03/06/16 0410  NA 141 140 138 140  K 4.2 3.7 3.8 3.5  CL 109 109 108 109  CO2 22 23 25 27   GLUCOSE 97 95 106* 86  BUN 10 11 10 8   CREATININE 0.67 0.70 0.64 0.71  CALCIUM 8.5* 8.5* 8.1* 8.2*   Liver Function Tests:  Recent Labs Lab 03/04/16 1505 03/05/16 0043 03/05/16 0639  AST 14 16 15   ALT 19 20 19   ALKPHOS 40 43 38  BILITOT 0.5 0.6 0.8  PROT 6.5 7.0 6.2*  ALBUMIN 4.1 4.1 3.5    Recent Labs Lab 03/04/16 1505 03/05/16 0043 03/05/16 0639 03/06/16 0410 03/07/16 0441  LIPASE 548* 4210* 379* 131* 70*  AMYLASE 184*  --   --   --   --    No results for input(s): AMMONIA in the last 168  hours. CBC:  Recent Labs Lab 03/05/16 0043 03/05/16 0639 03/06/16 0410  WBC 12.2* 7.7 6.5  NEUTROABS  --  5.2  --   HGB 13.6 12.0* 12.7*  HCT 41.1 36.6* 38.7*  MCV 88.6 89.1 89.4  PLT 229 247 267   Cardiac Enzymes:  Recent Labs Lab 03/05/16 0639  TROPONINI <0.03   BNP: Invalid input(s): POCBNP CBG: No results for input(s): GLUCAP in the last 168 hours. D-Dimer No results for input(s): DDIMER in the last 72 hours. Hgb A1c No results for input(s): HGBA1C in the last 72 hours. Lipid Profile  Recent Labs  03/05/16 0639  CHOL 110  HDL 30*  LDLCALC 63  TRIG 85  CHOLHDL 3.7   Thyroid function studies  Recent Labs  03/04/16 1505  TSH 2.70   Anemia work up No results for input(s): VITAMINB12, FOLATE, FERRITIN, TIBC, IRON, RETICCTPCT in the last 72 hours. Urinalysis    Component Value Date/Time   COLORURINE YELLOW 03/05/2016 0423   APPEARANCEUR CLEAR 03/05/2016 0423   LABSPEC 1.041* 03/05/2016 0423   PHURINE 6.0 03/05/2016 0423   GLUCOSEU NEGATIVE 03/05/2016 0423   HGBUR NEGATIVE 03/05/2016 0423   BILIRUBINUR NEGATIVE 03/05/2016 0423   KETONESUR NEGATIVE 03/05/2016 0423   PROTEINUR NEGATIVE 03/05/2016 0423   UROBILINOGEN 1.0 01/14/2014 0032   NITRITE NEGATIVE 03/05/2016 0423   LEUKOCYTESUR NEGATIVE 03/05/2016 0423   Sepsis Labs Invalid input(s): PROCALCITONIN,  WBC,  LACTICIDVEN Microbiology No results found for this or any previous visit (from the past 240 hour(s)).   SIGNED:   Jerald Kief, MD  [facility] 03/07/2016, 11:18 AM  If 7PM-7AM, please contact night-coverage [email or web address] Password [email or web address]

## 2016-03-10 ENCOUNTER — Other Ambulatory Visit (HOSPITAL_COMMUNITY): Payer: BLUE CROSS/BLUE SHIELD

## 2016-03-16 ENCOUNTER — Ambulatory Visit: Payer: BLUE CROSS/BLUE SHIELD | Admitting: Gastroenterology

## 2016-03-17 ENCOUNTER — Encounter: Payer: Self-pay | Admitting: Internal Medicine

## 2016-03-17 ENCOUNTER — Ambulatory Visit (INDEPENDENT_AMBULATORY_CARE_PROVIDER_SITE_OTHER): Payer: BLUE CROSS/BLUE SHIELD | Admitting: Internal Medicine

## 2016-03-17 VITALS — BP 98/74 | HR 60 | Ht 72.0 in | Wt 304.6 lb

## 2016-03-17 DIAGNOSIS — I420 Dilated cardiomyopathy: Secondary | ICD-10-CM | POA: Diagnosis not present

## 2016-03-17 DIAGNOSIS — I484 Atypical atrial flutter: Secondary | ICD-10-CM

## 2016-03-17 DIAGNOSIS — I5022 Chronic systolic (congestive) heart failure: Secondary | ICD-10-CM | POA: Diagnosis not present

## 2016-03-17 MED ORDER — SPIRONOLACTONE 25 MG PO TABS: 12.5000 mg | ORAL_TABLET | Freq: Every day | ORAL | Status: DC

## 2016-03-17 MED ORDER — APIXABAN 5 MG PO TABS: 5.0000 mg | ORAL_TABLET | Freq: Two times a day (BID) | ORAL | Status: DC

## 2016-03-17 MED ORDER — LISINOPRIL 5 MG PO TABS: 5.0000 mg | ORAL_TABLET | Freq: Every day | ORAL | Status: DC

## 2016-03-17 NOTE — Patient Instructions (Signed)
Medication Instructions: - Your physician has recommended you make the following change in your medication:  1) Stop amiodarone 2) Stop aspirin 3) Start Eliquis 5 mg one tablet by mouth twice daily 4) Start aldactone (spironolactone) 25 mg take 1/2 tablet (12.5 mg) by mouth once daily  Labwork: - Your physician recommends that you return for lab work in: 2 weeks- BMP (same day as follow up with Lorin Picket)  Procedures/Testing: - Your physician has requested that you have a cardiac MRI. Cardiac MRI uses a computer to create images of your heart as its beating, producing both still and moving pictures of your heart and major blood vessels. For further information please visit [email or web address]. Please follow the instruction sheet given to you today for more information.  Follow-Up: - Your physician recommends that you schedule a follow-up appointment in: 2 weeks with Tereso Newcomer, PA  - You have been referred to : Dr. Hillis Range- consideration of possible ablation  Any Additional Special Instructions Will Be Listed Below (If Applicable).     If you need a refill on your cardiac medications before your next appointment, please call your pharmacy.

## 2016-03-17 NOTE — Progress Notes (Signed)
ELECTROPHYSIOLOGY CONSULT NOTE  Patient ID: Nathaniel [name], MRN: [id number], DOB/AGE: Apr 26, 1991 24 y.o. Admit date: (Not on file) Date of Consult: 03/17/2016  Primary Physician: No PCP Per Patient Primary Cardiologist: new Consulting Physician new  Chief Complaint: atrial flutter   HPI Nathaniel [name] is a 25 y.o. male  Referred for consideration of treatment options for atrial flutter.  He presented to the hospital with abdominal pain with a diagnosis of pancreatitis. He was initially seen in [facility] a couple of years ago and presented to [facility]. He was found to be in atrial flutter with 2-1 conduction. He subsequently developed atrial fibrillation. Echocardiogram subsequent demonstrated ejection fraction of 35-40% with left atrial enlargement. Anticoagulation was withheld because of his pancreatic pseudocyst which notably increased in size from 3.65-->>47.9  GI was involved and signed off.  He was in seen in the office and Holter monitor obtained demonstrating recurrent episodes of atrial fibrillation with modestly rapid rates; he also had sinus bradycardia with relatively modest posttermination pauses and  some ventricular couplets.  He denies dyspnea on exertion, edema, syncope. He has had some palpitations but not of late.  Family history is negative for structural heart disease but positive for arrhythmia.  He was noted to have significant snoring at the hospital and sleep study is pending.  Abdominal pain is resolved. No bleeding issues  No longer drinking        Past Medical History  Diagnosis Date  . Pancreatitis 2012    recurrent acute. Admissions to [facility], 12/2015 admissin to [facility] hospital, ct then shows pseudocyst.   . Obesity     BMI 45 12/2015  . Fatty liver 12/2015    per CT  . Atrial flutter (HCC) 01/17/2016    Admx 5/17 with AFlutter with RVR in setting of pancreatitis // CHADS2-VASc=1 (CHF) // Anticoag not started // Amiodarone >> NSR   .  Chronic systolic CHF (congestive heart failure) (HCC)     Echo 01/17/16 - EF 35-40%, diffuse HK, mild LAE   . 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HCC) 02/03/2016    EF 35-40% in setting of AFlutter with RVR in 5/17 // probably tachycardia mediated // needs FU echo 2-3 mos after NSR restored       Surgical History:  Past Surgical History  Procedure Laterality Date  . Hand surgery       Home Meds: Prior to Admission medications   Medication Sig Start Date End Date Taking? Authorizing Provider  amiodarone (PACERONE) 200 MG tablet Take 1 tablet (200 mg total) by mouth daily. 03/01/16   Beatrice Lecher, PA-C  aspirin 81 MG chewable tablet Chew 1 tablet (81 mg total) by mouth daily. 01/27/16   Marinda Elk, MD  ibuprofen (ADVIL,MOTRIN) 200 MG tablet Take 400 mg by mouth every 6 (six) hours as needed for mild pain.    Historical Provider, MD  lisinopril (PRINIVIL,ZESTRIL) 5 MG tablet Take 1 tablet (5 mg total) by mouth daily. 03/01/16   Beatrice Lecher, PA-C  metoprolol succinate (TOPROL-XL) 50 MG 24 hr tablet Take 1 tablet (50 mg total) by mouth daily. Take with or immediately following a meal. 02/03/16   Beatrice Lecher, PA-C  Omega-3 Fatty Acids (FISH OIL PO) Take 1 capsule by mouth daily.    Historical Provider, MD  ondansetron (ZOFRAN ODT) 8 MG disintegrating tablet Take 1 tablet (8 mg total) by mouth every 8 (eight) hours as needed for nausea or vomiting. 01/27/16   Lambert Keto  Robb Matar, MD  traMADol (ULTRAM) 50 MG tablet Take 1 tablet (50 mg total) by mouth every 6 (six) hours as needed for moderate pain. 03/07/16   Jerald Kief, MD    Allergies: No Known Allergies  Social History   Social History  . Marital Status: Single    Spouse Name: N/A  . Number of Children: 0  . Years of Education: 12   Occupational History  .  [employer]   Social History Main Topics  . Smoking status: Former Games developer  . Smokeless tobacco: Current User  . Alcohol Use: Yes  . Drug Use: No  . Sexual Activity:  Yes    Birth Control/ Protection: None   Other Topics Concern  . Not on file   Social History Narrative     Family History  Problem Relation Age of Onset  . Diabetes Mother     ROS:  Please see the history of present illness.     All other systems reviewed and negative.    Physical Exam: Blood pressure 98/74, pulse 60, height 6' (1.829 m), weight 304 lb 9.6 oz (138.166 kg), SpO2 98 %. General: Well developed, well nourished male in no acute distress. Head: Normocephalic, atraumatic, sclera non-icteric, no xanthomas, nares are without discharge. EENT: normal  Lymph Nodes:  none Neck: Negative for carotid bruits. JVD not elevated. Back:without scoliosis kyphosis  Lungs: Clear bilaterally to auscultation without wheezes, rales, or rhonchi. Breathing is unlabored. Heart: Irregualrly irregular with S1 S2. No  murmur . No rubs, or gallops appreciated. Abdomen: Soft, non-tender, non-distended with normoactive bowel sounds. No hepatomegaly. No rebound/guarding. No obvious abdominal masses. Msk:  Strength and tone appear normal for age. Extremities: No clubbing or cyanosis. No  edema.  Distal pedal pulses are 2+ and equal bilaterally. Skin: Warm and Dry Neuro: Alert and oriented X 3. CN III-XII intact Grossly normal sensory and motor function . Psych:  Responds to questions appropriately with a normal affect.      Labs: Cardiac Enzymes No results for input(s): CKTOTAL, CKMB, TROPONINI in the last 72 hours. CBC Lab Results  Component Value Date   WBC 6.5 03/06/2016   HGB 12.7* 03/06/2016   HCT 38.7* 03/06/2016   MCV 89.4 03/06/2016   PLT 267 03/06/2016   PROTIME: No results for input(s): LABPROT, INR in the last 72 hours. Chemistry No results for input(s): NA, K, CL, CO2, BUN, CREATININE, CALCIUM, PROT, BILITOT, ALKPHOS, ALT, AST, GLUCOSE in the last 168 hours.  Invalid input(s): LABALBU Lipids Lab Results  Component Value Date   CHOL 110 03/05/2016   HDL 30* 03/05/2016     LDLCALC 63 03/05/2016   TRIG 85 03/05/2016   BNP No results found for: PROBNP Thyroid Function Tests: No results for input(s): TSH, T4TOTAL, T3FREE, THYROIDAB in the last 72 hours.  Invalid input(s): FREET3 Miscellaneous No results found for: DDIMER  Radiology/Studies:  Ct Abdomen Pelvis W Contrast  03/05/2016  CLINICAL DATA:  Acute onset of right upper quadrant abdominal pain. Leukocytosis and elevated lipase. Initial encounter. EXAM: CT ABDOMEN AND PELVIS WITH CONTRAST TECHNIQUE: Multidetector CT imaging of the abdomen and pelvis was performed using the standard protocol following bolus administration of intravenous contrast. CONTRAST:  ISOVUE-300 IOPAMIDOL (ISOVUE-300) INJECTION 61% COMPARISON:  CT of the abdomen and pelvis performed 01/17/2016, and abdominal ultrasound performed 01/23/2016 FINDINGS: Mild bibasilar atelectasis is noted. Mild soft tissue inflammation is noted about the pancreas, with inflammation extending about the antrum of the stomach. There appears to be a  7.9 x 3.4 x 4.2 cm pseudocyst adjacent to the antrum of the stomach, with surrounding soft inflammation. Findings are compatible with acute pancreatitis. There is no evidence of devascularization. The liver is unremarkable in appearance. The spleen is mildly enlarged, measuring 13.0 cm in length. The gallbladder is within normal limits. The adrenal glands are unremarkable. The kidneys are unremarkable in appearance. There is no evidence of hydronephrosis. No renal or ureteral stones are seen. No perinephric stranding is appreciated. No free fluid is identified. The small bowel is unremarkable in appearance. The stomach is within normal limits. No acute vascular abnormalities are seen. The appendix is normal in caliber, without evidence of appendicitis. The colon is grossly unremarkable in appearance. The bladder is mildly distended and grossly unremarkable. The prostate remains normal in size. No inguinal  lymphadenopathy is seen. No acute osseous abnormalities are identified. IMPRESSION: 1. 7.9 x 3.4 x 4.2 cm pseudocyst adjacent to the antrum of the stomach, with surrounding soft inflammation. Mild soft tissue inflammation about the pancreas. Findings compatible with acute pancreatitis. No evidence of devascularization at this time. 2. Mild splenomegaly. 3. Mild bibasilar atelectasis noted. Electronically Signed   By: Roanna Raider M.D.   On: 03/05/2016 04:07    EKG:  01/17/16 atrial flutter 2:1  01/28/16  Atrial fibrillation ( nt atrial flutter  Holter -- repeated episodes of atrial fibrillation noted with onset-- some atrial flutter also noted Bradycardia  Assessment and Plan:  Cardiomyopathy--presumably nonischemic  Atrial fibrillation and flutter  Ventricular ectopy and couplets  Morbidly obese  Sleep disordered breathing and daytime somnolence  Sinus bradycardia, nocturnal and diurnal  Pancreatitis with Pseudocyst formation   The patient has cardiomyopathy presumably nonischemic. Its relationship to the atrial arrhythmia remains to be determined; there is some evidence of left atrial enlargement. We discussed cardiomyopathy and its risks and the need for guidelines directed therapy. In this regard we will continue his beta blocker and his lisinopril. We will stop his amiodarone and add Aldactone. We anticipate a metabolic profile in about 2 weeks.  We will also attempt a cardiac MRI which might be limited by his size. This will give [place] further information related to both heart muscle function as well as heart muscle structure and perhaps insights is to the mechanism of his cardiomyopathy  His bradycardia makes treatment of his arrhythmia even more challenging. Given his young age and the frequent episodes of atrial fibrillation I will review with Dr. Fawn Kirk when he returns as to whether we should pursue catheter ablation as a primary alternative. In this regard it is noteworthy that  while he was on amiodarone he has continued to have arrhythmia.  He has a sleep study scheduled for early August. It would be important that this be accomplished in the issues addressed prior to proceeding with catheter ablation.  His thromboembolic risk score is at least 1 (CHADS-VASc) given his cardiomyopathy;  given the anticipation of catheter ablation we will initiate therapy with apixoban and discontinue aspirin. However, we will recheck out to Dr. Christella Hartigan his GI doctor for input prior to initiating this change   Couplets are of some concern     Sherryl Manges

## 2016-03-18 ENCOUNTER — Encounter: Payer: Self-pay | Admitting: Internal Medicine

## 2016-03-18 NOTE — Progress Notes (Signed)
Spoke with Dr Christella Hartigan No GI reason not to use anticoagulation is indicated for his heart. I have spoken with the patient we will begin apixoban He is not drinking

## 2016-03-23 ENCOUNTER — Telehealth: Payer: Self-pay | Admitting: *Deleted

## 2016-03-23 ENCOUNTER — Other Ambulatory Visit (HOSPITAL_COMMUNITY): Payer: Self-pay

## 2016-03-23 ENCOUNTER — Telehealth: Payer: Self-pay | Admitting: Internal Medicine

## 2016-03-23 ENCOUNTER — Encounter: Payer: Self-pay | Admitting: Physician Assistant

## 2016-03-23 ENCOUNTER — Encounter: Payer: Self-pay | Admitting: Internal Medicine

## 2016-03-23 ENCOUNTER — Ambulatory Visit (HOSPITAL_COMMUNITY): Payer: BLUE CROSS/BLUE SHIELD | Attending: Cardiology

## 2016-03-23 DIAGNOSIS — Z6841 Body Mass Index (BMI) 40.0 and over, adult: Secondary | ICD-10-CM | POA: Insufficient documentation

## 2016-03-23 DIAGNOSIS — I484 Atypical atrial flutter: Secondary | ICD-10-CM | POA: Diagnosis not present

## 2016-03-23 DIAGNOSIS — I509 Heart failure, unspecified: Secondary | ICD-10-CM | POA: Insufficient documentation

## 2016-03-23 DIAGNOSIS — I517 Cardiomegaly: Secondary | ICD-10-CM | POA: Diagnosis not present

## 2016-03-23 DIAGNOSIS — I420 Dilated cardiomyopathy: Secondary | ICD-10-CM

## 2016-03-23 DIAGNOSIS — Z87891 Personal history of nicotine dependence: Secondary | ICD-10-CM | POA: Insufficient documentation

## 2016-03-23 DIAGNOSIS — I4892 Unspecified atrial flutter: Secondary | ICD-10-CM | POA: Diagnosis present

## 2016-03-23 DIAGNOSIS — E669 Obesity, unspecified: Secondary | ICD-10-CM | POA: Diagnosis not present

## 2016-03-23 NOTE — Telephone Encounter (Signed)
Called patient and gave him appointment date, time and location for cardiac MRI.  Also mailed out MRI letter with same information.

## 2016-03-24 NOTE — Telephone Encounter (Signed)
Pt has been notified of limited echo results and findings by phone with verbal understanding. Pt advised to keep appt for MRI. Pt agreeable.

## 2016-03-29 ENCOUNTER — Telehealth: Payer: Self-pay | Admitting: Internal Medicine

## 2016-03-29 NOTE — Telephone Encounter (Signed)
Error

## 2016-03-31 ENCOUNTER — Other Ambulatory Visit (INDEPENDENT_AMBULATORY_CARE_PROVIDER_SITE_OTHER): Payer: BLUE CROSS/BLUE SHIELD

## 2016-03-31 ENCOUNTER — Encounter: Payer: Self-pay | Admitting: Internal Medicine

## 2016-03-31 ENCOUNTER — Ambulatory Visit (INDEPENDENT_AMBULATORY_CARE_PROVIDER_SITE_OTHER): Payer: BLUE CROSS/BLUE SHIELD | Admitting: Internal Medicine

## 2016-03-31 VITALS — BP 110/70 | HR 101 | Ht 72.0 in | Wt 306.0 lb

## 2016-03-31 DIAGNOSIS — I4892 Unspecified atrial flutter: Secondary | ICD-10-CM | POA: Diagnosis not present

## 2016-03-31 DIAGNOSIS — I484 Atypical atrial flutter: Secondary | ICD-10-CM | POA: Diagnosis not present

## 2016-03-31 DIAGNOSIS — I420 Dilated cardiomyopathy: Secondary | ICD-10-CM

## 2016-03-31 DIAGNOSIS — Z8719 Personal history of other diseases of the digestive system: Secondary | ICD-10-CM

## 2016-03-31 DIAGNOSIS — R0683 Snoring: Secondary | ICD-10-CM

## 2016-03-31 DIAGNOSIS — I5022 Chronic systolic (congestive) heart failure: Secondary | ICD-10-CM

## 2016-03-31 LAB — BASIC METABOLIC PANEL
BUN: 16 mg/dL (ref 7–25)
CO2: 28 mmol/L (ref 20–31)
Calcium: 9 mg/dL (ref 8.6–10.3)
Chloride: 107 mmol/L (ref 98–110)
Creat: 1.16 mg/dL (ref 0.60–1.35)
Glucose, Bld: 94 mg/dL (ref 65–99)
Potassium: 4.6 mmol/L (ref 3.5–5.3)
Sodium: 142 mmol/L (ref 135–146)

## 2016-03-31 NOTE — Patient Instructions (Signed)
Medication Instructions:  Your physician recommends that you continue on your current medications as directed. Please refer to the Current Medication list given to you today.   Labwork: None ordered   Testing/Procedures: Your physician has recommended that you have an ablation. Catheter ablation is a medical procedure used to treat some cardiac arrhythmias (irregular heartbeats). During catheter ablation, a long, thin, flexible tube is put into a blood vessel in your groin (upper thigh), or neck. This tube is called an ablation catheter. It is then guided to your heart through the blood vessel. Radio frequency waves destroy small areas of heart tissue where abnormal heartbeats may cause an arrhythmia to start. Please see the instruction sheet given to you today.  Call if you decide to proceed with ablation   Follow-Up: Your physician recommends that you schedule a follow-up appointment in: 4 weeks with Dr Graciela Husbands   Any Other Special Instructions Will Be Listed Below (If Applicable).     If you need a refill on your cardiac medications before your next appointment, please call your pharmacy.

## 2016-03-31 NOTE — Progress Notes (Signed)
Electrophysiology Office Note   Date:  03/31/2016   ID:  Nathaniel [name], DOB 1990-11-26, MRN [id number]  PCP:  No PCP Per Patient Cardiologist:  none Primary Electrophysiologist: Dr Graciela Husbands  CC: afib   History of Present Illness: Nathaniel [name] is a 25 y.o. male who presents today for afib evaluation.   He reports initially being diagnosed with atrial fibrillation/ atrial flutter in the setting of acute pancreatitis 5/17.  He was initiated on amiodarone.  He was initially not on anticoagulation but this was stopped due to pancreatic pseudocyst.  Dr Graciela Husbands discussed with Dr Christella Hartigan and the patient was felt to be safe for initiation of anticoagulation.  He was started on eliquis 2 weeks ago.  He feels that he is tolerating this without issues.  He feels that his afib "comes and goes" though he has been in it more persistently over the past few days.  During afib, he notices palpitations.  He has been found to have a cardiomyopathy of unclear origin (felt to be tachy mediated).   He has not had an ischemic evaluation.  Today, he denies symptoms of  chest pain, shortness of breath, orthopnea, PND, lower extremity edema, claudication, dizziness, presyncope, syncope, bleeding, or neurologic sequela. The patient is tolerating medications without difficulties and is otherwise without complaint today.    Past Medical History:  Diagnosis Date  . Atrial flutter (HCC) 01/17/2016   Admx 5/17 with AFlutter with RVR in setting of pancreatitis // CHADS2-VASc=1 (CHF) // Anticoag not started // Amiodarone >> NSR   . Chronic systolic CHF (congestive heart failure) (HCC)    Echo 01/17/16 - EF 35-40%, diffuse HK, mild LAE   . 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HCC) 02/03/2016   a. EF 35-40% in setting of AFlutter with RVR in 5/17 // probably tachycardia mediated // needs FU echo 2-3 mos after NSR restored // b. Limited Echo 7/17: EF 45-50%, diff HK, mild LAE  . Fatty liver 12/2015   per CT  . Obesity    BMI 45  12/2015  . Pancreatitis 2012   recurrent acute. Admissions to [facility], 12/2015 admissin to [facility] hospital, ct then shows pseudocyst.    Past Surgical History:  Procedure Laterality Date  . HAND SURGERY       Current Outpatient Prescriptions  Medication Sig Dispense Refill  . apixaban (ELIQUIS) 5 MG TABS tablet Take 1 tablet (5 mg total) by mouth 2 (two) times daily. 60 tablet 11  . ibuprofen (ADVIL,MOTRIN) 200 MG tablet Take 400 mg by mouth every 6 (six) hours as needed for mild pain.    [name] lisinopril (PRINIVIL,ZESTRIL) 5 MG tablet Take 1 tablet (5 mg total) by mouth daily. 30 tablet 10  . metoprolol succinate (TOPROL-XL) 50 MG 24 hr tablet Take 1 tablet (50 mg total) by mouth daily. Take with or immediately following a meal. 90 tablet 3  . Omega-3 Fatty Acids (FISH OIL PO) Take 1 capsule by mouth daily.    . ondansetron (ZOFRAN ODT) 8 MG disintegrating tablet Take 1 tablet (8 mg total) by mouth every 8 (eight) hours as needed for nausea or vomiting. 20 tablet 0  . spironolactone (ALDACTONE) 25 MG tablet Take 0.5 tablets (12.5 mg total) by mouth daily. 30 tablet 6  . traMADol (ULTRAM) 50 MG tablet Take 1 tablet (50 mg total) by mouth every 6 (six) hours as needed for moderate pain. 30 tablet 0   No current facility-administered medications for this visit.     Allergies:   Review  of patient's allergies indicates no known allergies.   Social History:  The patient  reports that he has quit smoking. He uses smokeless tobacco. He reports that he drinks alcohol. He reports that he does not use drugs.   Family History:  The patient's  family history includes Diabetes in his mother.   Father and paternal aunts/uncles have "irregular heart beat".     ROS:  Please see the history of present illness.   All other systems are reviewed and negative.    PHYSICAL EXAM: VS:  BP 110/70 (BP Location: Left Arm, Patient Position: Sitting, Cuff Size: Large)   Pulse (!) 101   Ht 6' (1.829 m)   Wt (!) 306 lb  (138.8 kg)   BMI 41.50 kg/m  , BMI Body mass index is 41.5 kg/m. GEN: overweight, in no acute distress  HEENT: normal  Neck: no JVD, carotid bruits, or masses Cardiac: iRRR; no murmurs, rubs, or gallops,no edema  Respiratory:  clear to auscultation bilaterally, normal work of breathing GI: soft, nontender, nondistended, + BS MS: no deformity or atrophy  Skin: warm and dry  Neuro:  Strength and sensation are intact Psych: euthymic mood, full affect  EKG:  EKG is ordered today. The ekg ordered today shows afib, V rate 101 bpm, nonspecific ST/ T changes   Recent Labs: 01/18/2016: Magnesium 1.8 01/19/2016: B Natriuretic Peptide 146.9 03/04/2016: TSH 2.70 03/05/2016: ALT 19 03/06/2016: BUN 8; Creatinine, Ser 0.71; Hemoglobin 12.7; Platelets 267; Potassium 3.5; Sodium 140    Lipid Panel     Component Value Date/Time   CHOL 110 03/05/2016 0639   TRIG 85 03/05/2016 0639   HDL 30 (L) 03/05/2016 0639   CHOLHDL 3.7 03/05/2016 0639   VLDL 17 03/05/2016 0639   LDLCALC 63 03/05/2016 0639     Wt Readings from Last 3 Encounters:  03/31/16 (!) 306 lb (138.8 kg)  03/17/16 (!) 304 lb 9.6 oz (138.2 kg)  03/07/16 (!) 300 lb 14.4 oz (136.5 kg)      Other studies Reviewed: Additional studies/ records that were reviewed today include: Dr Koren Bound notes, prior echo, ecgs, holter  Review of the above records today demonstrates: as above   ASSESSMENT AND PLAN:  1.  Afib/ atrial flutter The patient has symptomatic atrial arrhythmias He has failed medical therapy with amiodarone.  He is anticoagualted with eliquis. Therapeutic strategies for afib/ atrial flutter including medicine and ablation were discussed in detail with the patient today. Risk, benefits, and alternatives to EP study and radiofrequency ablation were also discussed in detail today. These risks include but are not limited to stroke, bleeding, vascular damage, tamponade, perforation, damage to the esophagus, lungs, and other  structures, pulmonary vein stenosis, worsening renal function, and death. The patient understands these risk and wishes to contemplate this further.  He is not ready to proceed at this time.  He will contact my office if he wishes to proceed.  Otherwise, we will have him follow with Dr Graciela Husbands. Would require cardiac CT or TEE immediately prior to ablation if he decides to proceed.  Lifestyle modification for afib management discussed at length today.  Sleep study is pending.  2. Morbid obesity Body mass index is 41.5 kg/m. Weight loss advised  3. Snoring Sleep study pending  4. Cardiomyopathy Unclear cause MRI pending per Dr Graciela Husbands Consider ischemic workup if not improved after rate control  Follow-up:  With Dr Graciela Husbands in 4 weeks unless he decides to proceed with ablation  Current medicines are reviewed  at length with the patient today.   The patient does not have concerns regarding his medicines.  The following changes were made today:  None  Today, I have spent 40 minutes with the patient discussing afib management .  More than 50% of the visit time today was spent on this issue.     Randolm Idol, MD  03/31/2016 3:49 PM     [facility] [address] [place] [address] [phone] (office) [phone] (fax)

## 2016-04-02 ENCOUNTER — Other Ambulatory Visit: Payer: BLUE CROSS/BLUE SHIELD

## 2016-04-02 ENCOUNTER — Ambulatory Visit: Payer: BLUE CROSS/BLUE SHIELD | Admitting: Physician Assistant

## 2016-04-02 ENCOUNTER — Ambulatory Visit (HOSPITAL_COMMUNITY)
Admission: RE | Admit: 2016-04-02 | Discharge: 2016-04-02 | Disposition: A | Discharge status: Home / Self Care | Payer: BLUE CROSS/BLUE SHIELD | Source: Ambulatory Visit | Attending: Internal Medicine | Admitting: Internal Medicine

## 2016-04-02 DIAGNOSIS — I517 Cardiomegaly: Secondary | ICD-10-CM | POA: Diagnosis not present

## 2016-04-02 DIAGNOSIS — I420 Dilated cardiomyopathy: Secondary | ICD-10-CM | POA: Insufficient documentation

## 2016-04-02 MED ORDER — GADOBENATE DIMEGLUMINE 529 MG/ML IV SOLN
38.0000 mL | Freq: Once | INTRAVENOUS | Status: AC | PRN
  Administered 2016-04-02: 38 mL via INTRAVENOUS

## 2016-04-06 ENCOUNTER — Telehealth: Payer: Self-pay | Admitting: *Deleted

## 2016-04-06 NOTE — Telephone Encounter (Signed)
DPR on file ok to lmom. Lmom EF normal confirmed by MRI. If any questions call back [phone].

## 2016-04-11 ENCOUNTER — Ambulatory Visit (HOSPITAL_BASED_OUTPATIENT_CLINIC_OR_DEPARTMENT_OTHER): Payer: BLUE CROSS/BLUE SHIELD | Attending: Physician Assistant | Admitting: Cardiology

## 2016-04-11 VITALS — Ht 72.0 in | Wt 300.0 lb

## 2016-04-11 DIAGNOSIS — G4733 Obstructive sleep apnea (adult) (pediatric): Secondary | ICD-10-CM

## 2016-04-11 DIAGNOSIS — I480 Paroxysmal atrial fibrillation: Secondary | ICD-10-CM | POA: Diagnosis not present

## 2016-04-11 DIAGNOSIS — I491 Atrial premature depolarization: Secondary | ICD-10-CM | POA: Diagnosis not present

## 2016-04-11 DIAGNOSIS — R0683 Snoring: Secondary | ICD-10-CM | POA: Diagnosis not present

## 2016-04-12 NOTE — Procedures (Signed)
   Patient Name: Nathaniel [name], Nathaniel [name] Date: 04/11/2016 Gender: Male D.O.B: December 08, 1990 Age (years): 25 Referring Provider: Tereso Newcomer Height (inches): 70 Interpreting Physician: Armanda Magic MD, ABSM Weight (lbs): 300 RPSGT: Cherylann Parr BMI: 43 MRN: [id number] Neck Size: 18.00  CLINICAL INFORMATION Sleep Study Type: NPSG Indication for sleep study: Snoring Epworth Sleepiness Score: 6  SLEEP STUDY TECHNIQUE As per the AASM Manual for the Scoring of Sleep and Associated Events v2.3 (April 2016) with a hypopnea requiring 4% desaturations. The channels recorded and monitored were frontal, central and occipital EEG, electrooculogram (EOG), submentalis EMG (chin), nasal and oral airflow, thoracic and abdominal wall motion, anterior tibialis EMG, snore microphone, electrocardiogram, and pulse oximetry.  MEDICATIONS Patient's medications include: Reviewed in the chart. Medications self-administered by patient during sleep study : No sleep medicine administered.  SLEEP ARCHITECTURE The study was initiated at 9:57:42 PM and ended at 5:21:39 AM. Sleep onset time was 49.6 minutes and the sleep efficiency was 84.7%. The total sleep time was 376.0 minutes. Stage REM latency was 78.0 minutes. The patient spent 10.90% of the night in stage N1 sleep, 50.27% in stage N2 sleep, 10.64% in stage N3 and 28.19% in REM. Alpha intrusion was absent. Supine sleep was 39.76%.  RESPIRATORY PARAMETERS The overall apnea/hypopnea index (AHI) was 2.7 per hour. There were 0 total apneas, including 0 obstructive, 0 central and 0 mixed apneas. There were 17 hypopneas and 4 RERAs. The AHI during Stage REM sleep was 7.4 per hour. AHI while supine was 6.8 per hour. The mean oxygen saturation was 94.94%. The minimum SpO2 during sleep was 84.00%. Moderate snoring was noted during this study.  CARDIAC DATA The 2 lead EKG demonstrated sinus rhythm. The mean heart rate was 66.75 beats per minute. Other EKG  findings include: Frequent PAC's, frequent PAF  LEG MOVEMENT DATA The total PLMS were 1 with a resulting PLMS index of 0.16. Associated arousal with leg movement index was 0.0 .  IMPRESSIONS - No significant obstructive sleep apnea occurred during this study (AHI = 2.7/h). - No significant central sleep apnea occurred during this study (CAI = 0.0/h). - Mild oxygen desaturation was noted during this study (Min O2 = 84.00%). - The patient snored with Moderate snoring volume. - No cardiac abnormalities were noted during this study. - Clinically significant periodic limb movements did not occur during sleep. No significant associated arousals.  DIAGNOSIS - Snoring - Paroxysmal Atrial Fibrillation  RECOMMENDATIONS - Avoid alcohol, sedatives and other CNS depressants that may worsen sleep apnea and disrupt normal sleep architecture. - Sleep hygiene should be reviewed to assess factors that may improve sleep quality. - Weight management and regular exercise should be initiated or continued if appropriate.  Armanda Magic Diplomate, American Board of Sleep Medicine  ELECTRONICALLY SIGNED ON:  04/12/2016, 4:36 PM [facility] PH: [phone]   FX: [phone] ACCREDITED BY THE AMERICAN ACADEMY OF SLEEP MEDICINE

## 2016-04-13 NOTE — Progress Notes (Signed)
Patient has been informed of information. Stated verbal understanding.   I let him know that his Cardiologist would have these notes as well, and if they needed anything further they would call him.   Patient stated verbal understanding.

## 2016-04-15 ENCOUNTER — Encounter: Payer: Self-pay | Admitting: Internal Medicine

## 2016-04-29 ENCOUNTER — Ambulatory Visit (INDEPENDENT_AMBULATORY_CARE_PROVIDER_SITE_OTHER): Payer: BLUE CROSS/BLUE SHIELD | Admitting: Internal Medicine

## 2016-04-29 ENCOUNTER — Encounter: Payer: Self-pay | Admitting: Internal Medicine

## 2016-04-29 VITALS — BP 104/70 | HR 56 | Ht 72.0 in | Wt 302.8 lb

## 2016-04-29 DIAGNOSIS — I4891 Unspecified atrial fibrillation: Secondary | ICD-10-CM | POA: Diagnosis not present

## 2016-04-29 DIAGNOSIS — I484 Atypical atrial flutter: Secondary | ICD-10-CM | POA: Diagnosis not present

## 2016-04-29 DIAGNOSIS — R001 Bradycardia, unspecified: Secondary | ICD-10-CM

## 2016-04-29 DIAGNOSIS — I493 Ventricular premature depolarization: Secondary | ICD-10-CM | POA: Diagnosis not present

## 2016-04-29 DIAGNOSIS — I5022 Chronic systolic (congestive) heart failure: Secondary | ICD-10-CM

## 2016-04-29 DIAGNOSIS — I429 Cardiomyopathy, unspecified: Secondary | ICD-10-CM

## 2016-04-29 DIAGNOSIS — I428 Other cardiomyopathies: Secondary | ICD-10-CM

## 2016-04-29 MED ORDER — METOPROLOL SUCCINATE ER 50 MG PO TB24: ORAL_TABLET | ORAL | Status: DC

## 2016-04-29 MED ORDER — METOPROLOL SUCCINATE ER 25 MG PO TB24: 25.0000 mg | ORAL_TABLET | Freq: Every day | ORAL | 3 refills | Status: DC

## 2016-04-29 NOTE — Progress Notes (Signed)
Patient Care Team: No Pcp Per Patient as PCP - General (General Practice)   HPI  Nathaniel [name] is a 25 y.o. male Seen in follow-up for atrial fibrillation and atypical flutter. He also has ventricular ectopy and was found to have a cardiomyopathy that has intercurrently normalized. He has been on guideline directed therapy with Ace inhibitors beta blockers and Aldactone. MRI 8/17 demonstrated EF of 56%;    The patient denies chest pain, shortness of breath, nocturnal dyspnea, orthopnea or peripheral edema.  There have been no palpitations, lightheadedness or syncope.    Sleep study was neg  Past Medical History:  Diagnosis Date  . Atrial flutter (HCC) 01/17/2016   Admx 5/17 with AFlutter with RVR in setting of pancreatitis // CHADS2-VASc=1 (CHF) // Anticoag not started // Amiodarone >> NSR   . Chronic systolic CHF (congestive heart failure) (HCC)    Echo 01/17/16 - EF 35-40%, diffuse HK, mild LAE   . 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HCC) 02/03/2016   a. EF 35-40% in setting of AFlutter with RVR in 5/17 // probably tachycardia mediated // needs FU echo 2-3 mos after NSR restored // b. Limited Echo 7/17: EF 45-50%, diff HK, mild LAE  . Fatty liver 12/2015   per CT  . Obesity    BMI 45 12/2015  . Pancreatitis 2012   recurrent acute. [facility], [facility], ct then shows pseudocyst.     Past Surgical History:  Procedure Laterality Date  . HAND SURGERY      Current Outpatient Prescriptions  Medication Sig Dispense Refill  . apixaban (ELIQUIS) 5 MG TABS tablet Take 1 tablet (5 mg total) by mouth 2 (two) times daily. 60 tablet 11  . Cholecalciferol (VITAMIN D PO) Take 1 tablet by mouth daily.    [name]. ibuprofen (ADVIL,MOTRIN) 200 MG tablet Take 400 mg by mouth every 6 (six) hours as needed for mild pain.    [name]. lisinopril (PRINIVIL,ZESTRIL) 5 MG tablet Take 1 tablet (5 mg total) by mouth daily. 30 tablet 10  . metoprolol succinate (TOPROL-XL) 50 MG 24 hr  tablet Take 1 tablet (50 mg total) by mouth daily. Take with or immediately following a meal. 90 tablet 3  . Omega-3 Fatty Acids (FISH OIL PO) Take 1 capsule by mouth daily.    . ondansetron (ZOFRAN ODT) 8 MG disintegrating tablet Take 1 tablet (8 mg total) by mouth every 8 (eight) hours as needed for nausea or vomiting. 20 tablet 0  . spironolactone (ALDACTONE) 25 MG tablet Take 0.5 tablets (12.5 mg total) by mouth daily. 30 tablet 6  . traMADol (ULTRAM) 50 MG tablet Take 1 tablet (50 mg total) by mouth every 6 (six) hours as needed for moderate pain. 30 tablet 0   No current facility-administered medications for this visit.     No Known Allergies    Review of Systems negative except from HPI and PMH  Physical Exam BP 104/70   Pulse (!) 56   Ht 6' (1.829 m)   Wt (!) 302 lb 12.8 oz (137.3 kg)   SpO2 97%   BMI 41.07 kg/m  Well developed and well nourished in no acute distress HENT normal E scleral and icterus clear Neck Supple JVP flat; carotids brisk and full Clear to ausculation  Regular rate and rhythm, no murmurs gallops or rub Soft with active bowel sounds No clubbing cyanosis  Edema Alert and oriented, grossly normal motor and sensory function Skin Warm and Dry  Sinus 46 16/08/45   Assessment and  Plan Atrial fibrillation/flutter now holding sinus rhythm  Cardiomyopathy-nonischemic presumably rate related now normalized  Sinus bradycardia  Morbidly obese    We'll decrease his Toprol from 50--25  We will plan to review echo in 3 mon and if still good will stop aldactone     Current medicines are reviewed at length with the patient today .  The patient does not  have concerns regarding medicines.

## 2016-04-29 NOTE — Patient Instructions (Signed)
Medication Instructions: - Your physician has recommended you make the following change in your medication:  1) Decrease metoprolol succinate to 50 mg 1/2 (25 mg) by mouth once daily  Labwork: - none  Procedures/Testing: - Your physician has requested that you have an echocardiogram- in 3 months. Echocardiography is a painless test that uses sound waves to create images of your heart. It provides your doctor with information about the size and shape of your heart and how well your heart's chambers and valves are working. This procedure takes approximately one hour. There are no restrictions for this procedure.   Follow-Up: - Your physician recommends that you schedule a follow-up appointment in: 3 months/ just after the echo with Dr. Graciela Husbands.  Any Additional Special Instructions Will Be Listed Below (If Applicable).     If you need a refill on your cardiac medications before your next appointment, please call your pharmacy.

## 2016-05-11 ENCOUNTER — Ambulatory Visit (INDEPENDENT_AMBULATORY_CARE_PROVIDER_SITE_OTHER): Payer: BLUE CROSS/BLUE SHIELD | Admitting: Gastroenterology

## 2016-05-11 ENCOUNTER — Encounter: Payer: Self-pay | Admitting: Gastroenterology

## 2016-05-11 VITALS — BP 90/62 | HR 72 | Ht 72.0 in | Wt 299.8 lb

## 2016-05-11 DIAGNOSIS — K859 Acute pancreatitis without necrosis or infection, unspecified: Secondary | ICD-10-CM

## 2016-05-11 DIAGNOSIS — K861 Other chronic pancreatitis: Secondary | ICD-10-CM

## 2016-05-11 NOTE — Patient Instructions (Signed)
MRI abd with MRCP images to follow pancreatitis, pancreatic pseudocyst in 2 months (will need script for valium to help with MRI) Please return to see Dr. Christella Hartigan in 2 months (shortly after the MRI above).

## 2016-05-11 NOTE — Progress Notes (Signed)
Review of pertinent gastrointestinal problems: 1. Recurrent acute pancreatitis. No gallstones, very slightly elevated IgG4, normal lipids; etoh abuse +/-; several episodes outside hospitals, imaging in [place] in 2017 show acute pancreatitis inflammation and cyst formation.     HPI: This is a very pleasant 25 year old man whom I last saw when he was hospitalized this past summer  Chief complaint is anchored otitis   Eating pretty well.  Appetite increasing.  abd pain is gone for at least 2-3 monhts.  Weight down since last hosp stay, intentionally.  Watching what he eats, trying to eat low salt.  Zero etoh.  ROS: complete GI ROS as described in HPI.  Constitutional:  No unintentional weight loss   Past Medical History:  Diagnosis Date  . Atrial flutter (HCC) 01/17/2016   Admx 5/17 with AFlutter with RVR in setting of pancreatitis // CHADS2-VASc=1 (CHF) // Anticoag not started // Amiodarone >> NSR   . Chronic systolic CHF (congestive heart failure) (HCC)    Echo 01/17/16 - EF 35-40%, diffuse HK, mild LAE   . 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HCC) 02/03/2016   a. EF 35-40% in setting of AFlutter with RVR in 5/17 // probably tachycardia mediated // needs FU echo 2-3 mos after NSR restored // b. Limited Echo 7/17: EF 45-50%, diff HK, mild LAE  . Fatty liver 12/2015   per CT  . Obesity    BMI 45 12/2015  . Pancreatitis 2012   recurrent acute. Admissions to [facility], 12/2015 admissin to [facility] hospital, ct then shows pseudocyst.     Past Surgical History:  Procedure Laterality Date  . HAND SURGERY      Current Outpatient Prescriptions  Medication Sig Dispense Refill  . apixaban (ELIQUIS) 5 MG TABS tablet Take 1 tablet (5 mg total) by mouth 2 (two) times daily. 60 tablet 11  . Cholecalciferol (VITAMIN D PO) Take 1 tablet by mouth daily.    [name] ibuprofen (ADVIL,MOTRIN) 200 MG tablet Take 400 mg by mouth every 6 (six) hours as needed for mild pain.    [name] lisinopril (PRINIVIL,ZESTRIL) 5 MG tablet Take 1  tablet (5 mg total) by mouth daily. 30 tablet 10  . metoprolol succinate (TOPROL-XL) 25 MG 24 hr tablet Take 1 tablet (25 mg total) by mouth daily. 90 tablet 3  . Omega-3 Fatty Acids (FISH OIL PO) Take 1 capsule by mouth daily.    . ondansetron (ZOFRAN ODT) 8 MG disintegrating tablet Take 1 tablet (8 mg total) by mouth every 8 (eight) hours as needed for nausea or vomiting. 20 tablet 0  . spironolactone (ALDACTONE) 25 MG tablet Take 0.5 tablets (12.5 mg total) by mouth daily. 30 tablet 6  . traMADol (ULTRAM) 50 MG tablet Take 1 tablet (50 mg total) by mouth every 6 (six) hours as needed for moderate pain. 30 tablet 0   No current facility-administered medications for this visit.     Allergies as of 05/11/2016  . (No Known Allergies)    Family History  Problem Relation Age of Onset  . Diabetes Mother     Social History   Social History  . Marital status: Single    Spouse name: N/A  . Number of children: 0  . Years of education: 12   Occupational History  .  [employer]   Social History Main Topics  . Smoking status: Former Games developer  . Smokeless tobacco: Current User     Comment: quit tobacco a year ago,  quit chewing in May  . Alcohol use Yes  Comment: previously social, none since May  . Drug use: No  . Sexual activity: Yes    Birth control/ protection: None   Other Topics Concern  . Not on file   Social History Narrative   [place] in [place] with fiancee.  Works at [employer] in [place].     Physical Exam: BP 90/62 (BP Location: Left Arm, Patient Position: Sitting, Cuff Size: Large)   Pulse 72   Ht 6' (1.829 m)   Wt 299 lb 12.8 oz (136 kg)   BMI 40.66 kg/m  Constitutional: generally well-appearing Psychiatric: alert and oriented x3 Abdomen: soft, nontender, nondistended, no obvious ascites, no peritoneal signs, normal bowel sounds No peripheral edema noted in lower extremities  Assessment and plan: 25 y.o. male with Recurrent acute  pancreatitis  He has really done well for the past 2-3 months and I suspect his pancreatitis inflammation is resolved. 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 I would like to wait another 2 months before repeat imaging since it would likely not change management at this point. We will therefore get MRI of his pancreas and abdomen with MRCP images in 2 months in a short follow-up with me afterwards. He was a bit claustrophobic when he had a recent  cardiac MRI and so when we schedule the abdominal MRI will give him a prescription for Valium.   Rob Buntinganiel Lilana Blasko, MD [facility] 05/11/2016, 1:39 PM

## 2016-05-26 ENCOUNTER — Ambulatory Visit: Payer: BLUE CROSS/BLUE SHIELD | Admitting: Gastroenterology

## 2016-07-12 ENCOUNTER — Telehealth: Payer: Self-pay

## 2016-07-12 DIAGNOSIS — K859 Acute pancreatitis without necrosis or infection, unspecified: Secondary | ICD-10-CM

## 2016-07-12 DIAGNOSIS — K863 Pseudocyst of pancreas: Secondary | ICD-10-CM

## 2016-07-12 NOTE — Telephone Encounter (Signed)
-----   Message from Donata Duff, RN sent at 05/11/2016  1:53 PM EDT ----- MRI abd with MRCP images to follow pancreatitis, pancreatic pseudocyst in 2 months (will need script for valium to help with MRI) Please return to see Dr. Christella Hartigan in 2 months (shortly after the MRI above).

## 2016-07-12 NOTE — Telephone Encounter (Signed)
You have been scheduled for an MRI at [facility] on 07/27/16. Your appointment time is 9 am. Please arrive 15 minutes prior to your appointment time for registration purposes. Please make certain not to have anything to eat or drink 4 hours prior to your test. In addition, if you have any metal in your body, have a pacemaker or defibrillator, please be sure to let your ordering physician know. This test typically takes 45 minutes to 1 hour to complete.  BUN CREAT need prior order in EPIC   Pt has been instructed and will have labs prior.    Dr Christella Hartigan the pt needs valium prior to appt.  Please provide the dosage and quantity.

## 2016-07-13 NOTE — Telephone Encounter (Signed)
Ok, valium 5mg  pills, take one pill 2 hours prior to the MRI and repeat 30 minutes prior if you feel itis necessary.  Disp 2, refills, none.

## 2016-07-14 ENCOUNTER — Other Ambulatory Visit: Payer: Self-pay | Admitting: Gastroenterology

## 2016-07-14 MED ORDER — DIAZEPAM 5 MG PO TABS
5.0000 mg | ORAL_TABLET | ORAL | 0 refills | Status: DC
Start: 2016-07-14 — End: 2016-08-10

## 2016-07-14 NOTE — Telephone Encounter (Signed)
Pt has been notified and will come in and pick up prescription at the front desk

## 2016-07-15 ENCOUNTER — Other Ambulatory Visit (INDEPENDENT_AMBULATORY_CARE_PROVIDER_SITE_OTHER): Payer: BLUE CROSS/BLUE SHIELD

## 2016-07-15 DIAGNOSIS — K863 Pseudocyst of pancreas: Secondary | ICD-10-CM | POA: Diagnosis not present

## 2016-07-15 DIAGNOSIS — K859 Acute pancreatitis without necrosis or infection, unspecified: Secondary | ICD-10-CM | POA: Diagnosis not present

## 2016-07-15 LAB — CREATININE, SERUM: Creatinine, Ser: 0.8 mg/dL (ref 0.40–1.50)

## 2016-07-15 LAB — BUN: BUN: 17 mg/dL (ref 6–23)

## 2016-07-21 ENCOUNTER — Encounter: Payer: Self-pay | Admitting: *Deleted

## 2016-07-26 ENCOUNTER — Other Ambulatory Visit: Payer: Self-pay

## 2016-07-26 ENCOUNTER — Ambulatory Visit (HOSPITAL_BASED_OUTPATIENT_CLINIC_OR_DEPARTMENT_OTHER): Payer: BLUE CROSS/BLUE SHIELD

## 2016-07-26 DIAGNOSIS — I5022 Chronic systolic (congestive) heart failure: Secondary | ICD-10-CM | POA: Diagnosis not present

## 2016-07-26 DIAGNOSIS — I517 Cardiomegaly: Secondary | ICD-10-CM | POA: Diagnosis not present

## 2016-07-26 DIAGNOSIS — K863 Pseudocyst of pancreas: Secondary | ICD-10-CM | POA: Diagnosis not present

## 2016-07-26 DIAGNOSIS — E785 Hyperlipidemia, unspecified: Secondary | ICD-10-CM | POA: Diagnosis not present

## 2016-07-26 DIAGNOSIS — Z87891 Personal history of nicotine dependence: Secondary | ICD-10-CM | POA: Diagnosis not present

## 2016-07-26 DIAGNOSIS — Z6841 Body Mass Index (BMI) 40.0 and over, adult: Secondary | ICD-10-CM | POA: Diagnosis not present

## 2016-07-26 DIAGNOSIS — I420 Dilated cardiomyopathy: Secondary | ICD-10-CM | POA: Diagnosis not present

## 2016-07-26 DIAGNOSIS — I509 Heart failure, unspecified: Secondary | ICD-10-CM | POA: Diagnosis present

## 2016-07-26 DIAGNOSIS — K760 Fatty (change of) liver, not elsewhere classified: Secondary | ICD-10-CM | POA: Diagnosis not present

## 2016-07-26 DIAGNOSIS — K859 Acute pancreatitis without necrosis or infection, unspecified: Secondary | ICD-10-CM | POA: Diagnosis not present

## 2016-07-27 ENCOUNTER — Ambulatory Visit (HOSPITAL_COMMUNITY)
Admission: RE | Admit: 2016-07-27 | Discharge: 2016-07-27 | Disposition: A | Discharge status: Home / Self Care | Payer: BLUE CROSS/BLUE SHIELD | Source: Ambulatory Visit | Attending: Gastroenterology | Admitting: Gastroenterology

## 2016-07-27 ENCOUNTER — Other Ambulatory Visit: Payer: Self-pay | Admitting: Gastroenterology

## 2016-07-27 DIAGNOSIS — K760 Fatty (change of) liver, not elsewhere classified: Secondary | ICD-10-CM | POA: Insufficient documentation

## 2016-07-27 DIAGNOSIS — Z87891 Personal history of nicotine dependence: Secondary | ICD-10-CM | POA: Insufficient documentation

## 2016-07-27 DIAGNOSIS — E785 Hyperlipidemia, unspecified: Secondary | ICD-10-CM | POA: Insufficient documentation

## 2016-07-27 DIAGNOSIS — I5022 Chronic systolic (congestive) heart failure: Secondary | ICD-10-CM | POA: Insufficient documentation

## 2016-07-27 DIAGNOSIS — I420 Dilated cardiomyopathy: Secondary | ICD-10-CM | POA: Insufficient documentation

## 2016-07-27 DIAGNOSIS — K859 Acute pancreatitis without necrosis or infection, unspecified: Secondary | ICD-10-CM

## 2016-07-27 DIAGNOSIS — K863 Pseudocyst of pancreas: Secondary | ICD-10-CM

## 2016-07-27 DIAGNOSIS — I517 Cardiomegaly: Secondary | ICD-10-CM | POA: Insufficient documentation

## 2016-07-27 DIAGNOSIS — Z6841 Body Mass Index (BMI) 40.0 and over, adult: Secondary | ICD-10-CM | POA: Insufficient documentation

## 2016-07-27 DIAGNOSIS — R935 Abnormal findings on diagnostic imaging of other abdominal regions, including retroperitoneum: Secondary | ICD-10-CM | POA: Diagnosis not present

## 2016-07-27 MED ORDER — GADOBENATE DIMEGLUMINE 529 MG/ML IV SOLN
20.0000 mL | Freq: Once | INTRAVENOUS | Status: AC | PRN
  Administered 2016-07-27: 20 mL via INTRAVENOUS

## 2016-07-30 ENCOUNTER — Ambulatory Visit (INDEPENDENT_AMBULATORY_CARE_PROVIDER_SITE_OTHER): Payer: BLUE CROSS/BLUE SHIELD | Admitting: Internal Medicine

## 2016-07-30 ENCOUNTER — Encounter: Payer: Self-pay | Admitting: Internal Medicine

## 2016-07-30 VITALS — BP 112/74 | HR 97 | Ht 72.0 in | Wt 295.0 lb

## 2016-07-30 DIAGNOSIS — I484 Atypical atrial flutter: Secondary | ICD-10-CM | POA: Diagnosis not present

## 2016-07-30 DIAGNOSIS — R001 Bradycardia, unspecified: Secondary | ICD-10-CM | POA: Diagnosis not present

## 2016-07-30 DIAGNOSIS — I428 Other cardiomyopathies: Secondary | ICD-10-CM | POA: Diagnosis not present

## 2016-07-30 DIAGNOSIS — I4891 Unspecified atrial fibrillation: Secondary | ICD-10-CM | POA: Diagnosis not present

## 2016-07-30 NOTE — Progress Notes (Signed)
Patient Care Team: No Pcp Per Patient as PCP - General (General Practice)   HPI  Nathaniel FilbertJonathan Marasco is a 25 y.o. male Seen in follow-up for atrial fibrillation and atypical flutter. He also has ventricular ectopy and was found to have a cardiomyopathy that has intercurrently normalized. He has been on guideline directed therapy with Ace inhibitors beta blockers and Aldactone. MRI 8/17 demonstrated EF of 56%;    Echocardiogram 11/17 demonstrated sustained normal LV function. There was evidence of some right ventricular and right atrial enlargement  notably however, MRI scanning 8/17 demonstrated only mild biatrial enlargement and no right ventricular enlargement.  ECG  Sinus Huston FoleyBrady  The patient denies chest pain, shortness of breath, nocturnal dyspnea, orthopnea or peripheral edema.  There have been no   lightheadedness or syncope.    TE risk profile Cardiomyopathy   He is intermittently aware of atrial fibrillation primarily when he sitting down.  Sleep study was neg  Past Medical History:  Diagnosis Date  . Atrial flutter (HCC) 01/17/2016   Admx 5/17 with AFlutter with RVR in setting of pancreatitis // CHADS2-VASc=1 (CHF) // Anticoag not started // Amiodarone >> NSR   . Chronic systolic CHF (congestive heart failure) (HCC)    Echo 01/17/16 - EF 35-40%, diffuse HK, mild LAE   . 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HCC) 02/03/2016   a. EF 35-40% in setting of AFlutter with RVR in 5/17 // probably tachycardia mediated // needs FU echo 2-3 mos after NSR restored // b. Limited Echo 7/17: EF 45-50%, diff HK, mild LAE  . Fatty liver 12/2015   per CT  . Obesity    BMI 45 12/2015  . Pancreatitis 2012   recurrent acute. [facility], [facility], ct then shows pseudocyst.     Past Surgical History:  Procedure Laterality Date  . HAND SURGERY      Current Outpatient Prescriptions  Medication Sig Dispense Refill  . apixaban (ELIQUIS) 5 MG TABS tablet Take 1  tablet (5 mg total) by mouth 2 (two) times daily. 60 tablet 11  . Cholecalciferol (VITAMIN D PO) Take 1 tablet by mouth daily.    . diazepam (VALIUM) 5 MG tablet Take 1 tablet (5 mg total) by mouth as directed. Take 1 pill 2 hours prior to MRI and repeat 30 minutes prior to MRI 2 tablet 0  . ibuprofen (ADVIL,MOTRIN) 200 MG tablet Take 400 mg by mouth every 6 (six) hours as needed for mild pain.    [name]. lisinopril (PRINIVIL,ZESTRIL) 5 MG tablet Take 1 tablet (5 mg total) by mouth daily. 30 tablet 10  . metoprolol succinate (TOPROL-XL) 25 MG 24 hr tablet Take 1 tablet (25 mg total) by mouth daily. 90 tablet 3  . Omega-3 Fatty Acids (FISH OIL PO) Take 1 capsule by mouth daily.    . ondansetron (ZOFRAN ODT) 8 MG disintegrating tablet Take 1 tablet (8 mg total) by mouth every 8 (eight) hours as needed for nausea or vomiting. 20 tablet 0  . spironolactone (ALDACTONE) 25 MG tablet Take 0.5 tablets (12.5 mg total) by mouth daily. 30 tablet 6  . traMADol (ULTRAM) 50 MG tablet Take 1 tablet (50 mg total) by mouth every 6 (six) hours as needed for moderate pain. 30 tablet 0   No current facility-administered medications for this visit.     No Known Allergies    Review of Systems negative except from HPI and PMH  Physical Exam BP 112/74   Pulse  97   Ht 6' (1.829 m)   Wt 295 lb (133.8 kg)   SpO2 98%   BMI 40.01 kg/m  Well developed and well nourished in no acute distress HENT normal E scleral and icterus clear Neck Supple JVP flat; carotids brisk and full Clear to ausculation  Regular rate and rhythm, no murmurs gallops or rub And then variably irregularly irregular Soft with active bowel sounds No clubbing cyanosis  Edema Alert and oriented, grossly normal motor and sensory function Skin Warm and Dry  ECG demonstrates atrial fibrillation at 97 Intervals-/09/31   Assessment and  Plan Atrial fibrillation/flutter paroxysmal  Cardiomyopathy-nonischemic presumably rate related now  normalized  Sinus bradycardia  Morbidly obese  His atrial fibrillation is paroxysmal. We looked at his echo today and he was in sinus rhythm at that time. We will try to get an estimate of his atrial fibrillation burden. Given his problems with presumably rate related cardiomyopathy we will anticipate the initiation of antiarrhythmic therapy and use the frequency of his recurrent atrial fibrillation as detected by his taking of his own pulse as a guide for effectiveness.  I will have him come to the A. fib clinic in 2 weeks. We'll then plan initiating him on flecainide 100 mg twice daily.  He will need subsequent treadmill testing.  We'll try to follow him along for AF burden. We will continue his anticoagulation at this time not withstanding his CHADS-VASc score of only 1. As relates to his pancreatitis, need to make sure there is no hemorrhagic issues here though.   With interval resolution of his cardio myopathy, we will plan to stop his Aldactone  More than 50% of 45 min was spent in counseling related to the above   Current medicines are reviewed at length with the patient today .  The patient does not  have concerns regarding medicines.

## 2016-07-30 NOTE — Patient Instructions (Addendum)
Medication Instructions: - Your physician has recommended you make the following change in your medication:  1) Stop aldactone (spironolactone)   Labwork: - none ordered  Procedures/Testing: - none ordered  Follow-Up: - Your physician recommends that you schedule a follow-up appointment in: 2 weeks with Rudi Coco, NP in the A-fib Clinic (new pt)  Any Additional Special Instructions Will Be Listed Below (If Applicable).     If you need a refill on your cardiac medications before your next appointment, please call your pharmacy.

## 2016-08-02 ENCOUNTER — Telehealth (HOSPITAL_COMMUNITY): Payer: Self-pay | Admitting: *Deleted

## 2016-08-02 NOTE — Telephone Encounter (Signed)
-----   Message from [name] sent at 07/30/2016 10:25 AM EST ----- Regarding: NEW PT 2 week f/u with Nathaniel [name] Contact: [phone] Per AVS pt is to be new pt with Nathaniel [name] and needs a 2 week in afib clinic.   Lamar Laundry

## 2016-08-02 NOTE — Telephone Encounter (Signed)
Pt mailbox is full, cld not LM.  Will try back

## 2016-08-10 ENCOUNTER — Ambulatory Visit (HOSPITAL_COMMUNITY)
Admission: RE | Admit: 2016-08-10 | Discharge: 2016-08-10 | Disposition: A | Discharge status: Home / Self Care | Payer: BLUE CROSS/BLUE SHIELD | Source: Ambulatory Visit | Attending: Nurse Practitioner | Admitting: Nurse Practitioner

## 2016-08-10 ENCOUNTER — Encounter (HOSPITAL_COMMUNITY): Payer: Self-pay | Admitting: Nurse Practitioner

## 2016-08-10 VITALS — BP 110/72 | HR 64 | Ht 72.0 in | Wt 292.8 lb

## 2016-08-10 DIAGNOSIS — Z79899 Other long term (current) drug therapy: Secondary | ICD-10-CM | POA: Insufficient documentation

## 2016-08-10 DIAGNOSIS — R9431 Abnormal electrocardiogram [ECG] [EKG]: Secondary | ICD-10-CM | POA: Diagnosis not present

## 2016-08-10 DIAGNOSIS — I484 Atypical atrial flutter: Secondary | ICD-10-CM | POA: Diagnosis not present

## 2016-08-10 DIAGNOSIS — E669 Obesity, unspecified: Secondary | ICD-10-CM | POA: Diagnosis not present

## 2016-08-10 DIAGNOSIS — Z87891 Personal history of nicotine dependence: Secondary | ICD-10-CM | POA: Diagnosis not present

## 2016-08-10 DIAGNOSIS — Z7901 Long term (current) use of anticoagulants: Secondary | ICD-10-CM | POA: Insufficient documentation

## 2016-08-10 DIAGNOSIS — I4892 Unspecified atrial flutter: Secondary | ICD-10-CM | POA: Diagnosis present

## 2016-08-10 MED ORDER — FLECAINIDE ACETATE 100 MG PO TABS: 100.0000 mg | ORAL_TABLET | Freq: Two times a day (BID) | ORAL | 3 refills | Status: DC

## 2016-08-10 NOTE — Progress Notes (Signed)
Primary Care Physician: No PCP Per Patient Referring Physician: Dr. Luciana AxeKline   Nathaniel [name] is a 25 y.o. male with a h/o of obesity, previous tobacco and alcohol abuse, pancreatitis, A Flutter, in the afib clinic, for f/u of visit with Dr. Clide CliffKline 07/30/16. He was initially started on amiodarone when hospitalized in May and this was stopped in June due to concerns of side effects. When he saw Dr. Graciela HusbandsKlein , he described multiple episodes of rapid irregular heart beat and Dr. Graciela HusbandsKlein suggested coming here to get started on flecainide. Risks vrs benefit of drug discussed and pt is wanting to try. He did see Dr. Johney FrameAllred in August to consider ablation but pt was not ready for that. He continues on eliquis on chadsvasc score of 1. He had a reduced EF which has normalized by repeat echo.  He had a sleep study which was normal. He has stopped alcohol and tobacco. He does not exercise, is obese. Small amount caffeine.  Today, he denies symptoms of palpitations, chest pain, shortness of breath, orthopnea, PND, lower extremity edema, dizziness, presyncope, syncope, or neurologic sequela. The patient is tolerating medications without difficulties and is otherwise without complaint today.   Past Medical History:  Diagnosis Date  . Atrial flutter (HCC) 01/17/2016   Admx 5/17 with AFlutter with RVR in setting of pancreatitis // CHADS2-VASc=1 (CHF) // Anticoag not started // Amiodarone >> NSR   . Chronic systolic CHF (congestive heart failure) (HCC)    Echo 01/17/16 - EF 35-40%, diffuse HK, mild LAE   . 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HCC) 02/03/2016   a. EF 35-40% in setting of AFlutter with RVR in 5/17 // probably tachycardia mediated // needs FU echo 2-3 mos after NSR restored // b. Limited Echo 7/17: EF 45-50%, diff HK, mild LAE  . Fatty liver 12/2015   per CT  . Obesity    BMI 45 12/2015  . Pancreatitis 2012   recurrent acute. [facility], [facility], ct then shows pseudocyst.     Past Surgical History:  Procedure Laterality Date  . HAND SURGERY      Current Outpatient Prescriptions  Medication Sig Dispense Refill  . apixaban (ELIQUIS) 5 MG TABS tablet Take 1 tablet (5 mg total) by mouth 2 (two) times daily. 60 tablet 11  . Cholecalciferol (VITAMIN D PO) Take 1 tablet by mouth daily.    [name]. ibuprofen (ADVIL,MOTRIN) 200 MG tablet Take 400 mg by mouth every 6 (six) hours as needed for mild pain.    [name]. lisinopril (PRINIVIL,ZESTRIL) 5 MG tablet Take 1 tablet (5 mg total) by mouth daily. 30 tablet 10  . metoprolol succinate (TOPROL-XL) 25 MG 24 hr tablet Take 1 tablet (25 mg total) by mouth daily. 90 tablet 3  . Omega-3 Fatty Acids (FISH OIL PO) Take 1 capsule by mouth daily.    . traMADol (ULTRAM) 50 MG tablet Take 1 tablet (50 mg total) by mouth every 6 (six) hours as needed for moderate pain. 30 tablet 0  . flecainide (TAMBOCOR) 100 MG tablet Take 1 tablet (100 mg total) by mouth 2 (two) times daily. 60 tablet 3   No current facility-administered medications for this encounter.     No Known Allergies  Social History   Social History  . Marital status: Single    Spouse name: N/A  . Number of children: 0  . Years of education: 12   Occupational History  .  [employer]   Social History Main Topics  .  Smoking status: Former Games developer  . Smokeless tobacco: Current User     Comment: quit tobacco a year ago,  quit chewing in May  . Alcohol use Yes     Comment: previously social, none since May  . Drug use: No  . Sexual activity: Yes    Birth control/ protection: None   Other Topics Concern  . Not on file   Social History Narrative   Pt lives in [place] with fiancee.  Works at [employer] in [place].    Family History  Problem Relation Age of Onset  . Diabetes Mother     ROS- All systems are reviewed and negative except as per the HPI above  Physical Exam: Vitals:   08/10/16 1340  BP: 110/72  Pulse: 64  Weight: 292 lb 12.8 oz (132.8 kg)   Height: 6' (1.829 m)   Wt Readings from Last 3 Encounters:  08/10/16 292 lb 12.8 oz (132.8 kg)  07/30/16 295 lb (133.8 kg)  05/11/16 299 lb 12.8 oz (136 kg)    Labs: Lab Results  Component Value Date   NA 142 03/31/2016   K 4.6 03/31/2016   CL 107 03/31/2016   CO2 28 03/31/2016   GLUCOSE 94 03/31/2016   BUN 17 07/15/2016   CREATININE 0.80 07/15/2016   CALCIUM 9.0 03/31/2016   MG 1.8 01/18/2016   Lab Results  Component Value Date   INR 1.22 01/18/2016   Lab Results  Component Value Date   CHOL 110 03/05/2016   HDL 30 (L) 03/05/2016   LDLCALC 63 03/05/2016   TRIG 85 03/05/2016     GEN- The patient is well appearing, alert and oriented x 3 today.   Head- normocephalic, atraumatic Eyes-  Sclera clear, conjunctiva pink Ears- hearing intact Oropharynx- clear Neck- supple, no JVP Lymph- no cervical lymphadenopathy Lungs- Clear to ausculation bilaterally, normal work of breathing Heart- Regular rate and rhythm, no murmurs, rubs or gallops, PMI not laterally displaced GI- soft, NT, ND, + BS Extremities- no clubbing, cyanosis, or edema MS- no significant deformity or atrophy Skin- no rash or lesion Psych- euthymic mood, full affect Neuro- strength and sensation are intact  EKG- NSR at 64 bpm, pr int 144 ms, qrs int 84 ms, qtc 394 ms Epic records reviewed Echo-Study Conclusions  - Left ventricle: The cavity size was mildly dilated. Wall   thickness was normal. Systolic function was at the lower limits   of normal. The estimated ejection fraction was in the range of   50% to 55%. Wall motion was normal; there were no regional wall   motion abnormalities. Left ventricular diastolic function   parameters were normal. - Left atrium: The atrium was mildly dilated. - Right ventricle: The cavity size was mildly dilated. - Right atrium: The atrium was mildly dilated.  Impressions:  - There is further improvement in systolic function since July    2017.     Assessment and Plan: 1. Atrial flutter According to Dr. Odessa Fleming plan, will start Flecainide 100 mg bid  Continue on metoprolol 25 mg bid Continue eliquis 5 mg bid  F/u in one week for ekg f/u start of flecainide Will then scheduled pt for a GXT  [facility] [address] [place], [place] [address] [phone]

## 2016-08-10 NOTE — Patient Instructions (Signed)
Your physician has recommended you make the following change in your medication:  1)Start Flecainide 100mg twice a day  

## 2016-08-18 ENCOUNTER — Ambulatory Visit (HOSPITAL_COMMUNITY)
Admission: RE | Admit: 2016-08-18 | Discharge: 2016-08-18 | Disposition: A | Discharge status: Home / Self Care | Payer: BLUE CROSS/BLUE SHIELD | Source: Ambulatory Visit | Attending: Nurse Practitioner | Admitting: Nurse Practitioner

## 2016-08-18 DIAGNOSIS — I484 Atypical atrial flutter: Secondary | ICD-10-CM

## 2016-08-18 DIAGNOSIS — R9431 Abnormal electrocardiogram [ECG] [EKG]: Secondary | ICD-10-CM | POA: Insufficient documentation

## 2016-08-18 DIAGNOSIS — I4891 Unspecified atrial fibrillation: Secondary | ICD-10-CM | POA: Diagnosis present

## 2016-08-18 DIAGNOSIS — I4892 Unspecified atrial flutter: Secondary | ICD-10-CM | POA: Insufficient documentation

## 2016-08-18 NOTE — Progress Notes (Addendum)
Pt in for ekg after starting flecainide 100mg  bid. Rudi Coco NP to review EKG.   Pt was placed on flecainide 100 mg bid approximately one week ago and is in for ekg on flecainide. Ekg with out interval changes. He had one spell of afib while riding a 4 wheeler about 3 days starting flecainide that lasted 2 hours and felt very lightheaded. He will be scheduled for GXT in 2 weeks and f/u in office in one month.  EKG shows SR at 63 bpm, pr int 152 ms, qrs int 86 ms, qtc 417 ms

## 2016-08-25 ENCOUNTER — Ambulatory Visit (HOSPITAL_COMMUNITY)
Admission: RE | Admit: 2016-08-25 | Discharge: 2016-08-25 | Disposition: A | Discharge status: Home / Self Care | Payer: BLUE CROSS/BLUE SHIELD | Source: Ambulatory Visit | Attending: Nurse Practitioner | Admitting: Nurse Practitioner

## 2016-08-25 DIAGNOSIS — I484 Atypical atrial flutter: Secondary | ICD-10-CM | POA: Diagnosis not present

## 2016-09-29 ENCOUNTER — Inpatient Hospital Stay (HOSPITAL_COMMUNITY)
Admission: RE | Admit: 2016-09-29 | Payer: BLUE CROSS/BLUE SHIELD | Source: Ambulatory Visit | Admitting: Nurse Practitioner

## 2016-10-08 ENCOUNTER — Ambulatory Visit (HOSPITAL_COMMUNITY)
Admission: RE | Admit: 2016-10-08 | Discharge: 2016-10-08 | Disposition: A | Discharge status: Home / Self Care | Payer: BLUE CROSS/BLUE SHIELD | Source: Ambulatory Visit | Attending: Nurse Practitioner | Admitting: Nurse Practitioner

## 2016-10-08 ENCOUNTER — Encounter (HOSPITAL_COMMUNITY): Payer: Self-pay | Admitting: Nurse Practitioner

## 2016-10-08 VITALS — BP 114/72 | HR 71 | Ht 72.0 in | Wt 296.0 lb

## 2016-10-08 DIAGNOSIS — Z6841 Body Mass Index (BMI) 40.0 and over, adult: Secondary | ICD-10-CM | POA: Insufficient documentation

## 2016-10-08 DIAGNOSIS — I4891 Unspecified atrial fibrillation: Secondary | ICD-10-CM | POA: Insufficient documentation

## 2016-10-08 DIAGNOSIS — Z720 Tobacco use: Secondary | ICD-10-CM | POA: Diagnosis not present

## 2016-10-08 DIAGNOSIS — R9431 Abnormal electrocardiogram [ECG] [EKG]: Secondary | ICD-10-CM | POA: Insufficient documentation

## 2016-10-08 DIAGNOSIS — I498 Other specified cardiac arrhythmias: Secondary | ICD-10-CM | POA: Diagnosis not present

## 2016-10-08 DIAGNOSIS — Z79899 Other long term (current) drug therapy: Secondary | ICD-10-CM | POA: Insufficient documentation

## 2016-10-08 NOTE — Progress Notes (Signed)
AF Clinic Note  Date:  10/08/2016   ID:  Nathaniel [name], DOB 1991/03/25, MRN [id number]  PCP:  No PCP Per Patient Cardiologist:  none Primary Electrophysiologist: Dr Nathaniel [name]  CC: afib   History of Present Illness: Nathaniel [name] is a 26 y.o. male who presents today for afib evaluation.  He was recently started on flecainide.  He has done "better" with flecainide but continues to have intermittent episodes of afib.  During afib, he notices palpitations.   Today, he denies symptoms of  chest pain, shortness of breath, orthopnea, PND, lower extremity edema, claudication, dizziness, presyncope, syncope, bleeding, or neurologic sequela. The patient is tolerating medications without difficulties and is otherwise without complaint today.    Past Medical History:  Diagnosis Date  . Atrial flutter (HCC) 01/17/2016   Admx 5/17 with AFlutter with RVR in setting of pancreatitis // CHADS2-VASc=1 (CHF) // Anticoag not started // Amiodarone >> NSR   . Chronic systolic CHF (congestive heart failure) (HCC)    Echo 01/17/16 - EF 35-40%, diffuse HK, mild LAE   . 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HCC) 02/03/2016   a. EF 35-40% in setting of AFlutter with RVR in 5/17 // probably tachycardia mediated // needs FU echo 2-3 mos after NSR restored // b. Limited Echo 7/17: EF 45-50%, diff HK, mild LAE  . Fatty liver 12/2015   per CT  . Obesity    BMI 45 12/2015  . Pancreatitis 2012   recurrent acute. Admissions to [facility], 12/2015 admissin to [facility] hospital, ct then shows pseudocyst.    Past Surgical History:  Procedure Laterality Date  . HAND SURGERY       Current Outpatient Prescriptions  Medication Sig Dispense Refill  . apixaban (ELIQUIS) 5 MG TABS tablet Take 1 tablet (5 mg total) by mouth 2 (two) times daily. 60 tablet 11  . Cholecalciferol (VITAMIN D PO) Take 1 tablet by mouth daily.    . flecainide (TAMBOCOR) 100 MG tablet Take 1 tablet (100 mg total) by mouth 2 (two) times daily. 60 tablet 3  .  ibuprofen (ADVIL,MOTRIN) 200 MG tablet Take 400 mg by mouth every 6 (six) hours as needed for mild pain.    [name] lisinopril (PRINIVIL,ZESTRIL) 5 MG tablet Take 1 tablet (5 mg total) by mouth daily. 30 tablet 10  . metoprolol succinate (TOPROL-XL) 25 MG 24 hr tablet Take 1 tablet (25 mg total) by mouth daily. 90 tablet 3  . Omega-3 Fatty Acids (FISH OIL PO) Take 1 capsule by mouth daily.    . traMADol (ULTRAM) 50 MG tablet Take 1 tablet (50 mg total) by mouth every 6 (six) hours as needed for moderate pain. 30 tablet 0   No current facility-administered medications for this encounter.     Allergies:   Patient has no known allergies.   Social History:  The patient  reports that he has quit smoking. He uses smokeless tobacco. He reports that he drinks alcohol. He reports that he does not use drugs.   Family History:  The patient's  family history includes Diabetes in his mother.   Father and paternal aunts/uncles have "irregular heart beat".     ROS:  Please see the history of present illness.   All other systems are reviewed and negative.    PHYSICAL EXAM: VS:  BP 114/72 (BP Location: Left Arm, Patient Position: Sitting, Cuff Size: Large)   Pulse 71   Ht 6' (1.829 m)   Wt 296 lb (134.3 kg)  BMI 40.14 kg/m  , BMI Body mass index is 40.14 kg/m. GEN: overweight, in no acute distress  HEENT: normal  Neck: no JVD, carotid bruits, or masses Cardiac: RRR; no murmurs, rubs, or gallops,no edema  Respiratory:  clear to auscultation bilaterally, normal work of breathing GI: soft, nontender, nondistended, + BS MS: no deformity or atrophy  Skin: warm and dry  Neuro:  Strength and sensation are intact Psych: euthymic mood, full affect  EKG:  EKG is ordered today. The ekg ordered today shows sinus rhythm 71 bpm, PR 150 msec, QRS 90 msec, Qtc 436 msec, nonspecific St/T changes   Recent Labs: 01/18/2016: Magnesium 1.8 01/19/2016: B Natriuretic Peptide 146.9 03/04/2016: TSH 2.70 03/05/2016: ALT  19 03/06/2016: Hemoglobin 12.7; Platelets 267 03/31/2016: Potassium 4.6; Sodium 142 07/15/2016: BUN 17; Creatinine, Ser 0.80    Lipid Panel     Component Value Date/Time   CHOL 110 03/05/2016 0639   TRIG 85 03/05/2016 0639   HDL 30 (L) 03/05/2016 0639   CHOLHDL 3.7 03/05/2016 0639   VLDL 17 03/05/2016 0639   LDLCALC 63 03/05/2016 0639     Wt Readings from Last 3 Encounters:  10/08/16 296 lb (134.3 kg)  08/10/16 292 lb 12.8 oz (132.8 kg)  07/30/16 295 lb (133.8 kg)    Other studies Reviewed: Additional studies/ records that were reviewed today include: Dr Koren Bound notes, prior echo, ecgs, ETT, cardiac MRI Review of the above records today demonstrates: as above   ASSESSMENT AND PLAN:  1.  Afib/ atrial flutter The patient has symptomatic atrial arrhythmias He has failed medical therapy with amiodarone.  He is anticoagualted with eliquis.  He is doing much better with flecainide but continues to have afib symptoms Lifestyle modification was again discussed today.  He has lost 10 lbs since I last saw him but admits that it is difficult keeping weight off. Therapeutic strategies for afib/ atrial flutter including medicine and ablation were discussed in detail with the patient today. Risk, benefits, and alternatives to EP study and radiofrequency ablation were also discussed in detail today.  He remains clear that he would like to avoid ablation for now.  He may reconsider if his afib increases in frequency.  I have advised that he strongly consider proceeding before his afib becomes more persistent.  2. Morbid obesity Body mass index is 40.14 kg/m. Weight loss advised   Follow-up:  With Dr Nathaniel [name] in 3 months unless he decides to proceed with ablation  Current medicines are reviewed at length with the patient today.   The patient does not have concerns regarding his medicines.  The following changes were made today:  None   Signed, Nathaniel Range, MD  10/08/2016 4:26 PM     [name] [address] [place] [address] [phone] (office) [phone] (fax)

## 2016-10-08 NOTE — Patient Instructions (Signed)
Your physician wants you to follow-up in: 3 months with Dr. Klein. You will receive a reminder letter in the mail two months in advance. If you don't receive a letter, please call our office to schedule the follow-up appointment.  

## 2016-11-10 ENCOUNTER — Encounter: Payer: Self-pay | Admitting: Internal Medicine

## 2016-11-10 ENCOUNTER — Ambulatory Visit (INDEPENDENT_AMBULATORY_CARE_PROVIDER_SITE_OTHER): Payer: BLUE CROSS/BLUE SHIELD | Admitting: Internal Medicine

## 2016-11-10 VITALS — BP 128/70 | HR 80 | Ht 72.0 in | Wt 295.1 lb

## 2016-11-10 DIAGNOSIS — I484 Atypical atrial flutter: Secondary | ICD-10-CM | POA: Diagnosis not present

## 2016-11-10 DIAGNOSIS — I4891 Unspecified atrial fibrillation: Secondary | ICD-10-CM

## 2016-11-10 NOTE — Patient Instructions (Signed)
Medication Instructions:  Your physician recommends that you continue on your current medications as directed. Please refer to the Current Medication list given to you today.   Labwork: Your physician recommends that you return for lab work on 12/02/16---you do not have to fast   Testing/Procedures: Your physician has requested that you have a TEE. During a TEE, sound waves are used to create images of your heart. It provides your doctor with information about the size and shape of your heart and how well your heart's chambers and valves are working. In this test, a transducer is attached to the end of a flexible tube that's guided down your throat and into your esophagus (the tube leading from you mouth to your stomach) to get a more detailed image of your heart. You are not awake for the procedure. Please see the instruction sheet given to you today. For further information please visit [email or web address]  Please arrive at [facility] at 7:30am Do not eat or drink after midnight the night prior to the procedure Okay to take your medications the morning of the procedure with a small sip of water Will need someone to drive you home after the procedure   Your physician has recommended that you have an ablation. Catheter ablation is a medical procedure used to treat some cardiac arrhythmias (irregular heartbeats). During catheter ablation, a long, thin, flexible tube is put into a blood vessel in your groin (upper thigh), or neck. This tube is called an ablation catheter. It is then guided to your heart through the blood vessel. Radio frequency waves destroy small areas of heart tissue where abnormal heartbeats may cause an arrhythmia to start. Please see the instruction sheet given to you today.---12/09/16   Please arrive at [facility] at 5:30am Do not eat or drink after midnight the night prior to the procedure Do not  take any medications the morning of the procedure Plan for one night stay Will need someone to drive you home after discharge     Follow-Up: Your physician recommends that you schedule a follow-up appointment in: 4 weeks from 12/09/16 with Rudi Coco, NP and 3 months from 12/09/16 with Dr Johney Frame   Any Other Special Instructions Will Be Listed Below (If Applicable).     If you need a refill on your cardiac medications before your next appointment, please call your pharmacy.

## 2016-11-10 NOTE — Progress Notes (Signed)
 Clinic Note  Date:  11/10/2016   ID:  Nathaniel [name], DOB 02/08/1991, MRN [id number] Cardiologist:  none Primary Electrophysiologist: Dr Klein  CC: afib   History of Present Illness: Nathaniel [name] is a 25 y.o. male who presents today for afib evaluation.  He has failed medical therapy with flecainide.  He feels that he has afib several times per day lasting up to 30 minutes.    Today, he denies symptoms of  chest pain, shortness of breath, orthopnea, PND, lower extremity edema, claudication, dizziness, presyncope, syncope, bleeding, or neurologic sequela. The patient is tolerating medications without difficulties and is otherwise without complaint today.    Past Medical History:  Diagnosis Date  . Atrial flutter (HCC) 01/17/2016   Admx 5/17 with AFlutter with RVR in setting of pancreatitis // CHADS2-VASc=1 (CHF) // Anticoag not started // Amiodarone >> NSR   . Chronic systolic CHF (congestive heart failure) (HCC)    Echo 01/17/16 - EF 35-40%, diffuse HK, mild LAE   . 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HCC) 02/03/2016   a. EF 35-40% in setting of AFlutter with RVR in 5/17 // probably tachycardia mediated // needs FU echo 2-3 mos after NSR restored // b. Limited Echo 7/17: EF 45-50%, diff HK, mild LAE  . Fatty liver 12/2015   per CT  . Obesity    BMI 45 12/2015  . Pancreatitis 2012   recurrent acute. Admissions to [facility], 12/2015 admissin to [facility] hospital, ct then shows pseudocyst.    Past Surgical History:  Procedure Laterality Date  . HAND SURGERY       Current Outpatient Prescriptions  Medication Sig Dispense Refill  . apixaban (ELIQUIS) 5 MG TABS tablet Take 1 tablet (5 mg total) by mouth 2 (two) times daily. 60 tablet 11  . Cholecalciferol (VITAMIN D PO) Take 1 tablet by mouth daily.    . flecainide (TAMBOCOR) 100 MG tablet Take 1 tablet (100 mg total) by mouth 2 (two) times daily. 60 tablet 3  . ibuprofen (ADVIL,MOTRIN) 200 MG tablet Take 400 mg by mouth every 6 (six) hours as  needed for mild pain.    . lisinopril (PRINIVIL,ZESTRIL) 5 MG tablet Take 1 tablet (5 mg total) by mouth daily. 30 tablet 10  . metoprolol succinate (TOPROL-XL) 25 MG 24 hr tablet Take 1 tablet (25 mg total) by mouth daily. 90 tablet 3  . Omega-3 Fatty Acids (FISH OIL PO) Take 1 capsule by mouth daily.    . traMADol (ULTRAM) 50 MG tablet Take 1 tablet (50 mg total) by mouth every 6 (six) hours as needed for moderate pain. 30 tablet 0   No current facility-administered medications for this visit.     Allergies:   Patient has no known allergies.   Social History:  The patient  reports that he has quit smoking. He uses smokeless tobacco. He reports that he drinks alcohol. He reports that he does not use drugs.   Family History:  The patient's  family history includes Diabetes in his mother.   Father and paternal aunts/uncles have "irregular heart beat".     ROS:  Please see the history of present illness.   All other systems are reviewed and negative.    PHYSICAL EXAM: VS:  Ht 6' (1.829 m)   Wt 295 lb 2 oz (133.9 kg)   BMI 40.03 kg/m  , BMI Body mass index is 40.03 kg/m. GEN: overweight, in no acute distress  HEENT: normal  Neck: no JVD, carotid bruits, or masses Cardiac:   RRR; no murmurs, rubs, or gallops,no edema  Respiratory:  clear to auscultation bilaterally, normal work of breathing GI: soft, nontender, nondistended, + BS MS: no deformity or atrophy  Skin: warm and dry  Neuro:  Strength and sensation are intact Psych: euthymic mood, full affect  EKG:  EKG is ordered today. The ekg ordered today shows sinus rhythm 82 bpm, PR 146 msec, QRS 78 msec, Qtc 425 msec, nonspecific St/T changes   Recent Labs: 01/18/2016: Magnesium 1.8 01/19/2016: B Natriuretic Peptide 146.9 03/04/2016: TSH 2.70 03/05/2016: ALT 19 03/06/2016: Hemoglobin 12.7; Platelets 267 03/31/2016: Potassium 4.6; Sodium 142 07/15/2016: BUN 17; Creatinine, Ser 0.80    Lipid Panel     Component Value Date/Time    CHOL 110 03/05/2016 0639   TRIG 85 03/05/2016 0639   HDL 30 (L) 03/05/2016 0639   CHOLHDL 3.7 03/05/2016 0639   VLDL 17 03/05/2016 0639   LDLCALC 63 03/05/2016 0639     Wt Readings from Last 3 Encounters:  11/10/16 295 lb 2 oz (133.9 kg)  10/08/16 296 lb (134.3 kg)  08/10/16 292 lb 12.8 oz (132.8 kg)    Other studies Reviewed: Additional studies/ records that were reviewed today include: Dr Kleins notes, prior echo, cardiac MRI again reviewed Review of the above records today demonstrates: as above   ASSESSMENT AND PLAN:  1.  Afib/ atrial flutter The patient has symptomatic atrial arrhythmias He has failed medical therapy with amiodarone and flecainide.  He is anticoagualted with eliquis.   Lifestyle modification was again discussed today.  He is not making much progress with this.  Therapeutic strategies for afib/ atrial flutter including medicine and ablation were discussed in detail with the patient today. Risk, benefits, and alternatives to EP study and radiofrequency ablation were also discussed in detail today. These risks include but are not limited to stroke, bleeding, vascular damage, tamponade, perforation, damage to the esophagus, lungs, and other structures, pulmonary vein stenosis, worsening renal function, and death. The patient understands these risk and wishes to proceed.  We will therefore proceed with catheter ablation at the next available time (he will coordinate based on his scheduled at [facility]).  TEE prior to ablation to exclude LAA thrombus.  2. Morbid obesity Body mass index is 40.03 kg/m. Weight loss advised    Current medicines are reviewed at length with the patient today.   The patient does not have concerns regarding his medicines.  The following changes were made today:  None   Signed, Regena Delucchi, MD  11/10/2016 2:13 PM     [facility] [address] [place] [address] [phone] (office) [phone] (fax) 

## 2016-11-29 ENCOUNTER — Telehealth: Payer: Self-pay | Admitting: Internal Medicine

## 2016-11-29 NOTE — Telephone Encounter (Signed)
LVM For Patient FMLA ready for pick up.

## 2016-11-30 ENCOUNTER — Other Ambulatory Visit: Payer: BLUE CROSS/BLUE SHIELD | Admitting: *Deleted

## 2016-11-30 DIAGNOSIS — I483 Typical atrial flutter: Secondary | ICD-10-CM

## 2016-12-01 LAB — CBC WITH DIFFERENTIAL/PLATELET
Basophils Absolute: 0 10*3/uL (ref 0.0–0.2)
Basos: 1 %
EOS (ABSOLUTE): 0.2 10*3/uL (ref 0.0–0.4)
Eos: 3 %
Hematocrit: 38.7 % (ref 37.5–51.0)
Hemoglobin: 13.2 g/dL (ref 13.0–17.7)
Immature Grans (Abs): 0 10*3/uL (ref 0.0–0.1)
Immature Granulocytes: 0 %
Lymphocytes Absolute: 2.6 10*3/uL (ref 0.7–3.1)
Lymphs: 38 %
MCH: 29.6 pg (ref 26.6–33.0)
MCHC: 34.1 g/dL (ref 31.5–35.7)
MCV: 87 fL (ref 79–97)
Monocytes Absolute: 0.7 10*3/uL (ref 0.1–0.9)
Monocytes: 10 %
Neutrophils Absolute: 3.4 10*3/uL (ref 1.4–7.0)
Neutrophils: 48 %
Platelets: 293 10*3/uL (ref 150–379)
RBC: 4.46 x10E6/uL (ref 4.14–5.80)
RDW: 13 % (ref 12.3–15.4)
WBC: 6.9 10*3/uL (ref 3.4–10.8)

## 2016-12-01 LAB — BASIC METABOLIC PANEL
BUN/Creatinine Ratio: 21 — ABNORMAL HIGH (ref 9–20)
BUN: 13 mg/dL (ref 6–20)
CO2: 20 mmol/L (ref 18–29)
Calcium: 8.4 mg/dL — ABNORMAL LOW (ref 8.7–10.2)
Chloride: 106 mmol/L (ref 96–106)
Creatinine, Ser: 0.61 mg/dL — ABNORMAL LOW (ref 0.76–1.27)
GFR calc Af Amer: 159 mL/min/{1.73_m2} (ref >59)
GFR calc non Af Amer: 138 mL/min/{1.73_m2} (ref >59)
Glucose: 95 mg/dL (ref 65–99)
Potassium: 3.9 mmol/L (ref 3.5–5.2)
Sodium: 142 mmol/L (ref 134–144)

## 2016-12-02 ENCOUNTER — Telehealth: Payer: Self-pay

## 2016-12-02 NOTE — Telephone Encounter (Signed)
Patient calling about lab results. Patient made aware that his labs from 11/30/16 are stable pre-procedure labs. Patient verbalized understanding.

## 2016-12-08 ENCOUNTER — Encounter (HOSPITAL_COMMUNITY): Payer: Self-pay

## 2016-12-08 ENCOUNTER — Encounter (HOSPITAL_COMMUNITY): Payer: Self-pay | Admitting: Anesthesiology

## 2016-12-08 ENCOUNTER — Ambulatory Visit (HOSPITAL_BASED_OUTPATIENT_CLINIC_OR_DEPARTMENT_OTHER)
Admission: RE | Admit: 2016-12-08 | Discharge: 2016-12-08 | Disposition: A | Discharge status: Home / Self Care | Payer: BLUE CROSS/BLUE SHIELD | Source: Ambulatory Visit | Attending: Nurse Practitioner | Admitting: Nurse Practitioner

## 2016-12-08 ENCOUNTER — Ambulatory Visit (HOSPITAL_COMMUNITY)
Admission: RE | Admit: 2016-12-08 | Discharge: 2016-12-08 | Disposition: A | Discharge status: Home / Self Care | Payer: BLUE CROSS/BLUE SHIELD | Source: Ambulatory Visit | Attending: Cardiology | Admitting: Cardiology

## 2016-12-08 ENCOUNTER — Encounter (HOSPITAL_COMMUNITY)
Admission: RE | Disposition: A | Discharge status: Home / Self Care | Payer: Self-pay | Source: Ambulatory Visit | Attending: Cardiology

## 2016-12-08 DIAGNOSIS — I5022 Chronic systolic (congestive) heart failure: Secondary | ICD-10-CM | POA: Insufficient documentation

## 2016-12-08 DIAGNOSIS — I4891 Unspecified atrial fibrillation: Secondary | ICD-10-CM | POA: Diagnosis present

## 2016-12-08 DIAGNOSIS — Z6841 Body Mass Index (BMI) 40.0 and over, adult: Secondary | ICD-10-CM | POA: Diagnosis not present

## 2016-12-08 DIAGNOSIS — F17290 Nicotine dependence, other tobacco product, uncomplicated: Secondary | ICD-10-CM | POA: Insufficient documentation

## 2016-12-08 DIAGNOSIS — Z7901 Long term (current) use of anticoagulants: Secondary | ICD-10-CM | POA: Insufficient documentation

## 2016-12-08 DIAGNOSIS — Z79899 Other long term (current) drug therapy: Secondary | ICD-10-CM | POA: Insufficient documentation

## 2016-12-08 DIAGNOSIS — I420 Dilated cardiomyopathy: Secondary | ICD-10-CM | POA: Insufficient documentation

## 2016-12-08 HISTORY — PX: TEE WITHOUT CARDIOVERSION: SHX5443

## 2016-12-08 SURGERY — ECHOCARDIOGRAM, TRANSESOPHAGEAL
Anesthesia: Moderate Sedation

## 2016-12-08 MED ORDER — MIDAZOLAM HCL 10 MG/2ML IJ SOLN
INTRAMUSCULAR | Status: DC | PRN
  Administered 2016-12-08 (×2): 2 mg via INTRAVENOUS
  Administered 2016-12-08: 1 mg via INTRAVENOUS

## 2016-12-08 MED ORDER — DIPHENHYDRAMINE HCL 50 MG/ML IJ SOLN
INTRAMUSCULAR | Status: AC
  Filled 2016-12-08: qty 1

## 2016-12-08 MED ORDER — MIDAZOLAM HCL 5 MG/ML IJ SOLN
INTRAMUSCULAR | Status: AC
  Filled 2016-12-08: qty 2

## 2016-12-08 MED ORDER — FENTANYL CITRATE (PF) 100 MCG/2ML IJ SOLN
INTRAMUSCULAR | Status: DC | PRN
  Administered 2016-12-08: 50 ug via INTRAVENOUS
  Administered 2016-12-08: 25 ug via INTRAVENOUS

## 2016-12-08 MED ORDER — SODIUM CHLORIDE 0.9 % IV SOLN
INTRAVENOUS | Status: DC
  Administered 2016-12-08: 500 mL via INTRAVENOUS

## 2016-12-08 MED ORDER — FENTANYL CITRATE (PF) 100 MCG/2ML IJ SOLN
INTRAMUSCULAR | Status: AC
  Filled 2016-12-08: qty 2

## 2016-12-08 NOTE — CV Procedure (Signed)
   TEE  Indication: AFIB  Time out performed  During this procedure the patient is administered a total of Versed 5 mg and Fentanyl 75 mcg to achieve and maintain moderate conscious sedation.  The patient's heart rate, blood pressure, and oxygen saturation are monitored continuously during the procedure. The period of conscious sedation is 25 minutes, of which I was present face-to-face 100% of this time.  Findings:   - No LA or LAA thrombus  - Low normal EF 50%  - Mildly dilated LV and mild to moderately dilated RV  - Trace TR  - Negative bubble study  Proceeding with ablation.  Donato Schultz, MD

## 2016-12-08 NOTE — H&P (Signed)
ID:  Nathaniel [name], DOB 08-08-1991, MRN [id number] Cardiologist:  none Primary Electrophysiologist: Dr Graciela Husbands  CC: afib   History of Present Illness: Nathaniel [name] is a 26 y.o. male who presents today for afib evaluation.  He has failed medical therapy with flecainide.  He feels that he has afib several times per day lasting up to 30 minutes.    Today, he denies symptoms of  chest pain, shortness of breath, orthopnea, PND, lower extremity edema, claudication, dizziness, presyncope, syncope, bleeding, or neurologic sequela. The patient is tolerating medications without difficulties and is otherwise without complaint today.        Past Medical History:  Diagnosis Date  . Atrial flutter (HCC) 01/17/2016   Admx 5/17 with AFlutter with RVR in setting of pancreatitis // CHADS2-VASc=1 (CHF) // Anticoag not started // Amiodarone >> NSR   . Chronic systolic CHF (congestive heart failure) (HCC)    Echo 01/17/16 - EF 35-40%, diffuse HK, mild LAE   . 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HCC) 02/03/2016   a. EF 35-40% in setting of AFlutter with RVR in 5/17 // probably tachycardia mediated // needs FU echo 2-3 mos after NSR restored // b. Limited Echo 7/17: EF 45-50%, diff HK, mild LAE  . Fatty liver 12/2015   per CT  . Obesity    BMI 45 12/2015  . Pancreatitis 2012   recurrent acute. Admissions to [facility], 12/2015 admissin to [facility] hospital, ct then shows pseudocyst.         Past Surgical History:  Procedure Laterality Date  . HAND SURGERY             Current Outpatient Prescriptions  Medication Sig Dispense Refill  . apixaban (ELIQUIS) 5 MG TABS tablet Take 1 tablet (5 mg total) by mouth 2 (two) times daily. 60 tablet 11  . Cholecalciferol (VITAMIN D PO) Take 1 tablet by mouth daily.    . flecainide (TAMBOCOR) 100 MG tablet Take 1 tablet (100 mg total) by mouth 2 (two) times daily. 60 tablet 3  . ibuprofen (ADVIL,MOTRIN) 200 MG tablet Take 400 mg by mouth every 6 (six) hours as  needed for mild pain.    [name] lisinopril (PRINIVIL,ZESTRIL) 5 MG tablet Take 1 tablet (5 mg total) by mouth daily. 30 tablet 10  . metoprolol succinate (TOPROL-XL) 25 MG 24 hr tablet Take 1 tablet (25 mg total) by mouth daily. 90 tablet 3  . Omega-3 Fatty Acids (FISH OIL PO) Take 1 capsule by mouth daily.    . traMADol (ULTRAM) 50 MG tablet Take 1 tablet (50 mg total) by mouth every 6 (six) hours as needed for moderate pain. 30 tablet 0   No current facility-administered medications for this visit.     Allergies:   Patient has no known allergies.   Social History:  The patient  reports that he has quit smoking. He uses smokeless tobacco. He reports that he drinks alcohol. He reports that he does not use drugs.   Family History:  The patient's  family history includes Diabetes in his mother.   Father and paternal aunts/uncles have "irregular heart beat".     ROS:  Please see the history of present illness.   All other systems are reviewed and negative.    PHYSICAL EXAM: VS:  Ht 6' (1.829 m)   Wt 295 lb 2 oz (133.9 kg)   BMI 40.03 kg/m  , BMI Body mass index is 40.03 kg/m. GEN: overweight, in no acute distress  HEENT: normal  Neck: no  JVD, carotid bruits, or masses Cardiac: RRR; no murmurs, rubs, or gallops,no edema  Respiratory:  clear to auscultation bilaterally, normal work of breathing GI: soft, nontender, nondistended, + BS MS: no deformity or atrophy  Skin: warm and dry  Neuro:  Strength and sensation are intact Psych: euthymic mood, full affect  EKG:  EKG is ordered today. The ekg ordered today shows sinus rhythm 82 bpm, PR 146 msec, QRS 78 msec, Qtc 425 msec, nonspecific St/T changes   Recent Labs: 01/18/2016: Magnesium 1.8 01/19/2016: B Natriuretic Peptide 146.9 03/04/2016: TSH 2.70 03/05/2016: ALT 19 03/06/2016: Hemoglobin 12.7; Platelets 267 03/31/2016: Potassium 4.6; Sodium 142 07/15/2016: BUN 17; Creatinine, Ser 0.80    Lipid Panel  Labs(Brief)           Component Value Date/Time   CHOL 110 03/05/2016 0639   TRIG 85 03/05/2016 0639   HDL 30 (L) 03/05/2016 0639   CHOLHDL 3.7 03/05/2016 0639   VLDL 17 03/05/2016 0639   LDLCALC 63 03/05/2016 0639          Wt Readings from Last 3 Encounters:  11/10/16 295 lb 2 oz (133.9 kg)  10/08/16 296 lb (134.3 kg)  08/10/16 292 lb 12.8 oz (132.8 kg)    Other studies Reviewed: Additional studies/ records that were reviewed today include: Dr Koren Bound notes, prior echo, cardiac MRI again reviewed Review of the above records today demonstrates: as above   ASSESSMENT AND PLAN:  1.  Afib/ atrial flutter The patient has symptomatic atrial arrhythmias He has failed medical therapy with amiodarone and flecainide.  He is anticoagualted with eliquis.   Lifestyle modification was again discussed today.  He is not making much progress with this.  Therapeutic strategies for afib/ atrial flutter including medicine and ablation were discussed in detail with the patient today. Risk, benefits, and alternatives to EP study and radiofrequency ablation were also discussed in detail today. These risks include but are not limited to stroke, bleeding, vascular damage, tamponade, perforation, damage to the esophagus, lungs, and other structures, pulmonary vein stenosis, worsening renal function, and death. The patient understands these risk and wishes to proceed.  We will therefore proceed with catheter ablation at the next available time (he will coordinate based on his scheduled at [facility]).  TEE prior to ablation to exclude LAA thrombus.  2. Morbid obesity Body mass index is 40.03 kg/m. Weight loss advised    Current medicines are reviewed at length with the patient today.   The patient does not have concerns regarding his medicines.  The following changes were made today:  None   Signed, Hillis Range, MD  11/10/2016 2:13 PM     [facility] [address] [place]  [place] [address] [phone] (office) [phone] (fax)   Personally seen and examined. Agree with above. TEE for afib prior to ablation. No change in exam.  Donato Schultz, MD

## 2016-12-08 NOTE — Progress Notes (Signed)
  Echocardiogram Echocardiogram Transesophageal has been performed.  Tye Savoy 12/08/2016, 10:45 AM

## 2016-12-08 NOTE — Discharge Instructions (Signed)

## 2016-12-09 ENCOUNTER — Ambulatory Visit (HOSPITAL_COMMUNITY)
Admission: RE | Admit: 2016-12-09 | Discharge: 2016-12-10 | Disposition: A | Discharge status: Home / Self Care | Payer: BLUE CROSS/BLUE SHIELD | Source: Ambulatory Visit | Attending: Internal Medicine | Admitting: Internal Medicine

## 2016-12-09 ENCOUNTER — Encounter (HOSPITAL_COMMUNITY)
Admission: RE | Disposition: A | Discharge status: Home / Self Care | Payer: Self-pay | Source: Ambulatory Visit | Attending: Internal Medicine

## 2016-12-09 ENCOUNTER — Ambulatory Visit (HOSPITAL_COMMUNITY): Payer: BLUE CROSS/BLUE SHIELD | Admitting: Anesthesiology

## 2016-12-09 ENCOUNTER — Encounter (HOSPITAL_COMMUNITY): Payer: Self-pay | Admitting: Internal Medicine

## 2016-12-09 DIAGNOSIS — I4891 Unspecified atrial fibrillation: Secondary | ICD-10-CM | POA: Diagnosis present

## 2016-12-09 DIAGNOSIS — I4892 Unspecified atrial flutter: Secondary | ICD-10-CM | POA: Diagnosis not present

## 2016-12-09 DIAGNOSIS — Z6841 Body Mass Index (BMI) 40.0 and over, adult: Secondary | ICD-10-CM | POA: Insufficient documentation

## 2016-12-09 DIAGNOSIS — I480 Paroxysmal atrial fibrillation: Secondary | ICD-10-CM | POA: Insufficient documentation

## 2016-12-09 DIAGNOSIS — Z7901 Long term (current) use of anticoagulants: Secondary | ICD-10-CM | POA: Diagnosis not present

## 2016-12-09 DIAGNOSIS — I5022 Chronic systolic (congestive) heart failure: Secondary | ICD-10-CM | POA: Diagnosis not present

## 2016-12-09 DIAGNOSIS — I420 Dilated cardiomyopathy: Secondary | ICD-10-CM | POA: Insufficient documentation

## 2016-12-09 DIAGNOSIS — Z720 Tobacco use: Secondary | ICD-10-CM | POA: Diagnosis not present

## 2016-12-09 DIAGNOSIS — I483 Typical atrial flutter: Secondary | ICD-10-CM

## 2016-12-09 DIAGNOSIS — E785 Hyperlipidemia, unspecified: Secondary | ICD-10-CM | POA: Diagnosis not present

## 2016-12-09 HISTORY — DX: Other chronic pain: G89.29

## 2016-12-09 HISTORY — DX: Low back pain: M54.5

## 2016-12-09 HISTORY — DX: Major depressive disorder, single episode, unspecified: F32.9

## 2016-12-09 HISTORY — PX: ATRIAL FIBRILLATION ABLATION: EP1191

## 2016-12-09 HISTORY — DX: Headache: R51

## 2016-12-09 HISTORY — DX: Paroxysmal atrial fibrillation: I48.0

## 2016-12-09 HISTORY — DX: Headache, unspecified: R51.9

## 2016-12-09 HISTORY — DX: Low back pain, unspecified: M54.50

## 2016-12-09 HISTORY — DX: Depression, unspecified: F32.A

## 2016-12-09 LAB — POCT ACTIVATED CLOTTING TIME
Activated Clotting Time: 219 seconds
Activated Clotting Time: 241 seconds
Activated Clotting Time: 279 seconds
Activated Clotting Time: 181 seconds

## 2016-12-09 SURGERY — ATRIAL FIBRILLATION ABLATION
Anesthesia: General

## 2016-12-09 MED ORDER — LACTATED RINGERS IV SOLN
INTRAVENOUS | Status: DC | PRN
  Administered 2016-12-09: 10:00:00 via INTRAVENOUS

## 2016-12-09 MED ORDER — OFF THE BEAT BOOK
Freq: Once | Status: AC
  Administered 2016-12-09: 21:00:00
  Filled 2016-12-09: qty 1

## 2016-12-09 MED ORDER — SODIUM CHLORIDE 0.9% FLUSH
3.0000 mL | Freq: Two times a day (BID) | INTRAVENOUS | Status: DC
  Administered 2016-12-09: 3 mL via INTRAVENOUS

## 2016-12-09 MED ORDER — DEXTROSE 5 % IV SOLN
INTRAVENOUS | Status: DC | PRN
  Administered 2016-12-09: 20 ug/min via INTRAVENOUS

## 2016-12-09 MED ORDER — SODIUM CHLORIDE 0.9% FLUSH
3.0000 mL | INTRAVENOUS | Status: DC | PRN
  Administered 2016-12-09: 21:00:00 3 mL via INTRAVENOUS
  Filled 2016-12-09: qty 3

## 2016-12-09 MED ORDER — LIDOCAINE HCL (CARDIAC) 20 MG/ML IV SOLN
INTRAVENOUS | Status: DC | PRN
  Administered 2016-12-09: 60 mg via INTRAVENOUS

## 2016-12-09 MED ORDER — HEPARIN SODIUM (PORCINE) 1000 UNIT/ML IJ SOLN
INTRAMUSCULAR | Status: AC
  Filled 2016-12-09: qty 1

## 2016-12-09 MED ORDER — PHENYLEPHRINE HCL 10 MG/ML IJ SOLN
INTRAMUSCULAR | Status: DC | PRN
  Administered 2016-12-09: 80 ug via INTRAVENOUS
  Administered 2016-12-09: 40 ug via INTRAVENOUS
  Administered 2016-12-09: 80 ug via INTRAVENOUS
  Administered 2016-12-09: 40 ug via INTRAVENOUS

## 2016-12-09 MED ORDER — ONDANSETRON HCL 4 MG/2ML IJ SOLN
INTRAMUSCULAR | Status: DC | PRN
  Administered 2016-12-09: 4 mg via INTRAVENOUS

## 2016-12-09 MED ORDER — BUPIVACAINE HCL (PF) 0.25 % IJ SOLN
INTRAMUSCULAR | Status: AC
  Filled 2016-12-09: qty 30

## 2016-12-09 MED ORDER — APIXABAN 5 MG PO TABS
5.0000 mg | ORAL_TABLET | Freq: Two times a day (BID) | ORAL | Status: DC
  Administered 2016-12-09 – 2016-12-10 (×2): 5 mg via ORAL
  Filled 2016-12-09 (×2): qty 1

## 2016-12-09 MED ORDER — LISINOPRIL 5 MG PO TABS
5.0000 mg | ORAL_TABLET | Freq: Every day | ORAL | Status: DC
  Administered 2016-12-09 – 2016-12-10 (×2): 5 mg via ORAL
  Filled 2016-12-09 (×2): qty 1

## 2016-12-09 MED ORDER — MIDAZOLAM HCL 5 MG/5ML IJ SOLN
INTRAMUSCULAR | Status: DC | PRN
  Administered 2016-12-09: 2 mg via INTRAVENOUS

## 2016-12-09 MED ORDER — IOPAMIDOL (ISOVUE-370) INJECTION 76%
INTRAVENOUS | Status: DC | PRN
  Administered 2016-12-09: 5 mL via INTRAVENOUS

## 2016-12-09 MED ORDER — HEPARIN SODIUM (PORCINE) 1000 UNIT/ML IJ SOLN
INTRAMUSCULAR | Status: DC | PRN
  Administered 2016-12-09: 12000 [IU] via INTRAVENOUS
  Administered 2016-12-09 (×2): 1000 [IU] via INTRAVENOUS

## 2016-12-09 MED ORDER — PROPOFOL 10 MG/ML IV BOLUS
INTRAVENOUS | Status: DC | PRN
  Administered 2016-12-09: 200 mg via INTRAVENOUS

## 2016-12-09 MED ORDER — ISOPROTERENOL HCL 0.2 MG/ML IJ SOLN
INTRAMUSCULAR | Status: AC
  Filled 2016-12-09: qty 5

## 2016-12-09 MED ORDER — FENTANYL CITRATE (PF) 100 MCG/2ML IJ SOLN: 25.0000 ug | INTRAMUSCULAR | Status: DC | PRN

## 2016-12-09 MED ORDER — EPHEDRINE SULFATE 50 MG/ML IJ SOLN
INTRAMUSCULAR | Status: DC | PRN
  Administered 2016-12-09: 5 mg via INTRAVENOUS

## 2016-12-09 MED ORDER — METOPROLOL SUCCINATE ER 25 MG PO TB24: 25.0000 mg | ORAL_TABLET | Freq: Every day | ORAL | Status: DC

## 2016-12-09 MED ORDER — LISINOPRIL 5 MG PO TABS: 5.0000 mg | ORAL_TABLET | Freq: Every day | ORAL | Status: DC

## 2016-12-09 MED ORDER — PROTAMINE SULFATE 10 MG/ML IV SOLN
INTRAVENOUS | Status: DC | PRN
  Administered 2016-12-09: 30 mg via INTRAVENOUS

## 2016-12-09 MED ORDER — BUPIVACAINE HCL (PF) 0.25 % IJ SOLN
INTRAMUSCULAR | Status: DC | PRN
  Administered 2016-12-09: 30 mL

## 2016-12-09 MED ORDER — SODIUM CHLORIDE 0.9 % IV SOLN
INTRAVENOUS | Status: DC
  Administered 2016-12-09 (×2): via INTRAVENOUS

## 2016-12-09 MED ORDER — HYDROCODONE-ACETAMINOPHEN 5-325 MG PO TABS
ORAL_TABLET | ORAL | Status: AC
  Filled 2016-12-09: qty 2

## 2016-12-09 MED ORDER — HYDROCODONE-ACETAMINOPHEN 5-325 MG PO TABS
1.0000 | ORAL_TABLET | ORAL | Status: DC | PRN
  Administered 2016-12-09 – 2016-12-10 (×3): 2 via ORAL
  Filled 2016-12-09 (×2): qty 2

## 2016-12-09 MED ORDER — NICOTINE 14 MG/24HR TD PT24
14.0000 mg | MEDICATED_PATCH | Freq: Every day | TRANSDERMAL | Status: DC
  Administered 2016-12-09 – 2016-12-10 (×2): 14 mg via TRANSDERMAL
  Filled 2016-12-09 (×2): qty 1

## 2016-12-09 MED ORDER — METOPROLOL SUCCINATE ER 25 MG PO TB24
25.0000 mg | ORAL_TABLET | Freq: Every day | ORAL | Status: DC
  Administered 2016-12-09 – 2016-12-10 (×2): 25 mg via ORAL
  Filled 2016-12-09 (×2): qty 1

## 2016-12-09 MED ORDER — ONDANSETRON HCL 4 MG/2ML IJ SOLN
4.0000 mg | Freq: Four times a day (QID) | INTRAMUSCULAR | Status: DC | PRN
  Administered 2016-12-09: 4 mg via INTRAVENOUS
  Filled 2016-12-09: qty 2

## 2016-12-09 MED ORDER — FENTANYL CITRATE (PF) 100 MCG/2ML IJ SOLN
INTRAMUSCULAR | Status: DC | PRN
  Administered 2016-12-09: 100 ug via INTRAVENOUS
  Administered 2016-12-09 (×5): 50 ug via INTRAVENOUS

## 2016-12-09 MED ORDER — HEPARIN SODIUM (PORCINE) 1000 UNIT/ML IJ SOLN
INTRAMUSCULAR | Status: DC | PRN
  Administered 2016-12-09: 8000 [IU] via INTRAVENOUS
  Administered 2016-12-09: 3000 [IU] via INTRAVENOUS
  Administered 2016-12-09: 5000 [IU] via INTRAVENOUS

## 2016-12-09 MED ORDER — SODIUM CHLORIDE 0.9 % IV SOLN: 250.0000 mL | INTRAVENOUS | Status: DC | PRN

## 2016-12-09 MED ORDER — LIDOCAINE 1% INJECTION FOR CIRCUMCISION
0.5000 mL | INJECTION | Freq: Once | INTRAVENOUS | Status: DC
  Filled 2016-12-09: qty 1

## 2016-12-09 MED ORDER — IOPAMIDOL (ISOVUE-370) INJECTION 76%
INTRAVENOUS | Status: AC
  Filled 2016-12-09: qty 50

## 2016-12-09 MED ORDER — ACETAMINOPHEN 325 MG PO TABS
650.0000 mg | ORAL_TABLET | ORAL | Status: DC | PRN
  Administered 2016-12-09: 21:00:00 650 mg via ORAL
  Filled 2016-12-09: qty 2

## 2016-12-09 SURGICAL SUPPLY — 19 items
BAG SNAP BAND KOVER 36X36 (MISCELLANEOUS) ×2 IMPLANT
BLANKET WARM UNDERBOD FULL ACC (MISCELLANEOUS) ×2 IMPLANT
CATH MAPPNG PENTARAY F 2-6-2MM (CATHETERS) ×1 IMPLANT
CATH NAVISTAR SMARTTOUCH DF (ABLATOR) ×2 IMPLANT
CATH SOUNDSTAR 3D IMAGING (CATHETERS) ×2 IMPLANT
CATH WEBSTER BI DIR CS D-F CRV (CATHETERS) ×2 IMPLANT
COVER SWIFTLINK CONNECTOR (BAG) ×2 IMPLANT
HOVERMATT SINGLE USE (MISCELLANEOUS) ×2 IMPLANT
NEEDLE TRANSEP BRK 71CM 407200 (NEEDLE) ×2 IMPLANT
PACK EP LATEX FREE (CUSTOM PROCEDURE TRAY) ×1
PACK EP LF (CUSTOM PROCEDURE TRAY) ×1 IMPLANT
PAD DEFIB LIFELINK (PAD) ×2 IMPLANT
PATCH CARTO3 (PAD) ×2 IMPLANT
PENTARAY F 2-6-2MM (CATHETERS) ×2
SHEATH AVANTI 11F 11CM (SHEATH) ×2 IMPLANT
SHEATH PINNACLE 7F 10CM (SHEATH) ×4 IMPLANT
SHEATH PINNACLE 9F 10CM (SHEATH) ×2 IMPLANT
SHEATH SWARTZ TS SL2 63CM 8.5F (SHEATH) ×2 IMPLANT
TUBING SMART ABLATE COOLFLOW (TUBING) ×2 IMPLANT

## 2016-12-09 NOTE — Anesthesia Procedure Notes (Signed)
Procedure Name: LMA Insertion Date/Time: 12/09/2016 7:42 AM Performed by: Orlinda Blalock, Bynum Mccullars L Pre-anesthesia Checklist: Patient identified, Emergency Drugs available, Suction available and Patient being monitored Patient Re-evaluated:Patient Re-evaluated prior to inductionOxygen Delivery Method: Circle System Utilized Preoxygenation: Pre-oxygenation with 100% oxygen Intubation Type: IV induction Ventilation: Mask ventilation without difficulty LMA: LMA inserted LMA Size: 5.0 Number of attempts: 1 Placement Confirmation: positive ETCO2 Tube secured with: Tape Dental Injury: Teeth and Oropharynx as per pre-operative assessment

## 2016-12-09 NOTE — Interval H&P Note (Signed)
History and Physical Interval Note:  12/09/2016 6:03 AM  Nathaniel [name]  has presented today for surgery, with the diagnosis of afib  The various methods of treatment have been discussed with the patient and family. After consideration of risks, benefits and other options for treatment, the patient has consented to  Procedure(s): Atrial Fibrillation Ablation (N/A) as a surgical intervention .  The patient's history has been reviewed, patient examined, no change in status, stable for surgery.  I have reviewed the patient's chart and labs.  Questions were answered to the patient's satisfaction.    TEE reviewed with patient.  He reports compliance with eliquis without interruption.  Hillis Range MD, [facility] 12/09/2016 6:04 AM

## 2016-12-09 NOTE — Transfer of Care (Signed)
Immediate Anesthesia Transfer of Care Note  Patient: Nathaniel [name]  Procedure(s) Performed: Procedure(s): Atrial Fibrillation Ablation (N/A)  Patient Location: PACU and Cath Lab  Anesthesia Type:General  Level of Consciousness: awake, drowsy, patient cooperative and responds to stimulation  Airway & Oxygen Therapy: Patient Spontanous Breathing and Patient connected to nasal cannula oxygen  Post-op Assessment: Report given to RN, Post -op Vital signs reviewed and stable and Patient moving all extremities  Post vital signs: Reviewed and stable  Last Vitals:  Vitals:   12/09/16 0544 12/09/16 1044  BP: 118/85   Pulse: (!) 57   Temp: 36.5 C 36.2 C    Last Pain:  Vitals:   12/09/16 0544  TempSrc: Oral      Patients Stated Pain Goal: 3 (12/09/16 0646)  Complications: No apparent anesthesia complications

## 2016-12-09 NOTE — Anesthesia Postprocedure Evaluation (Signed)
Anesthesia Post Note  Patient: Nathaniel [name]  Procedure(s) Performed: Procedure(s) (LRB): Atrial Fibrillation Ablation (N/A)  Patient location during evaluation: PACU Anesthesia Type: General Level of consciousness: awake Pain management: pain level controlled Vital Signs Assessment: post-procedure vital signs reviewed and stable Respiratory status: spontaneous breathing Cardiovascular status: stable Anesthetic complications: no       Last Vitals:  Vitals:   12/09/16 1140 12/09/16 1150  BP: (!) 111/54 121/65  Pulse: 66 64  Resp: 17 15  Temp:      Last Pain:  Vitals:   12/09/16 0544  TempSrc: Oral                 Nathaniel [name]

## 2016-12-09 NOTE — Discharge Instructions (Addendum)
No driving for 4 days. No lifting over 5 lbs for 1 week. No sexual activity for 1 week. You may return to work in 1 week. Keep procedure site clean & dry. If you notice increased pain, swelling, bleeding or pus, call/return!  You may shower, but no soaking baths/hot tubs/pools for 1 week.  ° °You have an appointment set up with the [facility].  Multiple studies have shown that being followed by a dedicated atrial fibrillation clinic in addition to the standard care you receive from your other physicians improves health. We believe that enrollment in the atrial fibrillation clinic will allow us to better care for you.  ° °The phone number to the [facility]illation Clinic is [phone]. The clinic is staffed Monday through Friday from 8:30am to 5pm. ° °Parking Directions: The clinic is located in the Heart and Vascular Building connected to [facility]. °1)From [address] turn on to [address] and go to the 3rd entrance  (Heart and Vascular entrance) on the right. °2)Look to the right for Heart &Vascular Parking Garage. °3)A code for the entrance is required please call the clinic to receive this.   °4)Take the elevators to the 1st floor. Registration is in the room with the glass walls at the end of the hallway. ° °If you have any trouble parking or locating the clinic, please don’t hesitate to call [phone]. ° °================================================================================== ° ° °Information on my medicine - ELIQUIS® (apixaban) ° °Why was Eliquis® prescribed for you? °Eliquis® was prescribed for you to reduce the risk of a blood clot forming that can cause a stroke if you have a medical condition called atrial fibrillation (a type of irregular heartbeat). ° °What do You need to know about Eliquis® ? °Take your Eliquis® TWICE DAILY - one tablet in the morning and one tablet in the evening with or without food. If you have difficulty swallowing the tablet whole  please discuss with your pharmacist how to take the medication safely. ° °Take Eliquis® exactly as prescribed by your doctor and DO NOT stop taking Eliquis® without talking to the doctor who prescribed the medication.  Stopping may increase your risk of developing a stroke.  Refill your prescription before you run out. ° °After discharge, you should have regular check-up appointments with your healthcare provider that is prescribing your Eliquis®.  In the future your dose may need to be changed if your kidney function or weight changes by a significant amount or as you get older. ° °What do you do if you miss a dose? °If you miss a dose, take it as soon as you remember on the same day and resume taking twice daily.  Do not take more than one dose of ELIQUIS at the same time to make up a missed dose. ° °Important Safety Information °A possible side effect of Eliquis® is bleeding. You should call your healthcare provider right away if you experience any of the following: °? Bleeding from an injury or your nose that does not stop. °? Unusual colored urine (red or dark brown) or unusual colored stools (red or black). °? Unusual bruising for unknown reasons. °? A serious fall or if you hit your head (even if there is no bleeding). ° °Some medicines may interact with Eliquis® and might increase your risk of bleeding or clotting while on Eliquis®. To help avoid this, consult your healthcare provider or pharmacist prior to using any new prescription or non-prescription medications, including herbals, vitamins, non-steroidal anti-inflammatory drugs (  NSAIDs) and supplements. ° °This website has more information on Eliquis® (apixaban): http://www.eliquis.com/eliquis/home °

## 2016-12-09 NOTE — Anesthesia Preprocedure Evaluation (Signed)
Anesthesia Evaluation  Patient identified by MRN, date of birth, ID band Patient awake    Reviewed: Allergy & Precautions, NPO status , Patient's Chart, lab work & pertinent test results  Airway Mallampati: II  TM Distance: >3 FB     Dental   Pulmonary former smoker,    breath sounds clear to auscultation       Cardiovascular +CHF   Rhythm:Regular Rate:Normal     Neuro/Psych    GI/Hepatic negative GI ROS, Neg liver ROS,   Endo/Other  negative endocrine ROS  Renal/GU negative Renal ROS     Musculoskeletal   Abdominal   Peds  Hematology   Anesthesia Other Findings   Reproductive/Obstetrics                             Anesthesia Physical Anesthesia Plan  ASA: III  Anesthesia Plan: MAC   Post-op Pain Management:    Induction: Intravenous  Airway Management Planned: Mask and Simple Face Mask  Additional Equipment:   Intra-op Plan:   Post-operative Plan:   Informed Consent: I have reviewed the patients History and Physical, chart, labs and discussed the procedure including the risks, benefits and alternatives for the proposed anesthesia with the patient or authorized representative who has indicated his/her understanding and acceptance.   Dental advisory given  Plan Discussed with: CRNA and Anesthesiologist  Anesthesia Plan Comments:         Anesthesia Quick Evaluation

## 2016-12-09 NOTE — H&P (View-Only) (Signed)
Clinic Note  Date:  11/10/2016   ID:  Nathaniel [name], DOB [birth year], MRN [id number] Cardiologist:  none Primary Electrophysiologist: Dr Graciela Husbands  CC: afib   History of Present Illness: Nathaniel [name] is a 26 y.o. male who presents today for afib evaluation.  He has failed medical therapy with flecainide.  He feels that he has afib several times per day lasting up to 30 minutes.    Today, he denies symptoms of  chest pain, shortness of breath, orthopnea, PND, lower extremity edema, claudication, dizziness, presyncope, syncope, bleeding, or neurologic sequela. The patient is tolerating medications without difficulties and is otherwise without complaint today.    Past Medical History:  Diagnosis Date  . Atrial flutter (HCC) 01/17/2016   Admx 5/17 with AFlutter with RVR in setting of pancreatitis // CHADS2-VASc=1 (CHF) // Anticoag not started // Amiodarone >> NSR   . Chronic systolic CHF (congestive heart failure) (HCC)    Echo 01/17/16 - EF 35-40%, diffuse HK, mild LAE   . 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HCC) 02/03/2016   a. EF 35-40% in setting of AFlutter with RVR in 5/17 // probably tachycardia mediated // needs FU echo 2-3 mos after NSR restored // b. Limited Echo 7/17: EF 45-50%, diff HK, mild LAE  . Fatty liver 12/2015   per CT  . Obesity    BMI 45 12/2015  . Pancreatitis 2012   recurrent acute. Admissions to [facility], 12/2015 admissin to [facility] hospital, ct then shows pseudocyst.    Past Surgical History:  Procedure Laterality Date  . HAND SURGERY       Current Outpatient Prescriptions  Medication Sig Dispense Refill  . apixaban (ELIQUIS) 5 MG TABS tablet Take 1 tablet (5 mg total) by mouth 2 (two) times daily. 60 tablet 11  . Cholecalciferol (VITAMIN D PO) Take 1 tablet by mouth daily.    . flecainide (TAMBOCOR) 100 MG tablet Take 1 tablet (100 mg total) by mouth 2 (two) times daily. 60 tablet 3  . ibuprofen (ADVIL,MOTRIN) 200 MG tablet Take 400 mg by mouth every 6 (six) hours as  needed for mild pain.    [name] lisinopril (PRINIVIL,ZESTRIL) 5 MG tablet Take 1 tablet (5 mg total) by mouth daily. 30 tablet 10  . metoprolol succinate (TOPROL-XL) 25 MG 24 hr tablet Take 1 tablet (25 mg total) by mouth daily. 90 tablet 3  . Omega-3 Fatty Acids (FISH OIL PO) Take 1 capsule by mouth daily.    . traMADol (ULTRAM) 50 MG tablet Take 1 tablet (50 mg total) by mouth every 6 (six) hours as needed for moderate pain. 30 tablet 0   No current facility-administered medications for this visit.     Allergies:   Patient has no known allergies.   Social History:  The patient  reports that he has quit smoking. He uses smokeless tobacco. He reports that he drinks alcohol. He reports that he does not use drugs.   Family History:  The patient's  family history includes Diabetes in his mother.   Father and paternal aunts/uncles have "irregular heart beat".     ROS:  Please see the history of present illness.   All other systems are reviewed and negative.    PHYSICAL EXAM: VS:  Ht 6' (1.829 m)   Wt 295 lb 2 oz (133.9 kg)   BMI 40.03 kg/m  , BMI Body mass index is 40.03 kg/m. GEN: overweight, in no acute distress  HEENT: normal  Neck: no JVD, carotid bruits, or masses Cardiac:  RRR; no murmurs, rubs, or gallops,no edema  Respiratory:  clear to auscultation bilaterally, normal work of breathing GI: soft, nontender, nondistended, + BS MS: no deformity or atrophy  Skin: warm and dry  Neuro:  Strength and sensation are intact Psych: euthymic mood, full affect  EKG:  EKG is ordered today. The ekg ordered today shows sinus rhythm 82 bpm, PR 146 msec, QRS 78 msec, Qtc 425 msec, nonspecific St/T changes   Recent Labs: 01/18/2016: Magnesium 1.8 01/19/2016: B Natriuretic Peptide 146.9 03/04/2016: TSH 2.70 03/05/2016: ALT 19 03/06/2016: Hemoglobin 12.7; Platelets 267 03/31/2016: Potassium 4.6; Sodium 142 07/15/2016: BUN 17; Creatinine, Ser 0.80    Lipid Panel     Component Value Date/Time    CHOL 110 03/05/2016 0639   TRIG 85 03/05/2016 0639   HDL 30 (L) 03/05/2016 0639   CHOLHDL 3.7 03/05/2016 0639   VLDL 17 03/05/2016 0639   LDLCALC 63 03/05/2016 0639     Wt Readings from Last 3 Encounters:  11/10/16 295 lb 2 oz (133.9 kg)  10/08/16 296 lb (134.3 kg)  08/10/16 292 lb 12.8 oz (132.8 kg)    Other studies Reviewed: Additional studies/ records that were reviewed today include: Dr Koren Bound notes, prior echo, cardiac MRI again reviewed Review of the above records today demonstrates: as above   ASSESSMENT AND PLAN:  1.  Afib/ atrial flutter The patient has symptomatic atrial arrhythmias He has failed medical therapy with amiodarone and flecainide.  He is anticoagualted with eliquis.   Lifestyle modification was again discussed today.  He is not making much progress with this.  Therapeutic strategies for afib/ atrial flutter including medicine and ablation were discussed in detail with the patient today. Risk, benefits, and alternatives to EP study and radiofrequency ablation were also discussed in detail today. These risks include but are not limited to stroke, bleeding, vascular damage, tamponade, perforation, damage to the esophagus, lungs, and other structures, pulmonary vein stenosis, worsening renal function, and death. The patient understands these risk and wishes to proceed.  We will therefore proceed with catheter ablation at the next available time (he will coordinate based on his scheduled at [facility]).  TEE prior to ablation to exclude LAA thrombus.  2. Morbid obesity Body mass index is 40.03 kg/m. Weight loss advised    Current medicines are reviewed at length with the patient today.   The patient does not have concerns regarding his medicines.  The following changes were made today:  None   Signed, Hillis Range, MD  11/10/2016 2:13 PM     [facility] [address] [place] [address] [phone] (office) [phone] (fax)

## 2016-12-09 NOTE — Discharge Summary (Signed)
ELECTROPHYSIOLOGY PROCEDURE DISCHARGE SUMMARY    Patient ID: Nathaniel [name],  MRN: [id number], DOB/AGE: March 06, 1991 26 y.o.  Admit date: 12/09/2016 Discharge date: 12/10/2016  Primary Care Physician: No PCP Per Patient Electrophysiologist: Milford Cage, MD  Primary Discharge Diagnosis:  Paroxysmal atrial fibrillation and atrial flutter s/p ablation this admission  Secondary Discharge Diagnosis:  1.  Obesity 2.  Previous tachycardia induced cardiomyopathy  Procedures This Admission:  1.  Electrophysiology study and radiofrequency catheter ablation on 12/09/16 by Dr Hillis Range.  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  These were moderate in size.  The right superior pulmonary vein was very large in sinus and had two large branches.  The right inferior pulmonary vein was small; successful electrical isolation and anatomical encircling of all four pulmonary veins with radiofrequency current; cavo-tricuspid isthmus ablation was performed with complete bidirectional isthmus block achieved; no inducible arrhythmias following ablation both on and off of Isuprel; no early apparent complications.  Brief HPI: Nathaniel [name] is a 26 y.o. male with a history of paroxysmal atrial fibrillation and atrial flutter.  They have failed medical therapy with Flecainide and amiodarone. Risks, benefits, and alternatives to catheter ablation of atrial fibrillation were reviewed with the patient who wished to proceed.  The patient underwent TEE prior to the procedure which demonstrated normal LV function and no LAA thrombus.    Hospital Course:  The patient was admitted and underwent EPS/RFCA of atrial fibrillation with details as outlined above.  They were monitored on telemetry overnight which demonstrated sinus rhythm with intermittent atrial fibrillation.  Groin was without  complication on the day of discharge.  The patient was examined and considered to be stable for discharge.  Wound care and restrictions were reviewed with the patient.  The patient will be seen back by Rudi Coco, NP in 4 weeks and Dr Johney Frame in 12 weeks for post ablation follow up.   This patients CHA2DS2-VASc Score and unadjusted Ischemic Stroke Rate (% per year) is equal to 0.2 % stroke rate/year from a score of 0 Above score calculated as 1 point each if present [CHF, HTN, DM, Vascular=MI/PAD/Aortic Plaque, Age if 65-74, or Male] Above score calculated as 2 points each if present [Age > 75, or Stroke/TIA/TE]   Physical Exam: Vitals:   12/09/16 2123 12/10/16 0322 12/10/16 0323 12/10/16 0741  BP:  (!) 147/76  120/65  Pulse: (!) 103 82 75 73  Resp: 19 12 18 17   Temp:  98.1 F (36.7 C)  98.4 F (36.9 C)  TempSrc:  Oral  Oral  SpO2: 95% 96% 96% 96%  Weight:  (!) 301 lb 9.4 oz (136.8 kg)    Height:        GEN- The patient is obese appearing, alert and oriented x 3 today.   HEENT: normocephalic, atraumatic; sclera clear, conjunctiva pink; hearing intact; oropharynx clear; neck supple  Lungs- Clear to ausculation bilaterally, normal work of breathing.  No wheezes, rales, rhonchi Heart- Irregular rate and rhythm, no murmurs, rubs or gallops  GI- soft, non-tender, non-distended, bowel sounds present  Extremities- no clubbing, cyanosis, or edema; DP/PT/radial pulses 2+ bilaterally, groin without hematoma/bruit MS- no significant deformity or atrophy Skin- warm and dry, no rash or lesion Psych- euthymic mood, full affect Neuro- strength and sensation are intact   Labs:   Lab Results  Component Value Date   WBC 6.9 11/30/2016   HGB 12.7 (L)  03/06/2016   HCT 38.7 11/30/2016   MCV 87 11/30/2016   PLT 293 11/30/2016   No results for input(s): NA, K, CL, CO2, BUN, CREATININE, CALCIUM, PROT, BILITOT, ALKPHOS, ALT, AST, GLUCOSE in the last 168 hours.  Invalid input(s):  LABALBU   Discharge Medications:  Allergies as of 12/10/2016   No Known Allergies     Medication List    STOP taking these medications   famotidine 20 MG tablet Commonly known as:  PEPCID     TAKE these medications   apixaban 5 MG Tabs tablet Commonly known as:  ELIQUIS Take 1 tablet (5 mg total) by mouth 2 (two) times daily.   Fish Oil 1200 MG Caps Take 1,200 mg by mouth every evening.   flecainide 100 MG tablet Commonly known as:  TAMBOCOR Take 1 tablet (100 mg total) by mouth 2 (two) times daily.   lisinopril 5 MG tablet Commonly known as:  PRINIVIL,ZESTRIL Take 1 tablet (5 mg total) by mouth daily. What changed:  when to take this   metoprolol succinate 25 MG 24 hr tablet Commonly known as:  TOPROL-XL Take 1 tablet (25 mg total) by mouth daily. What changed:  when to take this   pantoprazole 40 MG tablet Commonly known as:  PROTONIX Take 1 tablet (40 mg total) by mouth daily. Take for 6 weeks then discontinue   triamcinolone cream 0.1 % Commonly known as:  KENALOG Apply 1 application topically every evening.   vitamin C 500 MG tablet Commonly known as:  ASCORBIC ACID Take 500 mg by mouth every evening.       Disposition:  Discharge Instructions    Diet - low sodium heart healthy    Complete by:  As directed    Increase activity slowly    Complete by:  As directed      Follow-up Information    [facility] Follow up on 01/10/2017.   Specialty:  Cardiology Why:  at 3:30PM  Contact information: [address] [id number] [place] [address] [phone]       Hillis Range, MD Follow up on 03/07/2017.   Specialty:  Cardiology Why:  at 9:30AM  Contact information: [address] [place] [address] [phone]           Duration of Discharge Encounter: Greater than 30 minutes including physician time.  Signed, Gypsy Balsam, NP 12/10/2016 8:44 AM   I have seen, examined  the patient, and reviewed the above assessment and plan.  On exam, RRR.  Groin without hematoma.  Changes to above are made where necessary.    Co Sign: Hillis Range, MD

## 2016-12-09 NOTE — Progress Notes (Signed)
Site area: Right groin a 7, 9, and 11 french venous sheath was removed  Site Prior to Removal:  Level 0  Pressure Applied For 20 MINUTES    Bedrest Beginning at 1140a  Manual:   Yes.    Patient Status During Pull:  stable  Post Pull Groin Site:  Level 0  Post Pull Instructions Given:  Yes.    Post Pull Pulses Present:  Yes.    Dressing Applied:  Yes.  Pressure dressing  Comments:  VS remain stable

## 2016-12-10 ENCOUNTER — Encounter: Payer: Self-pay | Admitting: Nurse Practitioner

## 2016-12-10 DIAGNOSIS — I480 Paroxysmal atrial fibrillation: Secondary | ICD-10-CM | POA: Diagnosis not present

## 2016-12-10 DIAGNOSIS — I420 Dilated cardiomyopathy: Secondary | ICD-10-CM | POA: Diagnosis not present

## 2016-12-10 DIAGNOSIS — Z6841 Body Mass Index (BMI) 40.0 and over, adult: Secondary | ICD-10-CM | POA: Diagnosis not present

## 2016-12-10 DIAGNOSIS — Z7901 Long term (current) use of anticoagulants: Secondary | ICD-10-CM | POA: Diagnosis not present

## 2016-12-10 DIAGNOSIS — Z720 Tobacco use: Secondary | ICD-10-CM | POA: Diagnosis not present

## 2016-12-10 DIAGNOSIS — I5022 Chronic systolic (congestive) heart failure: Secondary | ICD-10-CM | POA: Diagnosis not present

## 2016-12-10 DIAGNOSIS — I4892 Unspecified atrial flutter: Secondary | ICD-10-CM | POA: Diagnosis not present

## 2016-12-10 MED ORDER — PANTOPRAZOLE SODIUM 40 MG PO TBEC: 40.0000 mg | DELAYED_RELEASE_TABLET | Freq: Every day | ORAL | 0 refills | Status: DC

## 2016-12-10 MED ORDER — MIDAZOLAM HCL 2 MG/2ML IJ SOLN
INTRAMUSCULAR | Status: AC
  Filled 2016-12-10: qty 2

## 2016-12-10 MED ORDER — FENTANYL CITRATE (PF) 100 MCG/2ML IJ SOLN
INTRAMUSCULAR | Status: DC
Start: 2016-12-10 — End: 2016-12-10
  Filled 2016-12-10: qty 2

## 2016-12-10 MED ORDER — CIPROFLOXACIN IN D5W 400 MG/200ML IV SOLN
INTRAVENOUS | Status: AC
  Filled 2016-12-10: qty 200

## 2016-12-10 NOTE — Care Management Note (Signed)
Case Management Note  Patient Details  Name: Nathaniel [name] MRN: [id number] Date of Birth: 14-Aug-1991  Subjective/Objective:     s/p afib ablation, NCM will follow for dc needs.               Action/Plan:   Expected Discharge Date:  12/10/16               Expected Discharge Plan:  Home/Self Care  In-House Referral:     Discharge planning Services  CM Consult  Post Acute Care Choice:    Choice offered to:     DME Arranged:    DME Agency:     HH Arranged:    HH Agency:     Status of Service:  Completed, signed off  If discussed at [employer] of Stay Meetings, dates discussed:    Additional Comments:  Leone Haven, RN 12/10/2016, 8:42 AM

## 2016-12-17 NOTE — Addendum Note (Signed)
Addendum  created 12/17/16 1331 by Kipp Brood, MD   Anesthesia Staff edited

## 2016-12-23 ENCOUNTER — Other Ambulatory Visit (HOSPITAL_COMMUNITY): Payer: Self-pay | Admitting: Nurse Practitioner

## 2016-12-23 NOTE — Addendum Note (Signed)
Addendum  created 12/23/16 1215 by Dorris Singh, MD   Anesthesia Attestations filed

## 2017-01-10 ENCOUNTER — Encounter (HOSPITAL_COMMUNITY): Payer: Self-pay | Admitting: Nurse Practitioner

## 2017-01-10 ENCOUNTER — Ambulatory Visit (HOSPITAL_COMMUNITY)
Admission: RE | Admit: 2017-01-10 | Discharge: 2017-01-10 | Disposition: A | Discharge status: Home / Self Care | Payer: BLUE CROSS/BLUE SHIELD | Source: Ambulatory Visit | Attending: Nurse Practitioner | Admitting: Nurse Practitioner

## 2017-01-10 VITALS — BP 110/76 | HR 72 | Ht 72.0 in | Wt 303.0 lb

## 2017-01-10 DIAGNOSIS — Z79899 Other long term (current) drug therapy: Secondary | ICD-10-CM | POA: Insufficient documentation

## 2017-01-10 DIAGNOSIS — Z87891 Personal history of nicotine dependence: Secondary | ICD-10-CM | POA: Insufficient documentation

## 2017-01-10 DIAGNOSIS — I509 Heart failure, unspecified: Secondary | ICD-10-CM | POA: Insufficient documentation

## 2017-01-10 DIAGNOSIS — Z9889 Other specified postprocedural states: Secondary | ICD-10-CM | POA: Diagnosis not present

## 2017-01-10 DIAGNOSIS — I4891 Unspecified atrial fibrillation: Secondary | ICD-10-CM | POA: Diagnosis not present

## 2017-01-10 DIAGNOSIS — I480 Paroxysmal atrial fibrillation: Secondary | ICD-10-CM | POA: Insufficient documentation

## 2017-01-10 DIAGNOSIS — Z833 Family history of diabetes mellitus: Secondary | ICD-10-CM | POA: Diagnosis not present

## 2017-01-10 DIAGNOSIS — Z6841 Body Mass Index (BMI) 40.0 and over, adult: Secondary | ICD-10-CM | POA: Insufficient documentation

## 2017-01-10 DIAGNOSIS — I420 Dilated cardiomyopathy: Secondary | ICD-10-CM | POA: Insufficient documentation

## 2017-01-10 DIAGNOSIS — I4892 Unspecified atrial flutter: Secondary | ICD-10-CM | POA: Insufficient documentation

## 2017-01-10 DIAGNOSIS — K859 Acute pancreatitis without necrosis or infection, unspecified: Secondary | ICD-10-CM | POA: Diagnosis not present

## 2017-01-10 DIAGNOSIS — Z7901 Long term (current) use of anticoagulants: Secondary | ICD-10-CM | POA: Diagnosis not present

## 2017-01-10 DIAGNOSIS — E669 Obesity, unspecified: Secondary | ICD-10-CM | POA: Insufficient documentation

## 2017-01-10 MED ORDER — DILTIAZEM HCL 30 MG PO TABS: ORAL_TABLET | ORAL | 2 refills | Status: DC

## 2017-01-10 NOTE — Progress Notes (Signed)
Primary Care Physician: Patient, No Pcp Per Referring Physician:Dr. Allred   Nathaniel [name] is a 26 y.o. male with a h/o afib, failing flecainide that underwent afib ablation 4/12. He reporsts a few episodes of afib that had self corrected, the longest lasting 12 hours one week ago. Compliant with apixaban. He is reporting that he is burping more with hot brash that tastes bad but without any trouble swallowing or painful swallowing. No groin issues.He continues on flecainide.  Today, he denies symptoms of palpitations, chest pain, shortness of breath, orthopnea, PND, lower extremity edema, dizziness, presyncope, syncope, or neurologic sequela. The patient is tolerating medications without difficulties and is otherwise without complaint today.   Past Medical History:  Diagnosis Date  . Atrial flutter (HCC) 01/17/2016   Admx 5/17 with AFlutter with RVR in setting of pancreatitis // CHADS2-VASc=1 (CHF) // Anticoag not started // Amiodarone >> NSR   . Chronic lower back pain   . Chronic systolic CHF (congestive heart failure) (HCC)    Echo 01/17/16 - EF 35-40%, diffuse HK, mild LAE   . 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HCC) 02/03/2016   a. EF 35-40% in setting of AFlutter with RVR in 5/17 // probably tachycardia mediated // needs FU echo 2-3 mos after NSR restored // b. Limited Echo 7/17: EF 45-50%, diff HK, mild LAE  . Depression    "last 2-3 months; never treated for it" (12/09/2016)  . Fatty liver 12/2015   per CT  . Headache    "monthly" (12/09/2016)  . Obesity    BMI 45 12/2015  . Pancreatitis 2012   recurrent acute. Admissions to [facility], 12/2015 admissin to [facility] hospital, ct then shows pseudocyst.   . Pancreatitis 2012 X 2; 2017 X 2  . Paroxysmal atrial fibrillation [facility])    Past Surgical History:  Procedure Laterality Date  . ATRIAL FIBRILLATION ABLATION N/A 12/09/2016   Procedure: Atrial Fibrillation Ablation;  Surgeon: Hillis Range, MD;  Location: [facility] INVASIVE CV LAB;  Service:  Cardiovascular;  Laterality: N/A;  . FRACTURE SURGERY    . HAND SURGERY Right ~ 2008   "had pins in it; pulled them"  . TEE WITHOUT CARDIOVERSION N/A 12/08/2016   Procedure: TRANSESOPHAGEAL ECHOCARDIOGRAM (TEE);  Surgeon: Jake Bathe, MD;  Location: [facility] ENDOSCOPY;  Service: Cardiovascular;  Laterality: N/A;  . TONSILLECTOMY AND ADENOIDECTOMY     "in my teens"    Current Outpatient Prescriptions  Medication Sig Dispense Refill  . apixaban (ELIQUIS) 5 MG TABS tablet Take 1 tablet (5 mg total) by mouth 2 (two) times daily. 60 tablet 11  . flecainide (TAMBOCOR) 100 MG tablet TAKE 1 TABLET(100 MG) BY MOUTH TWICE DAILY 60 tablet 0  . lisinopril (PRINIVIL,ZESTRIL) 5 MG tablet Take 1 tablet (5 mg total) by mouth daily. (Patient taking differently: Take 5 mg by mouth every evening. ) 30 tablet 10  . metoprolol succinate (TOPROL-XL) 25 MG 24 hr tablet Take 1 tablet (25 mg total) by mouth daily. (Patient taking differently: Take 25 mg by mouth every evening. ) 90 tablet 3  . Omega-3 Fatty Acids (FISH OIL) 1200 MG CAPS Take 1,200 mg by mouth every evening.    . pantoprazole (PROTONIX) 40 MG tablet Take 1 tablet (40 mg total) by mouth daily. Take for 6 weeks then discontinue 45 tablet 0  . triamcinolone cream (KENALOG) 0.1 % Apply 1 application topically every evening.    . vitamin C (ASCORBIC ACID) 500 MG tablet Take 500 mg by mouth every evening.    [name]  diltiazem (CARDIZEM) 30 MG tablet Take 1 tablet every 4 hours AS NEEDED for rapid afib heart rate >100 30 tablet 2   No current facility-administered medications for this encounter.     No Known Allergies  Social History   Social History  . Marital status: Single    Spouse name: N/A  . Number of children: 0  . Years of education: 12   Occupational History  .  [employer]   Social History Main Topics  . Smoking status: Former Smoker    Packs/day: 0.50    Types: Cigarettes  . Smokeless tobacco: Current User    Types: Snuff      Comment: 12/09/2016 "quit chewing May 2017"  . Alcohol use Yes     Comment: 12/09/2016 "stopped 12/2015; never had problem w/it"  . Drug use: Yes    Types: Marijuana     Comment: 12/09/2016 "daily from age 27 to 74"  . Sexual activity: Yes    Birth control/ protection: None   Other Topics Concern  . Not on file   Social History Narrative   Pt lives in [place] with fiancee.  Works at [employer] in [place].    Family History  Problem Relation Age of Onset  . Diabetes Mother     ROS- All systems are reviewed and negative except as per the HPI above  Physical Exam: Vitals:   01/10/17 1603  BP: 110/76  Pulse: 72  Weight: (!) 303 lb (137.4 kg)  Height: 6' (1.829 m)   Wt Readings from Last 3 Encounters:  01/10/17 (!) 303 lb (137.4 kg)  12/10/16 (!) 301 lb 9.4 oz (136.8 kg)  12/08/16 295 lb (133.8 kg)    Labs: Lab Results  Component Value Date   NA 142 11/30/2016   K 3.9 11/30/2016   CL 106 11/30/2016   CO2 20 11/30/2016   GLUCOSE 95 11/30/2016   BUN 13 11/30/2016   CREATININE 0.61 (L) 11/30/2016   CALCIUM 8.4 (L) 11/30/2016   MG 1.8 01/18/2016   Lab Results  Component Value Date   INR 1.22 01/18/2016   Lab Results  Component Value Date   CHOL 110 03/05/2016   HDL 30 (L) 03/05/2016   LDLCALC 63 03/05/2016   TRIG 85 03/05/2016     GEN- The patient is well appearing, alert and oriented x 3 today.   Head- normocephalic, atraumatic Eyes-  Sclera clear, conjunctiva pink Ears- hearing intact Oropharynx- clear Neck- supple, no JVP Lymph- no cervical lymphadenopathy Lungs- Clear to ausculation bilaterally, normal work of breathing Heart- Regular rate and rhythm, no murmurs, rubs or gallops, PMI not laterally displaced GI- soft, NT, ND, + BS Extremities- no clubbing, cyanosis, or edema MS- no significant deformity or atrophy Skin- no rash or lesion Psych- euthymic mood, full affect Neuro- strength and sensation are intact  EKG-NSR at 72 bpm, Pr int 156 ms,  qrs int 88 ms, qtc 422 ms Epic records reviewed    Assessment and Plan: 1. afib S/p ablation 30 mg cardizem tablet was given as needed for breakthrough afib episodes Continue Eliquis  Continue flecainide 100 mg bid  2. Increased burping with foul taste No painful swallowing Continue protonix Stop fish oil and vitamin  C for a few weeks to see if any improvement  F/u with Dr. Johney Frame as scheduled 7/9  Lupita Leash [facility] [address] [place], [place] [address] [phone]

## 2017-01-10 NOTE — Patient Instructions (Signed)
Your physician has recommended you make the following change in your medication:  1)Cardizem 30mg  -- take 1 tablet every 4 hours AS NEEDED for rapid afib heart rate >100

## 2017-01-24 ENCOUNTER — Other Ambulatory Visit: Payer: Self-pay | Admitting: Nurse Practitioner

## 2017-01-24 ENCOUNTER — Other Ambulatory Visit (HOSPITAL_COMMUNITY): Payer: Self-pay | Admitting: Nurse Practitioner

## 2017-02-17 ENCOUNTER — Encounter: Payer: Self-pay | Admitting: *Deleted

## 2017-03-06 ENCOUNTER — Other Ambulatory Visit: Payer: Self-pay | Admitting: Internal Medicine

## 2017-03-07 ENCOUNTER — Other Ambulatory Visit: Payer: Self-pay | Admitting: Internal Medicine

## 2017-03-07 ENCOUNTER — Ambulatory Visit (INDEPENDENT_AMBULATORY_CARE_PROVIDER_SITE_OTHER): Payer: BLUE CROSS/BLUE SHIELD | Admitting: Internal Medicine

## 2017-03-07 ENCOUNTER — Encounter: Payer: Self-pay | Admitting: Internal Medicine

## 2017-03-07 VITALS — BP 110/72 | HR 62 | Ht 72.0 in | Wt 311.4 lb

## 2017-03-07 DIAGNOSIS — I480 Paroxysmal atrial fibrillation: Secondary | ICD-10-CM

## 2017-03-07 DIAGNOSIS — I481 Persistent atrial fibrillation: Secondary | ICD-10-CM | POA: Diagnosis not present

## 2017-03-07 DIAGNOSIS — I4819 Other persistent atrial fibrillation: Secondary | ICD-10-CM

## 2017-03-07 DIAGNOSIS — I428 Other cardiomyopathies: Secondary | ICD-10-CM

## 2017-03-07 NOTE — Patient Instructions (Addendum)
Medication Instructions:  Your physician recommends that you continue on your current medications as directed. Please refer to the Current Medication list given to you today.  Labwork: None    Testing/Procedures: Your physician has requested that you have an echocardiogram. Echocardiography is a painless test that uses sound waves to create images of your heart. It provides your doctor with information about the size and shape of your heart and how well your heart's chambers and valves are working. This procedure takes approximately one hour. There are no restrictions for this procedure.-----Schedule prior to 3 month follow up with Dr. Johney Frame.    Follow-Up: Your physician recommends that you schedule a follow-up appointment in: 3 months with Dr. Johney Frame.      [employer]iveCor  FDA-cleared EKG at your fingertips. - [employer]   [place], [employer]. https://store.alivecor.com/products/kardiamobile   FDA-cleared, clinical grade mobile EKG monitor: Lourena Simmonds is the most clinically-validated mobile EKG used by the world's leading cardiac care medical professionals.  This may be useful in monitoring palpitations.  We do not have access to have them emailed and reviewed but will be glad to review while in the office.

## 2017-03-07 NOTE — Progress Notes (Signed)
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  Since his recent afib ablation, the patient reports doing very well.  he denies procedure related complications and is pleased with the results of the procedure.  He feels that he "is a new person".  His wife is also pleased.  + rare palpitations.  Today, he denies symptoms of chest pain, shortness of breath,  lower extremity edema, dizziness, presyncope, or syncope.  The patient is otherwise without complaint today.   Past Medical History:  Diagnosis Date  . Atrial flutter (HCC) 01/17/2016   Admx 5/17 with AFlutter with RVR in setting of pancreatitis // CHADS2-VASc=1 (CHF) // Anticoag not started // Amiodarone >> NSR   . Chronic lower back pain   . Chronic systolic CHF (congestive heart failure) (HCC)    Echo 01/17/16 - EF 35-40%, diffuse HK, mild LAE   . 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HCC) 02/03/2016   a. EF 35-40% in setting of AFlutter with RVR in 5/17 // probably tachycardia mediated // needs FU echo 2-3 mos after NSR restored // b. Limited Echo 7/17: EF 45-50%, diff HK, mild LAE  . Depression    "last 2-3 months; never treated for it" (12/09/2016)  . Fatty liver 12/2015   per CT  . Headache    "monthly" (12/09/2016)  . Obesity    BMI 45 12/2015  . Pancreatitis 2012   recurrent acute. Admissions to [facility], 12/2015 admissin to [facility] hospital, ct then shows pseudocyst.   . Pancreatitis 2012 X 2; 2017 X 2  . Paroxysmal atrial fibrillation [facility])    Past Surgical History:  Procedure Laterality Date  . ATRIAL FIBRILLATION ABLATION N/A 12/09/2016   Procedure: Atrial Fibrillation Ablation;  Surgeon: Hillis Range, MD;  Location: [facility] INVASIVE CV LAB;  Service: Cardiovascular;  Laterality: N/A;  . FRACTURE SURGERY    . HAND SURGERY Right ~ 2008   "had pins in it; pulled them"  . TEE WITHOUT CARDIOVERSION N/A 12/08/2016   Procedure: TRANSESOPHAGEAL  ECHOCARDIOGRAM (TEE);  Surgeon: Jake Bathe, MD;  Location[facility] ENDOSCOPY;  Service: Cardiovascular;  Laterality: N/A;  . TONSILLECTOMY AND ADENOIDECTOMY     "in my teens"    ROS- all systems are personally reviewed and negatives except as per HPI above  Current Outpatient Prescriptions  Medication Sig Dispense Refill  . apixaban (ELIQUIS) 5 MG TABS tablet Take 1 tablet (5 mg total) by mouth 2 (two) times daily. 60 tablet 11  . diltiazem (CARDIZEM) 30 MG tablet Take 1 tablet every 4 hours AS NEEDED for rapid afib heart rate >100 30 tablet 2  . flecainide (TAMBOCOR) 100 MG tablet TAKE 1 TABLET(100 MG) BY MOUTH TWICE DAILY 60 tablet 3  . lisinopril (PRINIVIL,ZESTRIL) 5 MG tablet Take 1 tablet (5 mg total) by mouth daily. 30 tablet 10  . metoprolol succinate (TOPROL-XL) 25 MG 24 hr tablet Take 1 tablet (25 mg total) by mouth daily. 90 tablet 3  . triamcinolone cream (KENALOG) 0.1 % Apply 1 application topically every evening.     No current facility-administered medications for this visit.     Physical Exam: Vitals:   03/07/17 0954  BP: 110/72  Pulse: 62  SpO2: 97%  Weight: (!) 311 lb 6.4 oz (141.3 kg)  Height: 6' (1.829 m)    GEN- The patient is well appearing, alert and oriented x 3 today.   Head- normocephalic, atraumatic Eyes-  Sclera clear, conjunctiva pink Ears- hearing intact Oropharynx- clear Lungs- Clear to ausculation bilaterally, normal work of breathing Heart- Regular rate and rhythm, no murmurs, rubs or gallops, PMI not laterally displaced GI- soft, NT, ND, + BS Extremities- no clubbing, cyanosis, or edema  EKG tracing ordered today is personally reviewed and shows sinus rhythm 62 bpm,  Assessment and Plan:  1. Persistent/ paroxysmal atrial fibrillation Doing well s/p ablation chads2vasc score is 1 Consider stopping flecainide and eliquis if no further afib on return We discussed [employer] and also [employer] LINQ as the best options for additional monitoring  should his palpitations persist.  I would advise against 30 day monitors in him going forward given his lifestyle.  2. Obesity Body mass index is 42.23 kg/m. No progress with lifestyle modification unfortunately  3. 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

## 2017-03-28 ENCOUNTER — Other Ambulatory Visit: Payer: Self-pay | Admitting: Internal Medicine

## 2017-03-28 NOTE — Telephone Encounter (Signed)
Request received for Eliquis 5mg ; pt is 26 yrs old, wt-141.3kg, Crea-0.61 on 11/30/16, & last seen by 03/07/17 by Dr. Johney Frame. Will send in refill request to requested Pharmacy.

## 2017-04-13 ENCOUNTER — Ambulatory Visit (INDEPENDENT_AMBULATORY_CARE_PROVIDER_SITE_OTHER): Payer: BLUE CROSS/BLUE SHIELD

## 2017-04-13 ENCOUNTER — Other Ambulatory Visit: Payer: Self-pay

## 2017-04-13 DIAGNOSIS — I480 Paroxysmal atrial fibrillation: Secondary | ICD-10-CM

## 2017-04-29 ENCOUNTER — Telehealth: Payer: Self-pay | Admitting: *Deleted

## 2017-04-29 NOTE — Telephone Encounter (Signed)
-----   Message from Hillis Range, MD sent at 04/19/2017 11:27 PM EDT ----- Results reviewed.  Isabelle Course, please inform pt of result. I will route to primary care also.

## 2017-04-29 NOTE — Telephone Encounter (Signed)
Patient informed. 

## 2017-05-03 ENCOUNTER — Other Ambulatory Visit: Payer: Self-pay | Admitting: Internal Medicine

## 2017-05-05 NOTE — Telephone Encounter (Signed)
Looks like he will be seeing Dr. Johney Frame in the [place] office going forward.

## 2017-05-05 NOTE — Telephone Encounter (Signed)
Is this patient going to follow up with Dr Graciela Husbands? Last office visit 07/2016, does not indicate and he has been seeing Dr Johney Frame. Please advise. Thanks, MI

## 2017-06-04 ENCOUNTER — Emergency Department (HOSPITAL_COMMUNITY): Payer: BLUE CROSS/BLUE SHIELD

## 2017-06-04 ENCOUNTER — Observation Stay (HOSPITAL_COMMUNITY)
Admission: EM | Admit: 2017-06-04 | Discharge: 2017-06-05 | Disposition: A | Discharge status: Home / Self Care | Payer: BLUE CROSS/BLUE SHIELD | Attending: Cardiovascular Disease | Admitting: Cardiovascular Disease

## 2017-06-04 ENCOUNTER — Encounter (HOSPITAL_COMMUNITY): Payer: Self-pay | Admitting: Emergency Medicine

## 2017-06-04 DIAGNOSIS — Z87891 Personal history of nicotine dependence: Secondary | ICD-10-CM | POA: Insufficient documentation

## 2017-06-04 DIAGNOSIS — E876 Hypokalemia: Secondary | ICD-10-CM

## 2017-06-04 DIAGNOSIS — R112 Nausea with vomiting, unspecified: Secondary | ICD-10-CM | POA: Insufficient documentation

## 2017-06-04 DIAGNOSIS — I4892 Unspecified atrial flutter: Secondary | ICD-10-CM

## 2017-06-04 DIAGNOSIS — R0602 Shortness of breath: Secondary | ICD-10-CM

## 2017-06-04 DIAGNOSIS — Z9889 Other specified postprocedural states: Secondary | ICD-10-CM | POA: Insufficient documentation

## 2017-06-04 DIAGNOSIS — Z7901 Long term (current) use of anticoagulants: Secondary | ICD-10-CM | POA: Insufficient documentation

## 2017-06-04 DIAGNOSIS — I480 Paroxysmal atrial fibrillation: Secondary | ICD-10-CM | POA: Diagnosis not present

## 2017-06-04 DIAGNOSIS — Z8679 Personal history of other diseases of the circulatory system: Secondary | ICD-10-CM

## 2017-06-04 DIAGNOSIS — Z79899 Other long term (current) drug therapy: Secondary | ICD-10-CM | POA: Diagnosis not present

## 2017-06-04 DIAGNOSIS — I420 Dilated cardiomyopathy: Secondary | ICD-10-CM | POA: Diagnosis not present

## 2017-06-04 DIAGNOSIS — D649 Anemia, unspecified: Secondary | ICD-10-CM | POA: Insufficient documentation

## 2017-06-04 DIAGNOSIS — Z6841 Body Mass Index (BMI) 40.0 and over, adult: Secondary | ICD-10-CM | POA: Insufficient documentation

## 2017-06-04 DIAGNOSIS — K2970 Gastritis, unspecified, without bleeding: Secondary | ICD-10-CM | POA: Insufficient documentation

## 2017-06-04 DIAGNOSIS — K863 Pseudocyst of pancreas: Secondary | ICD-10-CM | POA: Diagnosis not present

## 2017-06-04 DIAGNOSIS — I5022 Chronic systolic (congestive) heart failure: Secondary | ICD-10-CM | POA: Insufficient documentation

## 2017-06-04 DIAGNOSIS — K529 Noninfective gastroenteritis and colitis, unspecified: Secondary | ICD-10-CM

## 2017-06-04 DIAGNOSIS — R109 Unspecified abdominal pain: Secondary | ICD-10-CM | POA: Diagnosis not present

## 2017-06-04 DIAGNOSIS — R1110 Vomiting, unspecified: Secondary | ICD-10-CM | POA: Diagnosis not present

## 2017-06-04 DIAGNOSIS — R1084 Generalized abdominal pain: Secondary | ICD-10-CM | POA: Diagnosis not present

## 2017-06-04 LAB — CBC
HCT: 46.9 % (ref 39.0–52.0)
Hemoglobin: 15.5 g/dL (ref 13.0–17.0)
MCH: 29.5 pg (ref 26.0–34.0)
MCHC: 33 g/dL (ref 30.0–36.0)
MCV: 89.2 fL (ref 78.0–100.0)
Platelets: 264 10*3/uL (ref 150–400)
RBC: 5.26 MIL/uL (ref 4.22–5.81)
RDW: 12.9 % (ref 11.5–15.5)
WBC: 11.7 10*3/uL — ABNORMAL HIGH (ref 4.0–10.5)

## 2017-06-04 LAB — COMPREHENSIVE METABOLIC PANEL
ALT: 45 U/L (ref 17–63)
AST: 33 U/L (ref 15–41)
Albumin: 4 g/dL (ref 3.5–5.0)
Alkaline Phosphatase: 42 U/L (ref 38–126)
Anion gap: 10 (ref 5–15)
BUN: 17 mg/dL (ref 6–20)
CO2: 23 mmol/L (ref 22–32)
Calcium: 8.6 mg/dL — ABNORMAL LOW (ref 8.9–10.3)
Chloride: 105 mmol/L (ref 101–111)
Creatinine, Ser: 0.75 mg/dL (ref 0.61–1.24)
GFR calc Af Amer: >60 mL/min (ref >60)
GFR calc non Af Amer: >60 mL/min (ref >60)
Glucose, Bld: 134 mg/dL — ABNORMAL HIGH (ref 65–99)
Potassium: 3.9 mmol/L (ref 3.5–5.1)
Sodium: 138 mmol/L (ref 135–145)
Total Bilirubin: 1 mg/dL (ref 0.3–1.2)
Total Protein: 7.7 g/dL (ref 6.5–8.1)

## 2017-06-04 LAB — URINALYSIS, ROUTINE W REFLEX MICROSCOPIC
Bilirubin Urine: NEGATIVE
Glucose, UA: NEGATIVE mg/dL
Hgb urine dipstick: NEGATIVE
Ketones, ur: NEGATIVE mg/dL
Leukocytes, UA: NEGATIVE
Nitrite: NEGATIVE
Protein, ur: NEGATIVE mg/dL
Specific Gravity, Urine: 1.025 (ref 1.005–1.030)
pH: 7 (ref 5.0–8.0)

## 2017-06-04 LAB — TROPONIN I: Troponin I: <0.03 ng/mL (ref <0.03)

## 2017-06-04 LAB — LIPASE, BLOOD: Lipase: 22 U/L (ref 11–51)

## 2017-06-04 MED ORDER — ONDANSETRON HCL 4 MG/2ML IJ SOLN
4.0000 mg | Freq: Once | INTRAMUSCULAR | Status: AC
  Administered 2017-06-04: 4 mg via INTRAVENOUS
  Filled 2017-06-04: qty 2

## 2017-06-04 MED ORDER — LISINOPRIL 5 MG PO TABS
5.0000 mg | ORAL_TABLET | Freq: Every day | ORAL | Status: DC
  Administered 2017-06-05: 5 mg via ORAL
  Filled 2017-06-04: qty 1

## 2017-06-04 MED ORDER — DILTIAZEM HCL-DEXTROSE 100-5 MG/100ML-% IV SOLN (PREMIX): 10.0000 mg/h | Freq: Once | INTRAVENOUS | Status: DC

## 2017-06-04 MED ORDER — APIXABAN 5 MG PO TABS
ORAL_TABLET | ORAL | Status: AC
  Filled 2017-06-04: qty 1

## 2017-06-04 MED ORDER — DILTIAZEM HCL-DEXTROSE 100-5 MG/100ML-% IV SOLN (PREMIX)
INTRAVENOUS | Status: AC
  Filled 2017-06-04: qty 100

## 2017-06-04 MED ORDER — APIXABAN 5 MG PO TABS
5.0000 mg | ORAL_TABLET | Freq: Two times a day (BID) | ORAL | Status: DC
  Administered 2017-06-04 – 2017-06-05 (×2): 5 mg via ORAL
  Filled 2017-06-04 (×2): qty 1

## 2017-06-04 MED ORDER — HYDROMORPHONE HCL 1 MG/ML IJ SOLN
0.5000 mg | Freq: Once | INTRAMUSCULAR | Status: AC
  Administered 2017-06-04: 0.5 mg via INTRAVENOUS
  Filled 2017-06-04: qty 1

## 2017-06-04 MED ORDER — OXYCODONE-ACETAMINOPHEN 5-325 MG PO TABS
1.0000 | ORAL_TABLET | Freq: Once | ORAL | Status: AC
  Administered 2017-06-04: 1 via ORAL
  Filled 2017-06-04: qty 1

## 2017-06-04 MED ORDER — DILTIAZEM HCL 25 MG/5ML IV SOLN
10.0000 mg | Freq: Once | INTRAVENOUS | Status: AC
  Administered 2017-06-04: 10 mg via INTRAVENOUS
  Filled 2017-06-04: qty 5

## 2017-06-04 MED ORDER — SODIUM CHLORIDE 0.9 % IV BOLUS (SEPSIS)
2000.0000 mL | Freq: Once | INTRAVENOUS | Status: AC
  Administered 2017-06-04: 1000 mL via INTRAVENOUS

## 2017-06-04 MED ORDER — FLECAINIDE ACETATE 100 MG PO TABS
100.0000 mg | ORAL_TABLET | Freq: Two times a day (BID) | ORAL | Status: DC
  Administered 2017-06-04 – 2017-06-05 (×2): 100 mg via ORAL
  Filled 2017-06-04 (×3): qty 1

## 2017-06-04 MED ORDER — DILTIAZEM HCL-DEXTROSE 100-5 MG/100ML-% IV SOLN (PREMIX)
5.0000 mg/h | Freq: Once | INTRAVENOUS | Status: AC
  Administered 2017-06-04: 5 mg/h via INTRAVENOUS
  Filled 2017-06-04: qty 100

## 2017-06-04 MED ORDER — APIXABAN 5 MG PO TABS
5.0000 mg | ORAL_TABLET | Freq: Once | ORAL | Status: AC
  Administered 2017-06-04: 5 mg via ORAL
  Filled 2017-06-04: qty 1

## 2017-06-04 MED ORDER — ONDANSETRON HCL 4 MG/2ML IJ SOLN
4.0000 mg | Freq: Four times a day (QID) | INTRAMUSCULAR | Status: DC | PRN
  Administered 2017-06-04: 4 mg via INTRAVENOUS

## 2017-06-04 MED ORDER — FLECAINIDE ACETATE 100 MG PO TABS
100.0000 mg | ORAL_TABLET | Freq: Once | ORAL | Status: AC
  Administered 2017-06-04: 100 mg via ORAL
  Filled 2017-06-04: qty 1

## 2017-06-04 MED ORDER — IOPAMIDOL (ISOVUE-300) INJECTION 61%
100.0000 mL | Freq: Once | INTRAVENOUS | Status: AC | PRN
  Administered 2017-06-04: 100 mL via INTRAVENOUS

## 2017-06-04 MED ORDER — METOPROLOL SUCCINATE ER 25 MG PO TB24
25.0000 mg | ORAL_TABLET | Freq: Every day | ORAL | Status: DC
  Administered 2017-06-05: 25 mg via ORAL
  Filled 2017-06-04: qty 1

## 2017-06-04 MED ORDER — DILTIAZEM HCL 25 MG/5ML IV SOLN
10.0000 mg | Freq: Once | INTRAVENOUS | Status: AC
  Administered 2017-06-04: 10 mg via INTRAVENOUS

## 2017-06-04 MED ORDER — PANTOPRAZOLE SODIUM 40 MG IV SOLR
40.0000 mg | Freq: Once | INTRAVENOUS | Status: AC
  Administered 2017-06-04: 40 mg via INTRAVENOUS
  Filled 2017-06-04: qty 40

## 2017-06-04 MED ORDER — ACETAMINOPHEN 325 MG PO TABS
650.0000 mg | ORAL_TABLET | ORAL | Status: DC | PRN
  Administered 2017-06-05: 650 mg via ORAL
  Filled 2017-06-04: qty 2

## 2017-06-04 NOTE — ED Notes (Signed)
Pt from CT reports that he became sweaty and N after the dye infusion  Reports that he has never felt that reaction before

## 2017-06-04 NOTE — ED Notes (Signed)
Pt reports that he has had an ablation in May if this year  He insisted upon an IV to his hand rather than his [facility]  Dr Estell Harpin in frequently as is this RN to assess his HR

## 2017-06-04 NOTE — H&P (Signed)
Cardiology Admission History and Physical:   Patient ID: Nathaniel [name]; MRN: [id number]; DOB: 1990-10-25   Admission date: 06/04/2017  Primary Care Provider: Patient, No Pcp Per Primary Cardiologist: Dr. Graciela Husbands Primary Electrophysiologist:  Dr. Johney Frame   Chief Complaint:  Nausea/vomitting  Patient Profile:   Nathaniel [name] is a 26 y.o. male with a history of AF/Aflutter (s/p ablation 11/2016), CHF (EF 35-45%, now recovered, thought to be tachycardia induced), pancreatitis (w/ known psuedocyst) who presented to [facility] [place] w/ Aflutter w/ RVR.  History of Present Illness:   Mr. Bracamonte reports that he just got back from his honeymoon in [place] 2 nights ago and woke up this morning with knots in his stomach, vomitting couldn't keep his medications down. Given his hx of pancreatitis, he went to the ER for further evaluation.  There he was noetd to be in A flutter with HRs in the 160s. He had a CT A/P that showed his known chronic pancreatitis psuedocyst, but no other acute pathology.  Given his RVR he was started on a diltiazem gtt and the ER requested to transfer to [facility]  He reports that he has frequent episodes of AF w/ RVR and typically takes dilitazem as needed to break them.  He states that his last dose was on the plane ride back from [place] a 2 nights ago and his rhythm broke like it normally does.  Today, he didn't know that he was out of rhythm until he got to the ED.  He states that he has been compliant with his medications and his only missed doses were this morning when he couldn't keep anything down.    His wife is with him and does not have any GI illlness symptoms at this point.  They state that they avoided drinking water while in [place], but did have drinks with ice in them.  He states that prior to this morning he had felt well with no fever, chills, ns, PND, orthopnea.   Past Medical History:  Diagnosis Date  . Atrial flutter (HCC) 01/17/2016   Admx 5/17 with  AFlutter with RVR in setting of pancreatitis // CHADS2-VASc=1 (CHF) // Anticoag not started // Amiodarone >> NSR   . Chronic lower back pain   . Chronic systolic CHF (congestive heart failure) (HCC)    Echo 01/17/16 - EF 35-40%, diffuse HK, mild LAE   . 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HCC) 02/03/2016   a. EF 35-40% in setting of AFlutter with RVR in 5/17 // probably tachycardia mediated // needs FU echo 2-3 mos after NSR restored // b. Limited Echo 7/17: EF 45-50%, diff HK, mild LAE  . Depression    "last 2-3 months; never treated for it" (12/09/2016)  . Fatty liver 12/2015   per CT  . Headache    "monthly" (12/09/2016)  . Obesity    BMI 45 12/2015  . Pancreatitis 2012   recurrent acute. Admissions to [facility], 12/2015 admissin to [facility] hospital, ct then shows pseudocyst.   . Pancreatitis 2012 X 2; 2017 X 2  . Paroxysmal atrial fibrillation [facility])     Past Surgical History:  Procedure Laterality Date  . ATRIAL FIBRILLATION ABLATION N/A 12/09/2016   Procedure: Atrial Fibrillation Ablation;  Surgeon: Hillis Range, MD;  Location: [facility] INVASIVE CV LAB;  Service: Cardiovascular;  Laterality: N/A;  . FRACTURE SURGERY    . HAND SURGERY Right ~ 2008   "had pins in it; pulled them"  . TEE WITHOUT CARDIOVERSION N/A 12/08/2016   Procedure: TRANSESOPHAGEAL  ECHOCARDIOGRAM (TEE);  Surgeon: Jake Bathe, MD;  Location: [facility] ENDOSCOPY;  Service: Cardiovascular;  Laterality: N/A;  . TONSILLECTOMY AND ADENOIDECTOMY     "in my teens"     Medications Prior to Admission: Prior to Admission medications   Medication Sig Start Date End Date Taking? Authorizing Provider  diltiazem (CARDIZEM) 30 MG tablet Take 1 tablet every 4 hours AS NEEDED for rapid afib heart rate >100 01/10/17  Yes Newman Nip, NP  ELIQUIS 5 MG TABS tablet TAKE 1 TABLET BY MOUTH TWICE DAILY 03/28/17  Yes Allred, Fayrene Fearing, MD  flecainide (TAMBOCOR) 100 MG tablet TAKE 1 TABLET(100 MG) BY MOUTH TWICE DAILY 01/25/17  Yes Newman Nip, NP  lisinopril  (PRINIVIL,ZESTRIL) 5 MG tablet TAKE 1 TABLET(5 MG) BY MOUTH DAILY 03/07/17  Yes Duke Salvia, MD  ranitidine (ZANTAC) 150 MG capsule Take 150 mg by mouth 2 (two) times daily.   Yes [provider]  triamcinolone cream (KENALOG) 0.1 % Apply 1 application topically every evening.   Yes [provider]  metoprolol succinate (TOPROL-XL) 25 MG 24 hr tablet TAKE 1 TABLET(25 MG) BY MOUTH DAILY Patient not taking: Reported on 06/04/2017 05/05/17   Hillis Range, MD     Allergies:   No Known Allergies  Social History:   Social History   Social History  . Marital status: Single    Spouse name: N/A  . Number of children: 0  . Years of education: 12   Occupational History  .  [employer]   Social History Main Topics  . Smoking status: Former Smoker    Packs/day: 0.50    Types: Cigarettes  . Smokeless tobacco: Current User    Types: Snuff     Comment: 12/09/2016 "quit chewing May 2017"  . Alcohol use Yes     Comment: occasional  . Drug use: Yes    Types: Marijuana     Comment: 12/09/2016 "daily from age 34 to 81"  . Sexual activity: Yes    Birth control/ protection: None   Other Topics Concern  . Not on file   Social History Narrative   Pt lives in [place] with fiancee.  Works at [employer] in [place].    Family History:   The patient's family history includes Diabetes in his mother.    ROS:  Please see the history of present illness.  Cardiovascular: occasional palpitations, no CP, no SOB Pulm: no cough, no congestion Abd: Positive nausea/vomitting, no diarrhea All other ROS reviewed and negative.     Physical Exam/Data:   Vitals:   06/04/17 2000 06/04/17 2006 06/04/17 2008 06/04/17 2120  BP: (!) 97/36 (!) 106/54 (!) 97/36 116/73  Pulse: (!) 133 (!) 139 (!) 133 (!) 159  Resp: (!) 34 (!) 23 (!) 24 (!) 22  Temp:   98.1 F (36.7 C) (!) 100.9 F (38.3 C)  TempSrc:    Oral  SpO2: 97% 97% 97% 97%  Weight:    (!) 146.8 kg (323 lb 9.6 oz)  Height:     6' (1.829 m)    Intake/Output Summary (Last 24 hours) at 06/04/17 2306 Last data filed at 06/04/17 2126  Gross per 24 hour  Intake                0 ml  Output              300 ml  Net             -300 ml   Filed  Weights   06/04/17 1336 06/04/17 2120  Weight: 136.1 kg (300 lb) (!) 146.8 kg (323 lb 9.6 oz)   Body mass index is 43.89 kg/m.  General:  Well nourished, well developed, in no acute distress, mildly diaphoretic (states feels hot in room) HEENT: normal Lymph: no adenopathy Neck: no JVD Endocrine:  No thryomegaly Vascular: No carotid bruits; FA pulses 2+ bilaterally without bruits  Cardiac:  normal S1, S2; RRR; no murmur  Lungs:  clear to auscultation bilaterally, no wheezing, rhonchi or rales  Abd: soft, nontender, no hepatomegaly  Ext: no edema Musculoskeletal:  No deformities, BUE and BLE strength normal and equal Skin: warm and dry  Neuro:  CNs 2-12 intact, no focal abnormalities noted Psych:  Normal affect    EKG:  The ECG that was done was personally reviewed and demonstrates NSR w/ PACs  Relevant CV Studies: TTE 04/13/17: - Left ventricle: The cavity size was mildly dilated. Wall   thickness was normal. Systolic function was normal. The estimated   ejection fraction was in the range of 50% to 55%. Wall motion was   normal; there were no regional wall motion abnormalities. Left   ventricular diastolic function parameters were normal. - Mitral valve: There was trivial regurgitation. - Right atrium: Central venous pressure (est): 3 mm Hg. - Atrial septum: No defect or patent foramen ovale was identified. - Tricuspid valve: There was trivial regurgitation. - Pulmonary arteries: PA peak pressure: 15 mm Hg (S). - Pericardium, extracardiac: There was no pericardial effusion.  Impressions:  - Normal LV wall thickness with mild chamber dilatation and LVEF   50-55%. Normal diastolic function. Trivial mitral and tricuspid   regurgitation.  EP study/ablation  12/10/16:  Electrophysiology study and radiofrequency catheter ablation on 12/09/16 by Dr Hillis Range.  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 These were moderate in size. The right superior pulmonary vein was very large in sinus and had two large branches. The right inferior pulmonary vein was small; successful electrical isolation and anatomical encircling of all four pulmonary veins with radiofrequency current; cavo-tricuspid isthmus ablation was performed with complete bidirectional isthmus block achieved; no inducible arrhythmias following ablation both on and off of Isuprel; no early apparent complications.  Laboratory Data:  Chemistry Recent Labs Lab 06/04/17 1352  NA 138  K 3.9  CL 105  CO2 23  GLUCOSE 134*  BUN 17  CREATININE 0.75  CALCIUM 8.6*  GFRNONAA >60  GFRAA >60  ANIONGAP 10     Recent Labs Lab 06/04/17 1352  PROT 7.7  ALBUMIN 4.0  AST 33  ALT 45  ALKPHOS 42  BILITOT 1.0   Hematology Recent Labs Lab 06/04/17 1352  WBC 11.7*  RBC 5.26  HGB 15.5  HCT 46.9  MCV 89.2  MCH 29.5  MCHC 33.0  RDW 12.9  PLT 264   Cardiac Enzymes Recent Labs Lab 06/04/17 1653  TROPONINI <0.03   No results for input(s): TROPIPOC in the last 168 hours.  BNPNo results for input(s): BNP, PROBNP in the last 168 hours.  DDimer No results for input(s): DDIMER in the last 168 hours.  Radiology/Studies:  Ct Abdomen Pelvis W Contrast  Result Date: 06/04/2017 CLINICAL DATA:  Generalized abdominal pain and distention with nausea and vomiting since this morning. History of pancreatitis. EXAM: CT ABDOMEN AND PELVIS WITH CONTRAST TECHNIQUE: Multidetector CT imaging of the abdomen and pelvis was performed using the standard protocol  following bolus administration of intravenous contrast. CONTRAST:  ISOVUE-300 IOPAMIDOL (ISOVUE-300) INJECTION 61% COMPARISON:   07/27/2016 MRI abdomen.  03/05/2016 CT abdomen/ pelvis. FINDINGS: Motion degraded scan. Lower chest: No significant pulmonary nodules or acute consolidative airspace disease. Hepatobiliary: Diffuse hepatic steatosis. No definite liver surface irregularity. No liver mass. Normal gallbladder with no radiopaque cholelithiasis. No biliary ductal dilatation. Pancreas: No pancreatic mass or duct dilation. Heterogeneous fatty infiltration throughout the pancreas. No pancreatic parenchymal calcifications. No significant acute peripancreatic fat stranding or fluid to suggest acute pancreatitis. Mildly uniformly thick-walled irregular 4.5 x 2.6 x 3.2 cm fluid collection at the anterior margin of the pancreatic head abutting the gastric antrum (series 2/image 23), previously 4.2 x 3.4 x 5.0 cm on 03/05/2016 using similar measurement technique, not significantly changed . No new peripancreatic fluid collections. Spleen: Normal size. No mass. Adrenals/Urinary Tract: Normal adrenals. Normal kidneys with no hydronephrosis and no renal mass. Normal bladder. Stomach/Bowel: Grossly normal stomach. Normal caliber small bowel with no small bowel wall thickening. Normal appendix. Normal large bowel with no diverticulosis, large bowel wall thickening or pericolonic fat stranding. Vascular/Lymphatic: Nonaneurysmal abdominal aorta . Patent portal, splenic, hepatic and renal veins. Several enlarged left mesenteric lymph nodes up to 2.1 cm (series 2/ image 44), not appreciably changed since 03/05/2016. Reproductive: Normal size prostate . Other: No pneumoperitoneum.  No ascites.  No new fluid collections. Musculoskeletal: No aggressive appearing focal osseous lesions. Mild thoracolumbar spondylosis. IMPRESSION: 1. 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 No convincing findings of acute pancreatitis on this motion degraded scan. No new fluid collections. 2. Stable left mesenteric  lymphadenopathy back to 03/05/2016, nonspecific, suggestive of reactive adenopathy . 3. No evidence of bowel obstruction or acute bowel inflammation. 4. Diffuse hepatic steatosis. Electronically Signed   By: Delbert Phenix M.D.   On: 06/04/2017 17:02   Dg Chest Port 1 View  Result Date: 06/04/2017 CLINICAL DATA:  Generalized abdominal pain with nausea and vomiting. EXAM: PORTABLE CHEST 1 VIEW COMPARISON:  01/20/2016 FINDINGS: The heart size and mediastinal contours are within normal limits. Both lungs are clear. The visualized skeletal structures are unremarkable. IMPRESSION: No active disease. Electronically Signed   By: Elige Ko   On: 06/04/2017 17:08    Assessment and Plan:   1. Atrial flutter: Pt w/ hx of AF/Aflutter s/p PVI and CTI ablation 12/09/16 w/ Dr. Johney Frame, chronically managed with flecanide and prn diltiazem.  His current episode was not particularly symptomatic, but the ER had difficulty achieving rate control and started a dilt gtt.  On arrival to [facility], he spontaneously converted back to NSR.  I suspect that his current episode was triggered by his gastrointestinal illness as well as missing his normal rhythm control medications.  We will continue his current meds and watch him on telemetry overnight to ensure that he stays in NSR. 1. Discontinue diltiazem gtt 2. Continue metoprolol (had stopped as an outpatient, but would resume at this point given recurrence of AF), eliquis, and tikosyn 3. Monitor on telemetry overnight 2. Gastritis: Patient with recent travel to [place] and acute development of nausea and vomitting upon return.  Suspect viral gastritis as cause given unremarkable CT A/P.  Patient reports that his symptoms are currently better controlled, but has had relief with IV zofran.   1. IV Zofran prn for nausea 3. CHF: Pt w/ hx of HFrEF, now recovered on most recent echo. Suspect this is due to better rate control after ablation 1.  Continue rhythm control agents  above 4. Inpatient bundle 1. Cardiac diet 2. On eliquis for AC 3. Full code  Severity of Illness: The appropriate patient status for this patient is OBSERVATION. Observation status is judged to be reasonable and necessary in order to provide the required intensity of service to ensure the patient's safety. The patient's presenting symptoms, physical exam findings, and initial radiographic and laboratory data in the context of their medical condition is felt to place them at decreased risk for further clinical deterioration. Furthermore, it is anticipated that the patient will be medically stable for discharge from the hospital within 2 midnights of admission. The following factors support the patient status of observation.   " The patient's presenting symptoms include nausea, inability to take PO. " The physical exam findings include warm well perfused, no signs of heart failure. " The initial radiographic and laboratory data are ECG with normal sinus rhythm (previously Aflutter).     For questions or updates, please contact [facility] Please consult [email or web address] for contact info under Cardiology/STEMI.    Signed, Oliva Bustard, MD  06/04/2017 11:06 PM

## 2017-06-04 NOTE — ED Triage Notes (Signed)
Pt reports generalized abd pain with nausea/vomiting since this morning.

## 2017-06-04 NOTE — ED Notes (Signed)
Pt continues in CT 

## 2017-06-04 NOTE — ED Notes (Signed)
Pt in CT- this RN not notiied

## 2017-06-04 NOTE — ED Provider Notes (Signed)
[facility]-EMERGENCY DEPT Provider Note   CSN: [id number] Arrival date & time: 06/04/17  1326     History   Chief Complaint Chief Complaint  Patient presents with  . Abdominal Pain    HPI Nathaniel [name] is a 26 y.o. male.  Patient complains of vomiting. Patient had back from [place] last night and started having abdominal cramps today. He takes flecainide and Cardizem for atrial fib flutter and has had a ablation done back in April   The history is provided by the patient.  Abdominal Pain   This is a new problem. The current episode started 12 to 24 hours ago. The problem occurs constantly. The problem has not changed since onset.The pain is associated with an unknown factor. The pain is located in the epigastric region. The quality of the pain is dull. Associated symptoms include nausea. Pertinent negatives include diarrhea, frequency, hematuria and headaches.    Past Medical History:  Diagnosis Date  . Atrial flutter (HCC) 01/17/2016   Admx 5/17 with AFlutter with RVR in setting of pancreatitis // CHADS2-VASc=1 (CHF) // Anticoag not started // Amiodarone >> NSR   . Chronic lower back pain   . Chronic systolic CHF (congestive heart failure) (HCC)    Echo 01/17/16 - EF 35-40%, diffuse HK, mild LAE   . 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HCC) 02/03/2016   a. EF 35-40% in setting of AFlutter with RVR in 5/17 // probably tachycardia mediated // needs FU echo 2-3 mos after NSR restored // b. Limited Echo 7/17: EF 45-50%, diff HK, mild LAE  . Depression    "last 2-3 months; never treated for it" (12/09/2016)  . Fatty liver 12/2015   per CT  . Headache    "monthly" (12/09/2016)  . Obesity    BMI 45 12/2015  . Pancreatitis 2012   recurrent acute. Admissions to [facility], 12/2015 admissin to [facility] hospital, ct then shows pseudocyst.   . Pancreatitis 2012 X 2; 2017 X 2  . Paroxysmal atrial fibrillation [facility])     Patient Active Problem List   Diagnosis Date Noted  . Paroxysmal atrial fibrillation  (HCC) 12/09/2016  . A-fib (HCC) 12/09/2016  . Pancreatitis 03/05/2016  . Acute pancreatitis without infection or necrosis 03/05/2016  . Acute pancreatitis 03/05/2016  . 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HCC) 02/03/2016  . Chronic systolic CHF (congestive heart failure) (HCC)   . Pancreatic pseudocyst   . Morbid obesity due to excess calories (HCC)   . Alcohol abuse   . Dyslipidemia   . History of pancreatitis 01/17/2016  . Snoring 01/17/2016  . Former smoker 01/17/2016  . EtOH dependence (HCC) 01/17/2016  . Atrial flutter (HCC) 01/17/2016    Past Surgical History:  Procedure Laterality Date  . ATRIAL FIBRILLATION ABLATION N/A 12/09/2016   Procedure: Atrial Fibrillation Ablation;  Surgeon: Hillis Range, MD;  Location: [facility] INVASIVE CV LAB;  Service: Cardiovascular;  Laterality: N/A;  . FRACTURE SURGERY    . HAND SURGERY Right ~ 2008   "had pins in it; pulled them"  . TEE WITHOUT CARDIOVERSION N/A 12/08/2016   Procedure: TRANSESOPHAGEAL ECHOCARDIOGRAM (TEE);  Surgeon: Jake Bathe, MD;  Location: [facility] ENDOSCOPY;  Service: Cardiovascular;  Laterality: N/A;  . TONSILLECTOMY AND ADENOIDECTOMY     "in my teens"       Home Medications    Prior to Admission medications   Medication Sig Start Date End Date Taking? Authorizing Provider  diltiazem (CARDIZEM) 30 MG tablet Take 1 tablet every 4 hours AS NEEDED for rapid afib heart  rate >100 01/10/17  Yes Newman Nip, NP  ELIQUIS 5 MG TABS tablet TAKE 1 TABLET BY MOUTH TWICE DAILY 03/28/17  Yes Allred, Fayrene Fearing, MD  flecainide (TAMBOCOR) 100 MG tablet TAKE 1 TABLET(100 MG) BY MOUTH TWICE DAILY 01/25/17  Yes Newman Nip, NP  lisinopril (PRINIVIL,ZESTRIL) 5 MG tablet TAKE 1 TABLET(5 MG) BY MOUTH DAILY 03/07/17  Yes Duke Salvia, MD  ranitidine (ZANTAC) 150 MG capsule Take 150 mg by mouth 2 (two) times daily.   Yes [provider]  triamcinolone cream (KENALOG) 0.1 % Apply 1 application topically every evening.   Yes [provider]  metoprolol succinate (TOPROL-XL) 25 MG 24 hr tablet TAKE 1 TABLET(25 MG) BY MOUTH DAILY Patient not taking: Reported on 06/04/2017 05/05/17   Hillis Range, MD    Family History Family History  Problem Relation Age of Onset  . Diabetes Mother     Social History Social History  Substance Use Topics  . Smoking status: Former Smoker    Packs/day: 0.50    Types: Cigarettes  . Smokeless tobacco: Current User    Types: Snuff     Comment: 12/09/2016 "quit chewing May 2017"  . Alcohol use Yes     Comment: occasional     Allergies   Patient has no known allergies.   Review of Systems Review of Systems  Constitutional: Negative for appetite change and fatigue.  HENT: Negative for congestion, ear discharge and sinus pressure.   Eyes: Negative for discharge.  Respiratory: Negative for cough.   Cardiovascular: Negative for chest pain.  Gastrointestinal: Positive for abdominal pain and nausea. Negative for diarrhea.  Genitourinary: Negative for frequency and hematuria.  Musculoskeletal: Negative for back pain.  Skin: Negative for rash.  Neurological: Negative for seizures and headaches.  Psychiatric/Behavioral: Negative for hallucinations.     Physical Exam Updated Vital Signs BP 106/64   Pulse (!) 145   Temp 98.1 F (36.7 C) (Oral)   Resp 18   Ht 6' (1.829 m)   Wt 136.1 kg (300 lb)   SpO2 100%   BMI 40.69 kg/m   Physical Exam  Constitutional: He is oriented to person, place, and time. He appears well-developed.  HENT:  Head: Normocephalic.  Eyes: Conjunctivae and EOM are normal. No scleral icterus.  Neck: Neck supple. No thyromegaly present.  Cardiovascular: Exam reveals no gallop and no friction rub.   No murmur heard. Rapid rate  Pulmonary/Chest: No stridor. He has no wheezes. He has no rales. He exhibits no tenderness.  Abdominal: He exhibits no distension. There is tenderness. There is no rebound.  Musculoskeletal: Normal range of motion. He  exhibits no edema.  Lymphadenopathy:    He has no cervical adenopathy.  Neurological: He is oriented to person, place, and time. He exhibits normal muscle tone. Coordination normal.  Skin: No rash noted. No erythema.  Psychiatric: He has a normal mood and affect. His behavior is normal.     ED Treatments / Results  Labs (all labs ordered are listed, but only abnormal results are displayed) Labs Reviewed  COMPREHENSIVE METABOLIC PANEL - Abnormal; Notable for the following:       Result Value   Glucose, Bld 134 (*)    Calcium 8.6 (*)    All other components within normal limits  CBC - Abnormal; Notable for the following:    WBC 11.7 (*)    All other components within normal limits  LIPASE, BLOOD  URINALYSIS, ROUTINE W REFLEX MICROSCOPIC  TROPONIN  I    EKG  EKG Interpretation None       Radiology Ct Abdomen Pelvis W Contrast  Result Date: 06/04/2017 CLINICAL DATA:  Generalized abdominal pain and distention with nausea and vomiting since this morning. History of pancreatitis. EXAM: CT ABDOMEN AND PELVIS WITH CONTRAST TECHNIQUE: Multidetector CT imaging of the abdomen and pelvis was performed using the standard protocol following bolus administration of intravenous contrast. CONTRAST:  ISOVUE-300 IOPAMIDOL (ISOVUE-300) INJECTION 61% COMPARISON:  07/27/2016 MRI abdomen.  03/05/2016 CT abdomen/ pelvis. FINDINGS: Motion degraded scan. Lower chest: No significant pulmonary nodules or acute consolidative airspace disease. Hepatobiliary: Diffuse hepatic steatosis. No definite liver surface irregularity. No liver mass. Normal gallbladder with no radiopaque cholelithiasis. No biliary ductal dilatation. Pancreas: No pancreatic mass or duct dilation. Heterogeneous fatty infiltration throughout the pancreas. No pancreatic parenchymal calcifications. No significant acute peripancreatic fat stranding or fluid to suggest acute pancreatitis. Mildly uniformly thick-walled irregular 4.5 x 2.6 x  3.2 cm fluid collection at the anterior margin of the pancreatic head abutting the gastric antrum (series 2/image 23), previously 4.2 x 3.4 x 5.0 cm on 03/05/2016 using similar measurement technique, not significantly changed . No new peripancreatic fluid collections. Spleen: Normal size. No mass. Adrenals/Urinary Tract: Normal adrenals. Normal kidneys with no hydronephrosis and no renal mass. Normal bladder. Stomach/Bowel: Grossly normal stomach. Normal caliber small bowel with no small bowel wall thickening. Normal appendix. Normal large bowel with no diverticulosis, large bowel wall thickening or pericolonic fat stranding. Vascular/Lymphatic: Nonaneurysmal abdominal aorta . Patent portal, splenic, hepatic and renal veins. Several enlarged left mesenteric lymph nodes up to 2.1 cm (series 2/ image 44), not appreciably changed since 03/05/2016. Reproductive: Normal size prostate . Other: No pneumoperitoneum.  No ascites.  No new fluid collections. Musculoskeletal: No aggressive appearing focal osseous lesions. Mild thoracolumbar spondylosis. IMPRESSION: 1. 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 No convincing findings of acute pancreatitis on this motion degraded scan. No new fluid collections. 2. Stable left mesenteric lymphadenopathy back to 03/05/2016, nonspecific, suggestive of reactive adenopathy . 3. No evidence of bowel obstruction or acute bowel inflammation. 4. Diffuse hepatic steatosis. Electronically Signed   By: Delbert Phenix M.D.   On: 06/04/2017 17:02   Dg Chest Port 1 View  Result Date: 06/04/2017 CLINICAL DATA:  Generalized abdominal pain with nausea and vomiting. EXAM: PORTABLE CHEST 1 VIEW COMPARISON:  01/20/2016 FINDINGS: The heart size and mediastinal contours are within normal limits. Both lungs are clear. The visualized skeletal structures are unremarkable. IMPRESSION: No active disease. Electronically Signed   By: Elige Ko   On:  06/04/2017 17:08    Procedures Procedures (including critical care time)  Medications Ordered in ED Medications  diltiazem (CARDIZEM) 100 mg in dextrose 5% (1 mg/mL) infusion (10 mg/hr Intravenous Rate/Dose Change 06/04/17 1633)  apixaban (ELIQUIS) tablet 5 mg (not administered)  sodium chloride 0.9 % bolus 2,000 mL (1,000 mLs Intravenous New Bag/Given 06/04/17 1435)  pantoprazole (PROTONIX) injection 40 mg (40 mg Intravenous Given 06/04/17 1443)  ondansetron (ZOFRAN) injection 4 mg (4 mg Intravenous Given 06/04/17 1435)  diltiazem (CARDIZEM) injection 10 mg (10 mg Intravenous Given 06/04/17 1439)  flecainide (TAMBOCOR) tablet 100 mg (100 mg Oral Given 06/04/17 1504)  HYDROmorphone (DILAUDID) injection 0.5 mg (0.5 mg Intravenous Given 06/04/17 1505)  diltiazem (CARDIZEM) injection 10 mg (10 mg Intravenous Given 06/04/17 1507)  diltiazem (CARDIZEM) 100 mg in dextrose 5% (1 mg/mL) infusion (6 mg/hr Intravenous Rate/Dose Change 06/04/17 1520)  iopamidol (ISOVUE-300) 61 % injection 100 mL (100 mLs Intravenous Contrast Given 06/04/17 1616)     Initial Impression / Assessment and Plan / ED Course  I have reviewed the triage vital signs and the nursing notes.  Pertinent labs & imaging results that were available during my care of the patient were reviewed by me and considered in my medical decision making (see chart for details).    CRITICAL CARE Performed by: Kayson Bullis L Total critical care time: 45 minutes Critical care time was exclusive of separately billable procedures and treating other patients. Critical care was necessary to treat or prevent imminent or life-threatening deterioration. Critical care was time spent personally by me on the following activities: development of treatment plan with patient and/or surrogate as well as nursing, discussions with consultants, evaluation of patient's response to treatment, examination of patient, obtaining history from patient or  surrogate, ordering and performing treatments and interventions, ordering and review of laboratory studies, ordering and review of radiographic studies, pulse oximetry and re-evaluation of patient's condition.   Patient is evident symptoms improved with treatment. CT scan shows his pseudocyst. Suspect abdominal discomfort is related to viral infection. Patient also has rapid atrial flutter which has improved a small amount with Cardizem. She will be transferred to cardiology at [facility] for continued care  Final Clinical Impressions(s) / ED Diagnoses   Final diagnoses:  Atrial flutter, unspecified type [facility])    New Prescriptions New Prescriptions   No medications on file     Bethann Berkshire, MD 06/04/17 1755

## 2017-06-04 NOTE — ED Notes (Signed)
Dr Zammit in to reassess 

## 2017-06-04 NOTE — ED Notes (Signed)
Call to AC for med 

## 2017-06-04 NOTE — ED Notes (Signed)
Dr Zammit in to recheck 

## 2017-06-05 ENCOUNTER — Encounter (HOSPITAL_COMMUNITY): Payer: Self-pay | Admitting: *Deleted

## 2017-06-05 DIAGNOSIS — I480 Paroxysmal atrial fibrillation: Secondary | ICD-10-CM

## 2017-06-05 DIAGNOSIS — K529 Noninfective gastroenteritis and colitis, unspecified: Secondary | ICD-10-CM

## 2017-06-05 DIAGNOSIS — I483 Typical atrial flutter: Secondary | ICD-10-CM

## 2017-06-05 DIAGNOSIS — Z8679 Personal history of other diseases of the circulatory system: Secondary | ICD-10-CM

## 2017-06-05 DIAGNOSIS — E876 Hypokalemia: Secondary | ICD-10-CM

## 2017-06-05 DIAGNOSIS — I484 Atypical atrial flutter: Secondary | ICD-10-CM

## 2017-06-05 HISTORY — DX: Noninfective gastroenteritis and colitis, unspecified: K52.9

## 2017-06-05 LAB — CBC
HCT: 38.2 % — ABNORMAL LOW (ref 39.0–52.0)
Hemoglobin: 12.3 g/dL — ABNORMAL LOW (ref 13.0–17.0)
MCH: 28.7 pg (ref 26.0–34.0)
MCHC: 32.2 g/dL (ref 30.0–36.0)
MCV: 89 fL (ref 78.0–100.0)
Platelets: 212 10*3/uL (ref 150–400)
RBC: 4.29 MIL/uL (ref 4.22–5.81)
RDW: 13.3 % (ref 11.5–15.5)
WBC: 6 10*3/uL (ref 4.0–10.5)

## 2017-06-05 LAB — BASIC METABOLIC PANEL
Anion gap: 8 (ref 5–15)
BUN: 10 mg/dL (ref 6–20)
CO2: 25 mmol/L (ref 22–32)
Calcium: 7.5 mg/dL — ABNORMAL LOW (ref 8.9–10.3)
Chloride: 103 mmol/L (ref 101–111)
Creatinine, Ser: 0.73 mg/dL (ref 0.61–1.24)
GFR calc Af Amer: >60 mL/min (ref >60)
GFR calc non Af Amer: >60 mL/min (ref >60)
Glucose, Bld: 106 mg/dL — ABNORMAL HIGH (ref 65–99)
Potassium: 3.1 mmol/L — ABNORMAL LOW (ref 3.5–5.1)
Sodium: 136 mmol/L (ref 135–145)

## 2017-06-05 LAB — POTASSIUM: Potassium: 3.4 mmol/L — ABNORMAL LOW (ref 3.5–5.1)

## 2017-06-05 LAB — MAGNESIUM: Magnesium: 1.6 mg/dL — ABNORMAL LOW (ref 1.7–2.4)

## 2017-06-05 LAB — HIV ANTIBODY (ROUTINE TESTING W REFLEX): HIV Screen 4th Generation wRfx: NONREACTIVE

## 2017-06-05 MED ORDER — POTASSIUM CHLORIDE CRYS ER 20 MEQ PO TBCR: 20.0000 meq | EXTENDED_RELEASE_TABLET | Freq: Every day | ORAL | 1 refills | Status: DC

## 2017-06-05 MED ORDER — POTASSIUM CHLORIDE CRYS ER 20 MEQ PO TBCR: 20.0000 meq | EXTENDED_RELEASE_TABLET | Freq: Every day | ORAL | Status: DC

## 2017-06-05 MED ORDER — POTASSIUM CHLORIDE CRYS ER 20 MEQ PO TBCR
40.0000 meq | EXTENDED_RELEASE_TABLET | Freq: Once | ORAL | Status: AC
  Administered 2017-06-05: 40 meq via ORAL
  Filled 2017-06-05: qty 2

## 2017-06-05 MED ORDER — MAGNESIUM OXIDE 400 (241.3 MG) MG PO TABS: 400.0000 mg | ORAL_TABLET | Freq: Every day | ORAL | 1 refills | Status: DC

## 2017-06-05 MED ORDER — MAGNESIUM OXIDE 400 (241.3 MG) MG PO TABS: 400.0000 mg | ORAL_TABLET | Freq: Every day | ORAL | Status: DC

## 2017-06-05 MED ORDER — MAGNESIUM SULFATE 2 GM/50ML IV SOLN
2.0000 g | Freq: Once | INTRAVENOUS | Status: AC
  Administered 2017-06-05: 2 g via INTRAVENOUS
  Filled 2017-06-05: qty 50

## 2017-06-05 NOTE — Discharge Summary (Signed)
Discharge Summary    Patient ID: Nathaniel [name],  MRN: [id number], DOB/AGE: [birth year] 27 y.o.  Admit date: 06/04/2017 Discharge date: 06/05/2017  Primary Care Provider: Patient, No Pcp Per Primary Cardiologist: Klein/Allred  Discharge Diagnoses    Principal Problem:   Atrial flutter (HCC) Active Problems:   Morbid obesity due to excess calories (HCC)   Gastroenteritis   History of cardiomyopathy   Hypokalemia   Hypomagnesemia  Diagnostic Studies/Procedures    N/A _____________     History of Present Illness     Nathaniel [name] is a 26 y.o. male with history of  AF/Aflutter (s/p ablation 11/2016), proior systolic CHF (EF 40-98%, now recovered, thought to be tachycardia induced), pancreatitis (w/ known psuedocyst), fatty liver, depression, morbid obesity who presented to med center [facility] w/ Aflutter w/ RVR. He just got back from his honeymoon in [place] 2 nights ago. Apparently on vacation he ate ice from [place]. He woke up yesterday morning with knots in his stomach and vomiting. He couldn't keep his medications down. Given his hx of pancreatitis, he went to the ER for further evaluation.  There he was noted to be in recurrent atrial flutter with HRs in the 160s. He had a CT A/P that showed his known chronic pancreatitis psuedocyst, but no other acute pathology.  Given his RVR he was started on a diltiazem gtt and the ER requested to transfer to [facility]. He did report that he's had frequent episodes of AF w/ RVR and typically takes dilitazem as needed to break them. He states that his last dose was on the plane ride back from [place] a 2 nights ago and his rhythm broke like it normally does. Per fellow note, he had been compliant with his medications and his only missed doses were the morning when he couldn't keep anything down, but upon discussion today with Dr. Ladona Ridgel he said the patient had not taken his flecainide with him on his honeymoon. Due to persistent symptoms the patient  came to the ER yesterday evening. His wife did not have any GI symptoms.   Hospital Course    1. Atrial fib and atrial flutter with RVR - He was admitted for further evaluation and management. He is been placed back on his flecainide, and returned to sinus rhythm. His abdominal symptoms resolved with supportive care. He denies fevers or chills. He has not had syncope. He was seen by EP Dr. Ladona Ridgel who suspected this was a combination of GI illness along with missing flecainide which resulted in a recurrence of his atrial arrhythmias. He recommended continuation of flecainide with no changes to medication regimen. He was observed for several hours to ensure no recurrent AF. He did have 5 minutes but this was self-limited and asymptomatic. Per our discussion Dr. Ladona Ridgel felt he was OK for discharge. The patient will see atrial fib clinic back for early follow-up - sent message to clinic nurse to arrange f/u within 3-4 days of dc.   2. GI illness - care team suspected mild food poisoning with probable self-limiting course. Supportive care advised. Was advised to f/u with PCP/GI early this week.  3. History of tachy-induced cardiomyopathy - has had no heart failure symptoms. Dr. Ladona Ridgel recommended continued therapy.  4. Hypokalemia/hypomagnesemia - suspect due to GI illness. Potassium was 3.9 on admission, falling to 3.1 today. Recheck K was 3.4 shortly after getting KCl. Per d/w Dr. Ladona Ridgel will give another now and then send home on potassium. Historical potassium was 3.9  in April. His Mg was 1.6. Received 2G mag sulfate - will send home on MagOx  daily.  When seen back clinic, would recommend f/u Mg and BMET to ensure stabilized. He may or may not require longterm continuation of these lyte supplements as it is unclear if this was related to acute GIB. Regardless, historical potassium has been <4 and magnesium 1.8 so may be prudent to continue. I included the request for this labwork in my  message to afib clinic.  5. Morbid obesity - has had prior counseling for this. Not clear to me that sleep study was ever completed as OP, should try to obtain records in outpatient f/u.  6. Mild anemia - initial Hgb was 15.5 but suspect concentrated related to vomiting as this value was similar to prior 12-13.  He currently feels "great" - no further GI symptoms or palpitations. Dr. Ladona Ridgel has seen and examined the patient today and feels he is stable for discharge. _____________  Discharge Vitals Blood pressure 107/74, pulse 73, temperature 97.9 F (36.6 C), temperature source Oral, resp. rate 16, height 6' (1.829 m), weight (!) 322 lb 12.8 oz (146.4 kg), SpO2 100 %.  Filed Weights   06/04/17 1336 06/04/17 2120 06/05/17 0446  Weight: 300 lb (136.1 kg) (!) 323 lb 9.6 oz (146.8 kg) (!) 322 lb 12.8 oz (146.4 kg)    Labs & Radiologic Studies    CBC  Recent Labs  06/04/17 1352 06/05/17 0526  WBC 11.7* 6.0  HGB 15.5 12.3*  HCT 46.9 38.2*  MCV 89.2 89.0  PLT 264 212   Basic Metabolic Panel  Recent Labs  06/04/17 1352 06/05/17 0526 06/05/17 1421  NA 138 136  --   K 3.9 3.1* 3.4*  CL 105 103  --   CO2 23 25  --   GLUCOSE 134* 106*  --   BUN 17 10  --   CREATININE 0.75 0.73  --   CALCIUM 8.6* 7.5*  --   MG  --  1.6*  --    Liver Function Tests  Recent Labs  06/04/17 1352  AST 33  ALT 45  ALKPHOS 42  BILITOT 1.0  PROT 7.7  ALBUMIN 4.0    Recent Labs  06/04/17 1352  LIPASE 22   Cardiac Enzymes  Recent Labs  06/04/17 1653  TROPONINI <0.03   BNP Invalid input(s): POCBNP D-Dimer No results for input(s): DDIMER in the last 72 hours. Hemoglobin A1C No results for input(s): HGBA1C in the last 72 hours. Fasting Lipid Panel No results for input(s): CHOL, HDL, LDLCALC, TRIG, CHOLHDL, LDLDIRECT in the last 72 hours. Thyroid Function Tests No results for input(s): TSH, T4TOTAL, T3FREE, THYROIDAB in the last 72 hours.  Invalid input(s):  FREET3 _____________  Ct Abdomen Pelvis W Contrast  Result Date: 06/04/2017 CLINICAL DATA:  Generalized abdominal pain and distention with nausea and vomiting since this morning. History of pancreatitis. EXAM: CT ABDOMEN AND PELVIS WITH CONTRAST TECHNIQUE: Multidetector CT imaging of the abdomen and pelvis was performed using the standard protocol following bolus administration of intravenous contrast. CONTRAST:  ISOVUE-300 IOPAMIDOL (ISOVUE-300) INJECTION 61% COMPARISON:  07/27/2016 MRI abdomen.  03/05/2016 CT abdomen/ pelvis. FINDINGS: Motion degraded scan. Lower chest: No significant pulmonary nodules or acute consolidative airspace disease. Hepatobiliary: Diffuse hepatic steatosis. No definite liver surface irregularity. No liver mass. Normal gallbladder with no radiopaque cholelithiasis. No biliary ductal dilatation. Pancreas: No pancreatic mass or duct dilation. Heterogeneous fatty infiltration throughout the pancreas. No pancreatic parenchymal calcifications. No  significant acute peripancreatic fat stranding or fluid to suggest acute pancreatitis. Mildly uniformly thick-walled irregular 4.5 x 2.6 x 3.2 cm fluid collection at the anterior margin of the pancreatic head abutting the gastric antrum (series 2/image 23), previously 4.2 x 3.4 x 5.0 cm on 03/05/2016 using similar measurement technique, not significantly changed . No new peripancreatic fluid collections. Spleen: Normal size. No mass. Adrenals/Urinary Tract: Normal adrenals. Normal kidneys with no hydronephrosis and no renal mass. Normal bladder. Stomach/Bowel: Grossly normal stomach. Normal caliber small bowel with no small bowel wall thickening. Normal appendix. Normal large bowel with no diverticulosis, large bowel wall thickening or pericolonic fat stranding. Vascular/Lymphatic: Nonaneurysmal abdominal aorta . Patent portal, splenic, hepatic and renal veins. Several enlarged left mesenteric lymph nodes up to 2.1 cm (series 2/ image 44),  not appreciably changed since 03/05/2016. Reproductive: Normal size prostate . Other: No pneumoperitoneum.  No ascites.  No new fluid collections. Musculoskeletal: No aggressive appearing focal osseous lesions. Mild thoracolumbar spondylosis. IMPRESSION: 1. 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 No convincing findings of acute pancreatitis on this motion degraded scan. No new fluid collections. 2. Stable left mesenteric lymphadenopathy back to 03/05/2016, nonspecific, suggestive of reactive adenopathy . 3. No evidence of bowel obstruction or acute bowel inflammation. 4. Diffuse hepatic steatosis. Electronically Signed   By: Delbert Phenix M.D.   On: 06/04/2017 17:02   Dg Chest Port 1 View  Result Date: 06/04/2017 CLINICAL DATA:  Generalized abdominal pain with nausea and vomiting. EXAM: PORTABLE CHEST 1 VIEW COMPARISON:  01/20/2016 FINDINGS: The heart size and mediastinal contours are within normal limits. Both lungs are clear. The visualized skeletal structures are unremarkable. IMPRESSION: No active disease. Electronically Signed   By: Elige Ko   On: 06/04/2017 17:08   Disposition   Pt is being discharged home today in good condition.  Follow-up Plans & Appointments    Follow-up Information    Primary Care Provider Follow up.   Why:  Please follow up with primary care or GI early this week to follow up on your gastrointestinal illness.       Newman Nip, NP Follow up.   Specialties:  Nurse Practitioner, Cardiology Why:  Office will call you for a follow-up appointment. Please call the office if you have not heard from the atrial fib clinic within 2 days of discharge. Contact information: [address] [place] [address] [phone]          Discharge Instructions    Diet - low sodium heart healthy    Complete by:  As directed    Increase activity slowly    Complete by:  As directed    You will start your  potassium and magnesium prescriptions tomorrow.      Discharge Medications   Allergies as of 06/05/2017   No Known Allergies     Medication List    TAKE these medications   diltiazem 30 MG tablet Commonly known as:  CARDIZEM Take 1 tablet every 4 hours AS NEEDED for rapid afib heart rate >100   ELIQUIS 5 MG Tabs tablet Generic drug:  apixaban TAKE 1 TABLET BY MOUTH TWICE DAILY   flecainide 100 MG tablet Commonly known as:  TAMBOCOR TAKE 1 TABLET(100 MG) BY MOUTH TWICE DAILY   lisinopril 5 MG tablet Commonly known as:  PRINIVIL,ZESTRIL TAKE 1 TABLET(5 MG) BY MOUTH DAILY   magnesium oxide 400 (241.3 Mg) MG tablet Commonly known as:  MAG-OX Take 1  tablet (400 mg total) by mouth daily.   metoprolol succinate 25 MG 24 hr tablet Commonly known as:  TOPROL-XL TAKE 1 TABLET(25 MG) BY MOUTH DAILY   potassium chloride SA 20 MEQ tablet Commonly known as:  K-DUR,KLOR-CON Take 1 tablet (20 mEq total) by mouth daily.   ranitidine 150 MG capsule Commonly known as:  ZANTAC Take 150 mg by mouth 2 (two) times daily.   triamcinolone cream 0.1 % Commonly known as:  KENALOG Apply 1 application topically every evening.        Allergies:  No Known Allergies   Outstanding Labs/Studies   Will need BMET and MG as OP  Duration of Discharge Encounter   Greater than 30 minutes including physician time.  Signed, Laurann Montana PA-C 06/05/2017, 3:42 PM   EP Attending  Patient seen and examined. Agree with above. He is stable for DC home. Followup with Rudi Coco and Hillis Range. He is encouraged to be compliant with his flecainided.  Leonia Reeves.D.

## 2017-06-05 NOTE — Consult Note (Signed)
Cardiology Consultation:   Patient ID: Nathaniel [name]; [id number]; 03/28/1991   Admit date: 06/04/2017 Date of Consult: 06/05/2017  Primary Care Provider: Patient, No Pcp Per Primary Cardiologist: Graciela Husbands Primary Electrophysiologist:  Allred   Patient Profile:   Nathaniel [name] is a 26 y.o. male with a hx of atrial fib and flutter, s/p ablation who is being seen today for the evaluation of atrial fib and flutter at the request of Dr. Eden Emms.  History of Present Illness:   Nathaniel [name] is a 26 year old morbidly obese man with a history of tachycardia induced cardiomyopathy, as well as pancreatitis, who had been placed back on flecainide after undergoing catheter ablation approximately 6 months ago. For the most part, his arrhythmias have been well-controlled. The patient recently got married and was on his honeymoon. On returning home, he noted recurrent symptoms, including palpitations, and vomiting and abdominal discomfort. Apparently on vacation he ate ice from [place]. On presentation to the emergency room, the patient was noted to be in atrial flutter with a rapid ventricular response. He is been placed back on his flecainide, and has returned to sinus rhythm. His abdominal symptoms have resolved. He denies fevers or chills. He has not had syncope.  Past Medical History:  Diagnosis Date  . Atrial flutter (HCC) 01/17/2016   Admx 5/17 with AFlutter with RVR in setting of pancreatitis // CHADS2-VASc=1 (CHF) // Anticoag not started // Amiodarone >> NSR   . Chronic lower back pain   . Chronic systolic CHF (congestive heart failure) (HCC)    Echo 01/17/16 - EF 35-40%, diffuse HK, mild LAE   . 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HCC) 02/03/2016   a. EF 35-40% in setting of AFlutter with RVR in 5/17 // probably tachycardia mediated // needs FU echo 2-3 mos after NSR restored // b. Limited Echo 7/17: EF 45-50%, diff HK, mild LAE  . Depression    "last 2-3 months; never treated for it" (12/09/2016)    . Fatty liver 12/2015   per CT  . Headache    "monthly" (12/09/2016)  . Obesity    BMI 45 12/2015  . Pancreatitis 2012   recurrent acute. Admissions to [facility], 12/2015 admissin to [facility] hospital, ct then shows pseudocyst.   . Pancreatitis 2012 X 2; 2017 X 2  . Paroxysmal atrial fibrillation [facility])     Past Surgical History:  Procedure Laterality Date  . ATRIAL FIBRILLATION ABLATION N/A 12/09/2016   Procedure: Atrial Fibrillation Ablation;  Surgeon: Hillis Range, MD;  Location: [facility] INVASIVE CV LAB;  Service: Cardiovascular;  Laterality: N/A;  . FRACTURE SURGERY    . HAND SURGERY Right ~ 2008   "had pins in it; pulled them"  . TEE WITHOUT CARDIOVERSION N/A 12/08/2016   Procedure: TRANSESOPHAGEAL ECHOCARDIOGRAM (TEE);  Surgeon: Jake Bathe, MD;  Location: [facility] ENDOSCOPY;  Service: Cardiovascular;  Laterality: N/A;  . TONSILLECTOMY AND ADENOIDECTOMY     "in my teens"      Inpatient Medications: Scheduled Meds: . apixaban  5 mg Oral BID  . flecainide  100 mg Oral Q12H  . lisinopril  5 mg Oral Daily  . metoprolol succinate  25 mg Oral Daily   Continuous Infusions:  PRN Meds: acetaminophen, ondansetron (ZOFRAN) IV  Allergies:   No Known Allergies  Social History:   Social History   Social History  . Marital status: Single    Spouse name: N/A  . Number of children: 0  . Years of education: 12   Occupational History  .  [employer]  Social History Main Topics  . Smoking status: Former Smoker    Packs/day: 0.50    Types: Cigarettes  . Smokeless tobacco: Current User    Types: Snuff     Comment: 12/09/2016 "quit chewing May 2017"  . Alcohol use Yes     Comment: occasional  . Drug use: Yes    Types: Marijuana     Comment: 12/09/2016 "daily from age 1 to 11"  . Sexual activity: Yes    Birth control/ protection: None   Other Topics Concern  . Not on file   Social History Narrative   Pt lives in [place] with fiancee.  Works at [employer] in [place].    Family  History:    Family History  Problem Relation Age of Onset  . Diabetes Mother      ROS:  Please see the history of present illness.  ROS  All other ROS reviewed and negative.     Physical Exam/Data:   Vitals:   06/04/17 2120 06/05/17 0002 06/05/17 0446 06/05/17 1017  BP: 116/73 (!) 91/47 115/61 108/66  Pulse: (!) 159 88 95 80  Resp: (!) Temp: (!) 100.9 F (38.3 C) 98 F (36.7 C) 99.6 F (37.6 C)   TempSrc: Oral Oral Oral   SpO2: 97% 100% 96% 97%  Weight: (!) 323 lb 9.6 oz (146.8 kg)  (!) 322 lb 12.8 oz (146.4 kg)   Height: 6' (1.829 m)       Intake/Output Summary (Last 24 hours) at 06/05/17 1050 Last data filed at 06/05/17 0952  Gross per 24 hour  Intake           298.88 ml  Output              300 ml  Net            -1.12 ml   Filed Weights   06/04/17 1336 06/04/17 2120 06/05/17 0446  Weight: 300 lb (136.1 kg) (!) 323 lb 9.6 oz (146.8 kg) (!) 322 lb 12.8 oz (146.4 kg)   Body mass index is 43.78 kg/m.  General:  Well nourished, well developed, Obese young man, in no acute distress HEENT: normal Lymph: no adenopathy Neck: no JVD Endocrine:  No thryomegaly Cardiac:  normal S1, S2; RRR; no murmur  Lungs:  clear to auscultation bilaterally, no wheezing, rhonchi or rales  Abd: soft, obese, nontender, no hepatomegaly  Ext: no edema Musculoskeletal:  No deformities, BUE and BLE strength normal and equal Skin: warm and dry  Neuro:  CNs 2-12 intact, no focal abnormalities noted Psych:  Normal affect   EKG:  The EKG was personally reviewed and demonstrates:  Atrial flutter with a rapid ventricular response Telemetry:  Telemetry was personally reviewed and demonstrates:  Normal sinus rhythm, previously atrial fibrillation and flutter with a rapid ventricular response  Relevant CV Studies: None  Laboratory Data:  Chemistry Recent Labs Lab 06/04/17 1352 06/05/17 0526  NA 138 136  K 3.9 3.1*  CL 105 103  CO2 23 25  GLUCOSE 134* 106*  BUN 17 10    CREATININE 0.75 0.73  CALCIUM 8.6* 7.5*  GFRNONAA >60 >60  GFRAA >60 >60  ANIONGAP 10 8     Recent Labs Lab 06/04/17 1352  PROT 7.7  ALBUMIN 4.0  AST 33  ALT 45  ALKPHOS 42  BILITOT 1.0   Hematology Recent Labs Lab 06/04/17 1352 06/05/17 0526  WBC 11.7* 6.0  RBC 5.26 4.29  HGB 15.5 12.3*  HCT 46.9 38.2*  MCV 89.2 89.0  MCH 29.5 28.7  MCHC 33.0 32.2  RDW 12.9 13.3  PLT 264 212   Cardiac Enzymes Recent Labs Lab 06/04/17 1653  TROPONINI <0.03   No results for input(s): TROPIPOC in the last 168 hours.  BNPNo results for input(s): BNP, PROBNP in the last 168 hours.  DDimer No results for input(s): DDIMER in the last 168 hours.  Radiology/Studies:  Ct Abdomen Pelvis W Contrast  Result Date: 06/04/2017 CLINICAL DATA:  Generalized abdominal pain and distention with nausea and vomiting since this morning. History of pancreatitis. EXAM: CT ABDOMEN AND PELVIS WITH CONTRAST TECHNIQUE: Multidetector CT imaging of the abdomen and pelvis was performed using the standard protocol following bolus administration of intravenous contrast. CONTRAST:  ISOVUE-300 IOPAMIDOL (ISOVUE-300) INJECTION 61% COMPARISON:  07/27/2016 MRI abdomen.  03/05/2016 CT abdomen/ pelvis. FINDINGS: Motion degraded scan. Lower chest: No significant pulmonary nodules or acute consolidative airspace disease. Hepatobiliary: Diffuse hepatic steatosis. No definite liver surface irregularity. No liver mass. Normal gallbladder with no radiopaque cholelithiasis. No biliary ductal dilatation. Pancreas: No pancreatic mass or duct dilation. Heterogeneous fatty infiltration throughout the pancreas. No pancreatic parenchymal calcifications. No significant acute peripancreatic fat stranding or fluid to suggest acute pancreatitis. Mildly uniformly thick-walled irregular 4.5 x 2.6 x 3.2 cm fluid collection at the anterior margin of the pancreatic head abutting the gastric antrum (series 2/image 23), previously 4.2 x 3.4 x 5.0  cm on 03/05/2016 using similar measurement technique, not significantly changed . No new peripancreatic fluid collections. Spleen: Normal size. No mass. Adrenals/Urinary Tract: Normal adrenals. Normal kidneys with no hydronephrosis and no renal mass. Normal bladder. Stomach/Bowel: Grossly normal stomach. Normal caliber small bowel with no small bowel wall thickening. Normal appendix. Normal large bowel with no diverticulosis, large bowel wall thickening or pericolonic fat stranding. Vascular/Lymphatic: Nonaneurysmal abdominal aorta . Patent portal, splenic, hepatic and renal veins. Several enlarged left mesenteric lymph nodes up to 2.1 cm (series 2/ image 44), not appreciably changed since 03/05/2016. Reproductive: Normal size prostate . Other: No pneumoperitoneum.  No ascites.  No new fluid collections. Musculoskeletal: No aggressive appearing focal osseous lesions. Mild thoracolumbar spondylosis. IMPRESSION: 1. 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 No convincing findings of acute pancreatitis on this motion degraded scan. No new fluid collections. 2. Stable left mesenteric lymphadenopathy back to 03/05/2016, nonspecific, suggestive of reactive adenopathy . 3. No evidence of bowel obstruction or acute bowel inflammation. 4. Diffuse hepatic steatosis. Electronically Signed   By: Delbert Phenix M.D.   On: 06/04/2017 17:02   Dg Chest Port 1 View  Result Date: 06/04/2017 CLINICAL DATA:  Generalized abdominal pain with nausea and vomiting. EXAM: PORTABLE CHEST 1 VIEW COMPARISON:  01/20/2016 FINDINGS: The heart size and mediastinal contours are within normal limits. Both lungs are clear. The visualized skeletal structures are unremarkable. IMPRESSION: No active disease. Electronically Signed   By: Elige Ko   On: 06/04/2017 17:08    Assessment and Plan:   1. Atrial fibrillation and flutter with a rapid ventricular response - I suspect the combination  of a GI illness along with noncompliance with his flecainide resulted in recurrence of his atrial arrhythmias. He now feels well and is back to sinus rhythm. I recommended that he be observed in the hospital until later this afternoon. If he continues to maintain sinus rhythm, he will be allowed to be discharged home with the admonition not to miss any of his medications. 2. GI  illness - I suspect he has a mild case of food poisoning which appears to be resolving. This should be a self-limited illness. 3. Tachycardia induced cardiomyopathy - he has no heart failure symptoms. He will continue his current medical therapy   For questions or updates, please contact [facility] Please consult [email or web address] for contact info under Cardiology/STEMI.   Signed, Lewayne Bunting, MD  06/05/2017 10:50 AM

## 2017-06-05 NOTE — Progress Notes (Signed)
Discharge instructions reviewed with patient and wife, questions answered, verbalized understanding.  Patient ambulatory from unit accompanied by wife.

## 2017-06-06 DIAGNOSIS — I483 Typical atrial flutter: Secondary | ICD-10-CM

## 2017-06-06 HISTORY — DX: Typical atrial flutter: I48.3

## 2017-06-13 ENCOUNTER — Ambulatory Visit (HOSPITAL_COMMUNITY): Payer: BLUE CROSS/BLUE SHIELD | Admitting: Nurse Practitioner

## 2017-06-26 ENCOUNTER — Other Ambulatory Visit (HOSPITAL_COMMUNITY): Payer: Self-pay | Admitting: Nurse Practitioner

## 2017-07-08 ENCOUNTER — Encounter: Payer: Self-pay | Admitting: Internal Medicine

## 2017-07-08 ENCOUNTER — Ambulatory Visit: Payer: BLUE CROSS/BLUE SHIELD | Admitting: Internal Medicine

## 2017-07-08 VITALS — BP 114/70 | HR 74 | Ht 72.0 in | Wt 328.6 lb

## 2017-07-08 DIAGNOSIS — I480 Paroxysmal atrial fibrillation: Secondary | ICD-10-CM | POA: Diagnosis not present

## 2017-07-08 DIAGNOSIS — I5022 Chronic systolic (congestive) heart failure: Secondary | ICD-10-CM

## 2017-07-08 DIAGNOSIS — I420 Dilated cardiomyopathy: Secondary | ICD-10-CM | POA: Diagnosis not present

## 2017-07-08 NOTE — Progress Notes (Signed)
PCP: Patient, No Pcp Per  Nathaniel [name] is a 26 y.o. male who presents today for routine electrophysiology followup.  Since last being seen in our clinic, the patient reports doing very well.  afib is well controlled. He did have an episode of atrial flutter 06/06/17 after his honey money.  He had been to [place] and had a GI virus/ dehydration at that time.  Feels that he has since done well.  No progress with weight loss.   Today, he denies symptoms of palpitations, chest pain, shortness of breath,  lower extremity edema, dizziness, presyncope, or syncope.  The patient is otherwise without complaint today.   Past Medical History:  Diagnosis Date  . Atrial flutter (HCC) 01/17/2016   Admx 5/17 with AFlutter with RVR in setting of pancreatitis // CHADS2-VASc=1 (CHF) // Anticoag not started // Amiodarone >> NSR   . Chronic lower back pain   . Chronic systolic CHF (congestive heart failure) (HCC)    Echo 01/17/16 - EF 35-40%, diffuse HK, mild LAE   . 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HCC) 02/03/2016   a. EF 35-40% in setting of AFlutter with RVR in 5/17 // probably tachycardia mediated // needs FU echo 2-3 mos after NSR restored // b. Limited Echo 7/17: EF 45-50%, diff HK, mild LAE  . Depression    "last 2-3 months; never treated for it" (12/09/2016)  . Fatty liver 12/2015   per CT  . Headache    "monthly" (12/09/2016)  . Obesity    BMI 45 12/2015  . Pancreatitis 2012   recurrent acute. Admissions to [facility], 12/2015 admissin to [facility] hospital, ct then shows pseudocyst.   . Pancreatitis 2012 X 2; 2017 X 2  . Paroxysmal atrial fibrillation [facility])    Past Surgical History:  Procedure Laterality Date  . FRACTURE SURGERY    . HAND SURGERY Right ~ 2008   "had pins in it; pulled them"  . TONSILLECTOMY AND ADENOIDECTOMY     "in my teens"    ROS- all systems are reviewed and negatives except as per HPI above  Current Outpatient Medications  Medication Sig Dispense Refill  . diltiazem (CARDIZEM) 30 MG  tablet Take 1 tablet every 4 hours AS NEEDED for rapid afib heart rate >100 30 tablet 2  . ELIQUIS 5 MG TABS tablet TAKE 1 TABLET BY MOUTH TWICE DAILY 60 tablet 6  . flecainide (TAMBOCOR) 100 MG tablet TAKE 1 TABLET(100 MG) BY MOUTH TWICE DAILY 60 tablet 2  . lisinopril (PRINIVIL,ZESTRIL) 5 MG tablet TAKE 1 TABLET(5 MG) BY MOUTH DAILY 90 tablet 3  . magnesium oxide (MAG-OX) 400 (241.3 Mg) MG tablet Take 1 tablet (400 mg total) by mouth daily. 30 tablet 1  . metoprolol succinate (TOPROL-XL) 25 MG 24 hr tablet TAKE 1 TABLET(25 MG) BY MOUTH DAILY 90 tablet 2  . potassium chloride SA (K-DUR,KLOR-CON) 20 MEQ tablet Take 1 tablet (20 mEq total) by mouth daily. 30 tablet 1  . ranitidine (ZANTAC) 150 MG capsule Take 150 mg by mouth 2 (two) times daily.    [name] triamcinolone cream (KENALOG) 0.1 % Apply 1 application topically every evening.     No current facility-administered medications for this visit.     Physical Exam: Vitals:   07/08/17 0935  BP: 114/70  Pulse: 74  SpO2: 97%  Weight: (!) 149.1 kg (328 lb 9.6 oz)  Height: 6' (1.829 m)    GEN- The patient is well appearing, alert and oriented x 3 today.   Head- normocephalic,  atraumatic Eyes-  Sclera clear, conjunctiva pink Ears- hearing intact Oropharynx- clear Lungs- Clear to ausculation bilaterally, normal work of breathing Heart- Regular rate and rhythm, no murmurs, rubs or gallops, PMI not laterally displaced GI- soft, NT, ND, + BS Extremities- no clubbing, cyanosis, or edema   Assessment and Plan:  1. paroxsymal atrial fibrillation/ atrial flutter Doing well s/p ablation Lifestyle modification encouraged If atrial flutter/ afib return would consider repeat ablation No medicine changes at this time. He is back on eliquis Consider stopping eliquis on return.  Cbc, bmet today  2. Obesity Body mass index is 44.57 kg/m. Unfortunately, he has gained rather than lost weight Discussed at length  3. nonischemic CM EF 04/13/17 is  reviewed EF has recovered with sinus rhythm!  4. Hypomagnesemia/ hypokalemia In the setting of GI illness/ dehydration in October Repeat bmet, mg today If K and Mg are normal, hopefully we can stop supplementation  Return to see me in 3 months  Hillis RangeJames Laylia Mui MD, [facility] LtdFACC 07/08/2017 9:39 AM

## 2017-07-08 NOTE — Patient Instructions (Signed)
Medication Instructions:  Continue all current medications.  Labwork:  CBC, Magnesium, BMET - orders given today.  Office will contact with results via phone or letter.    Testing/Procedures: none  Follow-Up: 3 months   Any Other Special Instructions Will Be Listed Below (If Applicable).  If you need a refill on your cardiac medications before your next appointment, please call your pharmacy.

## 2017-07-13 ENCOUNTER — Telehealth: Payer: Self-pay | Admitting: *Deleted

## 2017-07-13 NOTE — Telephone Encounter (Signed)
Notes recorded by Lesle Chris, LPN on 07/86/7544 at 2:01 PM EST Patient notified. No pmd listed. ------  Notes recorded by Hillis Range, MD on 07/11/2017 at 5:37 PM EST Results reviewed. Please inform pt of result.

## 2017-07-26 ENCOUNTER — Encounter (HOSPITAL_COMMUNITY): Payer: Self-pay | Admitting: Emergency Medicine

## 2017-07-26 ENCOUNTER — Emergency Department (HOSPITAL_COMMUNITY)
Admission: EM | Admit: 2017-07-26 | Discharge: 2017-07-26 | Disposition: A | Discharge status: Home / Self Care | Payer: BLUE CROSS/BLUE SHIELD | Attending: Emergency Medicine | Admitting: Emergency Medicine

## 2017-07-26 ENCOUNTER — Other Ambulatory Visit: Payer: Self-pay

## 2017-07-26 DIAGNOSIS — Z7901 Long term (current) use of anticoagulants: Secondary | ICD-10-CM | POA: Insufficient documentation

## 2017-07-26 DIAGNOSIS — F1210 Cannabis abuse, uncomplicated: Secondary | ICD-10-CM | POA: Insufficient documentation

## 2017-07-26 DIAGNOSIS — F17210 Nicotine dependence, cigarettes, uncomplicated: Secondary | ICD-10-CM | POA: Diagnosis not present

## 2017-07-26 DIAGNOSIS — R1012 Left upper quadrant pain: Secondary | ICD-10-CM | POA: Diagnosis present

## 2017-07-26 DIAGNOSIS — Z79899 Other long term (current) drug therapy: Secondary | ICD-10-CM | POA: Insufficient documentation

## 2017-07-26 DIAGNOSIS — K859 Acute pancreatitis without necrosis or infection, unspecified: Secondary | ICD-10-CM | POA: Diagnosis not present

## 2017-07-26 DIAGNOSIS — I5022 Chronic systolic (congestive) heart failure: Secondary | ICD-10-CM | POA: Diagnosis not present

## 2017-07-26 DIAGNOSIS — F17228 Nicotine dependence, chewing tobacco, with other nicotine-induced disorders: Secondary | ICD-10-CM | POA: Insufficient documentation

## 2017-07-26 LAB — CBC
HCT: 43.3 % (ref 39.0–52.0)
Hemoglobin: 14.3 g/dL (ref 13.0–17.0)
MCH: 29 pg (ref 26.0–34.0)
MCHC: 33 g/dL (ref 30.0–36.0)
MCV: 87.8 fL (ref 78.0–100.0)
Platelets: 269 10*3/uL (ref 150–400)
RBC: 4.93 MIL/uL (ref 4.22–5.81)
RDW: 12.9 % (ref 11.5–15.5)
WBC: 8.4 10*3/uL (ref 4.0–10.5)

## 2017-07-26 LAB — COMPREHENSIVE METABOLIC PANEL
ALT: 40 U/L (ref 17–63)
AST: 31 U/L (ref 15–41)
Albumin: 3.7 g/dL (ref 3.5–5.0)
Alkaline Phosphatase: 40 U/L (ref 38–126)
Anion gap: 8 (ref 5–15)
BUN: 12 mg/dL (ref 6–20)
CO2: 22 mmol/L (ref 22–32)
Calcium: 8.8 mg/dL — ABNORMAL LOW (ref 8.9–10.3)
Chloride: 110 mmol/L (ref 101–111)
Creatinine, Ser: 0.7 mg/dL (ref 0.61–1.24)
GFR calc Af Amer: >60 mL/min (ref >60)
GFR calc non Af Amer: >60 mL/min (ref >60)
Glucose, Bld: 129 mg/dL — ABNORMAL HIGH (ref 65–99)
Potassium: 3.8 mmol/L (ref 3.5–5.1)
Sodium: 140 mmol/L (ref 135–145)
Total Bilirubin: 0.9 mg/dL (ref 0.3–1.2)
Total Protein: 6.8 g/dL (ref 6.5–8.1)

## 2017-07-26 LAB — URINALYSIS, ROUTINE W REFLEX MICROSCOPIC
Bilirubin Urine: NEGATIVE
Glucose, UA: NEGATIVE mg/dL
Hgb urine dipstick: NEGATIVE
Ketones, ur: NEGATIVE mg/dL
Nitrite: NEGATIVE
Protein, ur: NEGATIVE mg/dL
Specific Gravity, Urine: 1.018 (ref 1.005–1.030)
pH: 6 (ref 5.0–8.0)

## 2017-07-26 LAB — LIPASE, BLOOD: Lipase: 1960 U/L — ABNORMAL HIGH (ref 11–51)

## 2017-07-26 MED ORDER — HYDROCODONE-ACETAMINOPHEN 5-325 MG PO TABS: 2.0000 | ORAL_TABLET | ORAL | 0 refills | Status: DC | PRN

## 2017-07-26 MED ORDER — SODIUM CHLORIDE 0.9 % IV BOLUS (SEPSIS): 1000.0000 mL | Freq: Once | INTRAVENOUS | Status: DC

## 2017-07-26 MED ORDER — ONDANSETRON 4 MG PO TBDP: 4.0000 mg | ORAL_TABLET | Freq: Three times a day (TID) | ORAL | 0 refills | Status: DC | PRN

## 2017-07-26 NOTE — ED Provider Notes (Signed)
[facility] EMERGENCY DEPARTMENT Provider Note   CSN: [id number] Arrival date & time: 07/26/17  1609     History   Chief Complaint Chief Complaint  Patient presents with  . Abdominal Pain    HPI Nathaniel [name] is a 26 y.o. male who presents with abdominal pain. PMH significant for AF/Aflutter (s/p ablation 11/2016), proior systolic CHF (recent EF 55%), hx of alcohol induced pancreatitis (w/ known psuedocyst), fatty liver, depression, morbid obesity. Wife is at bedside. The patient states he had an acute onset of abdominal pain this afternoon. It is over the LUQ and radiates across the upper abdomen. It feels similar to prior episodes of pancreatitis. He took 800mg  Ibuprofen which relieved some of the pain. Currently he has minimal pain and drank water and did not have worsening of pain or N/V. He denies fever, chills, chest pain, SOB, N/V/D, dysuria. He denies alcohol use. He has previously followed with GI (Dr. Christella Hartigan) but has not seen them in a while. CT in Oct 2018 showed stable pseudocyst.     HPI  Past Medical History:  Diagnosis Date  . Atrial flutter (HCC) 01/17/2016   Admx 5/17 with AFlutter with RVR in setting of pancreatitis // CHADS2-VASc=1 (CHF) // Anticoag not started // Amiodarone >> NSR   . Chronic lower back pain   . Chronic systolic CHF (congestive heart failure) (HCC)    Echo 01/17/16 - EF 35-40%, diffuse HK, mild LAE   . 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HCC) 02/03/2016   a. EF 35-40% in setting of AFlutter with RVR in 5/17 // probably tachycardia mediated // needs FU echo 2-3 mos after NSR restored // b. Limited Echo 7/17: EF 45-50%, diff HK, mild LAE  . Depression    "last 2-3 months; never treated for it" (12/09/2016)  . Fatty liver 12/2015   per CT  . Headache    "monthly" (12/09/2016)  . Obesity    BMI 45 12/2015  . Pancreatitis 2012   recurrent acute. Admissions to [facility], 12/2015 admissin to [facility] hospital, ct then shows pseudocyst.   .  Pancreatitis 2012 X 2; 2017 X 2  . Paroxysmal atrial fibrillation (HCC)   . Typical atrial flutter (HCC) 06/06/2017    Patient Active Problem List   Diagnosis Date Noted  . Gastroenteritis 06/05/2017  . History of cardiomyopathy 06/05/2017  . Hypokalemia 06/05/2017  . Hypomagnesemia 06/05/2017  . Paroxysmal atrial fibrillation (HCC) 12/09/2016  . A-fib (HCC) 12/09/2016  . Pancreatitis 03/05/2016  . Acute pancreatitis without infection or necrosis 03/05/2016  . Acute pancreatitis 03/05/2016  . 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HCC) 02/03/2016  . Chronic systolic CHF (congestive heart failure) (HCC)   . Pancreatic pseudocyst   . Morbid obesity due to excess calories (HCC)   . Alcohol abuse   . Dyslipidemia   . History of pancreatitis 01/17/2016  . Snoring 01/17/2016  . Former smoker 01/17/2016  . EtOH dependence (HCC) 01/17/2016  . Atrial flutter (HCC) 01/17/2016    Past Surgical History:  Procedure Laterality Date  . ATRIAL FIBRILLATION ABLATION N/A 12/09/2016   Procedure: Atrial Fibrillation Ablation;  Surgeon: Hillis Range, MD;  [facility] INVASIVE CV LAB;  Service: Cardiovascular;  Laterality: N/A;  . FRACTURE SURGERY    . HAND SURGERY Right ~ 2008   "had pins in it; pulled them"  . TEE WITHOUT CARDIOVERSION N/A 12/08/2016   Procedure: TRANSESOPHAGEAL ECHOCARDIOGRAM (TEE);  Surgeon: Jake Bathe, MD;  Location: [facility] ENDOSCOPY;  Service: Cardiovascular;  Laterality: N/A;  . TONSILLECTOMY  AND ADENOIDECTOMY     "in my teens"       Home Medications    Prior to Admission medications   Medication Sig Start Date End Date Taking? Authorizing Provider  diltiazem (CARDIZEM) 30 MG tablet Take 1 tablet every 4 hours AS NEEDED for rapid afib heart rate >100 01/10/17   Newman Niparroll, Donna C, NP  ELIQUIS 5 MG TABS tablet TAKE 1 TABLET BY MOUTH TWICE DAILY 03/28/17   Allred, Fayrene FearingJames, MD  flecainide (TAMBOCOR) 100 MG tablet TAKE 1 TABLET(100 MG) BY MOUTH TWICE DAILY 06/27/17   Newman Niparroll, Donna C,  NP  lisinopril (PRINIVIL,ZESTRIL) 5 MG tablet TAKE 1 TABLET(5 MG) BY MOUTH DAILY 03/07/17   Duke SalviaKlein, Steven C, MD  magnesium oxide (MAG-OX) 400 (241.3 Mg) MG tablet Take 1 tablet (400 mg total) by mouth daily. 06/06/17   Dunn, Tacey Ruizayna N, PA-C  metoprolol succinate (TOPROL-XL) 25 MG 24 hr tablet TAKE 1 TABLET(25 MG) BY MOUTH DAILY 05/05/17   Allred, Fayrene FearingJames, MD  potassium chloride SA (K-DUR,KLOR-CON) 20 MEQ tablet Take 1 tablet (20 mEq total) by mouth daily. 06/06/17   Dunn, Tacey Ruizayna N, PA-C  ranitidine (ZANTAC) 150 MG capsule Take 150 mg by mouth 2 (two) times daily.    [provider]  triamcinolone cream (KENALOG) 0.1 % Apply 1 application topically every evening.    [provider]    Family History Family History  Problem Relation Age of Onset  . Diabetes Mother     Social History Social History   Tobacco Use  . Smoking status: Former Smoker    Packs/day: 0.50    Types: Cigarettes  . Smokeless tobacco: Current User    Types: Snuff  . Tobacco comment: 12/09/2016 "quit chewing May 2017"  Substance Use Topics  . Alcohol use: Yes    Comment: occasional  . Drug use: Yes    Types: Marijuana    Comment: 12/09/2016 "daily from age 26 to [age]"     Allergies   Patient has no known allergies.   Review of Systems Review of Systems  Constitutional: Negative for appetite change, chills and fever.  Respiratory: Negative for shortness of breath.   Cardiovascular: Negative for chest pain.  Gastrointestinal: Positive for abdominal pain (resolved). Negative for diarrhea, nausea and vomiting.  Genitourinary: Negative for difficulty urinating and dysuria.  All other systems reviewed and are negative.    Physical Exam Updated Vital Signs BP 138/70   Pulse (!) 110   Temp 98.1 F (36.7 C) (Oral)   Resp 18   Ht 6' (1.829 m)   Wt (!) 145.2 kg (320 lb)   SpO2 98%   BMI 43.40 kg/m   Physical Exam  Constitutional: He is oriented to person, place, and time. He appears  well-developed and well-nourished. No distress.  Obese. Comfortable appearing  HENT:  Head: Normocephalic and atraumatic.  Eyes: Conjunctivae are normal. Pupils are equal, round, and reactive to light. Right eye exhibits no discharge. Left eye exhibits no discharge. No scleral icterus.  Neck: Normal range of motion.  Cardiovascular: Normal rate. An irregularly irregular rhythm present. Exam reveals no gallop and no friction rub.  No murmur heard. Pulmonary/Chest: Effort normal and breath sounds normal. No stridor. No respiratory distress. He has no wheezes. He has no rales. He exhibits no tenderness.  Abdominal: Soft. Bowel sounds are normal. He exhibits no distension and no mass. There is tenderness (minimal upper abdominal tenderness). There is no rebound and no guarding. No hernia.  Neurological: He is alert  and oriented to person, place, and time.  Skin: Skin is warm and dry.  Psychiatric: He has a normal mood and affect. His behavior is normal.  Nursing note and vitals reviewed.    ED Treatments / Results  Labs (all labs ordered are listed, but only abnormal results are displayed) Labs Reviewed  LIPASE, BLOOD - Abnormal; Notable for the following components:      Result Value   Lipase 1,960 (*)    All other components within normal limits  COMPREHENSIVE METABOLIC PANEL - Abnormal; Notable for the following components:   Glucose, Bld 129 (*)    Calcium 8.8 (*)    All other components within normal limits  URINALYSIS, ROUTINE W REFLEX MICROSCOPIC - Abnormal; Notable for the following components:   Leukocytes, UA TRACE (*)    Bacteria, UA RARE (*)    Squamous Epithelial / LPF 0-5 (*)    All other components within normal limits  CBC    EKG  EKG Interpretation None       Radiology No results found.  Procedures Procedures (including critical care time)  Medications Ordered in ED Medications - No data to display   Initial Impression / Assessment and Plan / ED  Course  I have reviewed the triage vital signs and the nursing notes.  Pertinent labs & imaging results that were available during my care of the patient were reviewed by me and considered in my medical decision making (see chart for details).  26 year old male presents with acute pancreatitis. He has a variable HR and is in A.fib which is chronic. All other vitals are normal. CBC is normal. CMP is unremarkable. Lipase is 1,960. UA is unremarkable. He denies alcohol use so unclear what the etiology is. Abdomen is soft and minimally tender. He has tolerated PO. Will give him pan medicine, antiemetics, and advised a clear liquid diet for the next 2-3 days. The patient and his wife were comfortable with this plan. Advised f/u with GI regarding pseudotumor. Strict return precautions were given.  Final Clinical Impressions(s) / ED Diagnoses   Final diagnoses:  Acute pancreatitis, unspecified complication status, unspecified pancreatitis type    ED Discharge Orders    None       Bethel Born, PA-C 07/26/17 2313    Little, Ambrose Finland, MD 07/28/17 1010

## 2017-07-26 NOTE — Discharge Instructions (Signed)
Please follow a clear liquid diet for the next 2-3 days. You can slowly introduce solid foods after that. Take pain medicine as needed Take nausea medicine as needed. Please follow up with Dr. Christella Hartigan Return if worsening

## 2017-07-26 NOTE — ED Triage Notes (Signed)
Pt c/o sharp generalized abdominal pain x 2 hours PTA. Pt states he took 800mg  ibuprofen PTA and pain has subsided some. Denies nausea/vomiting/diarrhea. Hx a-fib, CHF, denies chest pain/shortness of breath, no significant weight gain.

## 2017-07-29 ENCOUNTER — Encounter (HOSPITAL_COMMUNITY): Payer: Self-pay | Admitting: *Deleted

## 2017-07-29 ENCOUNTER — Other Ambulatory Visit: Payer: Self-pay

## 2017-07-29 ENCOUNTER — Inpatient Hospital Stay (HOSPITAL_COMMUNITY)
Admission: EM | Admit: 2017-07-29 | Discharge: 2017-08-06 | DRG: 439 | Disposition: A | Discharge status: Home / Self Care | Payer: BLUE CROSS/BLUE SHIELD | Attending: Internal Medicine | Admitting: Internal Medicine

## 2017-07-29 DIAGNOSIS — K859 Acute pancreatitis without necrosis or infection, unspecified: Secondary | ICD-10-CM | POA: Diagnosis not present

## 2017-07-29 DIAGNOSIS — I420 Dilated cardiomyopathy: Secondary | ICD-10-CM | POA: Diagnosis present

## 2017-07-29 DIAGNOSIS — F1011 Alcohol abuse, in remission: Secondary | ICD-10-CM | POA: Diagnosis present

## 2017-07-29 DIAGNOSIS — K863 Pseudocyst of pancreas: Secondary | ICD-10-CM | POA: Diagnosis present

## 2017-07-29 DIAGNOSIS — Z7901 Long term (current) use of anticoagulants: Secondary | ICD-10-CM

## 2017-07-29 DIAGNOSIS — K861 Other chronic pancreatitis: Secondary | ICD-10-CM | POA: Diagnosis present

## 2017-07-29 DIAGNOSIS — R066 Hiccough: Secondary | ICD-10-CM | POA: Diagnosis not present

## 2017-07-29 DIAGNOSIS — Z87891 Personal history of nicotine dependence: Secondary | ICD-10-CM

## 2017-07-29 DIAGNOSIS — G8929 Other chronic pain: Secondary | ICD-10-CM | POA: Diagnosis present

## 2017-07-29 DIAGNOSIS — K8500 Idiopathic acute pancreatitis without necrosis or infection: Secondary | ICD-10-CM

## 2017-07-29 DIAGNOSIS — I5022 Chronic systolic (congestive) heart failure: Secondary | ICD-10-CM | POA: Diagnosis present

## 2017-07-29 DIAGNOSIS — K760 Fatty (change of) liver, not elsewhere classified: Secondary | ICD-10-CM | POA: Diagnosis present

## 2017-07-29 DIAGNOSIS — Z6841 Body Mass Index (BMI) 40.0 and over, adult: Secondary | ICD-10-CM

## 2017-07-29 DIAGNOSIS — E785 Hyperlipidemia, unspecified: Secondary | ICD-10-CM | POA: Diagnosis present

## 2017-07-29 DIAGNOSIS — Z833 Family history of diabetes mellitus: Secondary | ICD-10-CM

## 2017-07-29 DIAGNOSIS — F1021 Alcohol dependence, in remission: Secondary | ICD-10-CM | POA: Diagnosis present

## 2017-07-29 DIAGNOSIS — I110 Hypertensive heart disease with heart failure: Secondary | ICD-10-CM | POA: Diagnosis present

## 2017-07-29 DIAGNOSIS — M545 Low back pain: Secondary | ICD-10-CM | POA: Diagnosis present

## 2017-07-29 DIAGNOSIS — I480 Paroxysmal atrial fibrillation: Secondary | ICD-10-CM | POA: Diagnosis present

## 2017-07-29 DIAGNOSIS — K852 Alcohol induced acute pancreatitis without necrosis or infection: Secondary | ICD-10-CM | POA: Diagnosis not present

## 2017-07-29 DIAGNOSIS — R109 Unspecified abdominal pain: Secondary | ICD-10-CM | POA: Diagnosis not present

## 2017-07-29 DIAGNOSIS — K858 Other acute pancreatitis without necrosis or infection: Secondary | ICD-10-CM | POA: Diagnosis not present

## 2017-07-29 DIAGNOSIS — E66813 Obesity, class 3: Secondary | ICD-10-CM | POA: Diagnosis present

## 2017-07-29 MED ORDER — SODIUM CHLORIDE 0.9 % IV BOLUS (SEPSIS)
500.0000 mL | Freq: Once | INTRAVENOUS | Status: AC
  Administered 2017-07-30: 500 mL via INTRAVENOUS

## 2017-07-29 NOTE — ED Triage Notes (Signed)
Pt c/o right side abd pain that started earlier in the week, was seen at [facility] er on 07/26/2017, was sent home but pt states that he has not gotten any better,

## 2017-07-30 ENCOUNTER — Other Ambulatory Visit: Payer: Self-pay

## 2017-07-30 ENCOUNTER — Emergency Department (HOSPITAL_COMMUNITY): Payer: BLUE CROSS/BLUE SHIELD

## 2017-07-30 DIAGNOSIS — F1011 Alcohol abuse, in remission: Secondary | ICD-10-CM

## 2017-07-30 DIAGNOSIS — M545 Low back pain: Secondary | ICD-10-CM | POA: Diagnosis present

## 2017-07-30 DIAGNOSIS — K861 Other chronic pancreatitis: Secondary | ICD-10-CM | POA: Diagnosis present

## 2017-07-30 DIAGNOSIS — G8929 Other chronic pain: Secondary | ICD-10-CM | POA: Diagnosis present

## 2017-07-30 DIAGNOSIS — I420 Dilated cardiomyopathy: Secondary | ICD-10-CM | POA: Diagnosis present

## 2017-07-30 DIAGNOSIS — I5022 Chronic systolic (congestive) heart failure: Secondary | ICD-10-CM | POA: Diagnosis present

## 2017-07-30 DIAGNOSIS — Z87891 Personal history of nicotine dependence: Secondary | ICD-10-CM | POA: Diagnosis not present

## 2017-07-30 DIAGNOSIS — K8500 Idiopathic acute pancreatitis without necrosis or infection: Secondary | ICD-10-CM | POA: Diagnosis not present

## 2017-07-30 DIAGNOSIS — K859 Acute pancreatitis without necrosis or infection, unspecified: Secondary | ICD-10-CM | POA: Diagnosis not present

## 2017-07-30 DIAGNOSIS — Z7901 Long term (current) use of anticoagulants: Secondary | ICD-10-CM | POA: Diagnosis not present

## 2017-07-30 DIAGNOSIS — E785 Hyperlipidemia, unspecified: Secondary | ICD-10-CM | POA: Diagnosis present

## 2017-07-30 DIAGNOSIS — F1021 Alcohol dependence, in remission: Secondary | ICD-10-CM | POA: Diagnosis present

## 2017-07-30 DIAGNOSIS — K852 Alcohol induced acute pancreatitis without necrosis or infection: Secondary | ICD-10-CM | POA: Diagnosis not present

## 2017-07-30 DIAGNOSIS — K863 Pseudocyst of pancreas: Secondary | ICD-10-CM | POA: Diagnosis not present

## 2017-07-30 DIAGNOSIS — Z6841 Body Mass Index (BMI) 40.0 and over, adult: Secondary | ICD-10-CM | POA: Diagnosis not present

## 2017-07-30 DIAGNOSIS — R066 Hiccough: Secondary | ICD-10-CM | POA: Diagnosis not present

## 2017-07-30 DIAGNOSIS — I480 Paroxysmal atrial fibrillation: Secondary | ICD-10-CM | POA: Diagnosis not present

## 2017-07-30 DIAGNOSIS — I110 Hypertensive heart disease with heart failure: Secondary | ICD-10-CM | POA: Diagnosis present

## 2017-07-30 DIAGNOSIS — K760 Fatty (change of) liver, not elsewhere classified: Secondary | ICD-10-CM | POA: Diagnosis present

## 2017-07-30 DIAGNOSIS — K858 Other acute pancreatitis without necrosis or infection: Secondary | ICD-10-CM | POA: Diagnosis not present

## 2017-07-30 DIAGNOSIS — Z833 Family history of diabetes mellitus: Secondary | ICD-10-CM | POA: Diagnosis not present

## 2017-07-30 DIAGNOSIS — R109 Unspecified abdominal pain: Secondary | ICD-10-CM | POA: Diagnosis not present

## 2017-07-30 LAB — CBC WITH DIFFERENTIAL/PLATELET
Basophils Absolute: 0 10*3/uL (ref 0.0–0.1)
Basophils Absolute: 0 10*3/uL (ref 0.0–0.1)
Basophils Relative: 0 %
Basophils Relative: 0 %
Eosinophils Absolute: 0.1 10*3/uL (ref 0.0–0.7)
Eosinophils Absolute: 0.2 10*3/uL (ref 0.0–0.7)
Eosinophils Relative: 1 %
Eosinophils Relative: 2 %
HCT: 39.8 % (ref 39.0–52.0)
HCT: 43.1 % (ref 39.0–52.0)
Hemoglobin: 12.9 g/dL — ABNORMAL LOW (ref 13.0–17.0)
Hemoglobin: 14 g/dL (ref 13.0–17.0)
Lymphocytes Relative: 16 %
Lymphocytes Relative: 17 %
Lymphs Abs: 1.5 10*3/uL (ref 0.7–4.0)
Lymphs Abs: 1.8 10*3/uL (ref 0.7–4.0)
MCH: 29.2 pg (ref 26.0–34.0)
MCH: 29.3 pg (ref 26.0–34.0)
MCHC: 32.4 g/dL (ref 30.0–36.0)
MCHC: 32.5 g/dL (ref 30.0–36.0)
MCV: 89.8 fL (ref 78.0–100.0)
MCV: 90.2 fL (ref 78.0–100.0)
Monocytes Absolute: 0.9 10*3/uL (ref 0.1–1.0)
Monocytes Absolute: 0.9 10*3/uL (ref 0.1–1.0)
Monocytes Relative: 8 %
Monocytes Relative: 9 %
Neutro Abs: 6.5 10*3/uL (ref 1.7–7.7)
Neutro Abs: 8.1 10*3/uL — ABNORMAL HIGH (ref 1.7–7.7)
Neutrophils Relative %: 72 %
Neutrophils Relative %: 75 %
Platelets: 221 10*3/uL (ref 150–400)
Platelets: 243 10*3/uL (ref 150–400)
RBC: 4.41 MIL/uL (ref 4.22–5.81)
RBC: 4.8 MIL/uL (ref 4.22–5.81)
RDW: 12.6 % (ref 11.5–15.5)
RDW: 12.8 % (ref 11.5–15.5)
WBC: 11 10*3/uL — ABNORMAL HIGH (ref 4.0–10.5)
WBC: 9.1 10*3/uL (ref 4.0–10.5)

## 2017-07-30 LAB — URINALYSIS, ROUTINE W REFLEX MICROSCOPIC
Bacteria, UA: NONE SEEN
Bilirubin Urine: NEGATIVE
Glucose, UA: NEGATIVE mg/dL
Hgb urine dipstick: NEGATIVE
Ketones, ur: 5 mg/dL — AB
Nitrite: NEGATIVE
Protein, ur: 30 mg/dL — AB
Specific Gravity, Urine: 1.03 (ref 1.005–1.030)
pH: 5 (ref 5.0–8.0)

## 2017-07-30 LAB — COMPREHENSIVE METABOLIC PANEL
ALT: 40 U/L (ref 17–63)
ALT: 36 U/L (ref 17–63)
AST: 33 U/L (ref 15–41)
AST: 28 U/L (ref 15–41)
Albumin: 4.3 g/dL (ref 3.5–5.0)
Albumin: 3.7 g/dL (ref 3.5–5.0)
Alkaline Phosphatase: 45 U/L (ref 38–126)
Alkaline Phosphatase: 39 U/L (ref 38–126)
Anion gap: 6 (ref 5–15)
Anion gap: 7 (ref 5–15)
BUN: 12 mg/dL (ref 6–20)
BUN: 10 mg/dL (ref 6–20)
CO2: 26 mmol/L (ref 22–32)
CO2: 25 mmol/L (ref 22–32)
Calcium: 9 mg/dL (ref 8.9–10.3)
Calcium: 8.4 mg/dL — ABNORMAL LOW (ref 8.9–10.3)
Chloride: 105 mmol/L (ref 101–111)
Chloride: 106 mmol/L (ref 101–111)
Creatinine, Ser: 0.72 mg/dL (ref 0.61–1.24)
Creatinine, Ser: 0.59 mg/dL — ABNORMAL LOW (ref 0.61–1.24)
GFR calc Af Amer: >60 mL/min (ref >60)
GFR calc Af Amer: >60 mL/min (ref >60)
GFR calc non Af Amer: >60 mL/min (ref >60)
GFR calc non Af Amer: >60 mL/min (ref >60)
Glucose, Bld: 98 mg/dL (ref 65–99)
Glucose, Bld: 87 mg/dL (ref 65–99)
Potassium: 3.8 mmol/L (ref 3.5–5.1)
Potassium: 4.3 mmol/L (ref 3.5–5.1)
Sodium: 137 mmol/L (ref 135–145)
Sodium: 138 mmol/L (ref 135–145)
Total Bilirubin: 1.1 mg/dL (ref 0.3–1.2)
Total Bilirubin: 1.2 mg/dL (ref 0.3–1.2)
Total Protein: 7.9 g/dL (ref 6.5–8.1)
Total Protein: 7 g/dL (ref 6.5–8.1)

## 2017-07-30 LAB — LIPASE, BLOOD
Lipase: 325 U/L — ABNORMAL HIGH (ref 11–51)
Lipase: 102 U/L — ABNORMAL HIGH (ref 11–51)

## 2017-07-30 MED ORDER — APIXABAN 5 MG PO TABS
5.0000 mg | ORAL_TABLET | Freq: Two times a day (BID) | ORAL | Status: DC
  Administered 2017-07-30 – 2017-08-02 (×8): 5 mg via ORAL
  Filled 2017-07-30 (×8): qty 1

## 2017-07-30 MED ORDER — HYDROMORPHONE HCL 1 MG/ML IJ SOLN
1.0000 mg | Freq: Once | INTRAMUSCULAR | Status: AC
  Administered 2017-07-30: 1 mg via INTRAVENOUS
  Filled 2017-07-30: qty 1

## 2017-07-30 MED ORDER — IOPAMIDOL (ISOVUE-300) INJECTION 61%
100.0000 mL | Freq: Once | INTRAVENOUS | Status: AC | PRN
  Administered 2017-07-30: 100 mL via INTRAVENOUS

## 2017-07-30 MED ORDER — SODIUM CHLORIDE 0.9 % IV BOLUS (SEPSIS)
1000.0000 mL | Freq: Once | INTRAVENOUS | Status: AC
  Administered 2017-07-30: 1000 mL via INTRAVENOUS

## 2017-07-30 MED ORDER — ONDANSETRON HCL 4 MG PO TABS: 4.0000 mg | ORAL_TABLET | Freq: Four times a day (QID) | ORAL | Status: DC | PRN

## 2017-07-30 MED ORDER — ACETAMINOPHEN 650 MG RE SUPP: 650.0000 mg | Freq: Four times a day (QID) | RECTAL | Status: DC | PRN

## 2017-07-30 MED ORDER — ONDANSETRON HCL 4 MG/2ML IJ SOLN
4.0000 mg | Freq: Four times a day (QID) | INTRAMUSCULAR | Status: DC | PRN
  Administered 2017-07-30 – 2017-08-03 (×8): 4 mg via INTRAVENOUS
  Filled 2017-07-30 (×8): qty 2

## 2017-07-30 MED ORDER — FLECAINIDE ACETATE 50 MG PO TABS
100.0000 mg | ORAL_TABLET | Freq: Two times a day (BID) | ORAL | Status: DC
  Administered 2017-07-30 – 2017-08-06 (×15): 100 mg via ORAL
  Filled 2017-07-30 (×2): qty 2
  Filled 2017-07-30: qty 1
  Filled 2017-07-30: qty 2
  Filled 2017-07-30: qty 1
  Filled 2017-07-30 (×4): qty 2
  Filled 2017-07-30: qty 1
  Filled 2017-07-30 (×4): qty 2
  Filled 2017-07-30: qty 1
  Filled 2017-07-30 (×2): qty 2
  Filled 2017-07-30: qty 1
  Filled 2017-07-30: qty 2

## 2017-07-30 MED ORDER — SODIUM CHLORIDE 0.9 % IV SOLN
INTRAVENOUS | Status: AC
  Administered 2017-07-30: 07:00:00 via INTRAVENOUS

## 2017-07-30 MED ORDER — LISINOPRIL 5 MG PO TABS
5.0000 mg | ORAL_TABLET | Freq: Every day | ORAL | Status: DC
  Administered 2017-07-30 – 2017-08-06 (×8): 5 mg via ORAL
  Filled 2017-07-30 (×8): qty 1

## 2017-07-30 MED ORDER — ONDANSETRON HCL 4 MG/2ML IJ SOLN
4.0000 mg | Freq: Once | INTRAMUSCULAR | Status: AC
  Administered 2017-07-30: 4 mg via INTRAVENOUS
  Filled 2017-07-30: qty 2

## 2017-07-30 MED ORDER — ACETAMINOPHEN 325 MG PO TABS: 650.0000 mg | ORAL_TABLET | Freq: Four times a day (QID) | ORAL | Status: DC | PRN

## 2017-07-30 MED ORDER — SODIUM CHLORIDE 0.9 % IV SOLN
INTRAVENOUS | Status: AC
  Administered 2017-07-30 – 2017-07-31 (×2): via INTRAVENOUS

## 2017-07-30 MED ORDER — HYDROMORPHONE HCL 1 MG/ML IJ SOLN
0.5000 mg | INTRAMUSCULAR | Status: DC | PRN
  Administered 2017-07-30: 1 mg via INTRAVENOUS
  Administered 2017-07-30: 0.5 mg via INTRAVENOUS
  Administered 2017-07-30 – 2017-07-31 (×5): 1 mg via INTRAVENOUS
  Administered 2017-08-01: 0.5 mg via INTRAVENOUS
  Administered 2017-08-01: 1 mg via INTRAVENOUS
  Administered 2017-08-01: 0.5 mg via INTRAVENOUS
  Administered 2017-08-02 (×4): 1 mg via INTRAVENOUS
  Filled 2017-07-30 (×15): qty 1

## 2017-07-30 MED ORDER — FAMOTIDINE IN NACL 20-0.9 MG/50ML-% IV SOLN
20.0000 mg | Freq: Two times a day (BID) | INTRAVENOUS | Status: DC
  Administered 2017-07-30 – 2017-08-03 (×10): 20 mg via INTRAVENOUS
  Filled 2017-07-30 (×12): qty 50

## 2017-07-30 NOTE — ED Notes (Signed)
EDP Rancour made aware that the pt states he is in pain. While in pt's room, it seemed if the pain was coming in waves.

## 2017-07-30 NOTE — Progress Notes (Signed)
Patient seen and examined.  Agree with Dr. Francesco Runner history and physical.  Patient was admitted for acute pancreatitis with a history of alcoholism in remission as well as atrial fibrillation and systolic heart failure.  He was started on aggressive IV fluid resuscitation as well as IV pain medication and antiemetics.  Plan is for bowel rest and as per above.  Of note patient and his mother asked that Dr. Karilyn Cota be consulted however no need for formal consultation at this time.  Dr. Karilyn Cota states he will make a social visit

## 2017-07-30 NOTE — ED Provider Notes (Signed)
[facility] EMERGENCY DEPARTMENT Provider Note   CSN: [id number] Arrival date & time: 07/29/17  2305     History   Chief Complaint Chief Complaint  Patient presents with  . Abdominal Pain    HPI Nathaniel [name] is a 26 y.o. male.  Patient with history of recurrent pancreatitis presenting with upper abdominal pain is previously worsening over the past 2 days.  He was seen for this pain at [facility] November 27 and sent home with hydrocodone.  He states he did relatively well for 2 days but became worse again yesterday.  The pain is severe and comes and goes in waves.  It is similar to his previous episodes of pancreatitis.  He denies any fever, chills, nausea or vomiting.  Does not know when his last bowel movement was.  No pain with urination or blood in the urine.  No testicular pain.  On Eliquis for history of atrial fibrillation.  No previous abdominal surgeries.  Still has appendix and gallbladder. Denies any recent alcohol use.   The history is provided by the patient.  Abdominal Pain   Pertinent negatives include fever, nausea, vomiting, headaches, arthralgias and myalgias.    Past Medical History:  Diagnosis Date  . Atrial flutter (HCC) 01/17/2016   Admx 5/17 with AFlutter with RVR in setting of pancreatitis // CHADS2-VASc=1 (CHF) // Anticoag not started // Amiodarone >> NSR   . Chronic lower back pain   . Chronic systolic CHF (congestive heart failure) (HCC)    Echo 01/17/16 - EF 35-40%, diffuse HK, mild LAE   . 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HCC) 02/03/2016   a. EF 35-40% in setting of AFlutter with RVR in 5/17 // probably tachycardia mediated // needs FU echo 2-3 mos after NSR restored // b. Limited Echo 7/17: EF 45-50%, diff HK, mild LAE  . Depression    "last 2-3 months; never treated for it" (12/09/2016)  . Fatty liver 12/2015   per CT  . Headache    "monthly" (12/09/2016)  . Obesity    BMI 45 12/2015  . Pancreatitis 2012   recurrent acute. Admissions  to [facility], 12/2015 admissin to [facility] hospital, ct then shows pseudocyst.   . Pancreatitis 2012 X 2; 2017 X 2  . Paroxysmal atrial fibrillation (HCC)   . Typical atrial flutter (HCC) 06/06/2017    Patient Active Problem List   Diagnosis Date Noted  . Gastroenteritis 06/05/2017  . History of cardiomyopathy 06/05/2017  . Hypokalemia 06/05/2017  . Hypomagnesemia 06/05/2017  . Paroxysmal atrial fibrillation (HCC) 12/09/2016  . A-fib (HCC) 12/09/2016  . Pancreatitis 03/05/2016  . Acute pancreatitis without infection or necrosis 03/05/2016  . Acute pancreatitis 03/05/2016  . 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HCC) 02/03/2016  . Chronic systolic CHF (congestive heart failure) (HCC)   . Pancreatic pseudocyst   . Morbid obesity due to excess calories (HCC)   . Alcohol abuse   . Dyslipidemia   . History of pancreatitis 01/17/2016  . Snoring 01/17/2016  . Former smoker 01/17/2016  . EtOH dependence (HCC) 01/17/2016  . Atrial flutter (HCC) 01/17/2016    Past Surgical History:  Procedure Laterality Date  . ATRIAL FIBRILLATION ABLATION N/A 12/09/2016   Procedure: Atrial Fibrillation Ablation;  Surgeon: Hillis Range, MD;  Location: [facility] INVASIVE CV LAB;  Service: Cardiovascular;  Laterality: N/A;  . FRACTURE SURGERY    . HAND SURGERY Right ~ 2008   "had pins in it; pulled them"  . TEE WITHOUT CARDIOVERSION N/A 12/08/2016   Procedure: TRANSESOPHAGEAL ECHOCARDIOGRAM (  TEE);  Surgeon: Jake BatheMark C Skains, MD;  Location: [facility] ENDOSCOPY;  Service: Cardiovascular;  Laterality: N/A;  . TONSILLECTOMY AND ADENOIDECTOMY     "in my teens"       Home Medications    Prior to Admission medications   Medication Sig Start Date End Date Taking? Authorizing Provider  diltiazem (CARDIZEM) 30 MG tablet Take 1 tablet every 4 hours AS NEEDED for rapid afib heart rate >100 01/10/17   Newman Niparroll, Donna C, NP  ELIQUIS 5 MG TABS tablet TAKE 1 TABLET BY MOUTH TWICE DAILY 03/28/17   Allred, Fayrene FearingJames, MD  flecainide (TAMBOCOR) 100 MG tablet  TAKE 1 TABLET(100 MG) BY MOUTH TWICE DAILY 06/27/17   Newman Niparroll, Donna C, NP  HYDROcodone-acetaminophen (NORCO/VICODIN) 5-325 MG tablet Take 2 tablets by mouth every 4 (four) hours as needed. 07/26/17   Bethel BornGekas, Kelly Marie, PA-C  lisinopril (PRINIVIL,ZESTRIL) 5 MG tablet TAKE 1 TABLET(5 MG) BY MOUTH DAILY 03/07/17   Duke SalviaKlein, Steven C, MD  magnesium oxide (MAG-OX) 400 (241.3 Mg) MG tablet Take 1 tablet (400 mg total) by mouth daily. 06/06/17   Dunn, Tacey Ruizayna N, PA-C  metoprolol succinate (TOPROL-XL) 25 MG 24 hr tablet TAKE 1 TABLET(25 MG) BY MOUTH DAILY 05/05/17   Allred, Fayrene FearingJames, MD  ondansetron (ZOFRAN ODT) 4 MG disintegrating tablet Take 1 tablet (4 mg total) by mouth every 8 (eight) hours as needed for nausea or vomiting. 07/26/17   Bethel BornGekas, Kelly Marie, PA-C  potassium chloride SA (K-DUR,KLOR-CON) 20 MEQ tablet Take 1 tablet (20 mEq total) by mouth daily. 06/06/17   Dunn, Tacey Ruizayna N, PA-C  ranitidine (ZANTAC) 150 MG capsule Take 150 mg by mouth 2 (two) times daily.    [provider]  triamcinolone cream (KENALOG) 0.1 % Apply 1 application topically every evening.    [provider]    Family History Family History  Problem Relation Age of Onset  . Diabetes Mother     Social History Social History   Tobacco Use  . Smoking status: Former Smoker    Packs/day: 0.50    Types: Cigarettes  . Smokeless tobacco: Current User    Types: Snuff  . Tobacco comment: 12/09/2016 "quit chewing May 2017"  Substance Use Topics  . Alcohol use: Yes    Comment: occasional  . Drug use: Yes    Types: Marijuana    Comment: 12/09/2016 "daily from age 26 to [age]"     Allergies   Patient has no known allergies.   Review of Systems Review of Systems  Constitutional: Positive for activity change and appetite change. Negative for fever.  HENT: Negative for congestion and rhinorrhea.   Respiratory: Negative for cough, chest tightness and shortness of breath.   Cardiovascular: Negative for chest pain.    Gastrointestinal: Positive for abdominal pain. Negative for nausea and vomiting.  Genitourinary: Negative for testicular pain.  Musculoskeletal: Negative for arthralgias, back pain and myalgias.  Skin: Negative for rash.  Neurological: Negative for dizziness, weakness and headaches.   all other systems are negative except as noted in the HPI and PMH.     Physical Exam Updated Vital Signs BP 132/75   Pulse 89   Temp 98.2 F (36.8 C) (Oral)   Resp 16   Ht 6' (1.829 m)   Wt (!) 145.2 kg (320 lb)   SpO2 96%   BMI 43.40 kg/m   Physical Exam  Constitutional: He is oriented to person, place, and time. He appears well-developed and well-nourished. No distress.  uncomfortable  HENT:  Head: Normocephalic and atraumatic.  Mouth/Throat: Oropharynx is clear and moist. No oropharyngeal exudate.  Eyes: Conjunctivae and EOM are normal. Pupils are equal, round, and reactive to light.  Neck: Normal range of motion. Neck supple.  No meningismus.  Cardiovascular: Normal rate, regular rhythm, normal heart sounds and intact distal pulses.  No murmur heard. Pulmonary/Chest: Effort normal and breath sounds normal. No respiratory distress.  Abdominal: Soft. There is tenderness. There is guarding. There is no rebound.  TTP Epigastrium and RUQ   Musculoskeletal: Normal range of motion. He exhibits no edema or tenderness.  Neurological: He is alert and oriented to person, place, and time. No cranial nerve deficit. He exhibits normal muscle tone. Coordination normal.  No ataxia on finger to nose bilaterally. No pronator drift. 5/5 strength throughout. CN 2-12 intact.Equal grip strength. Sensation intact.   Skin: Skin is warm.  Psychiatric: He has a normal mood and affect. His behavior is normal.  Nursing note and vitals reviewed.    ED Treatments / Results  Labs (all labs ordered are listed, but only abnormal results are displayed) Labs Reviewed  CBC WITH DIFFERENTIAL/PLATELET - Abnormal;  Notable for the following components:      Result Value   WBC 11.0 (*)    Neutro Abs 8.1 (*)    All other components within normal limits  LIPASE, BLOOD - Abnormal; Notable for the following components:   Lipase 325 (*)    All other components within normal limits  URINALYSIS, ROUTINE W REFLEX MICROSCOPIC - Abnormal; Notable for the following components:   Color, Urine AMBER (*)    APPearance HAZY (*)    Ketones, ur 5 (*)    Protein, ur 30 (*)    Leukocytes, UA MODERATE (*)    Squamous Epithelial / LPF 0-5 (*)    All other components within normal limits  COMPREHENSIVE METABOLIC PANEL  COMPREHENSIVE METABOLIC PANEL  CBC WITH DIFFERENTIAL/PLATELET  LIPASE, BLOOD    EKG  EKG Interpretation None       Radiology Ct Abdomen Pelvis W Contrast  Result Date: 07/30/2017 CLINICAL DATA:  Right-sided abdominal pain. EXAM: CT ABDOMEN AND PELVIS WITH CONTRAST TECHNIQUE: Multidetector CT imaging of the abdomen and pelvis was performed using the standard protocol following bolus administration of intravenous contrast. CONTRAST:  ISOVUE-300 IOPAMIDOL (ISOVUE-300) INJECTION 61% COMPARISON:  CT 06/04/2017 FINDINGS: Lower chest: Left basilar atelectasis or scarring. No pleural fluid. Hepatobiliary: Prominent liver spanning 20 cm cranial caudal. No focal lesion. Gallbladder physiologically distended, no calcified stone. No biliary dilatation. Pancreas: Peripancreatic stranding diffusely about the pancreas. Thick-walled chronic pseudocyst abutting the gastric antrum appears similar in size measuring 4.0 x 3.2 cm, however now as surrounding soft tissue stranding. Multiple small peripancreatic nodes. No evidence of pancreatic necrosis. Punctate pancreatic calcification in the body. No new fluid collection. Spleen: Normal in size without focal abnormality. Adrenals/Urinary Tract: No adrenal nodule. No hydronephrosis or perinephric edema. Homogeneous renal enhancement. Urinary bladder is physiologically  distended without wall thickening. Stomach/Bowel: Stomach physiologically distended. Mild stranding about the gastric antrum likely secondary to pancreatic inflammation. No bowel obstruction, wall thickening or inflammation. Normal appendix. Small volume colonic stool. Vascular/Lymphatic: Multiple small upper abdominal peripancreatic nodes have increased, likely reactive. Prominent left mesenteric nodes are again seen. The splenic and portal veins are patent. Normal caliber abdominal aorta. Reproductive: Prostate is unremarkable. Other: No significant ascites. Abdominal fluid collection. No free air. Minimal fat in the inguinal canals. Musculoskeletal: There are no acute or suspicious osseous abnormalities. IMPRESSION: 1. Acute  edematous pancreatitis. Chronic right upper quadrant pseudocyst unchanged in size, however also with surrounding inflammation. No new peripancreatic fluid collection or evidence of pancreatic necrosis. 2. Increased number of upper abdominal lymph nodes, likely reactive. Prominent left mesenteric nodes are unchanged. Electronically Signed   By: Rubye Oaks M.D.   On: 07/30/2017 02:57    Procedures Procedures (including critical care time)  Medications Ordered in ED Medications  sodium chloride 0.9 % bolus 500 mL (500 mLs Intravenous New Bag/Given 07/30/17 0042)  sodium chloride 0.9 % bolus 1,000 mL (not administered)  HYDROmorphone (DILAUDID) injection 1 mg (not administered)  ondansetron (ZOFRAN) injection 4 mg (not administered)     Initial Impression / Assessment and Plan / ED Course  I have reviewed the triage vital signs and the nursing notes.  Pertinent labs & imaging results that were available during my care of the patient were reviewed by me and considered in my medical decision making (see chart for details).    Patient with history of recurrent pancreatitis with upper abdominal pain and nausea.  Recent visit for same.  Tender across upper abdomen.  Patient  given IV fluids and pain control.  Lipase is 395 which is improved significantly from November 27.  Patient states pain is different than his usual pancreatitis and radiates more to along his right upper abdomen.  CT scan obtained and shows acute pancreatitis changes with stable pseudocyst.  Patient given additional IV fluids and pain control.  He is requesting admission to the hospital and does not feel he can go home.  His lipase is downtrending from previous values.  Observation admission discussed with Dr. Antionette Char.  Final Clinical Impressions(s) / ED Diagnoses   Final diagnoses:  Idiopathic acute pancreatitis without infection or necrosis    ED Discharge Orders    None       Rupa Lagan, Jeannett Senior, MD 07/30/17 0800

## 2017-07-30 NOTE — H&P (Signed)
History and Physical    Nathaniel FilbertJonathan [name] AOZ:[id number] DOB: 07/31/1991 DOA: 07/29/2017  PCP: Patient, No Pcp Per   Patient coming from: Home  Chief Complaint: Severe epigastric pain, nausea   HPI: Nathaniel FilbertJonathan [name] is a 26 y.o. male with medical history significant for alcoholism in remission, paroxysmal atrial fibrillation status post ablation, chronic systolic CHF that reportedly resolved after sinus rhythm was restored, and recurrent pancreatitis with known pseudocyst, now presenting to the emergency department for evaluation of severe epigastric pain and nausea.  Patient reports that he been in his usual state of health until several days ago when he noted the insidious development of severe pain in the epigastrium with nausea.  He was seen in the emergency department at another hospital 3 days ago for this, provided analgesia and antiemetics in the emergency department, and then felt well enough to go home.  Pain began to worsen again the following day and has again become severe.  He reports associated nausea, but has not vomited.  Pain is severe, waxing and waning, radiating from the epigastrium around to the right upper quadrant, worse with any oral intake, and fevers or chills.  Denies any chest pain.  Reports that he has not drank alcohol in more than a year.  ED Course: Upon arrival to the ED, patient is found to be afebrile, saturating well on room air, and with vitals otherwise stable.  Lipase level is elevated to 325 and chemistries are otherwise normal.  CBC features a leukocytosis to 11,000.  CT of the abdomen and pelvis reveals acute edematous pancreatitis with unchanged pseudocyst.  Patient was given normal saline bolus, Zofran, and Dilaudid in the ED.  He remained hemodynamically stable, has not been in any apparent respiratory distress, and will be admitted to the medical/surgical unit for ongoing evaluation and management of acute pancreatitis.  Review of Systems:  All other  systems reviewed and apart from HPI, are negative.  Past Medical History:  Diagnosis Date  . Atrial flutter (HCC) 01/17/2016   Admx 5/17 with AFlutter with RVR in setting of pancreatitis // CHADS2-VASc=1 (CHF) // Anticoag not started // Amiodarone >> NSR   . Chronic lower back pain   . Chronic systolic CHF (congestive heart failure) (HCC)    Echo 01/17/16 - EF 35-40%, diffuse HK, mild LAE   . 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HCC) 02/03/2016   a. EF 35-40% in setting of AFlutter with RVR in 5/17 // probably tachycardia mediated // needs FU echo 2-3 mos after NSR restored // b. Limited Echo 7/17: EF 45-50%, diff HK, mild LAE  . Depression    "last 2-3 months; never treated for it" (12/09/2016)  . Fatty liver 12/2015   per CT  . Headache    "monthly" (12/09/2016)  . Obesity    BMI 45 12/2015  . Pancreatitis 2012   recurrent acute. [facility] ct then shows pseudocyst.   . Pancreatitis 2012 X 2; 2017 X 2  . Paroxysmal atrial fibrillation (HCC)   . Typical atrial flutter (HCC) 06/06/2017    Past Surgical History:  Procedure Laterality Date  . ATRIAL FIBRILLATION ABLATION N/A 12/09/2016   Procedure: Atrial Fibrillation Ablation;  Surgeon: Hillis RangeJames Allred, MD;  [facility];  Service: Cardiovascular;  Laterality: N/A;  . FRACTURE SURGERY    . HAND SURGERY Right ~ 2008   "had pins in it; pulled them"  . TEE WITHOUT CARDIOVERSION N/A 12/08/2016   Procedure: TRANSESOPHAGEAL ECHOCARDIOGRAM (TEE);  Surgeon: Jake Bathe, MD;  Location: [facility] ENDOSCOPY;  Service: Cardiovascular;  Laterality: N/A;  . TONSILLECTOMY AND ADENOIDECTOMY     "in my teens"     reports that he has quit smoking. His smoking use included cigarettes. He smoked 0.50 packs per day. His smokeless tobacco use includes snuff. He reports that he drinks alcohol. He reports that he uses drugs. Drug: Marijuana.  No Known Allergies  Family History  Problem Relation Age of Onset  .  Diabetes Mother      Prior to Admission medications   Medication Sig Start Date End Date Taking? Authorizing Provider  diltiazem (CARDIZEM) 30 MG tablet Take 1 tablet every 4 hours AS NEEDED for rapid afib heart rate >100 01/10/17   Newman Nip, NP  ELIQUIS 5 MG TABS tablet TAKE 1 TABLET BY MOUTH TWICE DAILY 03/28/17   Allred, Fayrene Fearing, MD  flecainide (TAMBOCOR) 100 MG tablet TAKE 1 TABLET(100 MG) BY MOUTH TWICE DAILY 06/27/17   Newman Nip, NP  HYDROcodone-acetaminophen (NORCO/VICODIN) 5-325 MG tablet Take 2 tablets by mouth every 4 (four) hours as needed. 07/26/17   Bethel Born, PA-C  lisinopril (PRINIVIL,ZESTRIL) 5 MG tablet TAKE 1 TABLET(5 MG) BY MOUTH DAILY 03/07/17   Duke Salvia, MD  magnesium oxide (MAG-OX) 400 (241.3 Mg) MG tablet Take 1 tablet (400 mg total) by mouth daily. 06/06/17   Dunn, Tacey Ruiz, PA-C  metoprolol succinate (TOPROL-XL) 25 MG 24 hr tablet TAKE 1 TABLET(25 MG) BY MOUTH DAILY 05/05/17   Allred, Fayrene Fearing, MD  ondansetron (ZOFRAN ODT) 4 MG disintegrating tablet Take 1 tablet (4 mg total) by mouth every 8 (eight) hours as needed for nausea or vomiting. 07/26/17   Bethel Born, PA-C  potassium chloride SA (K-DUR,KLOR-CON) 20 MEQ tablet Take 1 tablet (20 mEq total) by mouth daily. 06/06/17   Dunn, Tacey Ruiz, PA-C  ranitidine (ZANTAC) 150 MG capsule Take 150 mg by mouth 2 (two) times daily.    [provider]  triamcinolone cream (KENALOG) 0.1 % Apply 1 application topically every evening.    [provider]    Physical Exam: Vitals:   07/29/17 2321 07/30/17 0247  BP: 132/75 (!) 108/52  Pulse: 89 73  Resp: 16 18  Temp: 98.2 F (36.8 C)   TempSrc: Oral   SpO2: 96% 97%  Weight: (!) 145.2 kg (320 lb)   Height: 6' (1.829 m)       Constitutional: NAD, calm, in apparent discomfort Eyes: PERTLA, lids and conjunctivae normal ENMT: Mucous membranes are moist. Posterior pharynx clear of any exudate or lesions.   Neck: normal, supple, no  masses, no thyromegaly Respiratory: clear to auscultation bilaterally, no wheezing, no crackles. Normal respiratory effort.  Cardiovascular: S1 & S2 heard, regular rate and rhythm. No extremity edema. No significant JVD. Abdomen: No distension, soft, tender in epigastrium without rebound pain or guarding. Bowel sounds active.  Musculoskeletal: no clubbing / cyanosis. No joint deformity upper and lower extremities.   Skin: no significant rashes, lesions, ulcers. Warm, dry, well-perfused. Neurologic: CN 2-12 grossly intact. Sensation intact. Strength 5/5 in all 4 limbs.  Psychiatric:  Alert and oriented x 3. Calm, cooperative.     Labs on Admission: I have personally reviewed following labs and imaging studies  CBC: Recent Labs  Lab 07/26/17 1657 07/30/17 0037  WBC 8.4 11.0*  NEUTROABS  --  8.1*  HGB 14.3 14.0  HCT 43.3 43.1  MCV 87.8 89.8  PLT 269 243   Basic Metabolic  Panel: Recent Labs  Lab 07/26/17 1657 07/30/17 0037  NA 140 137  K 3.8 3.8  CL 110 105  CO2 22 26  GLUCOSE 129* 98  BUN 12 12  CREATININE 0.70 0.72  CALCIUM 8.8* 9.0   GFR: Estimated Creatinine Clearance: 205.2 mL/min (by C-G formula based on SCr of 0.72 mg/dL). Liver Function Tests: Recent Labs  Lab 07/26/17 1657 07/30/17 0037  AST 31 33  ALT 40 40  ALKPHOS 40 45  BILITOT 0.9 1.1  PROT 6.8 7.9  ALBUMIN 3.7 4.3   Recent Labs  Lab 07/26/17 1657 07/30/17 0037  LIPASE 1,960* 325*   No results for input(s): AMMONIA in the last 168 hours. Coagulation Profile: No results for input(s): INR, PROTIME in the last 168 hours. Cardiac Enzymes: No results for input(s): CKTOTAL, CKMB, CKMBINDEX, TROPONINI in the last 168 hours. BNP (last 3 results) No results for input(s): PROBNP in the last 8760 hours. HbA1C: No results for input(s): HGBA1C in the last 72 hours. CBG: No results for input(s): GLUCAP in the last 168 hours. Lipid Profile: No results for input(s): CHOL, HDL, LDLCALC, TRIG, CHOLHDL,  LDLDIRECT in the last 72 hours. Thyroid Function Tests: No results for input(s): TSH, T4TOTAL, FREET4, T3FREE, THYROIDAB in the last 72 hours. Anemia Panel: No results for input(s): VITAMINB12, FOLATE, FERRITIN, TIBC, IRON, RETICCTPCT in the last 72 hours. Urine analysis:    Component Value Date/Time   COLORURINE AMBER (A) 07/29/2017 2354   APPEARANCEUR HAZY (A) 07/29/2017 2354   LABSPEC 1.030 07/29/2017 2354   PHURINE 5.0 07/29/2017 2354   GLUCOSEU NEGATIVE 07/29/2017 2354   HGBUR NEGATIVE 07/29/2017 2354   BILIRUBINUR NEGATIVE 07/29/2017 2354   KETONESUR 5 (A) 07/29/2017 2354   PROTEINUR 30 (A) 07/29/2017 2354   UROBILINOGEN 1.0 01/14/2014 0032   NITRITE NEGATIVE 07/29/2017 2354   LEUKOCYTESUR MODERATE (A) 07/29/2017 2354   Sepsis Labs: @LABRCNTIP (procalcitonin:4,lacticidven:4) )No results found for this or any previous visit (from the past 240 hour(s)).   Radiological Exams on Admission: Ct Abdomen Pelvis W Contrast  Result Date: 07/30/2017 CLINICAL DATA:  Right-sided abdominal pain. EXAM: CT ABDOMEN AND PELVIS WITH CONTRAST TECHNIQUE: Multidetector CT imaging of the abdomen and pelvis was performed using the standard protocol following bolus administration of intravenous contrast. CONTRAST:  ISOVUE-300 IOPAMIDOL (ISOVUE-300) INJECTION 61% COMPARISON:  CT 06/04/2017 FINDINGS: Lower chest: Left basilar atelectasis or scarring. No pleural fluid. Hepatobiliary: Prominent liver spanning 20 cm cranial caudal. No focal lesion. Gallbladder physiologically distended, no calcified stone. No biliary dilatation. Pancreas: Peripancreatic stranding diffusely about the pancreas. Thick-walled chronic pseudocyst abutting the gastric antrum appears similar in size measuring 4.0 x 3.2 cm, however now as surrounding soft tissue stranding. Multiple small peripancreatic nodes. No evidence of pancreatic necrosis. Punctate pancreatic calcification in the body. No new fluid collection. Spleen: Normal in  size without focal abnormality. Adrenals/Urinary Tract: No adrenal nodule. No hydronephrosis or perinephric edema. Homogeneous renal enhancement. Urinary bladder is physiologically distended without wall thickening. Stomach/Bowel: Stomach physiologically distended. Mild stranding about the gastric antrum likely secondary to pancreatic inflammation. No bowel obstruction, wall thickening or inflammation. Normal appendix. Small volume colonic stool. Vascular/Lymphatic: Multiple small upper abdominal peripancreatic nodes have increased, likely reactive. Prominent left mesenteric nodes are again seen. The splenic and portal veins are patent. Normal caliber abdominal aorta. Reproductive: Prostate is unremarkable. Other: No significant ascites. Abdominal fluid collection. No free air. Minimal fat in the inguinal canals. Musculoskeletal: There are no acute or suspicious osseous abnormalities. IMPRESSION: 1. Acute edematous pancreatitis.  Chronic right upper quadrant pseudocyst unchanged in size, however also with surrounding inflammation. No new peripancreatic fluid collection or evidence of pancreatic necrosis. 2. Increased number of upper abdominal lymph nodes, likely reactive. Prominent left mesenteric nodes are unchanged. Electronically Signed   By: Rubye Oaks M.D.   On: 07/30/2017 02:57    EKG: Not performed.   Assessment/Plan  1. Acute pancreatitis  - Pt presents with severe epigastric pain and nausea, noted to have elevated lipase level and CT-findings consistent with acute pancreatitis  - Pseudocyst appears unchanged on CT and there is no fever or apparent infection   - Etiology not clear; pt reports no EtOH in over a year, no CBD dilation or stones noted on imaging, had normal triglycerides a year ago, and is not on any of the more commonly implicated medications (though ranitidine has been associated)  - Plan to continue bowel rest, IVF hydration, analgesia, antiemetics    2. Paroxysmal atrial  fibrillation - In a regular rhythm on admission - Status-post ablation, following with cardiology and, per recent notes, may be taken off of anticoagulation if remains in sinus rhythm at next office visit  - CHADS-VASc may only be 1 for HTN (CHF reportedly resolved after the ablation and return to sinus rhythm) - Continue Eliquis and flecainide     DVT prophylaxis: Eliquis Code Status: Full  Family Communication: Discussed with patient Disposition Plan: Admit to med-surg Consults called: None Admission status: Inpatient    Briscoe Deutscher, MD [facility] Pager [phone]  If 7PM-7AM, please contact night-coverage [email or web address] Password [email or web address]  07/30/2017, 3:59 AM

## 2017-07-31 DIAGNOSIS — I5022 Chronic systolic (congestive) heart failure: Secondary | ICD-10-CM

## 2017-07-31 LAB — CBC
HCT: 40 % (ref 39.0–52.0)
Hemoglobin: 12.9 g/dL — ABNORMAL LOW (ref 13.0–17.0)
MCH: 28.9 pg (ref 26.0–34.0)
MCHC: 32.3 g/dL (ref 30.0–36.0)
MCV: 89.7 fL (ref 78.0–100.0)
Platelets: 244 10*3/uL (ref 150–400)
RBC: 4.46 MIL/uL (ref 4.22–5.81)
RDW: 12.6 % (ref 11.5–15.5)
WBC: 7.8 10*3/uL (ref 4.0–10.5)

## 2017-07-31 MED ORDER — SODIUM CHLORIDE 0.9 % IV SOLN
INTRAVENOUS | Status: AC
  Administered 2017-07-31: 21:00:00 via INTRAVENOUS

## 2017-07-31 MED ORDER — SODIUM CHLORIDE 0.9 % IV SOLN
INTRAVENOUS | Status: AC
  Administered 2017-07-31: 11:00:00 via INTRAVENOUS

## 2017-07-31 NOTE — Progress Notes (Signed)
Patient complains of increased pain to right upper abdomen, rated 10/10 and described as sharp/ stabbing pain,  patient was given dilaudid 1 mg and notified Dr. Debbra Riding of patient's increased pain.  Dr. Debbra Riding stated she would see the patient this morning.

## 2017-07-31 NOTE — Progress Notes (Signed)
PROGRESS NOTE    Nathaniel [name]  IRS:[id number] DOB: [birth year] DOA: 07/29/2017 PCP: Patient, No Pcp Per    Brief Narrative:  Nathaniel [name] is a 26 y.o. male with medical history significant for alcoholism in remission, paroxysmal atrial fibrillation status post ablation, chronic systolic CHF that reportedly resolved after sinus rhythm was restored, and recurrent pancreatitis with known pseudocyst, now presenting to the emergency department for evaluation of severe epigastric pain and nausea.  Patient reports that he been in his usual state of health until several days ago when he noted the insidious development of severe pain in the epigastrium with nausea.  He was seen in the emergency department at another hospital 3 days ago for this, provided analgesia and antiemetics in the emergency department, and then felt well enough to go home.  Pain began to worsen again the following day and has again become severe.  He reports associated nausea, but has not vomited.  Pain is severe, waxing and waning, radiating from the epigastrium around to the right upper quadrant, worse with any oral intake, and fevers or chills.  Denies any chest pain.  Reports that he has not drank alcohol in more than a year.  ED Course: Upon arrival to the ED, patient is found to be afebrile, saturating well on room air, and with vitals otherwise stable.  Lipase level is elevated to 325 and chemistries are otherwise normal.  CBC features a leukocytosis to 11,000.  CT of the abdomen and pelvis reveals acute edematous pancreatitis with unchanged pseudocyst.  Patient was given normal saline bolus, Zofran, and Dilaudid in the ED.  He remained hemodynamically stable, has not been in any apparent respiratory distress, and will be admitted to the medical/surgical unit for ongoing evaluation and management of acute pancreatitis.     Assessment & Plan:   Principal Problem:   Acute pancreatitis without infection or  necrosis Active Problems:   Alcohol abuse, in remission   Chronic systolic CHF (congestive heart failure) (HCC)   Acute pancreatitis   Paroxysmal atrial fibrillation (HCC)   Acute pancreatitis  - Pt presents with severe epigastric pain and nausea, noted to have elevated lipase level and CT-findings consistent with acute pancreatitis  - Pseudocyst appears unchanged on CT and there is no fever or apparent infection   - Etiology not clear; pt reports no EtOH in over a year, no CBD dilation or stones noted on imaging, had normal triglycerides a year ago -Continue IV fluids -Continue antiemetics and analgesia  - N.p.o. except for meds  Paroxysmal atrial fibrillation - In a regular rhythm on admission - Status-post ablation, following with cardiology and, per recent notes, may be taken off of anticoagulation if remains in sinus rhythm at next office visit  - CHADS-VASc may only be 1 for HTN (CHF reportedly resolved after the ablation and return to sinus rhythm) - Continue flecainide     DVT prophylaxis: Eliquis Code Status: Full Code Family Communication: No family bedside Disposition Plan: pending improvement in abdominal pain and then tolerance of diet   Consultants:   None  Procedures:   None  Antimicrobials:   none    Subjective: Patient reports increase in pain this morning.  He says that the Dilaudid is helping manage his pain.  But he is asking if he can be seen by a gastroenterologist during his admission.  Asleep on initial time of exam.  Objective: Vitals:   07/30/17 1512 07/30/17 2051 07/30/17 2151 07/31/17 0501  BP: (!) 99/42  [name])  113/50 (!) 94/55  Pulse: 77  90 76  Resp: 18  20 20   Temp: 98 F (36.7 C)  98.8 F (37.1 C) 97.9 F (36.6 C)  TempSrc:   Oral Oral  SpO2: 95% 95% 97% 96%  Weight:    (!) 147.9 kg (326 lb 0.3 oz)  Height:        Intake/Output Summary (Last 24 hours) at 07/31/2017 1141 Last data filed at 07/31/2017 0900 Gross per 24 hour   Intake 2347.5 ml  Output 475 ml  Net 1872.5 ml   Filed Weights   07/29/17 2321 07/30/17 0500 07/31/17 0501  Weight: (!) 145.2 kg (320 lb) (!) 147.9 kg (326 lb 1 oz) (!) 147.9 kg (326 lb 0.3 oz)    Examination:  General exam: Appears calm and comfortable  Respiratory system: Clear to auscultation. Respiratory effort normal. Cardiovascular system: S1 & S2 heard, RRR. No JVD, murmurs, rubs, gallops or clicks. No pedal edema. Gastrointestinal system: Abdomen is nondistended, soft and tender particularly in the epigastrium. No organomegaly or masses felt. Normal bowel sounds heard. Central nervous system: Alert and oriented. No focal neurological deficits. Extremities: Symmetric 5 x 5 power. Skin: No rashes, lesions or ulcers Psychiatry: Judgement and insight appear normal. Mood & affect appropriate.     Data Reviewed: I have personally reviewed following labs and imaging studies  CBC: Recent Labs  Lab 07/26/17 1657 07/30/17 0037 07/30/17 0802  WBC 8.4 11.0* 9.1  NEUTROABS  --  8.1* 6.5  HGB 14.3 14.0 12.9*  HCT 43.3 43.1 39.8  MCV 87.8 89.8 90.2  PLT 269 243 221   Basic Metabolic Panel: Recent Labs  Lab 07/26/17 1657 07/30/17 0037 07/30/17 0802  NA 140 137 138  K 3.8 3.8 4.3  CL 110 105 106  CO2 22 26 25   GLUCOSE 129* 98 87  BUN 12 12 10   CREATININE 0.70 0.72 0.59*  CALCIUM 8.8* 9.0 8.4*   GFR: Estimated Creatinine Clearance: 207.4 mL/min (A) (by C-G formula based on SCr of 0.59 mg/dL (L)). Liver Function Tests: Recent Labs  Lab 07/26/17 1657 07/30/17 0037 07/30/17 0802  AST 31 33 28  ALT 40 40 36  ALKPHOS 40 45 39  BILITOT 0.9 1.1 1.2  PROT 6.8 7.9 7.0  ALBUMIN 3.7 4.3 3.7   Recent Labs  Lab 07/26/17 1657 07/30/17 0037 07/30/17 0802  LIPASE 1,960* 325* 102*   No results for input(s): AMMONIA in the last 168 hours. Coagulation Profile: No results for input(s): INR, PROTIME in the last 168 hours. Cardiac Enzymes: No results for input(s):  CKTOTAL, CKMB, CKMBINDEX, TROPONINI in the last 168 hours. BNP (last 3 results) No results for input(s): PROBNP in the last 8760 hours. HbA1C: No results for input(s): HGBA1C in the last 72 hours. CBG: No results for input(s): GLUCAP in the last 168 hours. Lipid Profile: No results for input(s): CHOL, HDL, LDLCALC, TRIG, CHOLHDL, LDLDIRECT in the last 72 hours. Thyroid Function Tests: No results for input(s): TSH, T4TOTAL, FREET4, T3FREE, THYROIDAB in the last 72 hours. Anemia Panel: No results for input(s): VITAMINB12, FOLATE, FERRITIN, TIBC, IRON, RETICCTPCT in the last 72 hours. Sepsis Labs: No results for input(s): PROCALCITON, LATICACIDVEN in the last 168 hours.  No results found for this or any previous visit (from the past 240 hour(s)).       Radiology Studies: Ct Abdomen Pelvis W Contrast  Result Date: 07/30/2017 CLINICAL DATA:  Right-sided abdominal pain. EXAM: CT ABDOMEN AND PELVIS WITH CONTRAST TECHNIQUE: Multidetector CT imaging  of the abdomen and pelvis was performed using the standard protocol following bolus administration of intravenous contrast. CONTRAST:  100mL ISOVUE-300 IOPAMIDOL (ISOVUE-300) INJECTION 61% COMPARISON:  CT 06/04/2017 FINDINGS: Lower chest: Left basilar atelectasis or scarring. No pleural fluid. Hepatobiliary: Prominent liver spanning 20 cm cranial caudal. No focal lesion. Gallbladder physiologically distended, no calcified stone. No biliary dilatation. Pancreas: Peripancreatic stranding diffusely about the pancreas. Thick-walled chronic pseudocyst abutting the gastric antrum appears similar in size measuring 4.0 x 3.2 cm, however now as surrounding soft tissue stranding. Multiple small peripancreatic nodes. No evidence of pancreatic necrosis. Punctate pancreatic calcification in the body. No new fluid collection. Spleen: Normal in size without focal abnormality. Adrenals/Urinary Tract: No adrenal nodule. No hydronephrosis or perinephric edema. Homogeneous  renal enhancement. Urinary bladder is physiologically distended without wall thickening. Stomach/Bowel: Stomach physiologically distended. Mild stranding about the gastric antrum likely secondary to pancreatic inflammation. No bowel obstruction, wall thickening or inflammation. Normal appendix. Small volume colonic stool. Vascular/Lymphatic: Multiple small upper abdominal peripancreatic nodes have increased, likely reactive. Prominent left mesenteric nodes are again seen. The splenic and portal veins are patent. Normal caliber abdominal aorta. Reproductive: Prostate is unremarkable. Other: No significant ascites. Abdominal fluid collection. No free air. Minimal fat in the inguinal canals. Musculoskeletal: There are no acute or suspicious osseous abnormalities. IMPRESSION: 1. Acute edematous pancreatitis. Chronic right upper quadrant pseudocyst unchanged in size, however also with surrounding inflammation. No new peripancreatic fluid collection or evidence of pancreatic necrosis. 2. Increased number of upper abdominal lymph nodes, likely reactive. Prominent left mesenteric nodes are unchanged. Electronically Signed   By: Rubye OaksMelanie  Ehinger M.D.   On: 07/30/2017 02:57        Scheduled Meds: . apixaban  5 mg Oral BID  . flecainide  100 mg Oral Q12H  . lisinopril  5 mg Oral Daily   Continuous Infusions: . sodium chloride 125 mL/hr at 07/31/17 1039  . famotidine (PEPCID) IV Stopped (07/31/17 1007)     LOS: 1 day    Time spent: 30 minutes    Katrinka BlazingAlex U Kadolph, MD [facility] Pager [phone]  If 7PM-7AM, please contact night-coverage [email or web address] Password [email or web address] 07/31/2017, 11:41 AM

## 2017-08-01 LAB — CBC WITH DIFFERENTIAL/PLATELET
Basophils Absolute: 0 10*3/uL (ref 0.0–0.1)
Basophils Relative: 0 %
Eosinophils Absolute: 0.2 10*3/uL (ref 0.0–0.7)
Eosinophils Relative: 4 %
HCT: 36.5 % — ABNORMAL LOW (ref 39.0–52.0)
Hemoglobin: 11.7 g/dL — ABNORMAL LOW (ref 13.0–17.0)
Lymphocytes Relative: 31 %
Lymphs Abs: 1.8 10*3/uL (ref 0.7–4.0)
MCH: 28.7 pg (ref 26.0–34.0)
MCHC: 32.1 g/dL (ref 30.0–36.0)
MCV: 89.7 fL (ref 78.0–100.0)
Monocytes Absolute: 0.6 10*3/uL (ref 0.1–1.0)
Monocytes Relative: 10 %
Neutro Abs: 3.2 10*3/uL (ref 1.7–7.7)
Neutrophils Relative %: 55 %
Platelets: 246 10*3/uL (ref 150–400)
RBC: 4.07 MIL/uL — ABNORMAL LOW (ref 4.22–5.81)
RDW: 12.4 % (ref 11.5–15.5)
WBC: 5.9 10*3/uL (ref 4.0–10.5)

## 2017-08-01 LAB — COMPREHENSIVE METABOLIC PANEL
ALT: 22 U/L (ref 17–63)
AST: 17 U/L (ref 15–41)
Albumin: 3.2 g/dL — ABNORMAL LOW (ref 3.5–5.0)
Alkaline Phosphatase: 34 U/L — ABNORMAL LOW (ref 38–126)
Anion gap: 8 (ref 5–15)
BUN: 9 mg/dL (ref 6–20)
CO2: 21 mmol/L — ABNORMAL LOW (ref 22–32)
Calcium: 8.1 mg/dL — ABNORMAL LOW (ref 8.9–10.3)
Chloride: 107 mmol/L (ref 101–111)
Creatinine, Ser: 0.52 mg/dL — ABNORMAL LOW (ref 0.61–1.24)
GFR calc Af Amer: >60 mL/min (ref >60)
GFR calc non Af Amer: >60 mL/min (ref >60)
Glucose, Bld: 70 mg/dL (ref 65–99)
Potassium: 3.6 mmol/L (ref 3.5–5.1)
Sodium: 136 mmol/L (ref 135–145)
Total Bilirubin: 1.1 mg/dL (ref 0.3–1.2)
Total Protein: 6.6 g/dL (ref 6.5–8.1)

## 2017-08-01 MED ORDER — SODIUM CHLORIDE 0.9 % IV SOLN
INTRAVENOUS | Status: AC
  Administered 2017-08-01: 13:00:00 via INTRAVENOUS

## 2017-08-01 NOTE — Progress Notes (Signed)
PROGRESS NOTE    Nathaniel [name]  YME:[id number] DOB: Jun 24, 1991 DOA: 07/29/2017 PCP: Patient, No Pcp Per    Brief Narrative:  Nathaniel [name] is a 26 y.o. male with medical history significant for alcoholism in remission, paroxysmal atrial fibrillation status post ablation, chronic systolic CHF that reportedly resolved after sinus rhythm was restored, and recurrent pancreatitis with known pseudocyst, now presenting to the emergency department for evaluation of severe epigastric pain and nausea.  Patient reports that he been in his usual state of health until several days ago when he noted the insidious development of severe pain in the epigastrium with nausea.  He was seen in the emergency department at another hospital 3 days ago for this, provided analgesia and antiemetics in the emergency department, and then felt well enough to go home.  Pain began to worsen again the following day and has again become severe.  He reports associated nausea, but has not vomited.  Pain is severe, waxing and waning, radiating from the epigastrium around to the right upper quadrant, worse with any oral intake, and fevers or chills.  Denies any chest pain.  Reports that he has not drank alcohol in more than a year.  ED Course: Upon arrival to the ED, patient is found to be afebrile, saturating well on room air, and with vitals otherwise stable.  Lipase level is elevated to 325 and chemistries are otherwise normal.  CBC features a leukocytosis to 11,000.  CT of the abdomen and pelvis reveals acute edematous pancreatitis with unchanged pseudocyst.  Patient was given normal saline bolus, Zofran, and Dilaudid in the ED.  He remained hemodynamically stable, has not been in any apparent respiratory distress, and will be admitted to the medical/surgical unit for ongoing evaluation and management of acute pancreatitis.     Assessment & Plan:   Principal Problem:   Acute pancreatitis without infection or  necrosis Active Problems:   Alcohol abuse, in remission   Chronic systolic CHF (congestive heart failure) (HCC)   Acute pancreatitis   Paroxysmal atrial fibrillation (HCC)   Acute pancreatitis  - Pt presents with severe epigastric pain and nausea, noted to have elevated lipase level and CT-findings consistent with acute pancreatitis  - Pseudocyst appears unchanged on CT and there is no fever or apparent infection   - Etiology not clear; pt reports no EtOH in over a year, no CBD dilation or stones noted on imaging, had normal triglycerides a year ago -Continue IV fluids -Continue antiemetics and analgesia  - Patient to try sips of water today.  He understands that if he does not have abdominal pain we can advance him to clear liquids.  He voices he is concerned that his abdominal pain will return with sips of water.  Paroxysmal atrial fibrillation - Status-post ablation, following with cardiology and, per recent notes, may be taken off of anticoagulation if remains in sinus rhythm at next office visit  - CHADS-VASc may only be 1 for HTN (CHF reportedly resolved after the ablation and return to sinus rhythm) - Continue flecainide and Eliquis     DVT prophylaxis: Eliquis Code Status: Full Code Family Communication: No family bedside Disposition Plan: pending improvement in abdominal pain and then tolerance of diet   Consultants:   None  Procedures:   None  Antimicrobials:   none    Subjective: Patient laying watching television.  He denies having any current abdominal pain.  States he had abdominal pain when he took his p.o. medications this morning but believes  the pain actually came from the IV Pepcid that he received.  Objective: Vitals:   07/31/17 0501 07/31/17 1530 07/31/17 2223 08/01/17 0511  BP: (!) 94/55 (!) 126/59 129/70 (!) 100/54  Pulse: 76 81 73 65  Resp: 20  20 18   Temp: 97.9 F (36.6 C) 98.1 F (36.7 C) 98.3 F (36.8 C) 98.4 F (36.9 C)  TempSrc:  Oral Oral Oral Oral  SpO2: 96% 96% 97% 95%  Weight: (!) 147.9 kg (326 lb 0.3 oz)   (!) 145.6 kg (320 lb 14.4 oz)  Height:        Intake/Output Summary (Last 24 hours) at 08/01/2017 16100712 Last data filed at 08/01/2017 0300 Gross per 24 hour  Intake 1229.17 ml  Output 400 ml  Net 829.17 ml   Filed Weights   07/30/17 0500 07/31/17 0501 08/01/17 0511  Weight: (!) 147.9 kg (326 lb 1 oz) (!) 147.9 kg (326 lb 0.3 oz) (!) 145.6 kg (320 lb 14.4 oz)    Examination:  General exam: Appears calm and comfortable  Respiratory system: Clear to auscultation. Respiratory effort normal. Cardiovascular system: S1 & S2 heard, RRR. No JVD, murmurs, rubs, gallops or clicks. No pedal edema. Gastrointestinal system: Abdomen is nondistended, soft and t tender on deep palpation in the epigastrium as well as right upper quadrant.  No organomegaly or masses felt. Normal bowel sounds heard. Central nervous system: Alert and oriented. No focal neurological deficits. Extremities: Symmetric 5 x 5 power. Skin: No rashes, lesions or ulcers Psychiatry: Judgement and insight appear normal. Mood & affect appropriate.     Data Reviewed: I have personally reviewed following labs and imaging studies  CBC: Recent Labs  Lab 07/26/17 1657 07/30/17 0037 07/30/17 0802 07/31/17 1408 08/01/17 0407  WBC 8.4 11.0* 9.1 7.8 5.9  NEUTROABS  --  8.1* 6.5  --  3.2  HGB 14.3 14.0 12.9* 12.9* 11.7*  HCT 43.3 43.1 39.8 40.0 36.5*  MCV 87.8 89.8 90.2 89.7 89.7  PLT 269 243 221 244 246   Basic Metabolic Panel: Recent Labs  Lab 07/26/17 1657 07/30/17 0037 07/30/17 0802 08/01/17 0407  NA 140 137 138 136  K 3.8 3.8 4.3 3.6  CL 110 105 106 107  CO2 22 26 25  21*  GLUCOSE 129* 98 87 70  BUN 12 12 10 9   CREATININE 0.70 0.72 0.59* 0.52*  CALCIUM 8.8* 9.0 8.4* 8.1*   GFR: Estimated Creatinine Clearance: 205.6 mL/min (A) (by C-G formula based on SCr of 0.52 mg/dL (L)). Liver Function Tests: Recent Labs  Lab  07/26/17 1657 07/30/17 0037 07/30/17 0802 08/01/17 0407  AST 31 33 28 17  ALT 40 40 36 22  ALKPHOS 40 45 39 34*  BILITOT 0.9 1.1 1.2 1.1  PROT 6.8 7.9 7.0 6.6  ALBUMIN 3.7 4.3 3.7 3.2*   Recent Labs  Lab 07/26/17 1657 07/30/17 0037 07/30/17 0802  LIPASE 1,960* 325* 102*   No results for input(s): AMMONIA in the last 168 hours. Coagulation Profile: No results for input(s): INR, PROTIME in the last 168 hours. Cardiac Enzymes: No results for input(s): CKTOTAL, CKMB, CKMBINDEX, TROPONINI in the last 168 hours. BNP (last 3 results) No results for input(s): PROBNP in the last 8760 hours. HbA1C: No results for input(s): HGBA1C in the last 72 hours. CBG: No results for input(s): GLUCAP in the last 168 hours. Lipid Profile: No results for input(s): CHOL, HDL, LDLCALC, TRIG, CHOLHDL, LDLDIRECT in the last 72 hours. Thyroid Function Tests: No results for input(s): TSH, T4TOTAL,  FREET4, T3FREE, THYROIDAB in the last 72 hours. Anemia Panel: No results for input(s): VITAMINB12, FOLATE, FERRITIN, TIBC, IRON, RETICCTPCT in the last 72 hours. Sepsis Labs: No results for input(s): PROCALCITON, LATICACIDVEN in the last 168 hours.  No results found for this or any previous visit (from the past 240 hour(s)).       Radiology Studies: No results found.      Scheduled Meds: . apixaban  5 mg Oral BID  . flecainide  100 mg Oral Q12H  . lisinopril  5 mg Oral Daily   Continuous Infusions: . sodium chloride 125 mL/hr at 07/31/17 2119  . famotidine (PEPCID) IV Stopped (07/31/17 2140)     LOS: 2 days    Time spent: 25 minutes    Katrinka Blazing, MD [facility] Pager [phone]  If 7PM-7AM, please contact night-coverage [email or web address] Password [email or web address] 08/01/2017, 7:12 AM

## 2017-08-02 ENCOUNTER — Inpatient Hospital Stay (HOSPITAL_COMMUNITY): Payer: BLUE CROSS/BLUE SHIELD

## 2017-08-02 DIAGNOSIS — R066 Hiccough: Secondary | ICD-10-CM

## 2017-08-02 LAB — COMPREHENSIVE METABOLIC PANEL
ALT: 20 U/L (ref 17–63)
AST: 17 U/L (ref 15–41)
Albumin: 3.3 g/dL — ABNORMAL LOW (ref 3.5–5.0)
Alkaline Phosphatase: 33 U/L — ABNORMAL LOW (ref 38–126)
Anion gap: 6 (ref 5–15)
BUN: 7 mg/dL (ref 6–20)
CO2: 22 mmol/L (ref 22–32)
Calcium: 8.3 mg/dL — ABNORMAL LOW (ref 8.9–10.3)
Chloride: 110 mmol/L (ref 101–111)
Creatinine, Ser: 0.57 mg/dL — ABNORMAL LOW (ref 0.61–1.24)
GFR calc Af Amer: >60 mL/min (ref >60)
GFR calc non Af Amer: >60 mL/min (ref >60)
Glucose, Bld: 92 mg/dL (ref 65–99)
Potassium: 3.7 mmol/L (ref 3.5–5.1)
Sodium: 138 mmol/L (ref 135–145)
Total Bilirubin: 1.1 mg/dL (ref 0.3–1.2)
Total Protein: 6.7 g/dL (ref 6.5–8.1)

## 2017-08-02 LAB — LACTIC ACID, PLASMA
Lactic Acid, Venous: 1.2 mmol/L (ref 0.5–1.9)
Lactic Acid, Venous: 1 mmol/L (ref 0.5–1.9)

## 2017-08-02 LAB — LIPASE, BLOOD: Lipase: 734 U/L — ABNORMAL HIGH (ref 11–51)

## 2017-08-02 MED ORDER — PANCRELIPASE (LIP-PROT-AMYL) 12000-38000 UNITS PO CPEP
24000.0000 [IU] | ORAL_CAPSULE | Freq: Three times a day (TID) | ORAL | Status: DC
  Administered 2017-08-02 – 2017-08-05 (×6): 24000 [IU] via ORAL
  Filled 2017-08-02 (×7): qty 2

## 2017-08-02 MED ORDER — IOPAMIDOL (ISOVUE-300) INJECTION 61%
INTRAVENOUS | Status: AC
  Administered 2017-08-02: 30 mL via ORAL
  Filled 2017-08-02: qty 30

## 2017-08-02 MED ORDER — HYDROMORPHONE HCL 1 MG/ML IJ SOLN
1.0000 mg | INTRAMUSCULAR | Status: DC | PRN
Start: 2017-08-02 — End: 2017-08-03
  Administered 2017-08-02 (×3): 1 mg via INTRAVENOUS
  Filled 2017-08-02 (×3): qty 1

## 2017-08-02 MED ORDER — SODIUM CHLORIDE 0.9 % IV SOLN
INTRAVENOUS | Status: AC
Start: 2017-08-02 — End: 2017-08-03
  Administered 2017-08-02 – 2017-08-03 (×2): via INTRAVENOUS

## 2017-08-02 MED ORDER — METOCLOPRAMIDE HCL 5 MG/ML IJ SOLN
5.0000 mg | Freq: Four times a day (QID) | INTRAMUSCULAR | Status: DC | PRN
  Administered 2017-08-02 (×2): 5 mg via INTRAVENOUS
  Filled 2017-08-02 (×2): qty 2

## 2017-08-02 MED ORDER — HYDROMORPHONE HCL 1 MG/ML IJ SOLN
1.5000 mg | Freq: Once | INTRAMUSCULAR | Status: AC
  Administered 2017-08-02: 1.5 mg via INTRAVENOUS
  Filled 2017-08-02: qty 2

## 2017-08-02 MED ORDER — HYDROMORPHONE HCL 1 MG/ML IJ SOLN
2.0000 mg | Freq: Once | INTRAMUSCULAR | Status: AC
  Administered 2017-08-02: 2 mg via INTRAVENOUS
  Filled 2017-08-02: qty 2

## 2017-08-02 MED ORDER — PANTOPRAZOLE SODIUM 40 MG IV SOLR
40.0000 mg | Freq: Once | INTRAVENOUS | Status: AC
  Administered 2017-08-02: 40 mg via INTRAVENOUS
  Filled 2017-08-02: qty 40

## 2017-08-02 MED ORDER — KETOROLAC TROMETHAMINE 30 MG/ML IJ SOLN
30.0000 mg | Freq: Once | INTRAMUSCULAR | Status: AC
  Administered 2017-08-02: 30 mg via INTRAVENOUS
  Filled 2017-08-02: qty 1

## 2017-08-02 MED ORDER — IOPAMIDOL (ISOVUE-300) INJECTION 61%
100.0000 mL | Freq: Once | INTRAVENOUS | Status: AC | PRN
  Administered 2017-08-02: 100 mL via INTRAVENOUS

## 2017-08-02 NOTE — Progress Notes (Signed)
Patient having persistent abdominal pain this afternoon. Received 2 mg IV dilaudid without relief. Tender abdomen diffusely  on minimal pressure. Vitals stable. Noted elevate lipase on labs this morning. Patient made NPO.  will check lactate and repeat CT abdomen to evaluate further.

## 2017-08-02 NOTE — Progress Notes (Signed)
PROGRESS NOTE                                                                                                                                                                                                             Patient Demographics:    Nathaniel [name], is a 26 y.o. male, DOB - 12-03-1990, ILN:[id number]  Admit date - 07/29/2017   Admitting Physician Briscoe Deutscher, MD  Outpatient Primary MD for the patient is Patient, No Pcp Per  LOS - 3  Outpatient Specialists:None  Chief Complaint  Patient presents with  . Abdominal Pain       Brief Narrative   26 year old male with History of Recurrent pancreatitis With pseudocyst(alcohol induced), Alcohol abuse in remission, paroxysmal A. Fib status post ablation , on anticoagulation, Chronic systolic CHF Presented with severe epigastric pain with nausea for past few days. He was seen in another hospital ED for similar symptoms 3 days back, given pain medication and antiemetics and was discharged home . I will reverse symptoms did not improve and patient presented to the ED. In the ED His vitals were stable. He had elevated lipase of 325, WBC of 11 K. CT of the abdomen and pelvis showed acute edematous pancreatitis with unchanged pseudocyst. Patient admitted for acute recurrent pancreatitis.   Subjective:   Patient reported significant epigastric pain this morning which resolved with pain medications. Tolerating clears. Denies any nausea vomiting.   Assessment  & Plan :    Principal Problem:   Acute pancreatitis without infection or necrosis Unclear what Triggered symptoms this time. Has been in alcohol remission for 1 year. Lipase worsened today (734) Continue IV fluids, pain control with when necessary IV Dilaudid, antiemetics. Serial abdominal exam. Tolerating clear liquid. Will add creon. If no further improvement or worsened will need repeat imaging.  Active  Problems:   Alcohol abuse, in remission Encouraged on cessation.    Chronic systolic CHF (congestive heart failure) (HCC) Euvolemic. Continue Lisinopril.     Paroxysmal atrial fibrillation (HCC) Rate controlled. Continue flecainide and eliquis.   Hiccups Added when necessary Reglan    Code Status : Full code  Family Communication  : wife At bedside  Disposition Plan  : Home once improved  Barriers For Discharge : Active symptoms  Consults  :  none  Procedures  :none  DVT Prophylaxis  :  eliquis  Lab Results  Component Value Date   PLT 246 08/01/2017    Antibiotics  :    Anti-infectives (From admission, onward)   None        Objective:   Vitals:   08/01/17 0511 08/01/17 2033 08/01/17 2156 08/02/17 0640  BP: (!) 100/54  117/66 129/73  Pulse: 65  75 74  Resp: 18  20 16   Temp: 98.4 F (36.9 C)  98.1 F (36.7 C) 97.6 F (36.4 C)  TempSrc: Oral  Oral Oral  SpO2: 95% 98% 99% 98%  Weight: (!) 145.6 kg (320 lb 14.4 oz)     Height:        Wt Readings from Last 3 Encounters:  08/01/17 (!) 145.6 kg (320 lb 14.4 oz)  07/26/17 (!) 145.2 kg (320 lb)  07/08/17 (!) 149.1 kg (328 lb 9.6 oz)     Intake/Output Summary (Last 24 hours) at 08/02/2017 1448 Last data filed at 08/01/2017 1900 Gross per 24 hour  Intake 752.5 ml  Output -  Net 752.5 ml     Physical Exam  Gen: not in distress HEENT: moist mucosa, supple neck Chest: clear b/l, no added sounds CVS: N S1&S2, no murmurs, rubs or gallop GI: soft, NT, ND, BS+ Musculoskeletal: warm, no edema    Data Review:    CBC Recent Labs  Lab 07/26/17 1657 07/30/17 0037 07/30/17 0802 07/31/17 1408 08/01/17 0407  WBC 8.4 11.0* 9.1 7.8 5.9  HGB 14.3 14.0 12.9* 12.9* 11.7*  HCT 43.3 43.1 39.8 40.0 36.5*  PLT 269 243 221 244 246  MCV 87.8 89.8 90.2 89.7 89.7  MCH 29.0 29.2 29.3 28.9 28.7  MCHC 33.0 32.5 32.4 32.3 32.1  RDW 12.9 12.6 12.8 12.6 12.4  LYMPHSABS  --  1.8 1.5  --  1.8  MONOABS  --  0.9  0.9  --  0.6  EOSABS  --  0.1 0.2  --  0.2  BASOSABS  --  0.0 0.0  --  0.0    Chemistries  Recent Labs  Lab 07/26/17 1657 07/30/17 0037 07/30/17 0802 08/01/17 0407 08/02/17 0842  NA 140 137 138 136 138  K 3.8 3.8 4.3 3.6 3.7  CL 110 105 106 107 110  CO2 22 26 25  21* 22  GLUCOSE 129* 98 87 70 92  BUN 12 12 10 9 7   CREATININE 0.70 0.72 0.59* 0.52* 0.57*  CALCIUM 8.8* 9.0 8.4* 8.1* 8.3*  AST 31 33 28 17 17   ALT 40 40 36 22 20  ALKPHOS 40 45 39 34* 33*  BILITOT 0.9 1.1 1.2 1.1 1.1   ------------------------------------------------------------------------------------------------------------------ No results for input(s): CHOL, HDL, LDLCALC, TRIG, CHOLHDL, LDLDIRECT in the last 72 hours.  No results found for: HGBA1C ------------------------------------------------------------------------------------------------------------------ No results for input(s): TSH, T4TOTAL, T3FREE, THYROIDAB in the last 72 hours.  Invalid input(s): FREET3 ------------------------------------------------------------------------------------------------------------------ No results for input(s): VITAMINB12, FOLATE, FERRITIN, TIBC, IRON, RETICCTPCT in the last 72 hours.  Coagulation profile No results for input(s): INR, PROTIME in the last 168 hours.  No results for input(s): DDIMER in the last 72 hours.  Cardiac Enzymes No results for input(s): CKMB, TROPONINI, MYOGLOBIN in the last 168 hours.  Invalid input(s): CK ------------------------------------------------------------------------------------------------------------------    Component Value Date/Time   BNP 146.9 (H) 01/19/2016 1306    Inpatient Medications  Scheduled Meds: . apixaban  5 mg Oral BID  . flecainide  100 mg Oral Q12H  . lisinopril  5 mg Oral Daily   Continuous Infusions: . famotidine (  PEPCID) IV Stopped (08/02/17 0952)   PRN Meds:.acetaminophen **OR** acetaminophen, HYDROmorphone (DILAUDID) injection, metoCLOPramide  (REGLAN) injection, ondansetron **OR** ondansetron (ZOFRAN) IV  Micro Results No results found for this or any previous visit (from the past 240 hour(s)).  Radiology Reports Ct Abdomen Pelvis W Contrast  Result Date: 07/30/2017 CLINICAL DATA:  Right-sided abdominal pain. EXAM: CT ABDOMEN AND PELVIS WITH CONTRAST TECHNIQUE: Multidetector CT imaging of the abdomen and pelvis was performed using the standard protocol following bolus administration of intravenous contrast. CONTRAST:  100mL ISOVUE-300 IOPAMIDOL (ISOVUE-300) INJECTION 61% COMPARISON:  CT 06/04/2017 FINDINGS: Lower chest: Left basilar atelectasis or scarring. No pleural fluid. Hepatobiliary: Prominent liver spanning 20 cm cranial caudal. No focal lesion. Gallbladder physiologically distended, no calcified stone. No biliary dilatation. Pancreas: Peripancreatic stranding diffusely about the pancreas. Thick-walled chronic pseudocyst abutting the gastric antrum appears similar in size measuring 4.0 x 3.2 cm, however now as surrounding soft tissue stranding. Multiple small peripancreatic nodes. No evidence of pancreatic necrosis. Punctate pancreatic calcification in the body. No new fluid collection. Spleen: Normal in size without focal abnormality. Adrenals/Urinary Tract: No adrenal nodule. No hydronephrosis or perinephric edema. Homogeneous renal enhancement. Urinary bladder is physiologically distended without wall thickening. Stomach/Bowel: Stomach physiologically distended. Mild stranding about the gastric antrum likely secondary to pancreatic inflammation. No bowel obstruction, wall thickening or inflammation. Normal appendix. Small volume colonic stool. Vascular/Lymphatic: Multiple small upper abdominal peripancreatic nodes have increased, likely reactive. Prominent left mesenteric nodes are again seen. The splenic and portal veins are patent. Normal caliber abdominal aorta. Reproductive: Prostate is unremarkable. Other: No significant ascites.  Abdominal fluid collection. No free air. Minimal fat in the inguinal canals. Musculoskeletal: There are no acute or suspicious osseous abnormalities. IMPRESSION: 1. Acute edematous pancreatitis. Chronic right upper quadrant pseudocyst unchanged in size, however also with surrounding inflammation. No new peripancreatic fluid collection or evidence of pancreatic necrosis. 2. Increased number of upper abdominal lymph nodes, likely reactive. Prominent left mesenteric nodes are unchanged. Electronically Signed   By: Rubye OaksMelanie  Ehinger M.D.   On: 07/30/2017 02:57    Time Spent in minutes  25   Leilene Diprima M.D on 08/02/2017 at 2:48 PM  Between 7am to 7pm - Pager - [phone]  After 7pm go to [email or web address] - password [email or web address]  [facility] -  Office  [phone]

## 2017-08-02 NOTE — Progress Notes (Signed)
Pt complaint of pain 10 out of 10. Pain med given via IV and pt still complained of no relief. Made MD aware and MD put in new orders for pain meds one time dose. Call light within reach. Bed in lowest position. Will continue to monitor.

## 2017-08-03 ENCOUNTER — Encounter (HOSPITAL_COMMUNITY): Payer: Self-pay | Admitting: Gastroenterology

## 2017-08-03 DIAGNOSIS — K863 Pseudocyst of pancreas: Secondary | ICD-10-CM

## 2017-08-03 DIAGNOSIS — K852 Alcohol induced acute pancreatitis without necrosis or infection: Secondary | ICD-10-CM

## 2017-08-03 LAB — CBC
HCT: 37.2 % — ABNORMAL LOW (ref 39.0–52.0)
Hemoglobin: 12 g/dL — ABNORMAL LOW (ref 13.0–17.0)
MCH: 28.8 pg (ref 26.0–34.0)
MCHC: 32.3 g/dL (ref 30.0–36.0)
MCV: 89.4 fL (ref 78.0–100.0)
Platelets: 290 10*3/uL (ref 150–400)
RBC: 4.16 MIL/uL — ABNORMAL LOW (ref 4.22–5.81)
RDW: 12.5 % (ref 11.5–15.5)
WBC: 10.4 10*3/uL (ref 4.0–10.5)

## 2017-08-03 LAB — COMPREHENSIVE METABOLIC PANEL
ALT: 20 U/L (ref 17–63)
AST: 19 U/L (ref 15–41)
Albumin: 3.4 g/dL — ABNORMAL LOW (ref 3.5–5.0)
Alkaline Phosphatase: 33 U/L — ABNORMAL LOW (ref 38–126)
Anion gap: 8 (ref 5–15)
BUN: 5 mg/dL — ABNORMAL LOW (ref 6–20)
CO2: 24 mmol/L (ref 22–32)
Calcium: 8.6 mg/dL — ABNORMAL LOW (ref 8.9–10.3)
Chloride: 107 mmol/L (ref 101–111)
Creatinine, Ser: 0.58 mg/dL — ABNORMAL LOW (ref 0.61–1.24)
GFR calc Af Amer: >60 mL/min (ref >60)
GFR calc non Af Amer: >60 mL/min (ref >60)
Glucose, Bld: 97 mg/dL (ref 65–99)
Potassium: 3.9 mmol/L (ref 3.5–5.1)
Sodium: 139 mmol/L (ref 135–145)
Total Bilirubin: 0.7 mg/dL (ref 0.3–1.2)
Total Protein: 6.9 g/dL (ref 6.5–8.1)

## 2017-08-03 LAB — LIPASE, BLOOD: Lipase: 306 U/L — ABNORMAL HIGH (ref 11–51)

## 2017-08-03 MED ORDER — HEPARIN SODIUM (PORCINE) 5000 UNIT/ML IJ SOLN
5000.0000 [IU] | Freq: Three times a day (TID) | INTRAMUSCULAR | Status: DC
  Administered 2017-08-03 – 2017-08-06 (×9): 5000 [IU] via SUBCUTANEOUS
  Filled 2017-08-03 (×9): qty 1

## 2017-08-03 MED ORDER — MORPHINE SULFATE (PF) 4 MG/ML IV SOLN
4.0000 mg | Freq: Once | INTRAVENOUS | Status: AC
  Administered 2017-08-03: 4 mg via INTRAVENOUS
  Filled 2017-08-03: qty 1

## 2017-08-03 MED ORDER — PROMETHAZINE HCL 25 MG/ML IJ SOLN
25.0000 mg | INTRAMUSCULAR | Status: DC | PRN
Start: 2017-08-03 — End: 2017-08-06
  Administered 2017-08-03 – 2017-08-04 (×4): 25 mg via INTRAVENOUS
  Filled 2017-08-03 (×4): qty 1

## 2017-08-03 MED ORDER — METOCLOPRAMIDE HCL 5 MG/ML IJ SOLN
5.0000 mg | Freq: Once | INTRAMUSCULAR | Status: AC
  Administered 2017-08-03: 5 mg via INTRAVENOUS
  Filled 2017-08-03: qty 2

## 2017-08-03 MED ORDER — MORPHINE SULFATE (PF) 4 MG/ML IV SOLN
4.0000 mg | INTRAVENOUS | Status: DC | PRN
  Administered 2017-08-03 – 2017-08-05 (×9): 4 mg via INTRAVENOUS
  Filled 2017-08-03 (×9): qty 1

## 2017-08-03 MED ORDER — KETOROLAC TROMETHAMINE 30 MG/ML IJ SOLN
30.0000 mg | Freq: Once | INTRAMUSCULAR | Status: AC
  Administered 2017-08-03: 30 mg via INTRAVENOUS
  Filled 2017-08-03: qty 1

## 2017-08-03 NOTE — Care Management Note (Signed)
Case Management Note  Patient Details  Name: Nathaniel [name] MRN: [id number] Date of Birth: 29-Jun-1991  Subjective/Objective:        Admitted with pancreatitis. Chart reviewed for CM needs. PT is from home, lives s/o. He is ind with ADL's. He has insurance and PCP. He has transportation. He has extensive history for his age, including CHF, A-fib, pancreatitis. He is employed.            Action/Plan: Anticipate DC home with self care. CM will cont to follow for DC planning needs as pt's pancreatitis/psuedocyst is not improving at this time.   Expected Discharge Date:  08/01/17               Expected Discharge Plan:  Home/Self Care  In-House Referral:  NA  Discharge planning Services     Post Acute Care Choice:  NA Choice offered to:  NA  Status of Service:  In process, will continue to follow  Malcolm Metro, RN 08/03/2017, 12:03 PM

## 2017-08-03 NOTE — Consult Note (Signed)
Referring Provider: Eddie North, MD Primary Care Physician:  Patient, No Pcp Per Primary Gastroenterologist:  Dr. Rob Bunting  Reason for Consultation:  Pancreatitis with increasing pseudocyst  HPI: Nathaniel [name] is a 26 y.o. male medical history significant for alcoholism in remission (patient last etoh 3 months ago), paroxysmal atrial fibrillation status post ablation on chronic Eliquis, chronic systolic CHF, recurrent pancreatitis with known pseudocyst who presented to emergency department with severe abdominal pain in the epigastrium associated with nausea.  Patient states his symptoms somewhat atypical from prior pancreatitis.  He described it more as a constant ache worse before he would have more intense pain/colicky in nature.  Current symptoms began about a week ago.  Initially was seen at [facility] but treated in the emergency department and released, lipase 1960 at the time.  Lipase this admission was 325, leukocytosis with white blood cell count of 11,000.  CT abdomen and pelvis revealed acute edematous pancreatitis with unchanged pseudocyst.    Due to failure to improve he had a repeat CT scan yesterday showing increasing peripancreatic edema. Thick-walled cystic structure anterior to the pancreatic head, abutting the gastric antrum and common bile duct has increased in size with maximum diameter of 8 cm, from prior measurement of 5.2 cm 4 days ago.  This demonstrated thick and more indistinct walls and increased adjacent mesenteric inflammatory changes.  Last episode of emesis last night.  Currently n.p.o.  Did have clear liquids yesterday for breakfast and lunch.  Heartburn well controlled on Zantac.  Denies constipation, diarrhea, melena, rectal bleeding.  LFTs have been normal.  States his worst pain was yesterday since admission.  Given Toradol and morphine at 2 AM and states this really helped with his pain.  Patient admitted back in May 2017 with SIRS without infection and  with organ dysfunction due to acute pancreatitis.  Noted to have A. fib with RVR at that time.  Followed followed by [facility] at the time.  Previously unassigned.  Readmitted in July 2017 with acute on chronic pancreatitis with pseudocyst formation.  November 2017 he had a MRCP showing no biliary obstruction, no choledocholithiasis, no cholelithiasis.  He had minimal hepatic steatosis.  Pseudocyst measuring 5.2 x 2.7 cm but on the coronal imaging measures 7.3 x 3.9 cm. Patient tells me his first episode of pancreatitis was around 2011.  He states he had a pseudocyst around that time as well.  It was followed over time it had shrunk pretty small.  Denies having EUS with drainage at any point.  States he did have an EGD at that time.  Has been seen over at [facility] as well in the remote past.  No family history of pancreatitis.  Patient states he stopped drinking about a year ago but did have a couple beers 3 months ago. Very slightly elevated IgG4 previously.  Lipids normal.    Prior to Admission medications   Medication Sig Start Date End Date Taking? Authorizing Provider  diltiazem (CARDIZEM) 30 MG tablet Take 1 tablet every 4 hours AS NEEDED for rapid afib heart rate >100 01/10/17  Yes Newman Nip, NP  ELIQUIS 5 MG TABS tablet TAKE 1 TABLET BY MOUTH TWICE DAILY 03/28/17  Yes Allred, Fayrene Fearing, MD  flecainide (TAMBOCOR) 100 MG tablet TAKE 1 TABLET(100 MG) BY MOUTH TWICE DAILY 06/27/17  Yes Newman Nip, NP  HYDROcodone-acetaminophen (NORCO/VICODIN) 5-325 MG tablet Take 2 tablets by mouth every 4 (four) hours as needed. 07/26/17  Yes Bethel Born, PA-C  lisinopril (PRINIVIL,ZESTRIL)  5 MG tablet TAKE 1 TABLET(5 MG) BY MOUTH DAILY 03/07/17  Yes Duke SalviaKlein, Steven C, MD  metoprolol succinate (TOPROL-XL) 25 MG 24 hr tablet TAKE 1 TABLET(25 MG) BY MOUTH DAILY 05/05/17  Yes Allred, Fayrene FearingJames, MD  ondansetron (ZOFRAN ODT) 4 MG disintegrating tablet Take 1 tablet (4 mg total) by mouth every 8 (eight) hours as needed for  nausea or vomiting. 07/26/17  Yes Bethel BornGekas, Kelly Marie, PA-C  ranitidine (ZANTAC) 150 MG capsule Take 150 mg by mouth 2 (two) times daily.   Yes [provider]  triamcinolone cream (KENALOG) 0.1 % Apply 1 application topically every evening.   Yes [provider]    Current Facility-Administered Medications  Medication Dose Route Frequency Provider Last Rate Last Dose  . 0.9 %  sodium chloride infusion   Intravenous Continuous Dhungel, Nishant, MD 100 mL/hr at 08/03/17 0534    . acetaminophen (TYLENOL) tablet 650 mg  650 mg Oral Q6H PRN Opyd, Lavone Neriimothy S, MD       Or  . acetaminophen (TYLENOL) suppository 650 mg  650 mg Rectal Q6H PRN Opyd, Lavone Neriimothy S, MD      . famotidine (PEPCID) IVPB 20 mg premix  20 mg Intravenous Q12H Opyd, Lavone Neriimothy S, MD   Stopped at 08/02/17 2225  . flecainide (TAMBOCOR) tablet 100 mg  100 mg Oral Q12H Opyd, Lavone Neriimothy S, MD   100 mg at 08/02/17 2155  . lipase/protease/amylase (CREON) capsule 24,000 Units  24,000 Units Oral TID AC Dhungel, Nishant, MD   24,000 Units at 08/02/17 1625  . lisinopril (PRINIVIL,ZESTRIL) tablet 5 mg  5 mg Oral Daily Opyd, Lavone Neriimothy S, MD   5 mg at 08/02/17 [id number]  . metoCLOPramide (REGLAN) injection 5 mg  5 mg Intravenous Q6H PRN Dhungel, Nishant, MD   5 mg at 08/02/17 2155  . morphine 4 MG/ML injection 4 mg  4 mg Intravenous Q3H PRN Bobette Mortiz, David Manuel, MD   4 mg at 08/03/17 0204  . promethazine (PHENERGAN) injection 25 mg  25 mg Intravenous Q4H PRN Bobette Mortiz, David Manuel, MD   25 mg at 08/03/17 0241    Allergies as of 07/29/2017  . (No Known Allergies)    Past Medical History:  Diagnosis Date  . Atrial flutter (HCC) 01/17/2016   Admx 5/17 with AFlutter with RVR in setting of pancreatitis // CHADS2-VASc=1 (CHF) // Anticoag not started // Amiodarone >> NSR   . Chronic lower back pain   . Chronic systolic CHF (congestive heart failure) (HCC)    Echo 01/17/16 - EF 35-40%, diffuse HK, mild LAE   . 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HCC)  02/03/2016   a. EF 35-40% in setting of AFlutter with RVR in 5/17 // probably tachycardia mediated // needs FU echo 2-3 mos after NSR restored // b. Limited Echo 7/17: EF 45-50%, diff HK, mild LAE  . Depression    "last 2-3 months; never treated for it" (12/09/2016)  . Fatty liver 12/2015   per CT  . Headache    "monthly" (12/09/2016)  . Obesity    BMI 45 12/2015  . Pancreatitis 2012   recurrent acute. [facility], [facility] ct then shows pseudocyst.   . Pancreatitis 2012 X 2; 2017 X 2  . Paroxysmal atrial fibrillation (HCC)   . Typical atrial flutter (HCC) 06/06/2017    Past Surgical History:  Procedure Laterality Date  . ATRIAL FIBRILLATION ABLATION N/A 12/09/2016   Procedure: Atrial Fibrillation Ablation;  Surgeon: Hillis RangeJames Allred, MD;  [facility];  Service: Cardiovascular;  Laterality: N/A;  . FRACTURE SURGERY    . HAND SURGERY Right ~ 2008   "had pins in it; pulled them"  . TEE WITHOUT CARDIOVERSION N/A 12/08/2016   Procedure: TRANSESOPHAGEAL ECHOCARDIOGRAM (TEE);  Surgeon: Jake Bathe, MD;  Location: [facility] ENDOSCOPY;  Service: Cardiovascular;  Laterality: N/A;  . TONSILLECTOMY AND ADENOIDECTOMY     "in my teens"    Family History  Problem Relation Age of Onset  . Diabetes Mother     Social History   Socioeconomic History  . Marital status: Married    Spouse name: Not on file  . Number of children: 0  . Years of education: 22  . Highest education level: Not on file  Social Needs  . Financial resource strain: Not on file  . Food insecurity - worry: Not on file  . Food insecurity - inability: Not on file  . Transportation needs - medical: Not on file  . Transportation needs - non-medical: Not on file  Occupational History    Employer: [employer]  Tobacco Use  . Smoking status: Former Smoker    Packs/day: 0.50    Types: Cigarettes  . Smokeless tobacco: Current User    Types: Snuff  . Tobacco comment: 12/09/2016 "quit  chewing May 2017"  Substance and Sexual Activity  . Alcohol use: Yes    Comment: occasional  . Drug use: Yes    Types: Marijuana    Comment: 12/09/2016 "daily from age 48 to 69"  . Sexual activity: Yes    Birth control/protection: None  Other Topics Concern  . Not on file  Social History Narrative   Pt lives in [place] with fiancee.  Works at [employer] in [place].     ROS:  General: Negative for anorexia, weight loss, fever, chills, fatigue, weakness. Eyes: Negative for vision changes.  ENT: Negative for hoarseness, difficulty swallowing , nasal congestion. CV: Negative for chest pain, angina, palpitations, dyspnea on exertion, peripheral edema.  Respiratory: Negative for dyspnea at rest, dyspnea on exertion, cough, sputum, wheezing.  GI: See history of present illness. GU:  Negative for dysuria, hematuria, urinary incontinence, urinary frequency, nocturnal urination.  MS: Negative for joint pain, low back pain.  Derm: Negative for rash or itching.  Neuro: Negative for weakness, abnormal sensation, seizure, frequent headaches, memory loss, confusion.  Psych: Negative for anxiety, depression, suicidal ideation, hallucinations.  Endo: Negative for unusual weight change.  Heme: Negative for bruising or bleeding. Allergy: Negative for rash or hives.       Physical Examination: Vital signs in last 24 hours: Temp:  [97.5 F (36.4 C)-98.3 F (36.8 C)] 97.7 F (36.5 C) (12/05 0552) Pulse Rate:  [65-73] 67 (12/05 0552) Resp:  [18-22] 20 (12/05 0552) BP: (118-134)/(60-66) 134/66 (12/05 0552) SpO2:  [95 %-99 %] 95 % (12/05 0552) Weight:  [323 lb 8 oz (146.7 kg)] 323 lb 8 oz (146.7 kg) (12/05 0552) Last BM Date: 08/01/17  General: Well-nourished, well-developed in no acute distress.  Head: Normocephalic, atraumatic.   Eyes: Conjunctiva pink, no icterus. Mouth: Oropharyngeal mucosa moist and pink , no lesions erythema or exudate. Neck: Supple without thyromegaly, masses, or  lymphadenopathy.  Lungs: Clear to auscultation bilaterally.  Heart: Regular rate and rhythm, no murmurs rubs or gallops.  Abdomen: Bowel sounds are normal, moderate epigastric tenderness, nondistended, no hepatosplenomegaly or masses, no abdominal bruits or    hernia , no rebound or guarding.   Rectal: Not performed Extremities: No lower extremity edema, clubbing,  deformity.  Neuro: Alert and oriented x 4 , grossly normal neurologically.  Skin: Warm and dry, no rash or jaundice.   Psych: Alert and cooperative, normal mood and affect.        Intake/Output from previous day: 12/04 0701 - 12/05 0700 In: 3096.3 [P.O.:240; I.V.:2756.3; IV Piggyback:100] Out: 200 [Urine:200] Intake/Output this shift: No intake/output data recorded.  Lab Results: CBC Recent Labs    07/31/17 1408 08/01/17 0407 08/03/17 0445  WBC 7.8 5.9 10.4  HGB 12.9* 11.7* 12.0*  HCT 40.0 36.5* 37.2*  MCV 89.7 89.7 89.4  PLT 244 246 290   BMET Recent Labs    08/01/17 0407 08/02/17 0842 08/03/17 0445  NA 136 138 139  K 3.6 3.7 3.9  CL 107 110 107  CO2 21* 22 24  GLUCOSE 70 92 97  BUN 9 7 5*  CREATININE 0.52* 0.57* 0.58*  CALCIUM 8.1* 8.3* 8.6*   LFT Recent Labs    08/01/17 0407 08/02/17 0842 08/03/17 0445  BILITOT 1.1 1.1 0.7  ALKPHOS 34* 33* 33*  AST 17 17 19   ALT 22 20 20   PROT 6.6 6.7 6.9  ALBUMIN 3.2* 3.3* 3.4*    Lipase Recent Labs    08/02/17 0842 08/03/17 0445  LIPASE 734* 306*    PT/INR No results for input(s): LABPROT, INR in the last 72 hours.    Imaging Studies: Ct Abdomen Pelvis W Contrast  Result Date: 08/02/2017 CLINICAL DATA:  Worsening abdominal pain. EXAM: CT ABDOMEN AND PELVIS WITH CONTRAST TECHNIQUE: Multidetector CT imaging of the abdomen and pelvis was performed using the standard protocol following bolus administration of intravenous contrast. CONTRAST:  100 cc Isovue 300 intravenously. COMPARISON:  07/30/2017 FINDINGS: Lower chest: No acute abnormality.  Hepatobiliary: No focal liver abnormality is seen. No gallstones, gallbladder wall thickening, or biliary dilatation. Pancreas: Increasing peripancreatic edema. Thick-walled cystic structure anterior to the pancreatic head, abutting the gastric antrum and common bile duct has increased in size with maximum measurement of 8 cm, from prior measurement of 5.2 cm and demonstrates thick and more indistinct walls and increased adjacent mesenteric inflammatory changes. No significant pancreatic ductal dilation. Spleen: Normal in size without focal abnormality. Adrenals/Urinary Tract: Adrenal glands are unremarkable. Kidneys are normal, without renal calculi, focal lesion, or hydronephrosis. Bladder is unremarkable. Stomach/Bowel: Stomach is within normal limits. Appendix appears normal. No evidence of bowel wall thickening, distention, or inflammatory changes. Vascular/Lymphatic: Persistent upper abdominal retroperitoneal and mesenteric lymph nodes, measuring up to 1.2 cm in short axis. Reproductive: Prostate is unremarkable. Other: No abdominal wall hernia or abnormality. No abdominopelvic ascites. Musculoskeletal: No acute or significant osseous findings. IMPRESSION: Increased peripancreatic edema with significant increase in the size of known pseudocyst ventral to the pancreatic head. This pseudocyst now exhibits indistinct thickened walls and abuts the extrahepatic common bile duct, which could not be separated from it. No upstream biliary dilation is seen. Extensive likely reactive upper abdominal mesenteric and retroperitoneal lymphadenopathy. Electronically Signed   By: Ted Mcalpine M.D.   On: 08/02/2017 22:59   Ct Abdomen Pelvis W Contrast  Result Date: 07/30/2017 CLINICAL DATA:  Right-sided abdominal pain. EXAM: CT ABDOMEN AND PELVIS WITH CONTRAST TECHNIQUE: Multidetector CT imaging of the abdomen and pelvis was performed using the standard protocol following bolus administration of intravenous  contrast. CONTRAST:  ISOVUE-300 IOPAMIDOL (ISOVUE-300) INJECTION 61% COMPARISON:  CT 06/04/2017 FINDINGS: Lower chest: Left basilar atelectasis or scarring. No pleural fluid. Hepatobiliary: Prominent liver spanning 20 cm cranial caudal. No focal lesion. Gallbladder physiologically  distended, no calcified stone. No biliary dilatation. Pancreas: Peripancreatic stranding diffusely about the pancreas. Thick-walled chronic pseudocyst abutting the gastric antrum appears similar in size measuring 4.0 x 3.2 cm, however now as surrounding soft tissue stranding. Multiple small peripancreatic nodes. No evidence of pancreatic necrosis. Punctate pancreatic calcification in the body. No new fluid collection. Spleen: Normal in size without focal abnormality. Adrenals/Urinary Tract: No adrenal nodule. No hydronephrosis or perinephric edema. Homogeneous renal enhancement. Urinary bladder is physiologically distended without wall thickening. Stomach/Bowel: Stomach physiologically distended. Mild stranding about the gastric antrum likely secondary to pancreatic inflammation. No bowel obstruction, wall thickening or inflammation. Normal appendix. Small volume colonic stool. Vascular/Lymphatic: Multiple small upper abdominal peripancreatic nodes have increased, likely reactive. Prominent left mesenteric nodes are again seen. The splenic and portal veins are patent. Normal caliber abdominal aorta. Reproductive: Prostate is unremarkable. Other: No significant ascites. Abdominal fluid collection. No free air. Minimal fat in the inguinal canals. Musculoskeletal: There are no acute or suspicious osseous abnormalities. IMPRESSION: 1. Acute edematous pancreatitis. Chronic right upper quadrant pseudocyst unchanged in size, however also with surrounding inflammation. No new peripancreatic fluid collection or evidence of pancreatic necrosis. 2. Increased number of upper abdominal lymph nodes, likely reactive. Prominent left mesenteric nodes  are unchanged. Electronically Signed   By: Rubye Oaks M.D.   On: 07/30/2017 02:57  [4 week]   Impression: 26 year old gentleman admitted with acute pancreatitis, known history of recurrent pancreatitis with pseudocyst as outlined above.  Patient reports pain somewhat different this time.  More in the epigastrium and just right of midline.  Reimaging this admission due to persistent/worsening abdominal pain revealed increasing peripancreatic edema with significant increase in size of known pseudocyst ventral to the pancreatic head.  Pseudocyst now exhibiting indistinct thickened walls and abutting the extrahepatic common bile duct, which cannot be separated from it.  No upstream biliary dilation.  LFTs remain normal.  Plan: 1. Continue NPO status for now. May require nasojejunal tube feeding. To discuss with Dr. Jena Gauss.  2. EUS with pseudocyst drainage will likely be required and could be done by primary gastroenterologist.  3. Monitor closed for biliary obstruction given proximity of the pseudocyst to the CBD. 4. Continue supportive measures.   We would like to thank you for the opportunity to participate in the care of Nathaniel [name].  Leanna Battles. Dixon Boos [facility] [phone] 12/5/20184:16 PM     LOS: 4 days

## 2017-08-03 NOTE — Progress Notes (Signed)
PROGRESS NOTE                                                                                                                                                                                                             Patient Demographics:    Nathaniel [name], is a 26 y.o. male, DOB - 10-03-1990, UJW:[id number]  Admit date - 07/29/2017   Admitting Physician Briscoe Deutscher, MD  Outpatient Primary MD for the patient is Patient, No Pcp Per  LOS - 4  Outpatient Specialists:None  Chief Complaint  Patient presents with  . Abdominal Pain       Brief Narrative   26 year old male with History of Recurrent pancreatitis With pseudocyst(alcohol induced), Alcohol abuse in remission, paroxysmal A. Fib status post ablation , on anticoagulation, Chronic systolic CHF Presented with severe epigastric pain with nausea for past few days. He was seen in another hospital ED for similar symptoms 3 days back, given pain medication and antiemetics and was discharged home . I will reverse symptoms did not improve and patient presented to the ED. In the ED His vitals were stable. He had elevated lipase of 325, WBC of 11 K. CT of the abdomen and pelvis showed acute edematous pancreatitis with unchanged pseudocyst. Patient admitted for acute recurrent pancreatitis.   Subjective:   Had increasing epigastric pain yesterday, Persistent after receiving 4 mg IV Dilaudid. Mid nothing by mouth and CT of the abdomen repeated showing increased peripancreatic edema and size of the pseudocyst.   Assessment  & Plan :    Principal Problem:   Acute pancreatitis without infection or necrosis Lipase worsened. Patient made nothing by mouth. Dilaudid assist to IV morphine with better pain control. Continue IV fluids, Tylenol, antiemetics. Serial abdominal exam. Added Creon. GI consulted for Worsening symptoms and worsened findings on CT abdomen.   Active  Problems:   Alcohol abuse, in remission Encouraged on cessation.    Chronic systolic CHF (congestive heart failure) (HCC) Euvolemic. Continue Lisinopril.     Paroxysmal atrial fibrillation (HCC) Rate controlled. Continue flecainide . Held eliquis given increase in size of pseudocyst that may need intervention.   Hiccups Improved with Reglan.    Code Status : Full code  Family Communication  : None At bedside  Disposition Plan  : Home once improved  Barriers For Discharge : Active symptoms  Consults  :  GI  Procedures  : CT abdomen pelvis  DVT Prophylaxis  : sq heparin  Lab Results  Component Value Date   PLT 290 08/03/2017    Antibiotics  :    Anti-infectives (From admission, onward)   None        Objective:   Vitals:   08/02/17 1806 08/02/17 2001 08/02/17 2141 08/03/17 0552  BP: 118/60  126/66 134/66  Pulse: 65  72 67  Resp: (!) 22  20 20   Temp: 98.3 F (36.8 C)  98 F (36.7 C) 97.7 F (36.5 C)  TempSrc: Oral  Oral Oral  SpO2: 95% 97% 99% 95%  Weight:    (!) 146.7 kg (323 lb 8 oz)  Height:        Wt Readings from Last 3 Encounters:  08/03/17 (!) 146.7 kg (323 lb 8 oz)  07/26/17 (!) 145.2 kg (320 lb)  07/08/17 (!) 149.1 kg (328 lb 9.6 oz)     Intake/Output Summary (Last 24 hours) at 08/03/2017 1438 Last data filed at 08/03/2017 16100829 Gross per 24 hour  Intake 2856.25 ml  Output 200 ml  Net 2656.25 ml     Physical Exam General: Young obese male not in distress HEENT: Moist because of, supple neck Chest: Clear bilaterally neck CVS: Normal S1 and S2, no murmurs GI: Soft, mildly BS tenderness, bowel sounds present,  nondistended Musculoskeletal: Warm, no edema   Data Review:    CBC Recent Labs  Lab 07/30/17 0037 07/30/17 0802 07/31/17 1408 08/01/17 0407 08/03/17 0445  WBC 11.0* 9.1 7.8 5.9 10.4  HGB 14.0 12.9* 12.9* 11.7* 12.0*  HCT 43.1 39.8 40.0 36.5* 37.2*  PLT 243 221 244 246 290  MCV 89.8 90.2 89.7 89.7 89.4  MCH 29.2  29.3 28.9 28.7 28.8  MCHC 32.5 32.4 32.3 32.1 32.3  RDW 12.6 12.8 12.6 12.4 12.5  LYMPHSABS 1.8 1.5  --  1.8  --   MONOABS 0.9 0.9  --  0.6  --   EOSABS 0.1 0.2  --  0.2  --   BASOSABS 0.0 0.0  --  0.0  --     Chemistries  Recent Labs  Lab 07/30/17 0037 07/30/17 0802 08/01/17 0407 08/02/17 0842 08/03/17 0445  NA 137 138 136 138 139  K 3.8 4.3 3.6 3.7 3.9  CL 105 106 107 110 107  CO2 26 25 21* 22 24  GLUCOSE 98 87 70 92 97  BUN 12 10 9 7  5*  CREATININE 0.72 0.59* 0.52* 0.57* 0.58*  CALCIUM 9.0 8.4* 8.1* 8.3* 8.6*  AST 33 28 17 17 19   ALT 40 36 22 20 20   ALKPHOS 45 39 34* 33* 33*  BILITOT 1.1 1.2 1.1 1.1 0.7   ------------------------------------------------------------------------------------------------------------------ No results for input(s): CHOL, HDL, LDLCALC, TRIG, CHOLHDL, LDLDIRECT in the last 72 hours.  No results found for: HGBA1C ------------------------------------------------------------------------------------------------------------------ No results for input(s): TSH, T4TOTAL, T3FREE, THYROIDAB in the last 72 hours.  Invalid input(s): FREET3 ------------------------------------------------------------------------------------------------------------------ No results for input(s): VITAMINB12, FOLATE, FERRITIN, TIBC, IRON, RETICCTPCT in the last 72 hours.  Coagulation profile No results for input(s): INR, PROTIME in the last 168 hours.  No results for input(s): DDIMER in the last 72 hours.  Cardiac Enzymes No results for input(s): CKMB, TROPONINI, MYOGLOBIN in the last 168 hours.  Invalid input(s): CK ------------------------------------------------------------------------------------------------------------------    Component Value Date/Time   BNP 146.9 (H) 01/19/2016 1306    Inpatient Medications  Scheduled Meds: . flecainide  100 mg Oral  Q12H  . lipase/protease/amylase  24,000 Units Oral TID AC  . lisinopril  5 mg Oral Daily   Continuous  Infusions: . sodium chloride 100 mL/hr at 08/03/17 0534  . famotidine (PEPCID) IV 20 mg (08/03/17 1029)   PRN Meds:.acetaminophen **OR** acetaminophen, metoCLOPramide (REGLAN) injection, morphine injection, promethazine  Micro Results No results found for this or any previous visit (from the past 240 hour(s)).  Radiology Reports Ct Abdomen Pelvis W Contrast  Result Date: 08/02/2017 CLINICAL DATA:  Worsening abdominal pain. EXAM: CT ABDOMEN AND PELVIS WITH CONTRAST TECHNIQUE: Multidetector CT imaging of the abdomen and pelvis was performed using the standard protocol following bolus administration of intravenous contrast. CONTRAST:  100 cc Isovue 300 intravenously. COMPARISON:  07/30/2017 FINDINGS: Lower chest: No acute abnormality. Hepatobiliary: No focal liver abnormality is seen. No gallstones, gallbladder wall thickening, or biliary dilatation. Pancreas: Increasing peripancreatic edema. Thick-walled cystic structure anterior to the pancreatic head, abutting the gastric antrum and common bile duct has increased in size with maximum measurement of 8 cm, from prior measurement of 5.2 cm and demonstrates thick and more indistinct walls and increased adjacent mesenteric inflammatory changes. No significant pancreatic ductal dilation. Spleen: Normal in size without focal abnormality. Adrenals/Urinary Tract: Adrenal glands are unremarkable. Kidneys are normal, without renal calculi, focal lesion, or hydronephrosis. Bladder is unremarkable. Stomach/Bowel: Stomach is within normal limits. Appendix appears normal. No evidence of bowel wall thickening, distention, or inflammatory changes. Vascular/Lymphatic: Persistent upper abdominal retroperitoneal and mesenteric lymph nodes, measuring up to 1.2 cm in short axis. Reproductive: Prostate is unremarkable. Other: No abdominal wall hernia or abnormality. No abdominopelvic ascites. Musculoskeletal: No acute or significant osseous findings. IMPRESSION: Increased  peripancreatic edema with significant increase in the size of known pseudocyst ventral to the pancreatic head. This pseudocyst now exhibits indistinct thickened walls and abuts the extrahepatic common bile duct, which could not be separated from it. No upstream biliary dilation is seen. Extensive likely reactive upper abdominal mesenteric and retroperitoneal lymphadenopathy. Electronically Signed   By: Ted Mcalpine M.D.   On: 08/02/2017 22:59   Ct Abdomen Pelvis W Contrast  Result Date: 07/30/2017 CLINICAL DATA:  Right-sided abdominal pain. EXAM: CT ABDOMEN AND PELVIS WITH CONTRAST TECHNIQUE: Multidetector CT imaging of the abdomen and pelvis was performed using the standard protocol following bolus administration of intravenous contrast. CONTRAST:  ISOVUE-300 IOPAMIDOL (ISOVUE-300) INJECTION 61% COMPARISON:  CT 06/04/2017 FINDINGS: Lower chest: Left basilar atelectasis or scarring. No pleural fluid. Hepatobiliary: Prominent liver spanning 20 cm cranial caudal. No focal lesion. Gallbladder physiologically distended, no calcified stone. No biliary dilatation. Pancreas: Peripancreatic stranding diffusely about the pancreas. Thick-walled chronic pseudocyst abutting the gastric antrum appears similar in size measuring 4.0 x 3.2 cm, however now as surrounding soft tissue stranding. Multiple small peripancreatic nodes. No evidence of pancreatic necrosis. Punctate pancreatic calcification in the body. No new fluid collection. Spleen: Normal in size without focal abnormality. Adrenals/Urinary Tract: No adrenal nodule. No hydronephrosis or perinephric edema. Homogeneous renal enhancement. Urinary bladder is physiologically distended without wall thickening. Stomach/Bowel: Stomach physiologically distended. Mild stranding about the gastric antrum likely secondary to pancreatic inflammation. No bowel obstruction, wall thickening or inflammation. Normal appendix. Small volume colonic stool. Vascular/Lymphatic:  Multiple small upper abdominal peripancreatic nodes have increased, likely reactive. Prominent left mesenteric nodes are again seen. The splenic and portal veins are patent. Normal caliber abdominal aorta. Reproductive: Prostate is unremarkable. Other: No significant ascites. Abdominal fluid collection. No free air. Minimal fat in the inguinal canals. Musculoskeletal: There are no acute  or suspicious osseous abnormalities. IMPRESSION: 1. Acute edematous pancreatitis. Chronic right upper quadrant pseudocyst unchanged in size, however also with surrounding inflammation. No new peripancreatic fluid collection or evidence of pancreatic necrosis. 2. Increased number of upper abdominal lymph nodes, likely reactive. Prominent left mesenteric nodes are unchanged. Electronically Signed   By: Rubye Oaks M.D.   On: 07/30/2017 02:57    Time Spent in minutes  25   Fue Cervenka M.D on 08/03/2017 at 2:38 PM  Between 7am to 7pm - Pager - [phone]  After 7pm go to [email or web address] - password [facility]  [facility] -  Office  [phone]

## 2017-08-03 NOTE — Progress Notes (Signed)
Pt received Dilaudid but didn't get much relief from it and it made him sick. He threw up right after receiving it. Paged Md for a different pain med and he also ordered a dose of reglan to be given too.   Cristie Hem, RN 1:54 AM 08/03/17

## 2017-08-03 NOTE — Plan of Care (Signed)
Pt is progressing 

## 2017-08-03 NOTE — Progress Notes (Signed)
Pt was having pain 10/10 despite the prn medication given to the pt. Paged Dr Gonzella Lex to inform him and to inquire if the pt could have additional pain medication. He prescribed a one time dose of 4 mg of Morphine by IV.

## 2017-08-04 DIAGNOSIS — K8500 Idiopathic acute pancreatitis without necrosis or infection: Secondary | ICD-10-CM

## 2017-08-04 MED ORDER — ORAL CARE MOUTH RINSE
15.0000 mL | Freq: Two times a day (BID) | OROMUCOSAL | Status: DC
  Administered 2017-08-04 – 2017-08-05 (×3): 15 mL via OROMUCOSAL

## 2017-08-04 MED ORDER — CHLORHEXIDINE GLUCONATE 0.12 % MT SOLN
15.0000 mL | Freq: Two times a day (BID) | OROMUCOSAL | Status: DC
  Administered 2017-08-04 – 2017-08-06 (×4): 15 mL via OROMUCOSAL
  Filled 2017-08-04 (×4): qty 15

## 2017-08-04 NOTE — Progress Notes (Signed)
Subjective:  Had pretty bad pain late yesterday evening. Feels ok right now. Scared to move as it may exacerbate his pain. Pain in epigastric area and radiation into back. Nausea without vomiting. NPO.   Objective: Vital signs in last 24 hours: Temp:  [98.1 F (36.7 C)] 98.1 F (36.7 C) (12/06 0614) Pulse Rate:  [69-71] 69 (12/06 0614) Resp:  [19-20] 19 (12/06 0614) BP: (107-117)/(50-51) 107/50 (12/06 0614) SpO2:  [96 %] 96 % (12/06 0614) Weight:  [323 lb 3.1 oz (146.6 kg)] 323 lb 3.1 oz (146.6 kg) (12/06 16100614) Last BM Date: 08/02/17 General:   Alert,  Well-developed, well-nourished, pleasant and cooperative in NAD Head:  Normocephalic and atraumatic. Eyes:  Sclera clear, no icterus.  Abdomen:  Soft, moderate epig tendnerness. Normal bowel sounds, without guarding, and without rebound.   Extremities:  Without clubbing, deformity or edema. Neurologic:  Alert and  oriented x4;  grossly normal neurologically. Skin:  Intact without significant lesions or rashes. Psych:  Alert and cooperative. Normal mood and affect.  Intake/Output from previous day: 12/05 0701 - 12/06 0700 In: 461.7 [P.O.:30; I.V.:381.7; IV Piggyback:50] Out: 900 [Urine:900] Intake/Output this shift: No intake/output data recorded.  Lab Results: CBC Recent Labs    08/03/17 0445  WBC 10.4  HGB 12.0*  HCT 37.2*  MCV 89.4  PLT 290   BMET Recent Labs    08/02/17 0842 08/03/17 0445  NA 138 139  K 3.7 3.9  CL 110 107  CO2 22 24  GLUCOSE 92 97  BUN 7 5*  CREATININE 0.57* 0.58*  CALCIUM 8.3* 8.6*   LFTs Recent Labs    08/02/17 0842 08/03/17 0445  BILITOT 1.1 0.7  ALKPHOS 33* 33*  AST 17 19  ALT 20 20  PROT 6.7 6.9  ALBUMIN 3.3* 3.4*   Recent Labs    08/02/17 0842 08/03/17 0445  LIPASE 734* 306*   PT/INR No results for input(s): LABPROT, INR in the last 72 hours.    Imaging Studies: Ct Abdomen Pelvis W Contrast  Result Date: 08/02/2017 CLINICAL DATA:  Worsening abdominal pain. EXAM:  CT ABDOMEN AND PELVIS WITH CONTRAST TECHNIQUE: Multidetector CT imaging of the abdomen and pelvis was performed using the standard protocol following bolus administration of intravenous contrast. CONTRAST:  100 cc Isovue 300 intravenously. COMPARISON:  07/30/2017 FINDINGS: Lower chest: No acute abnormality. Hepatobiliary: No focal liver abnormality is seen. No gallstones, gallbladder wall thickening, or biliary dilatation. Pancreas: Increasing peripancreatic edema. Thick-walled cystic structure anterior to the pancreatic head, abutting the gastric antrum and common bile duct has increased in size with maximum measurement of 8 cm, from prior measurement of 5.2 cm and demonstrates thick and more indistinct walls and increased adjacent mesenteric inflammatory changes. No significant pancreatic ductal dilation. Spleen: Normal in size without focal abnormality. Adrenals/Urinary Tract: Adrenal glands are unremarkable. Kidneys are normal, without renal calculi, focal lesion, or hydronephrosis. Bladder is unremarkable. Stomach/Bowel: Stomach is within normal limits. Appendix appears normal. No evidence of bowel wall thickening, distention, or inflammatory changes. Vascular/Lymphatic: Persistent upper abdominal retroperitoneal and mesenteric lymph nodes, measuring up to 1.2 cm in short axis. Reproductive: Prostate is unremarkable. Other: No abdominal wall hernia or abnormality. No abdominopelvic ascites. Musculoskeletal: No acute or significant osseous findings. IMPRESSION: Increased peripancreatic edema with significant increase in the size of known pseudocyst ventral to the pancreatic head. This pseudocyst now exhibits indistinct thickened walls and abuts the extrahepatic common bile duct, which could not be separated from it. No upstream biliary dilation is seen. Extensive  likely reactive upper abdominal mesenteric and retroperitoneal lymphadenopathy. Electronically Signed   By: Ted Mcalpine M.D.   On: 08/02/2017  22:59   Ct Abdomen Pelvis W Contrast  Result Date: 07/30/2017 CLINICAL DATA:  Right-sided abdominal pain. EXAM: CT ABDOMEN AND PELVIS WITH CONTRAST TECHNIQUE: Multidetector CT imaging of the abdomen and pelvis was performed using the standard protocol following bolus administration of intravenous contrast. CONTRAST:  ISOVUE-300 IOPAMIDOL (ISOVUE-300) INJECTION 61% COMPARISON:  CT 06/04/2017 FINDINGS: Lower chest: Left basilar atelectasis or scarring. No pleural fluid. Hepatobiliary: Prominent liver spanning 20 cm cranial caudal. No focal lesion. Gallbladder physiologically distended, no calcified stone. No biliary dilatation. Pancreas: Peripancreatic stranding diffusely about the pancreas. Thick-walled chronic pseudocyst abutting the gastric antrum appears similar in size measuring 4.0 x 3.2 cm, however now as surrounding soft tissue stranding. Multiple small peripancreatic nodes. No evidence of pancreatic necrosis. Punctate pancreatic calcification in the body. No new fluid collection. Spleen: Normal in size without focal abnormality. Adrenals/Urinary Tract: No adrenal nodule. No hydronephrosis or perinephric edema. Homogeneous renal enhancement. Urinary bladder is physiologically distended without wall thickening. Stomach/Bowel: Stomach physiologically distended. Mild stranding about the gastric antrum likely secondary to pancreatic inflammation. No bowel obstruction, wall thickening or inflammation. Normal appendix. Small volume colonic stool. Vascular/Lymphatic: Multiple small upper abdominal peripancreatic nodes have increased, likely reactive. Prominent left mesenteric nodes are again seen. The splenic and portal veins are patent. Normal caliber abdominal aorta. Reproductive: Prostate is unremarkable. Other: No significant ascites. Abdominal fluid collection. No free air. Minimal fat in the inguinal canals. Musculoskeletal: There are no acute or suspicious osseous abnormalities. IMPRESSION: 1. Acute  edematous pancreatitis. Chronic right upper quadrant pseudocyst unchanged in size, however also with surrounding inflammation. No new peripancreatic fluid collection or evidence of pancreatic necrosis. 2. Increased number of upper abdominal lymph nodes, likely reactive. Prominent left mesenteric nodes are unchanged. Electronically Signed   By: Rubye Oaks M.D.   On: 07/30/2017 02:57  [2 weeks]   Assessment:  26 year old gentleman admitted with acute pancreatitis, known history of recurrent pancreatitis with pseudocyst as outlined above.  Patient reports pain somewhat different this time.  More in the epigastrium and just right of midline.  Reimaging this admission due to persistent/worsening abdominal pain revealed increasing peripancreatic edema with significant increase in size of known pseudocyst ventral to the pancreatic head.  Pseudocyst now exhibiting indistinct thickened walls and abutting the extrahepatic common bile duct, which cannot be separated from it.  No upstream biliary dilation.  LFTs remain normal.  Patient with intermittent severe abdominal pain. Episode last night. Improved this morning.   Plan: 1. F/U IgG 4 level.  2. Discussed with patient and Dr. Jena Gauss, trial of clears today.   Leanna Battles. [facility] [phone] 12/6/20189:22 AM     LOS: 5 days

## 2017-08-04 NOTE — Progress Notes (Signed)
PROGRESS NOTE                                                                                                                                                                                                             Patient Demographics:    Nathaniel [name], is a 26 y.o. male, DOB - 01/15/1991, ZOX:[id number]  Admit date - 07/29/2017   Admitting Physician Briscoe Deutscher, MD  Outpatient Primary MD for the patient is Patient, No Pcp Per  LOS - 5  Outpatient Specialists:None  Chief Complaint  Patient presents with  . Abdominal Pain       Brief Narrative   27 year old male with History of Recurrent pancreatitis With pseudocyst(alcohol induced), Alcohol abuse in remission, paroxysmal A. Fib status post ablation , on anticoagulation, Chronic systolic CHF Presented with severe epigastric pain with nausea for past few days. He was seen in another hospital ED for similar symptoms 3 days back, given pain medication and antiemetics and was discharged home . I will reverse symptoms did not improve and patient presented to the ED. In the ED His vitals were stable. He had elevated lipase of 325, WBC of 11 K. CT of the abdomen and pelvis showed acute edematous pancreatitis with unchanged pseudocyst. Patient admitted for acute recurrent pancreatitis.   Subjective:   Abdominal pain better since yesterday. Also denies any back pain today.   Assessment  & Plan :    Principal Problem:   Acute pancreatitis without infection or necrosis Increasing abdominal pain with worsened lipase on 12/4. Repeat abdominal CT showing increasing peripancreatic edema with increase in size of pseudocyst. Patient made nothing by mouth and managed with fluids and pain medications.   GI consult appreciated. Recommend Conservative management for now. Given some clinical improvement started on clear liquid.  -GI will arrange outpatient referral to [facility] for  EUS with pseudocyst drain After patient is discharged from the hospital.   Active Problems:   Alcohol abuse, in remission Encouraged on cessation. GI suspect patient is dumping his alcohol consumption. 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    Chronic systolic CHF (congestive heart failure) (HCC) Euvolemic. Continue Lisinopril.     Paroxysmal atrial fibrillation (HCC) Rate controlled. Continue flecainide . Resume eliquis.  Hiccups Improved with Reglan.    Code Status : Full  code  Family Communication  : None At bedside  Disposition Plan  : Home once improved  Barriers For Discharge : Active symptoms  Consults  :  GI  Procedures  : CT abdomen pelvis x2  DVT Prophylaxis  : eliquis  Lab Results  Component Value Date   PLT 290 08/03/2017    Antibiotics  :    Anti-infectives (From admission, onward)   None        Objective:   Vitals:   08/02/17 2141 08/03/17 0552 08/03/17 2300 08/04/17 0614  BP: 126/66 134/66 (!) 117/51 (!) 107/50  Pulse: 72 67 71 69  Resp: 20 20 20 19   Temp: 98 F (36.7 C) 97.7 F (36.5 C) 98.1 F (36.7 C) 98.1 F (36.7 C)  TempSrc: Oral Oral Oral Oral  SpO2: 99% 95% 96% 96%  Weight:  (!) 146.7 kg (323 lb 8 oz)  (!) 146.6 kg (323 lb 3.1 oz)  Height:        Wt Readings from Last 3 Encounters:  08/04/17 (!) 146.6 kg (323 lb 3.1 oz)  07/26/17 (!) 145.2 kg (320 lb)  07/08/17 (!) 149.1 kg (328 lb 9.6 oz)     Intake/Output Summary (Last 24 hours) at 08/04/2017 1122 Last data filed at 08/04/2017 0829 Gross per 24 hour  Intake 461.67 ml  Output 900 ml  Net -438.33 ml     Physical Exam General: Young obese male not in distress HEENT: Moist mucosa, supple neck Chest: Clear bilaterally neck sign  CVS: Normal S1 and S2, no murmurs EA:VWUJ, nondistended, no epigastric tenderness on exam today, bowel sounds present Musculoskeletal: Warm, no edema    Data Review:    CBC Recent Labs  Lab  07/30/17 0037 07/30/17 0802 07/31/17 1408 08/01/17 0407 08/03/17 0445  WBC 11.0* 9.1 7.8 5.9 10.4  HGB 14.0 12.9* 12.9* 11.7* 12.0*  HCT 43.1 39.8 40.0 36.5* 37.2*  PLT 243 221 244 246 290  MCV 89.8 90.2 89.7 89.7 89.4  MCH 29.2 29.3 28.9 28.7 28.8  MCHC 32.5 32.4 32.3 32.1 32.3  RDW 12.6 12.8 12.6 12.4 12.5  LYMPHSABS 1.8 1.5  --  1.8  --   MONOABS 0.9 0.9  --  0.6  --   EOSABS 0.1 0.2  --  0.2  --   BASOSABS 0.0 0.0  --  0.0  --     Chemistries  Recent Labs  Lab 07/30/17 0037 07/30/17 0802 08/01/17 0407 08/02/17 0842 08/03/17 0445  NA 137 138 136 138 139  K 3.8 4.3 3.6 3.7 3.9  CL 105 106 107 110 107  CO2 26 25 21* 22 24  GLUCOSE 98 87 70 92 97  BUN 12 10 9 7  5*  CREATININE 0.72 0.59* 0.52* 0.57* 0.58*  CALCIUM 9.0 8.4* 8.1* 8.3* 8.6*  AST 33 28 17 17 19   ALT 40 36 22 20 20   ALKPHOS 45 39 34* 33* 33*  BILITOT 1.1 1.2 1.1 1.1 0.7   ------------------------------------------------------------------------------------------------------------------ No results for input(s): CHOL, HDL, LDLCALC, TRIG, CHOLHDL, LDLDIRECT in the last 72 hours.  No results found for: HGBA1C ------------------------------------------------------------------------------------------------------------------ No results for input(s): TSH, T4TOTAL, T3FREE, THYROIDAB in the last 72 hours.  Invalid input(s): FREET3 ------------------------------------------------------------------------------------------------------------------ No results for input(s): VITAMINB12, FOLATE, FERRITIN, TIBC, IRON, RETICCTPCT in the last 72 hours.  Coagulation profile No results for input(s): INR, PROTIME in the last 168 hours.  No results for input(s): DDIMER in the last 72 hours.  Cardiac Enzymes No results for input(s): CKMB, TROPONINI,  MYOGLOBIN in the last 168 hours.  Invalid input(s): CK ------------------------------------------------------------------------------------------------------------------      Component Value Date/Time   BNP 146.9 (H) 01/19/2016 1306    Inpatient Medications  Scheduled Meds: . chlorhexidine  15 mL Mouth Rinse BID  . flecainide  100 mg Oral Q12H  . heparin injection (subcutaneous)  5,000 Units Subcutaneous Q8H  . lipase/protease/amylase  24,000 Units Oral TID AC  . lisinopril  5 mg Oral Daily  . mouth rinse  15 mL Mouth Rinse q12n4p   Continuous Infusions: . famotidine (PEPCID) IV Stopped (08/03/17 2316)   PRN Meds:.acetaminophen **OR** acetaminophen, metoCLOPramide (REGLAN) injection, morphine injection, promethazine  Micro Results No results found for this or any previous visit (from the past 240 hour(s)).  Radiology Reports Ct Abdomen Pelvis W Contrast  Result Date: 08/02/2017 CLINICAL DATA:  Worsening abdominal pain. EXAM: CT ABDOMEN AND PELVIS WITH CONTRAST TECHNIQUE: Multidetector CT imaging of the abdomen and pelvis was performed using the standard protocol following bolus administration of intravenous contrast. CONTRAST:  100 cc Isovue 300 intravenously. COMPARISON:  07/30/2017 FINDINGS: Lower chest: No acute abnormality. Hepatobiliary: No focal liver abnormality is seen. No gallstones, gallbladder wall thickening, or biliary dilatation. Pancreas: Increasing peripancreatic edema. Thick-walled cystic structure anterior to the pancreatic head, abutting the gastric antrum and common bile duct has increased in size with maximum measurement of 8 cm, from prior measurement of 5.2 cm and demonstrates thick and more indistinct walls and increased adjacent mesenteric inflammatory changes. No significant pancreatic ductal dilation. Spleen: Normal in size without focal abnormality. Adrenals/Urinary Tract: Adrenal glands are unremarkable. Kidneys are normal, without renal calculi, focal lesion, or hydronephrosis. Bladder is unremarkable. Stomach/Bowel: Stomach is within normal limits. Appendix appears normal. No evidence of bowel wall thickening, distention, or  inflammatory changes. Vascular/Lymphatic: Persistent upper abdominal retroperitoneal and mesenteric lymph nodes, measuring up to 1.2 cm in short axis. Reproductive: Prostate is unremarkable. Other: No abdominal wall hernia or abnormality. No abdominopelvic ascites. Musculoskeletal: No acute or significant osseous findings. IMPRESSION: Increased peripancreatic edema with significant increase in the size of known pseudocyst ventral to the pancreatic head. This pseudocyst now exhibits indistinct thickened walls and abuts the extrahepatic common bile duct, which could not be separated from it. No upstream biliary dilation is seen. Extensive likely reactive upper abdominal mesenteric and retroperitoneal lymphadenopathy. Electronically Signed   By: Ted Mcalpine M.D.   On: 08/02/2017 22:59   Ct Abdomen Pelvis W Contrast  Result Date: 07/30/2017 CLINICAL DATA:  Right-sided abdominal pain. EXAM: CT ABDOMEN AND PELVIS WITH CONTRAST TECHNIQUE: Multidetector CT imaging of the abdomen and pelvis was performed using the standard protocol following bolus administration of intravenous contrast. CONTRAST:  ISOVUE-300 IOPAMIDOL (ISOVUE-300) INJECTION 61% COMPARISON:  CT 06/04/2017 FINDINGS: Lower chest: Left basilar atelectasis or scarring. No pleural fluid. Hepatobiliary: Prominent liver spanning 20 cm cranial caudal. No focal lesion. Gallbladder physiologically distended, no calcified stone. No biliary dilatation. Pancreas: Peripancreatic stranding diffusely about the pancreas. Thick-walled chronic pseudocyst abutting the gastric antrum appears similar in size measuring 4.0 x 3.2 cm, however now as surrounding soft tissue stranding. Multiple small peripancreatic nodes. No evidence of pancreatic necrosis. Punctate pancreatic calcification in the body. No new fluid collection. Spleen: Normal in size without focal abnormality. Adrenals/Urinary Tract: No adrenal nodule. No hydronephrosis or perinephric edema.  Homogeneous renal enhancement. Urinary bladder is physiologically distended without wall thickening. Stomach/Bowel: Stomach physiologically distended. Mild stranding about the gastric antrum likely secondary to pancreatic inflammation. No bowel obstruction, wall thickening or inflammation. Normal appendix.  Small volume colonic stool. Vascular/Lymphatic: Multiple small upper abdominal peripancreatic nodes have increased, likely reactive. Prominent left mesenteric nodes are again seen. The splenic and portal veins are patent. Normal caliber abdominal aorta. Reproductive: Prostate is unremarkable. Other: No significant ascites. Abdominal fluid collection. No free air. Minimal fat in the inguinal canals. Musculoskeletal: There are no acute or suspicious osseous abnormalities. IMPRESSION: 1. Acute edematous pancreatitis. Chronic right upper quadrant pseudocyst unchanged in size, however also with surrounding inflammation. No new peripancreatic fluid collection or evidence of pancreatic necrosis. 2. Increased number of upper abdominal lymph nodes, likely reactive. Prominent left mesenteric nodes are unchanged. Electronically Signed   By: Rubye OaksMelanie  Ehinger M.D.   On: 07/30/2017 02:57    Time Spent in minutes  25   Nevaen Tredway M.D on 08/04/2017 at 11:22 AM  Between 7am to 7pm - Pager - [phone]  After 7pm go to [email or web address] - password [email or web address]  [facility] -  Office  [phone]

## 2017-08-04 NOTE — Care Management Note (Signed)
Case Management Note  Patient Details  Name: Nathaniel [name] MRN: [id number] Date of Birth: 28-Apr-1991  If discussed at Long Length of Stay Meetings, dates discussed:  08/04/2017  Additional Comments:  Malcolm Metro, RN 08/04/2017, 1:34 PM

## 2017-08-05 DIAGNOSIS — K859 Acute pancreatitis without necrosis or infection, unspecified: Principal | ICD-10-CM

## 2017-08-05 LAB — IGG 4: IgG, Subclass 4: 106 mg/dL — ABNORMAL HIGH (ref 2–96)

## 2017-08-05 MED ORDER — PANTOPRAZOLE SODIUM 40 MG PO TBEC
40.0000 mg | DELAYED_RELEASE_TABLET | Freq: Every day | ORAL | Status: DC
  Administered 2017-08-06: 40 mg via ORAL
  Filled 2017-08-05: qty 1

## 2017-08-05 MED ORDER — PANCRELIPASE (LIP-PROT-AMYL) 12000-38000 UNITS PO CPEP
36000.0000 [IU] | ORAL_CAPSULE | Freq: Three times a day (TID) | ORAL | Status: DC
  Administered 2017-08-06: 36000 [IU] via ORAL
  Filled 2017-08-05: qty 3

## 2017-08-05 NOTE — Progress Notes (Signed)
Subjective: She appears well.  She is feeling significantly better.  Minimal pain.  Alert and clear liquid diets.  No nausea or vomiting.  He had a bowel movement yesterday which was diarrhea but no blood.  No other GI complaints.  Objective: Vital signs in last 24 hours: Temp:  [97.9 F (36.6 C)-98.8 F (37.1 C)] 97.9 F (36.6 C) (12/07 0515) Pulse Rate:  [64-78] 72 (12/07 0515) Resp:  [18-19] 18 (12/07 0515) BP: (108-127)/(62-68) 108/62 (12/07 0515) SpO2:  [95 %-100 %] 95 % (12/07 0515) Weight:  [312 lb 11.2 oz (141.8 kg)] 312 lb 11.2 oz (141.8 kg) (12/07 0515) Last BM Date: 08/02/17 General:   Alert and oriented, pleasant Head:  Normocephalic and atraumatic. Eyes:  No icterus, sclera clear. Conjuctiva pink.  Heart:  S1, S2 present, no murmurs noted.  Lungs: Clear to auscultation bilaterally, without wheezing, rales, or rhonchi.  Abdomen:  Bowel sounds present, soft, non-tender, non-distended. No HSM or hernias noted. No rebound or guarding. No masses appreciated  Pulses:  Normal bilateral DP pulses noted. Extremities:  Without clubbing or edema. Neurologic:  Alert and  oriented x4;  grossly normal neurologically. Psych:  Alert and cooperative. Normal mood and affect.  Intake/Output from previous day: No intake/output data recorded. Intake/Output this shift: No intake/output data recorded.  Lab Results: Recent Labs    08/03/17 0445  WBC 10.4  HGB 12.0*  HCT 37.2*  PLT 290   BMET Recent Labs    08/02/17 0842 08/03/17 0445  NA 138 139  K 3.7 3.9  CL 110 107  CO2 22 24  GLUCOSE 92 97  BUN 7 5*  CREATININE 0.57* 0.58*  CALCIUM 8.3* 8.6*   LFT Recent Labs    08/02/17 0842 08/03/17 0445  PROT 6.7 6.9  ALBUMIN 3.3* 3.4*  AST 17 19  ALT 20 20  ALKPHOS 33* 33*  BILITOT 1.1 0.7   PT/INR No results for input(s): LABPROT, INR in the last 72 hours. Hepatitis Panel No results for input(s): HEPBSAG, HCVAB, HEPAIGM, HEPBIGM in the last 72  hours.   Studies/Results: No results found.  Assessment: 26 year old gentleman admitted with acute pancreatitis, known history of recurrent pancreatitis with pseudocyst as outlined above. Patient reports pain somewhat different this time. More in the epigastrium and just right of midline. Reimaging this admission due to persistent/worsening abdominal pain revealed increasing peripancreatic edema with significant increase in size of known pseudocyst ventral to the pancreatic head. Pseudocyst now exhibiting indistinct thickened walls and abutting the extrahepatic common bile duct, which cannot be separated from it. No upstream biliary dilation. LFTs remain normal.  She continues to have improvement in his abdominal pain.  His pain is minimal now, described as a soreness.  He is tolerating his clear liquid diet without issues.  No nausea or vomiting.  He did have a bowel movement yesterday which was diarrhea but no blood.  IgG 4 still pending.  No other labs today.  There is a question from hospitalist on whether we can advance his diet to full liquid diets and I feel it is appropriate at this time.    Currently the plan is for evaluation and possible EUS and drainage of pancreatic pseudocyst.  He is agreeable to this.  Plan: 1. Advance diet to full liquids 2. Advance further as/if tolerated 3. Continue supportive measures 4. [facility] 5. Supportive measures   Thank you for allowing us to participate in the care of Nathaniel [name]  Eric Gill,  DNP, AGNP-C Adult & Gerontological Nurse Practitioner [facility]    LOS: 6 days    08/05/2017, 8:23 AM

## 2017-08-05 NOTE — Progress Notes (Signed)
Patient requested to be weighed via bed scale as opposed to standing scale due to pain.

## 2017-08-05 NOTE — Progress Notes (Signed)
PROGRESS NOTE                                                                                                                                                                                                             Patient Demographics:    Nathaniel [name], is a 26 y.o. male, DOB - 01-Dec-1990, ZOX:[id number]  Admit date - 07/29/2017   Admitting Physician Briscoe Deutscher, MD  Outpatient Primary MD for the patient is Patient, No Pcp Per  LOS - 6  Outpatient Specialists:None  Chief Complaint  Patient presents with  . Abdominal Pain       Brief Narrative   26 year old male with History of Recurrent pancreatitis With pseudocyst(alcohol induced), Alcohol abuse in remission, paroxysmal A. Fib status post ablation , on anticoagulation, Chronic systolic CHF Presented with severe epigastric pain with nausea for past few days. He was seen in another hospital ED for similar symptoms 3 days back, given pain medication and antiemetics and was discharged home .  symptoms did not improve and patient presented to the ED. In the ED His vitals were stable. He had elevated lipase of 325, WBC of 11 K. CT of the abdomen and pelvis showed acute edematous pancreatitis with unchanged pseudocyst. Patient admitted for acute recurrent pancreatitis.   Subjective:   Abdominal pain improving in the past 24 hours and has required less pain medications. Tolerating clears.   Assessment  & Plan :    Principal Problem:   Acute pancreatitis without infection or necrosis Increasing abdominal pain with worsened lipase on 12/4. Repeat abdominal CT showing increasing peripancreatic edema with increase in size of pseudocyst.  Patient kept nothing by mouth and increased pain medications.  GI consult appreciated. Recommend Conservative management for now. Started on clears on to 12/6 and tolerating well. Abdominal pain better. We discussed with GI if diet can  be advanced further. -GI will arrange outpatient referral to [facility] for EUS with pseudocyst drain after patient is discharged from the hospital.   Active Problems:   Alcohol abuse, in remission Encouraged on cessation. GI suspect patient is dumping his alcohol consumption. 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    Chronic systolic CHF (congestive heart failure) (HCC) Euvolemic. Continue Lisinopril.     Paroxysmal atrial fibrillation (HCC) Rate controlled. Continue flecainide . Continue  eliquis.  Hiccups Improved with Reglan.    Code Status : Full code  Family Communication  : None At bedside  Disposition Plan  : Home once improved  Barriers For Discharge : Active symptoms  Consults  :  GI  Procedures  : CT abdomen pelvis x2  DVT Prophylaxis  : eliquis  Lab Results  Component Value Date   PLT 290 08/03/2017    Antibiotics  :    Anti-infectives (From admission, onward)   None        Objective:   Vitals:   08/04/17 1558 08/04/17 1600 08/04/17 2100 08/05/17 0515  BP:  112/63 127/68 108/62  Pulse: 64 68 78 72  Resp:   19 18  Temp:  98.1 F (36.7 C) 98.8 F (37.1 C) 97.9 F (36.6 C)  TempSrc:  Oral Oral Oral  SpO2:  96% 100% 95%  Weight:    (!) 141.8 kg (312 lb 11.2 oz)  Height:        Wt Readings from Last 3 Encounters:  08/05/17 (!) 141.8 kg (312 lb 11.2 oz)  07/26/17 (!) 145.2 kg (320 lb)  07/08/17 (!) 149.1 kg (328 lb 9.6 oz)    No intake or output data in the 24 hours ending 08/05/17 1124   Physical Exam General: Not in distress HEENT: Moist mucosa, supple neck Chest: Clear bilaterally CVS: Normal S1 and S2, no murmurs GI: Soft, nondistended, nontender, bowel sounds present Musculoskeletal: Warm, no edema      Data Review:    CBC Recent Labs  Lab 07/30/17 0037 07/30/17 0802 07/31/17 1408 08/01/17 0407 08/03/17 0445  WBC 11.0* 9.1 7.8 5.9 10.4  HGB 14.0 12.9* 12.9* 11.7* 12.0*  HCT  43.1 39.8 40.0 36.5* 37.2*  PLT 243 221 244 246 290  MCV 89.8 90.2 89.7 89.7 89.4  MCH 29.2 29.3 28.9 28.7 28.8  MCHC 32.5 32.4 32.3 32.1 32.3  RDW 12.6 12.8 12.6 12.4 12.5  LYMPHSABS 1.8 1.5  --  1.8  --   MONOABS 0.9 0.9  --  0.6  --   EOSABS 0.1 0.2  --  0.2  --   BASOSABS 0.0 0.0  --  0.0  --     Chemistries  Recent Labs  Lab 07/30/17 0037 07/30/17 0802 08/01/17 0407 08/02/17 0842 08/03/17 0445  NA 137 138 136 138 139  K 3.8 4.3 3.6 3.7 3.9  CL 105 106 107 110 107  CO2 26 25 21* 22 24  GLUCOSE 98 87 70 92 97  BUN 12 10 9 7  5*  CREATININE 0.72 0.59* 0.52* 0.57* 0.58*  CALCIUM 9.0 8.4* 8.1* 8.3* 8.6*  AST 33 28 17 17 19   ALT 40 36 22 20 20   ALKPHOS 45 39 34* 33* 33*  BILITOT 1.1 1.2 1.1 1.1 0.7   ------------------------------------------------------------------------------------------------------------------ No results for input(s): CHOL, HDL, LDLCALC, TRIG, CHOLHDL, LDLDIRECT in the last 72 hours.  No results found for: HGBA1C ------------------------------------------------------------------------------------------------------------------ No results for input(s): TSH, T4TOTAL, T3FREE, THYROIDAB in the last 72 hours.  Invalid input(s): FREET3 ------------------------------------------------------------------------------------------------------------------ No results for input(s): VITAMINB12, FOLATE, FERRITIN, TIBC, IRON, RETICCTPCT in the last 72 hours.  Coagulation profile No results for input(s): INR, PROTIME in the last 168 hours.  No results for input(s): DDIMER in the last 72 hours.  Cardiac Enzymes No results for input(s): CKMB, TROPONINI, MYOGLOBIN in the last 168 hours.  Invalid input(s): CK ------------------------------------------------------------------------------------------------------------------    Component Value Date/Time   BNP 146.9 (H) 01/19/2016 1306  Inpatient Medications  Scheduled Meds: . chlorhexidine  15 mL Mouth Rinse BID   . flecainide  100 mg Oral Q12H  . heparin injection (subcutaneous)  5,000 Units Subcutaneous Q8H  . lipase/protease/amylase  24,000 Units Oral TID AC  . lisinopril  5 mg Oral Daily  . mouth rinse  15 mL Mouth Rinse q12n4p   Continuous Infusions: . famotidine (PEPCID) IV Stopped (08/03/17 2316)   PRN Meds:.acetaminophen **OR** acetaminophen, metoCLOPramide (REGLAN) injection, morphine injection, promethazine  Micro Results No results found for this or any previous visit (from the past 240 hour(s)).  Radiology Reports Ct Abdomen Pelvis W Contrast  Result Date: 08/02/2017 CLINICAL DATA:  Worsening abdominal pain. EXAM: CT ABDOMEN AND PELVIS WITH CONTRAST TECHNIQUE: Multidetector CT imaging of the abdomen and pelvis was performed using the standard protocol following bolus administration of intravenous contrast. CONTRAST:  100 cc Isovue 300 intravenously. COMPARISON:  07/30/2017 FINDINGS: Lower chest: No acute abnormality. Hepatobiliary: No focal liver abnormality is seen. No gallstones, gallbladder wall thickening, or biliary dilatation. Pancreas: Increasing peripancreatic edema. Thick-walled cystic structure anterior to the pancreatic head, abutting the gastric antrum and common bile duct has increased in size with maximum measurement of 8 cm, from prior measurement of 5.2 cm and demonstrates thick and more indistinct walls and increased adjacent mesenteric inflammatory changes. No significant pancreatic ductal dilation. Spleen: Normal in size without focal abnormality. Adrenals/Urinary Tract: Adrenal glands are unremarkable. Kidneys are normal, without renal calculi, focal lesion, or hydronephrosis. Bladder is unremarkable. Stomach/Bowel: Stomach is within normal limits. Appendix appears normal. No evidence of bowel wall thickening, distention, or inflammatory changes. Vascular/Lymphatic: Persistent upper abdominal retroperitoneal and mesenteric lymph nodes, measuring up to 1.2 cm in short axis.  Reproductive: Prostate is unremarkable. Other: No abdominal wall hernia or abnormality. No abdominopelvic ascites. Musculoskeletal: No acute or significant osseous findings. IMPRESSION: Increased peripancreatic edema with significant increase in the size of known pseudocyst ventral to the pancreatic head. This pseudocyst now exhibits indistinct thickened walls and abuts the extrahepatic common bile duct, which could not be separated from it. No upstream biliary dilation is seen. Extensive likely reactive upper abdominal mesenteric and retroperitoneal lymphadenopathy. Electronically Signed   By: Ted Mcalpine M.D.   On: 08/02/2017 22:59   Ct Abdomen Pelvis W Contrast  Result Date: 07/30/2017 CLINICAL DATA:  Right-sided abdominal pain. EXAM: CT ABDOMEN AND PELVIS WITH CONTRAST TECHNIQUE: Multidetector CT imaging of the abdomen and pelvis was performed using the standard protocol following bolus administration of intravenous contrast. CONTRAST:  ISOVUE-300 IOPAMIDOL (ISOVUE-300) INJECTION 61% COMPARISON:  CT 06/04/2017 FINDINGS: Lower chest: Left basilar atelectasis or scarring. No pleural fluid. Hepatobiliary: Prominent liver spanning 20 cm cranial caudal. No focal lesion. Gallbladder physiologically distended, no calcified stone. No biliary dilatation. Pancreas: Peripancreatic stranding diffusely about the pancreas. Thick-walled chronic pseudocyst abutting the gastric antrum appears similar in size measuring 4.0 x 3.2 cm, however now as surrounding soft tissue stranding. Multiple small peripancreatic nodes. No evidence of pancreatic necrosis. Punctate pancreatic calcification in the body. No new fluid collection. Spleen: Normal in size without focal abnormality. Adrenals/Urinary Tract: No adrenal nodule. No hydronephrosis or perinephric edema. Homogeneous renal enhancement. Urinary bladder is physiologically distended without wall thickening. Stomach/Bowel: Stomach physiologically distended. Mild  stranding about the gastric antrum likely secondary to pancreatic inflammation. No bowel obstruction, wall thickening or inflammation. Normal appendix. Small volume colonic stool. Vascular/Lymphatic: Multiple small upper abdominal peripancreatic nodes have increased, likely reactive. Prominent left mesenteric nodes are again seen. The splenic and portal veins are  patent. Normal caliber abdominal aorta. Reproductive: Prostate is unremarkable. Other: No significant ascites. Abdominal fluid collection. No free air. Minimal fat in the inguinal canals. Musculoskeletal: There are no acute or suspicious osseous abnormalities. IMPRESSION: 1. Acute edematous pancreatitis. Chronic right upper quadrant pseudocyst unchanged in size, however also with surrounding inflammation. No new peripancreatic fluid collection or evidence of pancreatic necrosis. 2. Increased number of upper abdominal lymph nodes, likely reactive. Prominent left mesenteric nodes are unchanged. Electronically Signed   By: Rubye OaksMelanie  Ehinger M.D.   On: 07/30/2017 02:57    Time Spent in minutes  25   Phillip Sandler M.D on 08/05/2017 at 11:24 AM  Between 7am to 7pm - Pager - [phone]  After 7pm go to [email or web address] - password [email or web address]  [facility] -  Office  [phone]

## 2017-08-06 DIAGNOSIS — K859 Acute pancreatitis without necrosis or infection, unspecified: Secondary | ICD-10-CM | POA: Diagnosis present

## 2017-08-06 DIAGNOSIS — K861 Other chronic pancreatitis: Secondary | ICD-10-CM

## 2017-08-06 MED ORDER — PANCRELIPASE (LIP-PROT-AMYL) 36000-114000 UNITS PO CPEP: 36000.0000 [IU] | ORAL_CAPSULE | Freq: Three times a day (TID) | ORAL | 0 refills | Status: DC

## 2017-08-06 MED ORDER — HYDROCODONE-ACETAMINOPHEN 5-325 MG PO TABS: 2.0000 | ORAL_TABLET | Freq: Four times a day (QID) | ORAL | 0 refills | Status: DC | PRN

## 2017-08-06 NOTE — Discharge Summary (Signed)
Physician Discharge Summary  Nathaniel [name] ONG:[id number] DOB: 12-11-1990 DOA: 07/29/2017  PCP: Patient, No Pcp Per  Admit date: 07/29/2017 Discharge date: 08/06/2017  Admitted From: Home Disposition:  Home  Recommendations for Outpatient Follow-up:  1. Follow up with GI in 2 weeks. GI office will arrange outpatient referral to [facility] management of pancreatic pseudocyst.  Home Health:None Equipment/Devices:None  Discharge Condition:Fair CODE STATUS:Full code Diet recommendation: Soft diet, advance to regular in 2 days.    Discharge Diagnoses:  Principal Problem:   Acute on chronic pancreatitis (HCC)   Active Problems:   Pancreatic pseudocyst   Alcohol abuse, in remission   Chronic systolic CHF (congestive heart failure) (HCC)   Paroxysmal atrial fibrillation (HCC)   Obesity, Class III, BMI 40-49.9 (morbid obesity) (HCC)  Brief narrative/history of present illness Please refer to admission H&P for details, in brief, 26 year old male with History of Recurrent pancreatitis With pseudocyst(alcohol induced), Alcohol abuse in remission, paroxysmal A. Fib status post ablation , on anticoagulation, Chronic systolic CHF Presented with severe epigastric pain with nausea for past few days. He was seen in another hospital ED for similar symptoms 3 days back, given pain medication and antiemetics and was discharged home .  symptoms did not improve and patient presented to the ED. In the ED His vitals were stable. He had elevated lipase of 325, WBC of 11 K. CT of the abdomen and pelvis showed acute edematous pancreatitis with unchanged pseudocyst. Patient admitted for acute recurrent pancreatitis.  Hospital course  Principal Problem:   Acute pancreatitis without infection or necrosis Increasing abdominal pain with worsened lipase on 12/4. Repeat abdominal CT showing increasing peripancreatic edema with increase in size of pseudocyst.  Patient kept nothing by mouth and  increased pain medications.  GI consult appreciated. Recommend Conservative management for now.  Abdominal pain continues to improve and diet slowly advanced. Tolerating soft diet. -Patient stable to be discharged home with outpatient GI follow-up.  -GI will arrange outpatient referral to [facility] for EUS with pseudocyst drain after patient is discharged from the hospital.   Active Problems:   Alcohol abuse, in remission Encouraged on cessation and counseled on complete absence. GI suspect patient is dumping his alcohol consumption. 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    Chronic systolic CHF (congestive heart failure) (HCC) Euvolemic. Continue Lisinopril.     Paroxysmal atrial fibrillation (HCC) Rate controlled. Continue flecainide . Continue eliquis.Follows with Dr. Johney Frame.  Morbid obesity (BMI 42) Counseled on weight loss and exercise.   Code Status : Full code  Family Communication  : None At bedside  Disposition Plan  : Home    Consults  :  GI  Procedures  : CT abdomen pelvis x2     Discharge Instructions   Allergies as of 08/06/2017   No Known Allergies     Medication List    TAKE these medications   diltiazem 30 MG tablet Commonly known as:  CARDIZEM Take 1 tablet every 4 hours AS NEEDED for rapid afib heart rate >100   ELIQUIS 5 MG Tabs tablet Generic drug:  apixaban TAKE 1 TABLET BY MOUTH TWICE DAILY   flecainide 100 MG tablet Commonly known as:  TAMBOCOR TAKE 1 TABLET(100 MG) BY MOUTH TWICE DAILY   HYDROcodone-acetaminophen 5-325 MG tablet Commonly known as:  NORCO/VICODIN Take 2 tablets by mouth every 6 (six) hours as needed. What changed:  when to take this   lipase/protease/amylase 44010 UNITS  Cpep capsule Commonly known as:  CREON Take 1 capsule (36,000 Units total) by mouth 3 (three) times daily before meals.   lisinopril 5 MG tablet Commonly known as:   PRINIVIL,ZESTRIL TAKE 1 TABLET(5 MG) BY MOUTH DAILY   metoprolol succinate 25 MG 24 hr tablet Commonly known as:  TOPROL-XL TAKE 1 TABLET(25 MG) BY MOUTH DAILY   ondansetron 4 MG disintegrating tablet Commonly known as:  ZOFRAN ODT Take 1 tablet (4 mg total) by mouth every 8 (eight) hours as needed for nausea or vomiting.   ranitidine 150 MG capsule Commonly known as:  ZANTAC Take 150 mg by mouth 2 (two) times daily.   triamcinolone cream 0.1 % Commonly known as:  KENALOG Apply 1 application topically every evening.      Follow-up Information    Rourk, Gerrit Friends, MD. Schedule an appointment as soon as possible for a visit in 2 week(s).   Specialty:  Gastroenterology Contact information: [address] [place] [address] [phone]          No Known Allergies      Procedures/Studies: Ct Abdomen Pelvis W Contrast  Result Date: 08/02/2017 CLINICAL DATA:  Worsening abdominal pain. EXAM: CT ABDOMEN AND PELVIS WITH CONTRAST TECHNIQUE: Multidetector CT imaging of the abdomen and pelvis was performed using the standard protocol following bolus administration of intravenous contrast. CONTRAST:  100 cc Isovue 300 intravenously. COMPARISON:  07/30/2017 FINDINGS: Lower chest: No acute abnormality. Hepatobiliary: No focal liver abnormality is seen. No gallstones, gallbladder wall thickening, or biliary dilatation. Pancreas: Increasing peripancreatic edema. Thick-walled cystic structure anterior to the pancreatic head, abutting the gastric antrum and common bile duct has increased in size with maximum measurement of 8 cm, from prior measurement of 5.2 cm and demonstrates thick and more indistinct walls and increased adjacent mesenteric inflammatory changes. No significant pancreatic ductal dilation. Spleen: Normal in size without focal abnormality. Adrenals/Urinary Tract: Adrenal glands are unremarkable. Kidneys are normal, without renal calculi, focal lesion, or hydronephrosis.  Bladder is unremarkable. Stomach/Bowel: Stomach is within normal limits. Appendix appears normal. No evidence of bowel wall thickening, distention, or inflammatory changes. Vascular/Lymphatic: Persistent upper abdominal retroperitoneal and mesenteric lymph nodes, measuring up to 1.2 cm in short axis. Reproductive: Prostate is unremarkable. Other: No abdominal wall hernia or abnormality. No abdominopelvic ascites. Musculoskeletal: No acute or significant osseous findings. IMPRESSION: Increased peripancreatic edema with significant increase in the size of known pseudocyst ventral to the pancreatic head. This pseudocyst now exhibits indistinct thickened walls and abuts the extrahepatic common bile duct, which could not be separated from it. No upstream biliary dilation is seen. Extensive likely reactive upper abdominal mesenteric and retroperitoneal lymphadenopathy. Electronically Signed   By: Ted Mcalpine M.D.   On: 08/02/2017 22:59   Ct Abdomen Pelvis W Contrast  Result Date: 07/30/2017 CLINICAL DATA:  Right-sided abdominal pain. EXAM: CT ABDOMEN AND PELVIS WITH CONTRAST TECHNIQUE: Multidetector CT imaging of the abdomen and pelvis was performed using the standard protocol following bolus administration of intravenous contrast. CONTRAST:  ISOVUE-300 IOPAMIDOL (ISOVUE-300) INJECTION 61% COMPARISON:  CT 06/04/2017 FINDINGS: Lower chest: Left basilar atelectasis or scarring. No pleural fluid. Hepatobiliary: Prominent liver spanning 20 cm cranial caudal. No focal lesion. Gallbladder physiologically distended, no calcified stone. No biliary dilatation. Pancreas: Peripancreatic stranding diffusely about the pancreas. Thick-walled chronic pseudocyst abutting the gastric antrum appears similar in size measuring 4.0 x 3.2 cm, however now as surrounding soft tissue stranding. Multiple small peripancreatic nodes. No evidence of pancreatic necrosis. Punctate pancreatic calcification in the  body. No new fluid  collection. Spleen: Normal in size without focal abnormality. Adrenals/Urinary Tract: No adrenal nodule. No hydronephrosis or perinephric edema. Homogeneous renal enhancement. Urinary bladder is physiologically distended without wall thickening. Stomach/Bowel: Stomach physiologically distended. Mild stranding about the gastric antrum likely secondary to pancreatic inflammation. No bowel obstruction, wall thickening or inflammation. Normal appendix. Small volume colonic stool. Vascular/Lymphatic: Multiple small upper abdominal peripancreatic nodes have increased, likely reactive. Prominent left mesenteric nodes are again seen. The splenic and portal veins are patent. Normal caliber abdominal aorta. Reproductive: Prostate is unremarkable. Other: No significant ascites. Abdominal fluid collection. No free air. Minimal fat in the inguinal canals. Musculoskeletal: There are no acute or suspicious osseous abnormalities. IMPRESSION: 1. Acute edematous pancreatitis. Chronic right upper quadrant pseudocyst unchanged in size, however also with surrounding inflammation. No new peripancreatic fluid collection or evidence of pancreatic necrosis. 2. Increased number of upper abdominal lymph nodes, likely reactive. Prominent left mesenteric nodes are unchanged. Electronically Signed   By: Rubye Oaks M.D.   On: 07/30/2017 02:57       Subjective:  Abdominal pain much improved. Has not required any pain medications in the past 12 hours  Discharge Exam: Vitals:   08/05/17 2211 08/06/17 0700  BP: 140/85 127/61  Pulse: 74 69  Resp: 20   Temp: 98 F (36.7 C) 97.7 F (36.5 C)  SpO2: 99% 97%   Vitals:   08/05/17 0515 08/05/17 1500 08/05/17 2211 08/06/17 0700  BP: 108/62 115/62 140/85 127/61  Pulse: 72 65 74 69  Resp: 18 20 20    Temp: 97.9 F (36.6 C) 98.4 F (36.9 C) 98 F (36.7 C) 97.7 F (36.5 C)  TempSrc: Oral Oral Oral   SpO2: 95% 97% 99% 97%  Weight: (!) 141.8 kg (312 lb 11.2 oz)   (!) 141.8 kg  (312 lb 9.8 oz)  Height:         General: Not in distress HEENT: Moist mucosa, supple neck Chest: Clear bilaterally CVS: Normal S1 and S2, no murmurs GI: Soft, nondistended, nontender, bowel sounds present Musculoskeletal: Warm, no edema    The results of significant diagnostics from this hospitalization (including imaging, microbiology, ancillary and laboratory) are listed below for reference.     Microbiology: No results found for this or any previous visit (from the past 240 hour(s)).   Labs: BNP (last 3 results) No results for input(s): BNP in the last 8760 hours. Basic Metabolic Panel: Recent Labs  Lab 08/01/17 0407 08/02/17 0842 08/03/17 0445  NA 136 138 139  K 3.6 3.7 3.9  CL 107 110 107  CO2 21* 22 24  GLUCOSE 70 92 97  BUN 9 7 5*  CREATININE 0.52* 0.57* 0.58*  CALCIUM 8.1* 8.3* 8.6*   Liver Function Tests: Recent Labs  Lab 08/01/17 0407 08/02/17 0842 08/03/17 0445  AST 17 17 19   ALT 22 20 20   ALKPHOS 34* 33* 33*  BILITOT 1.1 1.1 0.7  PROT 6.6 6.7 6.9  ALBUMIN 3.2* 3.3* 3.4*   Recent Labs  Lab 08/02/17 0842 08/03/17 0445  LIPASE 734* 306*   No results for input(s): AMMONIA in the last 168 hours. CBC: Recent Labs  Lab 07/31/17 1408 08/01/17 0407 08/03/17 0445  WBC 7.8 5.9 10.4  NEUTROABS  --  3.2  --   HGB 12.9* 11.7* 12.0*  HCT 40.0 36.5* 37.2*  MCV 89.7 89.7 89.4  PLT 244 246 290   Cardiac Enzymes: No results for input(s): CKTOTAL, CKMB, CKMBINDEX, TROPONINI in the last 168  hours. BNP: Invalid input(s): POCBNP CBG: No results for input(s): GLUCAP in the last 168 hours. D-Dimer No results for input(s): DDIMER in the last 72 hours. Hgb A1c No results for input(s): HGBA1C in the last 72 hours. Lipid Profile No results for input(s): CHOL, HDL, LDLCALC, TRIG, CHOLHDL, LDLDIRECT in the last 72 hours. Thyroid function studies No results for input(s): TSH, T4TOTAL, T3FREE, THYROIDAB in the last 72 hours.  Invalid input(s):  FREET3 Anemia work up No results for input(s): VITAMINB12, FOLATE, FERRITIN, TIBC, IRON, RETICCTPCT in the last 72 hours. Urinalysis    Component Value Date/Time   COLORURINE AMBER (A) 07/29/2017 2354   APPEARANCEUR HAZY (A) 07/29/2017 2354   LABSPEC 1.030 07/29/2017 2354   PHURINE 5.0 07/29/2017 2354   GLUCOSEU NEGATIVE 07/29/2017 2354   HGBUR NEGATIVE 07/29/2017 2354   BILIRUBINUR NEGATIVE 07/29/2017 2354   KETONESUR 5 (A) 07/29/2017 2354   PROTEINUR 30 (A) 07/29/2017 2354   UROBILINOGEN 1.0 01/14/2014 0032   NITRITE NEGATIVE 07/29/2017 2354   LEUKOCYTESUR MODERATE (A) 07/29/2017 2354   Sepsis Labs Invalid input(s): PROCALCITONIN,  WBC,  LACTICIDVEN Microbiology No results found for this or any previous visit (from the past 240 hour(s)).   Time coordinating discharge: Over 30 minutes  SIGNED:   Eddie NorthNishant Dejon Lukas, MD  [facility] 08/06/2017, 11:09 AM Pager   If 7PM-7AM, please contact night-coverage [email or web address] Password [email or web address]

## 2017-08-06 NOTE — Discharge Instructions (Signed)
Chronic Pancreatitis Chronic pancreatitis is long-lasting inflammation and scarring of the pancreas. The pancreas is a gland that is located behind the stomach. It produces enzymes that help to digest food. The pancreas also releases the hormones glucagon and insulin, which help to regulate blood sugar. Damage to the pancreas may affect digestion, cause pain in the upper abdomen and back, and cause diabetes. Inflammation can also irritate other abdominal organs near the pancreas. At the very beginning, pancreatitis may be sudden (acute). If acute pancreatitis is not caught in time or treated effectively, or if you have several or prolonged episodes of acute pancreatitis, then the condition can turn into chronic pancreatitis. What are the causes? The most common cause of this condition is alcohol abuse. Other causes include:  High levels of triglycerides in the blood (hypertriglyceridemia).  Gallstones or other conditions that can block the tube that drains the pancreas (pancreatic duct).  Pancreatic cancer.  Cystic fibrosis.  Too much calcium in the blood (hypercalcemia), which may be caused by an overactive parathyroid gland (hyperparathyroidism).  Certain medicines.  Injury to the pancreas.  Infection.  Autoimmune pancreatitis. This is when the body's disease-fighting (immune) system attacks the pancreas.  Genes that are passed along from parent to child (inherited).  In some cases, the cause may not be known. What increases the risk? This condition is more likely to develop in:  Men.  People who are 40-60 years old.  What are the signs or symptoms? Symptoms of this condition may include:  Abdominal pain. Pain may also be felt in the upper back and may get worse after eating.  Nausea and vomiting.  Fever.  Weight loss.  A change in the color and consistency of bowel movements, such as diarrhea.  How is this diagnosed? This condition is diagnosed based on your  symptoms, your medical history, and a physical exam. You may have tests, such as:  Blood tests.  Stool samples.  Biopsy of the pancreas. This is the removal of a small amount of pancreas tissue to be tested in a lab.  Imaging studies, such as: ? X-rays. ? CT scan. ? MRI. ? Ultrasound.  How is this treated? The goal of treatment is to help relieve symptoms and to prevent complications from occurring. Treatment focuses on:  Resting the pancreas. You may need to stop eating and drinking for a few days while in the hospital to give your pancreas time to recover. During this time, you will be given IV fluids to keep you hydrated.  Controlling pain. You may be given pain medicines by mouth (orally) or as injections.  Improving digestion. You may be given: ? Medicines to help balance your enzymes. ? Vitamin supplements. ? A specific diet to follow. If you are given a diet, you may work with a specialist (dietitian).  Preventing diabetes. You may need insulin injections.  You may have surgery to:  Clear the pancreatic ducts of any blockages, such as gallstones.  Remove any fluid or damaged tissue from the pancreas.  Follow these instructions at home:  Take over-the-counter and prescription medicines only as told by your health care provider. This includes any vitamin supplements.  Do not drive or operate heavy machinery while taking prescription pain medicines.  Drink enough fluid to keep your urine clear or pale yellow.  Do not drink alcohol. If you need help quitting, ask your health care provider.  Do not use any tobacco products, such as cigarettes, chewing tobacco, and e-cigarettes. If you need help   quitting, ask your health care provider.  Follow a diet as told by your health care provider or dietitian, if this applies. This may include: ? Limiting how much fat you eat. ? Eating smaller meals more often. ? Avoiding caffeine.  Keep all follow-up visits as told by your  health care provider. This is important. Contact a health care provider if:  You have pain that does not get better with medicine.  You have a fever. Get help right away if:  Your pain suddenly gets worse.  You have sudden abdominal swelling.  You start to vomit often or you vomit blood.  You have diarrhea that does not go away.  You have blood in your stool. This information is not intended to replace advice given to you by your health care provider. Make sure you discuss any questions you have with your health care provider. Document Released: 09/12/2015 Document Revised: 01/22/2016 Document Reviewed: 07/24/2014 Elsevier Interactive Patient Education  2018 [employer] Inc[employer]  

## 2017-09-06 DIAGNOSIS — M79671 Pain in right foot: Secondary | ICD-10-CM | POA: Diagnosis not present

## 2017-09-06 DIAGNOSIS — L03031 Cellulitis of right toe: Secondary | ICD-10-CM | POA: Diagnosis not present

## 2017-09-06 DIAGNOSIS — M79674 Pain in right toe(s): Secondary | ICD-10-CM | POA: Diagnosis not present

## 2017-09-06 DIAGNOSIS — L600 Ingrowing nail: Secondary | ICD-10-CM | POA: Diagnosis not present

## 2017-09-14 DIAGNOSIS — I482 Chronic atrial fibrillation: Secondary | ICD-10-CM | POA: Diagnosis not present

## 2017-09-14 DIAGNOSIS — I10 Essential (primary) hypertension: Secondary | ICD-10-CM | POA: Diagnosis not present

## 2017-09-14 DIAGNOSIS — Z6841 Body Mass Index (BMI) 40.0 and over, adult: Secondary | ICD-10-CM | POA: Diagnosis not present

## 2017-09-14 DIAGNOSIS — K8581 Other acute pancreatitis with uninfected necrosis: Secondary | ICD-10-CM | POA: Diagnosis not present

## 2017-09-14 DIAGNOSIS — E668 Other obesity: Secondary | ICD-10-CM | POA: Diagnosis not present

## 2017-09-27 DIAGNOSIS — M79671 Pain in right foot: Secondary | ICD-10-CM | POA: Diagnosis not present

## 2017-09-27 DIAGNOSIS — M79674 Pain in right toe(s): Secondary | ICD-10-CM | POA: Diagnosis not present

## 2017-09-27 DIAGNOSIS — L600 Ingrowing nail: Secondary | ICD-10-CM | POA: Diagnosis not present

## 2017-09-27 DIAGNOSIS — L03031 Cellulitis of right toe: Secondary | ICD-10-CM | POA: Diagnosis not present

## 2017-09-30 ENCOUNTER — Ambulatory Visit: Payer: BLUE CROSS/BLUE SHIELD | Admitting: Internal Medicine

## 2017-10-13 ENCOUNTER — Other Ambulatory Visit (HOSPITAL_COMMUNITY): Payer: Self-pay | Admitting: Nurse Practitioner

## 2017-11-18 ENCOUNTER — Encounter: Payer: Self-pay | Admitting: Internal Medicine

## 2017-11-18 ENCOUNTER — Other Ambulatory Visit: Payer: Self-pay

## 2017-11-18 ENCOUNTER — Ambulatory Visit: Payer: BLUE CROSS/BLUE SHIELD | Admitting: Internal Medicine

## 2017-11-18 ENCOUNTER — Other Ambulatory Visit (HOSPITAL_COMMUNITY): Payer: Self-pay | Admitting: Nurse Practitioner

## 2017-11-18 VITALS — BP 128/85 | HR 65 | Ht 72.0 in | Wt 335.0 lb

## 2017-11-18 DIAGNOSIS — I484 Atypical atrial flutter: Secondary | ICD-10-CM | POA: Diagnosis not present

## 2017-11-18 DIAGNOSIS — I480 Paroxysmal atrial fibrillation: Secondary | ICD-10-CM | POA: Diagnosis not present

## 2017-11-18 MED ORDER — FLECAINIDE ACETATE 50 MG PO TABS: 50.0000 mg | ORAL_TABLET | Freq: Two times a day (BID) | ORAL | 6 refills | Status: DC

## 2017-11-18 NOTE — Patient Instructions (Signed)
Medication Instructions:   Decrease Flecainide to 50mg  twice a day.  Office will contact with results via phone or letter.    Labwork: none  Testing/Procedures: none  Follow-Up: 3 months   Any Other Special Instructions Will Be Listed Below (If Applicable).  If you need a refill on your cardiac medications before your next appointment, please call your pharmacy.

## 2017-11-18 NOTE — Progress Notes (Signed)
PCP: Patient, No Pcp Per   Primary EP: Dr Johney Frame  Aquino Galo is a 27 y.o. male who presents today for routine electrophysiology followup.  Since last being seen in our clinic, the patient reports doing reasonably well.  He has had several hospitalizations for pancreatitis.  Doing better now on Creon.  Feels afib is controlled.  + rare palpitations.  Today, he denies symptoms of  chest pain, shortness of breath,  lower extremity edema, dizziness, presyncope, or syncope.  The patient is otherwise without complaint today.   Past Medical History:  Diagnosis Date  . Atrial flutter (HCC) 01/17/2016   Admx 5/17 with AFlutter with RVR in setting of pancreatitis // CHADS2-VASc=1 (CHF) // Anticoag not started // Amiodarone >> NSR   . Chronic lower back pain   . Chronic systolic CHF (congestive heart failure) (HCC)    Echo 01/17/16 - EF 35-40%, diffuse HK, mild LAE   . 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HCC) 02/03/2016   a. EF 35-40% in setting of AFlutter with RVR in 5/17 // probably tachycardia mediated // needs FU echo 2-3 mos after NSR restored // b. Limited Echo 7/17: EF 45-50%, diff HK, mild LAE  . Depression    "last 2-3 months; never treated for it" (12/09/2016)  . Fatty liver 12/2015   per CT  . Headache    "monthly" (12/09/2016)  . Obesity    BMI 45 12/2015  . Pancreatitis 2012   recurrent acute. Admissions to [facility], 12/2015 admissin to [facility] hospital, ct then shows pseudocyst.   . Pancreatitis 2012 X 2; 2017 X 2  . Paroxysmal atrial fibrillation (HCC)   . Typical atrial flutter (HCC) 06/06/2017   Past Surgical History:  Procedure Laterality Date  . ATRIAL FIBRILLATION ABLATION N/A 12/09/2016   Procedure: Atrial Fibrillation Ablation;  Surgeon: Hillis Range, MD;  Location: [facility] INVASIVE CV LAB;  Service: Cardiovascular;  Laterality: N/A;  . FRACTURE SURGERY    . HAND SURGERY Right ~ 2008   "had pins in it; pulled them"  . TEE WITHOUT CARDIOVERSION N/A 12/08/2016   Procedure: TRANSESOPHAGEAL  ECHOCARDIOGRAM (TEE);  Surgeon: Jake Bathe, MD;  Location: [facility] ENDOSCOPY;  Service: Cardiovascular;  Laterality: N/A;  . TONSILLECTOMY AND ADENOIDECTOMY     "in my teens"    ROS- all systems are reviewed and negatives except as per HPI above  Current Outpatient Medications  Medication Sig Dispense Refill  . diltiazem (CARDIZEM) 30 MG tablet Take 1 tablet every 4 hours AS NEEDED for rapid afib heart rate >100 30 tablet 2  . ELIQUIS 5 MG TABS tablet TAKE 1 TABLET BY MOUTH TWICE DAILY 60 tablet 6  . flecainide (TAMBOCOR) 100 MG tablet TAKE 1 TABLET(100 MG) BY MOUTH TWICE DAILY 60 tablet 0  . HYDROcodone-acetaminophen (NORCO/VICODIN) 5-325 MG tablet Take 2 tablets by mouth every 6 (six) hours as needed. 10 tablet 0  . lipase/protease/amylase (CREON) 36000 UNITS CPEP capsule Take 1 capsule (36,000 Units total) by mouth 3 (three) times daily before meals. 270 capsule 0  . lisinopril (PRINIVIL,ZESTRIL) 5 MG tablet TAKE 1 TABLET(5 MG) BY MOUTH DAILY 90 tablet 3  . metoprolol succinate (TOPROL-XL) 25 MG 24 hr tablet TAKE 1 TABLET(25 MG) BY MOUTH DAILY 90 tablet 2  . ondansetron (ZOFRAN ODT) 4 MG disintegrating tablet Take 1 tablet (4 mg total) by mouth every 8 (eight) hours as needed for nausea or vomiting. 6 tablet 0  . ranitidine (ZANTAC) 150 MG capsule Take 150 mg by mouth 2 (two) times  daily.    . triamcinolone cream (KENALOG) 0.1 % Apply 1 application topically every evening.     No current facility-administered medications for this visit.     Physical Exam: Vitals:   11/18/17 1254  BP: 128/85  Pulse: 65  SpO2: 95%  Weight: (!) 335 lb (152 kg)  Height: 6' (1.829 m)    GEN- The patient is well appearing, alert and oriented x 3 today.   Head- normocephalic, atraumatic Eyes-  Sclera clear, conjunctiva pink Ears- hearing intact Oropharynx- clear Lungs- Clear to ausculation bilaterally, normal work of breathing Heart- Regular rate and rhythm, no murmurs, rubs or gallops, PMI not  laterally displaced GI- soft, NT, ND, + BS Extremities- no clubbing, cyanosis, or edema  EKG tracing ordered today is personally reviewed and shows sinus rhythm  Assessment and Plan:  1. Paroxysmal atrial fibrillation/ atrial flutter Doing well s/p ablation Reluctant to stop eliquis or flecainide. I have advised stopping eliquis.  He will consider this in the future For now, he is willing to reduce flecainide to 50mg  BID Lifestyle modification again discussed today Consider repeat ablation if further arrhythmias  2. Obesity Body mass index is 45.43 kg/m. lifesytle modification is essential  3. Nonischemic CM Resolved with sinus rhythm  Return to see me in 3 months  Hillis Range MD, [facility] 11/18/2017 1:06 PM

## 2017-11-23 ENCOUNTER — Telehealth: Payer: Self-pay | Admitting: Internal Medicine

## 2017-11-23 NOTE — Telephone Encounter (Signed)
Mailbox full, cannot accept messages

## 2017-11-23 NOTE — Telephone Encounter (Signed)
Nathaniel [name] called stating that the flecainide (TAMBOCOR) 50 MG  Is not working. He states that he has had to double up on the medication. Please call [phone].

## 2017-11-24 NOTE — Telephone Encounter (Signed)
Stated that he felt like he was back in Atrial Fib.  Stated that he could feel heart jumping in and out of rhythm.  Stated he did go back to the 100mg  twice a day this past Monday morning & feeling much better and back to normal.

## 2017-12-03 ENCOUNTER — Other Ambulatory Visit: Payer: Self-pay | Admitting: Internal Medicine

## 2017-12-05 NOTE — Telephone Encounter (Signed)
Age 28 years WT 152 kg  11/18/2017 08/03/2017 SrCr 0.58 08/03/2017 Hgb 12.0 HCT 37.2 Saw Dr Allred on 11/18/2017  Refill done for Eliquis 5mg  q 12 hours as requested

## 2018-01-06 ENCOUNTER — Other Ambulatory Visit (HOSPITAL_COMMUNITY): Payer: Self-pay | Admitting: Nurse Practitioner

## 2018-01-06 ENCOUNTER — Telehealth: Payer: Self-pay | Admitting: Internal Medicine

## 2018-01-06 MED ORDER — FLECAINIDE ACETATE 50 MG PO TABS: 50.0000 mg | ORAL_TABLET | Freq: Two times a day (BID) | ORAL | 1 refills | Status: DC

## 2018-01-06 NOTE — Telephone Encounter (Signed)
° °  1. Which medications need to be refilled? (please list name of each medication and dose if known) flecainide (TAMBOCOR) 50 MG    2. Which pharmacy/location (including street and city if local pharmacy) is medication to be sent to    [facility], [place]  Patient states that he is leaving to go out of town and needs asap     please call patient   [phone] to confirm   3. Do they need a 30 day or 90 day supply?

## 2018-01-06 NOTE — Telephone Encounter (Signed)
Flecainide sent to [facility] 50 mg bid - pt says he would like a new rx for 100 mg bid since he has been taking this dose since 11/23/17 - will forward to provider if ok to send flecainide 100 mg to pharmacy

## 2018-01-08 NOTE — Telephone Encounter (Signed)
OK 

## 2018-01-09 MED ORDER — FLECAINIDE ACETATE 100 MG PO TABS: 100.0000 mg | ORAL_TABLET | Freq: Two times a day (BID) | ORAL | 1 refills | Status: DC

## 2018-01-09 NOTE — Telephone Encounter (Signed)
Updated rx sent to pharmacy

## 2018-02-21 DIAGNOSIS — Z0000 Encounter for general adult medical examination without abnormal findings: Secondary | ICD-10-CM | POA: Diagnosis not present

## 2018-02-21 DIAGNOSIS — K8581 Other acute pancreatitis with uninfected necrosis: Secondary | ICD-10-CM | POA: Diagnosis not present

## 2018-02-21 DIAGNOSIS — I10 Essential (primary) hypertension: Secondary | ICD-10-CM | POA: Diagnosis not present

## 2018-02-21 DIAGNOSIS — I482 Chronic atrial fibrillation: Secondary | ICD-10-CM | POA: Diagnosis not present

## 2018-02-21 DIAGNOSIS — Z6841 Body Mass Index (BMI) 40.0 and over, adult: Secondary | ICD-10-CM | POA: Diagnosis not present

## 2018-02-21 DIAGNOSIS — E668 Other obesity: Secondary | ICD-10-CM | POA: Diagnosis not present

## 2018-04-01 ENCOUNTER — Other Ambulatory Visit: Payer: Self-pay | Admitting: Internal Medicine

## 2018-04-09 ENCOUNTER — Other Ambulatory Visit: Payer: Self-pay | Admitting: Internal Medicine

## 2018-05-05 ENCOUNTER — Ambulatory Visit: Payer: BLUE CROSS/BLUE SHIELD | Admitting: Internal Medicine

## 2018-05-05 ENCOUNTER — Encounter: Payer: Self-pay | Admitting: Internal Medicine

## 2018-05-05 VITALS — BP 100/62 | HR 76 | Ht 72.0 in | Wt 335.0 lb

## 2018-05-05 DIAGNOSIS — I480 Paroxysmal atrial fibrillation: Secondary | ICD-10-CM | POA: Diagnosis not present

## 2018-05-05 DIAGNOSIS — I484 Atypical atrial flutter: Secondary | ICD-10-CM

## 2018-05-05 NOTE — Patient Instructions (Signed)
Medication Instructions:  Continue all current medications.  Labwork: none  Testing/Procedures: none  Follow-Up: Your physician wants you to follow up in: 6 months.  You will receive a reminder letter in the mail one-two months in advance.  If you don't receive a letter, please call our office to schedule the follow up appointment   Any Other Special Instructions Will Be Listed Below (If Applicable). You have been referred to:  Dr. Delrae Alfred at [facility]   If you need a refill on your cardiac medications before your next appointment, please call your pharmacy.

## 2018-05-05 NOTE — Progress Notes (Signed)
PCP: Toma Deiters, MD   Primary EP: Dr Johney Frame  Nathaniel [name] is a 27 y.o. male who presents today for routine electrophysiology followup.  Since last being seen in our clinic, the patient reports doing very well.  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  Today, he denies symptoms of palpitations, chest pain, shortness of breath,  lower extremity edema, dizziness, presyncope, or syncope.  The patient is otherwise without complaint today.   Past Medical History:  Diagnosis Date  . Atrial flutter (HCC) 01/17/2016   Admx 5/17 with AFlutter with RVR in setting of pancreatitis // CHADS2-VASc=1 (CHF) // Anticoag not started // Amiodarone >> NSR   . Chronic lower back pain   . Chronic systolic CHF (congestive heart failure) (HCC)    Echo 01/17/16 - EF 35-40%, diffuse HK, mild LAE   . 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HCC) 02/03/2016   a. EF 35-40% in setting of AFlutter with RVR in 5/17 // probably tachycardia mediated // needs FU echo 2-3 mos after NSR restored // b. Limited Echo 7/17: EF 45-50%, diff HK, mild LAE  . Depression    "last 2-3 months; never treated for it" (12/09/2016)  . Fatty liver 12/2015   per CT  . Headache    "monthly" (12/09/2016)  . Obesity    BMI 45 12/2015  . Pancreatitis 2012   recurrent acute. Admissions to [facility], 12/2015 admissin to [facility] hospital, ct then shows pseudocyst.   . Pancreatitis 2012 X 2; 2017 X 2  . Paroxysmal atrial fibrillation (HCC)   . Typical atrial flutter (HCC) 06/06/2017   Past Surgical History:  Procedure Laterality Date  . ATRIAL FIBRILLATION ABLATION N/A 12/09/2016   Procedure: Atrial Fibrillation Ablation;  Surgeon: Hillis Range, MD;  Location: [facility] INVASIVE CV LAB;  Service: Cardiovascular;  Laterality: N/A;  . FRACTURE SURGERY    . HAND SURGERY Right ~ 2008   "had pins in it; pulled them"  . TEE WITHOUT CARDIOVERSION N/A 12/08/2016   Procedure: TRANSESOPHAGEAL ECHOCARDIOGRAM (TEE);  Surgeon: Jake Bathe, MD;  Location: [facility] ENDOSCOPY;   Service: Cardiovascular;  Laterality: N/A;  . TONSILLECTOMY AND ADENOIDECTOMY     "in my teens"    ROS- all systems are reviewed and negatives except as per HPI above  Current Outpatient Medications  Medication Sig Dispense Refill  . ELIQUIS 5 MG TABS tablet TAKE 1 TABLET BY MOUTH TWICE DAILY 60 tablet 5  . flecainide (TAMBOCOR) 100 MG tablet Take 1 tablet (100 mg total) by mouth 2 (two) times daily. 180 tablet 1  . HYDROcodone-acetaminophen (NORCO/VICODIN) 5-325 MG tablet Take 2 tablets by mouth every 6 (six) hours as needed. 10 tablet 0  . lipase/protease/amylase (CREON) 36000 UNITS CPEP capsule Take 1 capsule (36,000 Units total) by mouth 3 (three) times daily before meals. 270 capsule 0  . lisinopril (PRINIVIL,ZESTRIL) 5 MG tablet TAKE 1 TABLET(5 MG) BY MOUTH DAILY 90 tablet 0  . metoprolol succinate (TOPROL-XL) 25 MG 24 hr tablet TAKE 1 TABLET(25 MG) BY MOUTH DAILY 90 tablet 3  . ranitidine (ZANTAC) 150 MG capsule Take 150 mg by mouth 2 (two) times daily.    [name] triamcinolone cream (KENALOG) 0.1 % Apply 1 application topically every evening.     No current facility-administered medications for this visit.     Physical Exam: Vitals:   05/05/18 0830  BP: 100/62  Pulse: 76  SpO2: 96%  Weight: (!) 335 lb (152 kg)  Height: 6' (1.829 m)    GEN- The patient is  well appearing, alert and oriented x 3 today.   Head- normocephalic, atraumatic Eyes-  Sclera clear, conjunctiva pink Ears- hearing intact Oropharynx- clear Lungs- Clear to ausculation bilaterally, normal work of breathing Heart- Regular rate and rhythm, no murmurs, rubs or gallops, PMI not laterally displaced GI- soft, NT, ND, + BS Extremities- no clubbing, cyanosis, or edema  Wt Readings from Last 3 Encounters:  05/05/18 (!) 335 lb (152 kg)  11/18/17 (!) 335 lb (152 kg)  08/06/17 (!) 312 lb 9.8 oz (141.8 kg)    EKG tracing ordered today is personally reviewed and shows sinus rhythm 74 bpm, PR , QRS 94 msec,  Qtc 415 msec  Assessment and Plan:  1. Paroxysmal atrial fibrillation/ atrial flutter Doing well s/p ablation Lifestyle modification again encouraged He wishes to continue both eliquis and flecainide.  We discussed his chads2vasc score of 1, guidelines, and risks of bleeding at length today.  2. Obesity Body mass index is 45.43 kg/m. We are heading in the wrong direction Discussed at length today I will refer to Dr Lily Peer at [facility] for consideration of weight loss surgery  3. Previous tachycardia mediated CM Resolved with sinus rhythm  Return in 6 months  Hillis Range MD, [facility] 05/05/2018 9:05 AM

## 2018-05-09 ENCOUNTER — Inpatient Hospital Stay (HOSPITAL_COMMUNITY)
Admission: EM | Admit: 2018-05-09 | Discharge: 2018-05-12 | DRG: 439 | Disposition: A | Discharge status: Home / Self Care | Payer: BLUE CROSS/BLUE SHIELD | Attending: Internal Medicine | Admitting: Internal Medicine

## 2018-05-09 ENCOUNTER — Encounter (HOSPITAL_COMMUNITY): Payer: Self-pay | Admitting: Emergency Medicine

## 2018-05-09 ENCOUNTER — Emergency Department (HOSPITAL_COMMUNITY): Payer: BLUE CROSS/BLUE SHIELD

## 2018-05-09 ENCOUNTER — Other Ambulatory Visit: Payer: Self-pay

## 2018-05-09 DIAGNOSIS — I480 Paroxysmal atrial fibrillation: Secondary | ICD-10-CM | POA: Diagnosis present

## 2018-05-09 DIAGNOSIS — Z7901 Long term (current) use of anticoagulants: Secondary | ICD-10-CM

## 2018-05-09 DIAGNOSIS — Z23 Encounter for immunization: Secondary | ICD-10-CM

## 2018-05-09 DIAGNOSIS — I420 Dilated cardiomyopathy: Secondary | ICD-10-CM | POA: Diagnosis present

## 2018-05-09 DIAGNOSIS — Z87891 Personal history of nicotine dependence: Secondary | ICD-10-CM

## 2018-05-09 DIAGNOSIS — K859 Acute pancreatitis without necrosis or infection, unspecified: Secondary | ICD-10-CM

## 2018-05-09 DIAGNOSIS — I483 Typical atrial flutter: Secondary | ICD-10-CM | POA: Diagnosis present

## 2018-05-09 DIAGNOSIS — Z79899 Other long term (current) drug therapy: Secondary | ICD-10-CM

## 2018-05-09 DIAGNOSIS — K760 Fatty (change of) liver, not elsewhere classified: Secondary | ICD-10-CM | POA: Diagnosis present

## 2018-05-09 DIAGNOSIS — Z79891 Long term (current) use of opiate analgesic: Secondary | ICD-10-CM

## 2018-05-09 DIAGNOSIS — K861 Other chronic pancreatitis: Secondary | ICD-10-CM | POA: Diagnosis present

## 2018-05-09 DIAGNOSIS — I110 Hypertensive heart disease with heart failure: Secondary | ICD-10-CM | POA: Diagnosis present

## 2018-05-09 DIAGNOSIS — Z6841 Body Mass Index (BMI) 40.0 and over, adult: Secondary | ICD-10-CM

## 2018-05-09 DIAGNOSIS — Q453 Other congenital malformations of pancreas and pancreatic duct: Secondary | ICD-10-CM

## 2018-05-09 DIAGNOSIS — I5022 Chronic systolic (congestive) heart failure: Secondary | ICD-10-CM | POA: Diagnosis present

## 2018-05-09 DIAGNOSIS — G8929 Other chronic pain: Secondary | ICD-10-CM | POA: Diagnosis present

## 2018-05-09 DIAGNOSIS — I10 Essential (primary) hypertension: Secondary | ICD-10-CM | POA: Diagnosis present

## 2018-05-09 DIAGNOSIS — K863 Pseudocyst of pancreas: Secondary | ICD-10-CM | POA: Diagnosis present

## 2018-05-09 DIAGNOSIS — K8500 Idiopathic acute pancreatitis without necrosis or infection: Secondary | ICD-10-CM | POA: Diagnosis not present

## 2018-05-09 LAB — URINALYSIS, ROUTINE W REFLEX MICROSCOPIC
Bilirubin Urine: NEGATIVE
Glucose, UA: NEGATIVE mg/dL
Ketones, ur: NEGATIVE mg/dL
Leukocytes, UA: NEGATIVE
Nitrite: NEGATIVE
Protein, ur: NEGATIVE mg/dL
Specific Gravity, Urine: 1.021 (ref 1.005–1.030)
pH: 5 (ref 5.0–8.0)

## 2018-05-09 LAB — COMPREHENSIVE METABOLIC PANEL
ALT: 29 U/L (ref 0–44)
AST: 26 U/L (ref 15–41)
Albumin: 4.1 g/dL (ref 3.5–5.0)
Alkaline Phosphatase: 40 U/L (ref 38–126)
Anion gap: 8 (ref 5–15)
BUN: 13 mg/dL (ref 6–20)
CO2: 23 mmol/L (ref 22–32)
Calcium: 9.1 mg/dL (ref 8.9–10.3)
Chloride: 109 mmol/L (ref 98–111)
Creatinine, Ser: 0.81 mg/dL (ref 0.61–1.24)
GFR calc Af Amer: >60 mL/min (ref >60)
GFR calc non Af Amer: >60 mL/min (ref >60)
Glucose, Bld: 126 mg/dL — ABNORMAL HIGH (ref 70–99)
Potassium: 3.8 mmol/L (ref 3.5–5.1)
Sodium: 140 mmol/L (ref 135–145)
Total Bilirubin: 0.5 mg/dL (ref 0.3–1.2)
Total Protein: 7.4 g/dL (ref 6.5–8.1)

## 2018-05-09 LAB — CBC
HCT: 41.5 % (ref 39.0–52.0)
Hemoglobin: 15.3 g/dL (ref 13.0–17.0)
MCH: 32.3 pg (ref 26.0–34.0)
MCHC: 36.9 g/dL — ABNORMAL HIGH (ref 30.0–36.0)
MCV: 87.7 fL (ref 78.0–100.0)
Platelets: 298 10*3/uL (ref 150–400)
RBC: 4.73 MIL/uL (ref 4.22–5.81)
RDW: 12.9 % (ref 11.5–15.5)
WBC: 14.1 10*3/uL — ABNORMAL HIGH (ref 4.0–10.5)

## 2018-05-09 LAB — LIPASE, BLOOD: Lipase: 1554 U/L — ABNORMAL HIGH (ref 11–51)

## 2018-05-09 MED ORDER — SODIUM CHLORIDE 0.9 % IV BOLUS
1000.0000 mL | Freq: Once | INTRAVENOUS | Status: AC
  Administered 2018-05-10: 1000 mL via INTRAVENOUS

## 2018-05-09 MED ORDER — ONDANSETRON HCL 4 MG/2ML IJ SOLN
4.0000 mg | Freq: Once | INTRAMUSCULAR | Status: AC
  Administered 2018-05-10: 4 mg via INTRAVENOUS
  Filled 2018-05-09: qty 2

## 2018-05-09 MED ORDER — FENTANYL CITRATE (PF) 100 MCG/2ML IJ SOLN
50.0000 ug | Freq: Once | INTRAMUSCULAR | Status: AC
  Administered 2018-05-10: 50 ug via INTRAVENOUS
  Filled 2018-05-09: qty 2

## 2018-05-09 NOTE — ED Triage Notes (Signed)
Pt states RUQ abd pain starting Thursday, increased today. Hx of pancreatitis, denies consuming ETOH. Denies N/V/D/ or dysuria. No OTC medication. LBM was today and was soft and loose.

## 2018-05-10 ENCOUNTER — Inpatient Hospital Stay (HOSPITAL_COMMUNITY): Payer: BLUE CROSS/BLUE SHIELD

## 2018-05-10 ENCOUNTER — Encounter (HOSPITAL_COMMUNITY): Payer: Self-pay | Admitting: Internal Medicine

## 2018-05-10 DIAGNOSIS — K760 Fatty (change of) liver, not elsewhere classified: Secondary | ICD-10-CM | POA: Diagnosis present

## 2018-05-10 DIAGNOSIS — I483 Typical atrial flutter: Secondary | ICD-10-CM | POA: Diagnosis present

## 2018-05-10 DIAGNOSIS — K861 Other chronic pancreatitis: Secondary | ICD-10-CM

## 2018-05-10 DIAGNOSIS — Q453 Other congenital malformations of pancreas and pancreatic duct: Secondary | ICD-10-CM | POA: Diagnosis not present

## 2018-05-10 DIAGNOSIS — Z87891 Personal history of nicotine dependence: Secondary | ICD-10-CM | POA: Diagnosis not present

## 2018-05-10 DIAGNOSIS — G8929 Other chronic pain: Secondary | ICD-10-CM | POA: Diagnosis present

## 2018-05-10 DIAGNOSIS — I5022 Chronic systolic (congestive) heart failure: Secondary | ICD-10-CM

## 2018-05-10 DIAGNOSIS — I480 Paroxysmal atrial fibrillation: Secondary | ICD-10-CM | POA: Diagnosis present

## 2018-05-10 DIAGNOSIS — I10 Essential (primary) hypertension: Secondary | ICD-10-CM | POA: Diagnosis not present

## 2018-05-10 DIAGNOSIS — Z79891 Long term (current) use of opiate analgesic: Secondary | ICD-10-CM | POA: Diagnosis not present

## 2018-05-10 DIAGNOSIS — Z79899 Other long term (current) drug therapy: Secondary | ICD-10-CM | POA: Diagnosis not present

## 2018-05-10 DIAGNOSIS — Z6841 Body Mass Index (BMI) 40.0 and over, adult: Secondary | ICD-10-CM | POA: Diagnosis not present

## 2018-05-10 DIAGNOSIS — I420 Dilated cardiomyopathy: Secondary | ICD-10-CM | POA: Diagnosis present

## 2018-05-10 DIAGNOSIS — I110 Hypertensive heart disease with heart failure: Secondary | ICD-10-CM | POA: Diagnosis present

## 2018-05-10 DIAGNOSIS — Z23 Encounter for immunization: Secondary | ICD-10-CM | POA: Diagnosis not present

## 2018-05-10 DIAGNOSIS — K8500 Idiopathic acute pancreatitis without necrosis or infection: Secondary | ICD-10-CM | POA: Diagnosis not present

## 2018-05-10 DIAGNOSIS — K859 Acute pancreatitis without necrosis or infection, unspecified: Principal | ICD-10-CM

## 2018-05-10 DIAGNOSIS — K863 Pseudocyst of pancreas: Secondary | ICD-10-CM

## 2018-05-10 DIAGNOSIS — Z7901 Long term (current) use of anticoagulants: Secondary | ICD-10-CM | POA: Diagnosis not present

## 2018-05-10 LAB — CBC WITH DIFFERENTIAL/PLATELET
Basophils Absolute: 0 10*3/uL (ref 0.0–0.1)
Basophils Relative: 0 %
Eosinophils Absolute: 0.2 10*3/uL (ref 0.0–0.7)
Eosinophils Relative: 2 %
HCT: 39.1 % (ref 39.0–52.0)
Hemoglobin: 12.6 g/dL — ABNORMAL LOW (ref 13.0–17.0)
Lymphocytes Relative: 19 %
Lymphs Abs: 2.3 10*3/uL (ref 0.7–4.0)
MCH: 28.8 pg (ref 26.0–34.0)
MCHC: 32.2 g/dL (ref 30.0–36.0)
MCV: 89.3 fL (ref 78.0–100.0)
Monocytes Absolute: 0.9 10*3/uL (ref 0.1–1.0)
Monocytes Relative: 7 %
Neutro Abs: 8.7 10*3/uL — ABNORMAL HIGH (ref 1.7–7.7)
Neutrophils Relative %: 72 %
Platelets: 266 10*3/uL (ref 150–400)
RBC: 4.38 MIL/uL (ref 4.22–5.81)
RDW: 13.1 % (ref 11.5–15.5)
WBC: 12.1 10*3/uL — ABNORMAL HIGH (ref 4.0–10.5)

## 2018-05-10 LAB — COMPREHENSIVE METABOLIC PANEL
ALT: 24 U/L (ref 0–44)
AST: 19 U/L (ref 15–41)
Albumin: 3.4 g/dL — ABNORMAL LOW (ref 3.5–5.0)
Alkaline Phosphatase: 34 U/L — ABNORMAL LOW (ref 38–126)
Anion gap: 4 — ABNORMAL LOW (ref 5–15)
BUN: 12 mg/dL (ref 6–20)
CO2: 24 mmol/L (ref 22–32)
Calcium: 8.1 mg/dL — ABNORMAL LOW (ref 8.9–10.3)
Chloride: 112 mmol/L — ABNORMAL HIGH (ref 98–111)
Creatinine, Ser: 0.72 mg/dL (ref 0.61–1.24)
GFR calc Af Amer: >60 mL/min (ref >60)
GFR calc non Af Amer: >60 mL/min (ref >60)
Glucose, Bld: 108 mg/dL — ABNORMAL HIGH (ref 70–99)
Potassium: 3.7 mmol/L (ref 3.5–5.1)
Sodium: 140 mmol/L (ref 135–145)
Total Bilirubin: 0.4 mg/dL (ref 0.3–1.2)
Total Protein: 6.2 g/dL — ABNORMAL LOW (ref 6.5–8.1)

## 2018-05-10 LAB — PHOSPHORUS: Phosphorus: 3.8 mg/dL (ref 2.5–4.6)

## 2018-05-10 LAB — LIPASE, BLOOD: Lipase: 335 U/L — ABNORMAL HIGH (ref 11–51)

## 2018-05-10 LAB — ETHANOL: Alcohol, Ethyl (B): <10 mg/dL (ref <10)

## 2018-05-10 LAB — MAGNESIUM: Magnesium: 2.1 mg/dL (ref 1.7–2.4)

## 2018-05-10 MED ORDER — IOPAMIDOL (ISOVUE-300) INJECTION 61%
100.0000 mL | Freq: Once | INTRAVENOUS | Status: AC | PRN
  Administered 2018-05-10: 100 mL via INTRAVENOUS

## 2018-05-10 MED ORDER — HYDROMORPHONE HCL 1 MG/ML IJ SOLN
1.0000 mg | INTRAMUSCULAR | Status: DC | PRN
  Administered 2018-05-10 – 2018-05-12 (×14): 1 mg via INTRAVENOUS
  Filled 2018-05-10 (×14): qty 1

## 2018-05-10 MED ORDER — INFLUENZA VAC SPLIT QUAD 0.5 ML IM SUSY
0.5000 mL | PREFILLED_SYRINGE | INTRAMUSCULAR | Status: AC
  Administered 2018-05-11: 0.5 mL via INTRAMUSCULAR
  Filled 2018-05-10: qty 0.5

## 2018-05-10 MED ORDER — PANCRELIPASE (LIP-PROT-AMYL) 12000-38000 UNITS PO CPEP
36000.0000 [IU] | ORAL_CAPSULE | Freq: Three times a day (TID) | ORAL | Status: DC
  Filled 2018-05-10: qty 3

## 2018-05-10 MED ORDER — METOPROLOL SUCCINATE ER 25 MG PO TB24
25.0000 mg | ORAL_TABLET | Freq: Every evening | ORAL | Status: DC
  Administered 2018-05-10 – 2018-05-11 (×2): 25 mg via ORAL
  Filled 2018-05-10 (×2): qty 1

## 2018-05-10 MED ORDER — FLECAINIDE ACETATE 50 MG PO TABS
100.0000 mg | ORAL_TABLET | Freq: Two times a day (BID) | ORAL | Status: DC
  Administered 2018-05-10 – 2018-05-12 (×5): 100 mg via ORAL
  Filled 2018-05-10 (×5): qty 2

## 2018-05-10 MED ORDER — FENTANYL CITRATE (PF) 100 MCG/2ML IJ SOLN
50.0000 ug | Freq: Once | INTRAMUSCULAR | Status: AC
Start: 2018-05-10 — End: 2018-05-10
  Administered 2018-05-10: 50 ug via INTRAVENOUS
  Filled 2018-05-10: qty 2

## 2018-05-10 MED ORDER — FLECAINIDE ACETATE 50 MG PO TABS: 100.0000 mg | ORAL_TABLET | Freq: Two times a day (BID) | ORAL | Status: DC

## 2018-05-10 MED ORDER — FENTANYL CITRATE (PF) 100 MCG/2ML IJ SOLN
25.0000 ug | Freq: Once | INTRAMUSCULAR | Status: AC
  Administered 2018-05-10: 25 ug via INTRAVENOUS
  Filled 2018-05-10: qty 2

## 2018-05-10 MED ORDER — KETOROLAC TROMETHAMINE 30 MG/ML IJ SOLN
30.0000 mg | Freq: Once | INTRAMUSCULAR | Status: AC
  Administered 2018-05-10: 30 mg via INTRAVENOUS
  Filled 2018-05-10: qty 1

## 2018-05-10 MED ORDER — LISINOPRIL 5 MG PO TABS
5.0000 mg | ORAL_TABLET | Freq: Every evening | ORAL | Status: DC
  Administered 2018-05-10: 5 mg via ORAL
  Filled 2018-05-10 (×2): qty 1

## 2018-05-10 MED ORDER — ONDANSETRON HCL 4 MG PO TABS
4.0000 mg | ORAL_TABLET | Freq: Four times a day (QID) | ORAL | Status: DC | PRN
  Administered 2018-05-12: 4 mg via ORAL
  Filled 2018-05-10: qty 1

## 2018-05-10 MED ORDER — FAMOTIDINE IN NACL 20-0.9 MG/50ML-% IV SOLN
20.0000 mg | Freq: Two times a day (BID) | INTRAVENOUS | Status: DC
  Filled 2018-05-10: qty 50

## 2018-05-10 MED ORDER — FENTANYL CITRATE (PF) 100 MCG/2ML IJ SOLN: 50.0000 ug | INTRAMUSCULAR | Status: DC | PRN

## 2018-05-10 MED ORDER — PROCHLORPERAZINE EDISYLATE 10 MG/2ML IJ SOLN
10.0000 mg | Freq: Once | INTRAMUSCULAR | Status: AC
  Administered 2018-05-10: 10 mg via INTRAVENOUS
  Filled 2018-05-10: qty 2

## 2018-05-10 MED ORDER — PNEUMOCOCCAL VAC POLYVALENT 25 MCG/0.5ML IJ INJ
0.5000 mL | INJECTION | INTRAMUSCULAR | Status: AC
  Administered 2018-05-11: 0.5 mL via INTRAMUSCULAR
  Filled 2018-05-10: qty 0.5

## 2018-05-10 MED ORDER — VITAMIN D 1000 UNITS PO TABS
1000.0000 [IU] | ORAL_TABLET | Freq: Every evening | ORAL | Status: DC
  Administered 2018-05-10 – 2018-05-11 (×2): 1000 [IU] via ORAL
  Filled 2018-05-10 (×2): qty 1

## 2018-05-10 MED ORDER — SODIUM CHLORIDE 0.9 % IV SOLN
INTRAVENOUS | Status: DC
  Administered 2018-05-10 – 2018-05-12 (×7): via INTRAVENOUS

## 2018-05-10 MED ORDER — ONDANSETRON HCL 4 MG/2ML IJ SOLN
4.0000 mg | Freq: Four times a day (QID) | INTRAMUSCULAR | Status: DC | PRN
  Administered 2018-05-11 (×2): 4 mg via INTRAVENOUS
  Filled 2018-05-10 (×2): qty 2

## 2018-05-10 MED ORDER — APIXABAN 5 MG PO TABS
5.0000 mg | ORAL_TABLET | Freq: Two times a day (BID) | ORAL | Status: DC
  Administered 2018-05-10 – 2018-05-12 (×5): 5 mg via ORAL
  Filled 2018-05-10 (×5): qty 1

## 2018-05-10 MED ORDER — PANTOPRAZOLE SODIUM 40 MG PO TBEC
40.0000 mg | DELAYED_RELEASE_TABLET | Freq: Every day | ORAL | Status: DC
  Administered 2018-05-10 – 2018-05-12 (×3): 40 mg via ORAL
  Filled 2018-05-10 (×4): qty 1

## 2018-05-10 MED ORDER — APIXABAN 5 MG PO TABS: 5.0000 mg | ORAL_TABLET | Freq: Two times a day (BID) | ORAL | Status: DC

## 2018-05-10 NOTE — Progress Notes (Addendum)
PROGRESS NOTE  Nathaniel [name] ZOX:[id number] DOB: 12-13-1990 DOA: 05/09/2018 PCP: Toma Deiters, MD  Brief History:  27 year old male with a history of paroxysmal atrial s/p ablation (12/09/16) on apixaban, chronic chronic pancreatitis, systolic CHF and chronic low back pain presenting with 1 week history of epigastric and right upper quadrant abdominal pain associated with nausea.  The patient denied any vomiting.  He denies any alcohol use or new medications.  He denies any illicit drug use.  There is no fevers, chills, chest pain, shortness of breath.  Over this past week prior to admission, his abdominal pain has been colicky in nature, but worsened on the evening of 05/09/2018 which prompted him to go to the emergency department.  In the emergency department, CT of the abdomen and pelvis showed diffuse peripancreatic stranding about the entire pancreas without necrosis.  There was a known chronic pseudocyst which appears to have decreased in size from his last CT.  There is also multiple enlarged mesenteric and peripancreatic lymph nodes.  The splenic, portal, and mesenteric vessels were all patent.  The patient was admitted for acute pancreatitis.  Assessment/Plan: Acute on chronic pancreatitis -remain npo -continue IVF -RUQ ultrasound -CT abd as discussed above -continue creon when able to tolerate po -check lipid panel -check UDS  Paroxysmal atrial fibrillation/ atrial flutter -continue apixaban -s/p ablation -presently in sinus -continue metoprolol succinate  Essential Hypertension -continue metoprolol succinate -holding lisinopril  Morbid Obesity -BMI 46.09 -lifestyle modification  Tachycardia mediated cardiomyopathy -04/03/17 Echo EF 50-55%        Disposition Plan:   Home in 2-3 days  Family Communication:  No Family at bedside--Total time spent 35 minutes.  Greater than 50% spent face to face counseling and coordinating care. 1140 to  1215   Consultants:  GI  Code Status:  FULL   DVT Prophylaxis:  apixaban   Procedures: As Listed in Progress Note Above  Antibiotics: None    Subjective: Patient states that his abdominal pain is somewhat improved but still moderate.  He denies any vomiting, diarrhea, headache, fevers, chills, chest pain, shortness breath.  He still has some nausea.  Objective: Vitals:   05/10/18 0214 05/10/18 0237 05/10/18 0512 05/10/18 1035  BP: (!) 116/53 124/66 (!) 100/47   Pulse: 80 81 65   Resp: 16  18   Temp:  98 F (36.7 C) 98.4 F (36.9 C)   TempSrc:  Oral Oral   SpO2: 100% 100% 97% 98%  Weight:  (!) 149.9 kg    Height:  5\' 11"  (1.803 m)      Intake/Output Summary (Last 24 hours) at 05/10/2018 1201 Last data filed at 05/10/2018 1029 Gross per 24 hour  Intake 1108.31 ml  Output 700 ml  Net 408.31 ml   Weight change:  Exam:   General:  Pt is alert, follows commands appropriately, not in acute distress  HEENT: No icterus, No thrush, No neck mass, Otho/AT  Cardiovascular: RRR, S1/S2, no rubs, no gallops  Respiratory: Bibasilar crackles but no wheeze.  Good air movement  Abdomen: Soft/+BS, epigastric and right upper quadrant tender, non distended, no guarding  Extremities: No edema, No lymphangitis, No petechiae, No rashes, no synovitis   Data Reviewed: I have personally reviewed following labs and imaging studies Basic Metabolic Panel: Recent Labs  Lab 05/09/18 2156 05/10/18 0516  NA 140 140  K 3.8 3.7  CL 109 112*  CO2 23 24  GLUCOSE 126* 108*  BUN 13 12  CREATININE 0.81 0.72  CALCIUM 9.1 8.1*  MG 2.1  --   PHOS 3.8  --    Liver Function Tests: Recent Labs  Lab 05/09/18 2156 05/10/18 0516  AST 26 19  ALT 29 24  ALKPHOS 40 34*  BILITOT 0.5 0.4  PROT 7.4 6.2*  ALBUMIN 4.1 3.4*   Recent Labs  Lab 05/09/18 2156 05/10/18 0516  LIPASE 1,554* 335*   No results for input(s): AMMONIA in the last 168 hours. Coagulation Profile: No results for  input(s): INR, PROTIME in the last 168 hours. CBC: Recent Labs  Lab 05/09/18 2156 05/10/18 0516  WBC 14.1* 12.1*  NEUTROABS  --  8.7*  HGB 15.3 12.6*  HCT 41.5 39.1  MCV 87.7 89.3  PLT 298 266   Cardiac Enzymes: No results for input(s): CKTOTAL, CKMB, CKMBINDEX, TROPONINI in the last 168 hours. BNP: Invalid input(s): POCBNP CBG: No results for input(s): GLUCAP in the last 168 hours. HbA1C: No results for input(s): HGBA1C in the last 72 hours. Urine analysis:    Component Value Date/Time   COLORURINE YELLOW 05/09/2018 2050   APPEARANCEUR CLEAR 05/09/2018 2050   LABSPEC 1.021 05/09/2018 2050   PHURINE 5.0 05/09/2018 2050   GLUCOSEU NEGATIVE 05/09/2018 2050   HGBUR SMALL (A) 05/09/2018 2050   BILIRUBINUR NEGATIVE 05/09/2018 2050   KETONESUR NEGATIVE 05/09/2018 2050   PROTEINUR NEGATIVE 05/09/2018 2050   UROBILINOGEN 1.0 01/14/2014 0032   NITRITE NEGATIVE 05/09/2018 2050   LEUKOCYTESUR NEGATIVE 05/09/2018 2050   Sepsis Labs: @LABRCNTIP (procalcitonin:4,lacticidven:4) )No results found for this or any previous visit (from the past 240 hour(s)).   Scheduled Meds: . apixaban  5 mg Oral BID  . cholecalciferol  1,000 Units Oral QPM  . flecainide  100 mg Oral BID  . [START ON 05/11/2018] Influenza vac split quadrivalent PF  0.5 mL Intramuscular Tomorrow-1000  . lipase/protease/amylase  36,000 Units Oral TID AC  . lisinopril  5 mg Oral QPM  . metoprolol succinate  25 mg Oral QPM  . pantoprazole  40 mg Oral Daily  . [START ON 05/11/2018] pneumococcal 23 valent vaccine  0.5 mL Intramuscular Tomorrow-1000   Continuous Infusions: . sodium chloride 125 mL/hr at 05/10/18 1029    Procedures/Studies: Ct Abdomen Pelvis W Contrast  Result Date: 05/10/2018 CLINICAL DATA:  Right upper quadrant pain. History of pancreatitis with pseudocyst, recurrent pain and elevated lipase. EXAM: CT ABDOMEN AND PELVIS WITH CONTRAST TECHNIQUE: Multidetector CT imaging of the abdomen and pelvis was  performed using the standard protocol following bolus administration of intravenous contrast. CONTRAST:  ISOVUE-300 IOPAMIDOL (ISOVUE-300) INJECTION 61% COMPARISON:  Most recent CT 08/02/2017 FINDINGS: Lower chest: No pleural fluid. No consolidation. Minor atelectasis. Hepatobiliary: No focal hepatic lesion. Gallbladder physiologically distended, no calcified stone. No biliary dilatation. Pancreas: Diffuse peripancreatic fat stranding about the entire pancreas. Small amount of free fluid tracks superiorly. No evidence of pancreatic necrosis. Known chronic pseudocyst ventral to the pancreatic head has decreased in size from prior exam currently measuring 3.7 x 3.6 x 6.7 cm, previously 4.7 x 4.5 x 8 cm. Slight decreased wall thickening. This causes mild mass effect on the stomach. No new peripancreatic fluid collection. No pancreatic ductal dilatation. Spleen: Normal in size without focal abnormality. Adrenals/Urinary Tract: Tiny left adrenal myelolipoma. No suspicious adrenal nodule. No hydronephrosis or perinephric edema. Urinary bladder is minimally distended. Stomach/Bowel: Mild mass effect on the stomach by a pancreatic pseudocyst, no definite associated wall thickening. Stranding about the duodenum tract from pancreatic inflammation.  No bowel obstruction. Normal appendix. Small volume of colonic stool. Vascular/Lymphatic: Multiple enlarged and prominent central mesenteric nodes and peripancreatic nodes, measuring up to 10 mm, many of these have decreased in size from prior exam. Prominent retroperitoneal nodes are also unchanged. No progressive adenopathy. Splenic and portal veins are patent. Mesenteric vessels appear patent. Normal caliber abdominal aorta. Reproductive: Prostate is unremarkable. Other: Peripancreatic edema and minimal fluid. No other ascites. No free air. Small amount of fat in the inguinal canals, right greater than left. Musculoskeletal: There are no acute or suspicious osseous  abnormalities. IMPRESSION: 1. Acute edematous pancreatitis with diffuse peripancreatic edema. No evidence of pancreatic necrosis. 2. 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 No new peripancreatic fluid collection. 3. Mesenteric, peripancreatic and retroperitoneal adenopathy, likely reactive in slightly improved from comparison exam. Electronically Signed   By: Narda Rutherford M.D.   On: 05/10/2018 00:40    Catarina Hartshorn, DO  [facility] Pager [phone]  If 7PM-7AM, please contact night-coverage [email or web address] Password [email or web address] 05/10/2018, 12:01 PM   LOS: 0 days

## 2018-05-10 NOTE — Consult Note (Signed)
Reason for Consult: pancreatitis Referring Physician:  [name] is an 27 y.o. male.  HPI: Admitted thru the ED last night. RUQ pain since last Thursday. Pain worsened yesterday and he presented to the ED. Hx of pancreatitis in past. Has had about 9 bouts of pancreatitis. First episode was in 2012.  He is not on any particular diet.  He takes Creon before meals. He tries to stay on a low sodium diet. Had eaten Hamburger helper before the pain worsened yesterday. Underwent a CT abdomen which revealed 1. Acute edematous pancreatitis with diffuse peripancreatic edema. No evidence of pancreatic necrosis. 2. 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 No new peripancreatic fluid collection. 3. Mesenteric, peripancreatic and retroperitoneal adenopathy, likely reactive in slightly improved from comparison exam. Has been evaluated by Dr. Ardis [name] in 2017. He does not drink etoh. Lipase 335 down from 1554 yesterday.  Has also been evaluated at [facility]. States he is voiding okay. Hx of pancreatitis, CHF, atrial flutter, fatty liver Maintained on Eliquis.  Last admission in December of 2018. Was to be evaluated at [facility] but patient states he was not contacted by [facility].   I/O last 3 completed shifts: In: 1108.3 [I.V.:108.3; IV Piggyback:1000] Out: 300 [Urine:300] No intake/output data recorded.     Past Medical History:  Diagnosis Date  . Atrial flutter ([place]) 01/17/2016   Admx 5/17 with AFlutter with RVR in setting of pancreatitis // CHADS2-VASc=1 (CHF) // Anticoag not started // Amiodarone >> NSR   . Chronic lower back pain   . Chronic systolic CHF (congestive heart failure) ([place])    Echo 01/17/16 - EF 35-40%, diffuse HK, mild LAE   . 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[place]) 02/03/2016   a. EF 35-40% in setting of AFlutter with RVR in 5/17 // probably tachycardia mediated // needs FU  echo 2-3 mos after NSR restored // b. Limited Echo 7/17: EF 45-50%, diff HK, mild LAE  . Depression    "last 2-3 months; never treated for it" (12/09/2016)  . Fatty liver 12/2015   per CT  . Headache    "monthly" (12/09/2016)  . Obesity    BMI 45 12/2015  . Pancreatitis 2012   recurrent acute. Admissions to [facility], 12/2015 admissin to [facility] hospital, ct then shows pseudocyst.   . Pancreatitis 2012 X 2; 2017 X 2  . Paroxysmal atrial fibrillation ([facility])   . Typical atrial flutter ([place]) 06/06/2017    Past Surgical History:  Procedure Laterality Date  . ATRIAL FIBRILLATION ABLATION N/A 12/09/2016   Procedure: Atrial Fibrillation Ablation;  Surgeon: Thompson Grayer, MD;  Location: [place] CV LAB;  Service: Cardiovascular;  Laterality: N/A;  . FRACTURE SURGERY    . HAND SURGERY Right ~ 2008   "had pins in it; pulled them"  . TEE WITHOUT CARDIOVERSION N/A 12/08/2016   Procedure: TRANSESOPHAGEAL ECHOCARDIOGRAM (TEE);  Surgeon: Jerline Pain, MD;  Location: [facility] ENDOSCOPY;  Service: Cardiovascular;  Laterality: N/A;  . TONSILLECTOMY AND ADENOIDECTOMY     "in my teens"    Family History  Problem Relation Age of Onset  . Diabetes Mother     Social History:  reports that he has quit smoking. His smoking use included cigarettes. He smoked 0.50 packs per day. His smokeless tobacco use includes snuff. He reports that he drank alcohol. He reports that he has current or past drug history. Drug: Marijuana.  Allergies: No Known Allergies  Medications: I have  reviewed the patient's current medications.  Results for orders placed or performed during the hospital encounter of 05/09/18 (from the past 48 hour(s))  Urinalysis, Routine w reflex microscopic     Status: Abnormal   Collection Time: 05/09/18  8:50 PM  Result Value Ref Range   Color, Urine YELLOW YELLOW   APPearance CLEAR CLEAR   Specific Gravity, Urine 1.021 1.005 - 1.030   pH 5.0 5.0 - 8.0   Glucose, UA NEGATIVE NEGATIVE mg/dL   Hgb urine  dipstick SMALL (A) NEGATIVE   Bilirubin Urine NEGATIVE NEGATIVE   Ketones, ur NEGATIVE NEGATIVE mg/dL   Protein, ur NEGATIVE NEGATIVE mg/dL   Nitrite NEGATIVE NEGATIVE   Leukocytes, UA NEGATIVE NEGATIVE   RBC / HPF 0-5 0 - 5 RBC/hpf   WBC, UA 6-10 0 - 5 WBC/hpf   Bacteria, UA RARE (A) NONE SEEN   Squamous Epithelial / LPF 0-5 0 - 5   Mucus PRESENT     Comment: Performed at [facility], [address] [place], [place] [address]  Lipase, blood     Status: Abnormal   Collection Time: 05/09/18  9:56 PM  Result Value Ref Range   Lipase 1,554 (H) 11 - 51 U/L    Comment: RESULTS CONFIRMED BY MANUAL DILUTION Performed at [facility], [address] [place], [place] [address]   Comprehensive metabolic panel     Status: Abnormal   Collection Time: 05/09/18  9:56 PM  Result Value Ref Range   Sodium 140 135 - 145 mmol/L   Potassium 3.8 3.5 - 5.1 mmol/L   Chloride 109 98 - 111 mmol/L   CO2 23 22 - 32 mmol/L   Glucose, Bld 126 (H) 70 - 99 mg/dL   BUN 13 6 - 20 mg/dL   Creatinine, Ser 0.81 0.61 - 1.24 mg/dL   Calcium 9.1 8.9 - 10.3 mg/dL   Total Protein 7.4 6.5 - 8.1 g/dL   Albumin 4.1 3.5 - 5.0 g/dL   AST 26 15 - 41 U/L   ALT 29 0 - 44 U/L   Alkaline Phosphatase 40 38 - 126 U/L   Total Bilirubin 0.5 0.3 - 1.2 mg/dL   GFR calc non Af Amer >60 >60 mL/min   GFR calc Af Amer >60 >60 mL/min    Comment: (NOTE) The eGFR has been calculated using the CKD EPI equation. This calculation has not been validated in all clinical situations. eGFR's persistently <60 mL/min signify possible Chronic Kidney Disease.    Anion gap 8 5 - 15    Comment: Performed at [facility], [address], [place], [place] [address]  CBC     Status: Abnormal   Collection Time: 05/09/18  9:56 PM  Result Value Ref Range   WBC 14.1 (H) 4.0 - 10.5 K/uL   RBC 4.73 4.22 - 5.81 MIL/uL   Hemoglobin 15.3 13.0 - 17.0 g/dL   HCT 41.5 39.0 - 52.0 %   MCV 87.7 78.0 - 100.0 fL   MCH 32.3 26.0 - 34.0 pg   MCHC 36.9 (H)  30.0 - 36.0 g/dL   RDW 12.9 11.5 - 15.5 %   Platelets 298 150 - 400 K/uL    Comment: Performed at [facility], [address] [place],  [address]  Ethanol     Status: None   Collection Time: 05/09/18  9:56 PM  Result Value Ref Range   Alcohol, Ethyl (B) <10 <10 mg/dL    Comment: Performed at [facility], [place]  [address], [place], [place] [address]  Magnesium     Status: None   Collection Time: 05/09/18  9:56 PM  Result Value Ref Range   Magnesium 2.1 1.7 - 2.4 mg/dL    Comment: Performed at [facility], [address], [place], [place] [address]  Phosphorus     Status: None   Collection Time: 05/09/18  9:56 PM  Result Value Ref Range   Phosphorus 3.8 2.5 - 4.6 mg/dL    Comment: Performed at [facility], [address], [place], [place] [address]  CBC WITH DIFFERENTIAL     Status: Abnormal   Collection Time: 05/10/18  5:16 AM  Result Value Ref Range   WBC 12.1 (H) 4.0 - 10.5 K/uL   RBC 4.38 4.22 - 5.81 MIL/uL   Hemoglobin 12.6 (L) 13.0 - 17.0 g/dL   HCT 39.1 39.0 - 52.0 %   MCV 89.3 78.0 - 100.0 fL   MCH 28.8 26.0 - 34.0 pg   MCHC 32.2 30.0 - 36.0 g/dL   RDW 13.1 11.5 - 15.5 %   Platelets 266 150 - 400 K/uL   Neutrophils Relative % 72 %   Neutro Abs 8.7 (H) 1.7 - 7.7 K/uL   Lymphocytes Relative 19 %   Lymphs Abs 2.3 0.7 - 4.0 K/uL   Monocytes Relative 7 %   Monocytes Absolute 0.9 0.1 - 1.0 K/uL   Eosinophils Relative 2 %   Eosinophils Absolute 0.2 0.0 - 0.7 K/uL   Basophils Relative 0 %   Basophils Absolute 0.0 0.0 - 0.1 K/uL    Comment: Performed at [facility], [address], [place], [place] [address]  Comprehensive metabolic panel     Status: Abnormal   Collection Time: 05/10/18  5:16 AM  Result Value Ref Range   Sodium 140 135 - 145 mmol/L   Potassium 3.7 3.5 - 5.1 mmol/L   Chloride 112 (H) 98 - 111 mmol/L   CO2 24 22 - 32 mmol/L   Glucose, Bld 108 (H) 70 - 99 mg/dL   BUN 12 6 - 20 mg/dL   Creatinine, Ser 0.72 0.61 - 1.24 mg/dL   Calcium 8.1 (L) 8.9 -  10.3 mg/dL   Total Protein 6.2 (L) 6.5 - 8.1 g/dL   Albumin 3.4 (L) 3.5 - 5.0 g/dL   AST 19 15 - 41 U/L   ALT 24 0 - 44 U/L   Alkaline Phosphatase 34 (L) 38 - 126 U/L   Total Bilirubin 0.4 0.3 - 1.2 mg/dL   GFR calc non Af Amer >60 >60 mL/min   GFR calc Af Amer >60 >60 mL/min    Comment: (NOTE) The eGFR has been calculated using the CKD EPI equation. This calculation has not been validated in all clinical situations. eGFR's persistently <60 mL/min signify possible Chronic Kidney Disease.    Anion gap 4 (L) 5 - 15    Comment: Performed at [facility], [address] [place], [place] [address]  Lipase, blood     Status: Abnormal   Collection Time: 05/10/18  5:16 AM  Result Value Ref Range   Lipase 335 (H) 11 - 51 U/L    Comment: Performed at [facility], [address], [place], [place] [address]    Ct Abdomen Pelvis W Contrast  Result Date: 05/10/2018 CLINICAL DATA:  Right upper quadrant pain. History of pancreatitis with pseudocyst, recurrent pain and elevated lipase. EXAM: CT ABDOMEN AND PELVIS WITH CONTRAST TECHNIQUE: Multidetector CT imaging of the abdomen and pelvis was performed using  the standard protocol following bolus administration of intravenous contrast. CONTRAST:  149m ISOVUE-300 IOPAMIDOL (ISOVUE-300) INJECTION 61% COMPARISON:  Most recent CT 08/02/2017 FINDINGS: Lower chest: No pleural fluid. No consolidation. Minor atelectasis. Hepatobiliary: No focal hepatic lesion. Gallbladder physiologically distended, no calcified stone. No biliary dilatation. Pancreas: Diffuse peripancreatic fat stranding about the entire pancreas. Small amount of free fluid tracks superiorly. No evidence of pancreatic necrosis. Known chronic pseudocyst ventral to the pancreatic head has decreased in size from prior exam currently measuring 3.7 x 3.6 x 6.7 cm, previously 4.7 x 4.5 x 8 cm. Slight decreased wall thickening. This causes mild mass effect on the stomach. No new peripancreatic fluid  collection. No pancreatic ductal dilatation. Spleen: Normal in size without focal abnormality. Adrenals/Urinary Tract: Tiny left adrenal myelolipoma. No suspicious adrenal nodule. No hydronephrosis or perinephric edema. Urinary bladder is minimally distended. Stomach/Bowel: Mild mass effect on the stomach by a pancreatic pseudocyst, no definite associated wall thickening. Stranding about the duodenum tract from pancreatic inflammation. No bowel obstruction. Normal appendix. Small volume of colonic stool. Vascular/Lymphatic: Multiple enlarged and prominent central mesenteric nodes and peripancreatic nodes, measuring up to 10 mm, many of these have decreased in size from prior exam. Prominent retroperitoneal nodes are also unchanged. No progressive adenopathy. Splenic and portal veins are patent. Mesenteric vessels appear patent. Normal caliber abdominal aorta. Reproductive: Prostate is unremarkable. Other: Peripancreatic edema and minimal fluid. No other ascites. No free air. Small amount of fat in the inguinal canals, right greater than left. Musculoskeletal: There are no acute or suspicious osseous abnormalities. IMPRESSION: 1. Acute edematous pancreatitis with diffuse peripancreatic edema. No evidence of pancreatic necrosis. 2. 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 No new peripancreatic fluid collection. 3. Mesenteric, peripancreatic and retroperitoneal adenopathy, likely reactive in slightly improved from comparison exam. Electronically Signed   By: MKeith RakeM.D.   On: 05/10/2018 00:40    ROS Blood pressure (!) 100/47, pulse 65, temperature 98.4 F (36.9 C), temperature source Oral, resp. rate 18, height '5\' 11"'$  (1.803 m), weight (!) 149.9 kg, SpO2 97 %. Physical Exam Alert and oriented. Skin warm and dry. Oral mucosa is moist.   . Sclera anicteric, conjunctivae is pink. Thyroid not enlarged. No  cervical lymphadenopathy. Lungs clear. Heart regular rate and rhythm.  Abdomen is soft. Bowel sounds are positive. No hepatomegaly. No abdominal masses felt.RUQ tenderness.  No edema to lower extremities.     Assessment/Plan: Acute pancreatitis. [place] with Creon. Agree with NPO status for now. Will discuss with Dr. RCherylynn Ridges9/06/2018, 8:21 AM

## 2018-05-10 NOTE — ED Provider Notes (Signed)
[facility] EMERGENCY DEPARTMENT Provider Note   CSN: [id number] Arrival date & time: 05/09/18  2032  Time seen 23:25 PM    History   Chief Complaint Chief Complaint  Patient presents with  . Abdominal Pain    HPI Nathaniel [name] is a 27 y.o. male.  HPI patient has a history of pancreatitis.  His last admission was in December 2018.  At that time they discussed with him he needed to have a stent placed however he would need to have it done at [facility].  He states he never heard from [facility] however when I read the note he was supposed to follow-up with gastroenterology locally and they would arrange the referral.  He reports on September 5 he started having some right upper quadrant abdominal discomfort that would come and go and only last a few minutes.  He states this is not like his usual pancreatitis.  He states tonight at 7 PM he went to work and he had acute onset of severe constant worsening right upper quadrant pain.  He states usually his pain is more in the epigastric area.  He describes the pain as sharp dull achy and pressure sensation.  He states it does radiate into his back.  He has had some mild nausea without vomiting or diarrhea.  He denies any fever.  He has not associated any relation of the pain to eating.  He denies alcohol use since October 2018.  He states he was seen by [facility] couple years ago otherwise he does not have a regular gastroenterologist.  Patient has a history of atrial fib/flutter and also congestive heart failure.  His cardiologist is Dr. Johney Frame.  PCP Toma Deiters, MD   Past Medical History:  Diagnosis Date  . Atrial flutter (HCC) 01/17/2016   Admx 5/17 with AFlutter with RVR in setting of pancreatitis // CHADS2-VASc=1 (CHF) // Anticoag not started // Amiodarone >> NSR   . Chronic lower back pain   . Chronic systolic CHF (congestive heart failure) (HCC)    Echo 01/17/16 - EF 35-40%, diffuse HK, mild LAE   . DCM (dilated  cardiomyopathy) (HCC) 02/03/2016   a. EF 35-40% in setting of AFlutter with RVR in 5/17 // probably tachycardia mediated // needs FU echo 2-3 mos after NSR restored // b. Limited Echo 7/17: EF 45-50%, diff HK, mild LAE  . Depression    "last 2-3 months; never treated for it" (12/09/2016)  . Fatty liver 12/2015   per CT  . Headache    "monthly" (12/09/2016)  . Obesity    BMI 45 12/2015  . Pancreatitis 2012   recurrent acute. Admissions to [facility], 12/2015 admissin to [facility] hospital, ct then shows pseudocyst.   . Pancreatitis 2012 X 2; 2017 X 2  . Paroxysmal atrial fibrillation (HCC)   . Typical atrial flutter (HCC) 06/06/2017    Patient Active Problem List   Diagnosis Date Noted  . Acute pancreatitis 05/10/2018  . Hypertension 05/10/2018  . Acute on chronic pancreatitis (HCC) 08/06/2017  . Obesity, Class III, BMI 40-49.9 (morbid obesity) (HCC) 08/06/2017  . Gastroenteritis 06/05/2017  . History of cardiomyopathy 06/05/2017  . Hypokalemia 06/05/2017  . Hypomagnesemia 06/05/2017  . Paroxysmal atrial fibrillation (HCC) 12/09/2016  . A-fib (HCC) 12/09/2016  . Pancreatitis 03/05/2016  . Acute pancreatitis without infection or necrosis 03/05/2016  . 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HCC) 02/03/2016  . Chronic systolic CHF (congestive heart failure) (HCC)   . Pancreatic pseudocyst   . Morbid  obesity due to excess calories (HCC)   . Dyslipidemia   . History of pancreatitis 01/17/2016  . Snoring 01/17/2016  . Former smoker 01/17/2016  . 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 Atrial flutter (HCC) 01/17/2016    Past Surgical History:  Procedure Laterality Date  . ATRIAL FIBRILLATION ABLATION N/A 12/09/2016   Procedure: Atrial Fibrillation Ablation;  Surgeon: Hillis Range, MD;  Location: [facility] INVASIVE CV LAB;  Service: Cardiovascular;  Laterality: N/A;  . FRACTURE SURGERY    . HAND SURGERY Right ~ 2008   "had pins in it; pulled them"  . TEE WITHOUT CARDIOVERSION N/A 12/08/2016   Procedure:  TRANSESOPHAGEAL ECHOCARDIOGRAM (TEE);  Surgeon: Jake Bathe, MD;  Location: [facility] ENDOSCOPY;  Service: Cardiovascular;  Laterality: N/A;  . TONSILLECTOMY AND ADENOIDECTOMY     "in my teens"        Home Medications    Prior to Admission medications   Medication Sig Start Date End Date Taking? Authorizing Provider  Cholecalciferol (VITAMIN D3) 1000 units CAPS Take 1,000 Units by mouth every evening.   Yes [provider]  ELIQUIS 5 MG TABS tablet TAKE 1 TABLET BY MOUTH TWICE DAILY Patient taking differently: Take 5 mg by mouth 2 (two) times daily.  12/05/17  Yes Allred, Fayrene Fearing, MD  flecainide (TAMBOCOR) 100 MG tablet Take 1 tablet (100 mg total) by mouth 2 (two) times daily. 01/09/18  Yes Allred, Fayrene Fearing, MD  HYDROcodone-acetaminophen (NORCO/VICODIN) 5-325 MG tablet Take 2 tablets by mouth every 6 (six) hours as needed. Patient taking differently: Take 2 tablets by mouth every 6 (six) hours as needed for moderate pain.  08/06/17  Yes Dhungel, Nishant, MD  lipase/protease/amylase (CREON) 36000 UNITS CPEP capsule Take 1 capsule (36,000 Units total) by mouth 3 (three) times daily before meals. 08/06/17  Yes Dhungel, Nishant, MD  lisinopril (PRINIVIL,ZESTRIL) 5 MG tablet TAKE 1 TABLET(5 MG) BY MOUTH DAILY Patient taking differently: Take 5 mg by mouth every evening.  04/03/18  Yes Allred, Fayrene Fearing, MD  metoprolol succinate (TOPROL-XL) 25 MG 24 hr tablet TAKE 1 TABLET(25 MG) BY MOUTH DAILY Patient taking differently: Take 25 mg by mouth every evening.  04/10/18  Yes Allred, Fayrene Fearing, MD  triamcinolone cream (KENALOG) 0.1 % Apply 1 application topically daily as needed (for irritation).    Yes [provider]    Family History Family History  Problem Relation Age of Onset  . Diabetes Mother     Social History Social History   Tobacco Use  . Smoking status: Former Smoker    Packs/day: 0.50    Types: Cigarettes  . Smokeless tobacco: Current User    Types: Snuff  Substance Use Topics    . Alcohol use: Not Currently    Comment: occasional  . Drug use: Not Currently    Types: Marijuana    Comment: 12/09/2016 "daily from age 28 to 89"  employed Lives with spouse  Allergies   Patient has no known allergies.   Review of Systems Review of Systems  All other systems reviewed and are negative.    Physical Exam Updated Vital Signs BP (!) 150/70 (BP Location: Right Arm)   Pulse 74   Temp 97.8 F (36.6 C) (Oral)   Resp 18   Ht 5\' 11"  (1.803 m)   Wt (!) 149.7 kg   SpO2 99%   BMI 46.03 kg/m   Vital signs normal except for hypertension   Physical Exam  Constitutional: He is oriented to person, place, and time. He appears  well-developed and well-nourished.  Non-toxic appearance. He does not appear ill. He appears distressed.  obese  HENT:  Head: Normocephalic and atraumatic.  Right Ear: External ear normal.  Left Ear: External ear normal.  Nose: Nose normal. No mucosal edema or rhinorrhea.  Mouth/Throat: Oropharynx is clear and moist and mucous membranes are normal. No dental abscesses or uvula swelling.  Eyes: Pupils are equal, round, and reactive to light. Conjunctivae and EOM are normal.  Neck: Normal range of motion and full passive range of motion without pain. Neck supple.  Cardiovascular: Normal rate, regular rhythm and normal heart sounds. Exam reveals no gallop and no friction rub.  No murmur heard. Pulmonary/Chest: Effort normal and breath sounds normal. No respiratory distress. He has no wheezes. He has no rhonchi. He has no rales. He exhibits no tenderness and no crepitus.  Abdominal: Soft. Normal appearance and bowel sounds are normal. He exhibits no distension. There is tenderness in the right upper quadrant, epigastric area and left upper quadrant. There is no rebound and no guarding.    Patient has tenderness in a well localized area in his right upper quadrant, he is less tender in the epigastric area and then even less in the left upper  quadrant.  Musculoskeletal: Normal range of motion. He exhibits no edema or tenderness.  Moves all extremities well.   Neurological: He is alert and oriented to person, place, and time. He has normal strength. No cranial nerve deficit.  Skin: Skin is warm, dry and intact. No rash noted. No erythema. No pallor.  Psychiatric: He has a normal mood and affect. His speech is normal and behavior is normal. His mood appears not anxious.  Nursing note and vitals reviewed.    ED Treatments / Results  Labs (all labs ordered are listed, but only abnormal results are displayed) Results for orders placed or performed during the hospital encounter of 05/09/18  Lipase, blood  Result Value Ref Range   Lipase 1,554 (H) 11 - 51 U/L  Comprehensive metabolic panel  Result Value Ref Range   Sodium 140 135 - 145 mmol/L   Potassium 3.8 3.5 - 5.1 mmol/L   Chloride 109 98 - 111 mmol/L   CO2 23 22 - 32 mmol/L   Glucose, Bld 126 (H) 70 - 99 mg/dL   BUN 13 6 - 20 mg/dL   Creatinine, Ser 1.19 0.61 - 1.24 mg/dL   Calcium 9.1 8.9 - 14.7 mg/dL   Total Protein 7.4 6.5 - 8.1 g/dL   Albumin 4.1 3.5 - 5.0 g/dL   AST 26 15 - 41 U/L   ALT 29 0 - 44 U/L   Alkaline Phosphatase 40 38 - 126 U/L   Total Bilirubin 0.5 0.3 - 1.2 mg/dL   GFR calc non Af Amer >60 >60 mL/min   GFR calc Af Amer >60 >60 mL/min   Anion gap 8 5 - 15  CBC  Result Value Ref Range   WBC 14.1 (H) 4.0 - 10.5 K/uL   RBC 4.73 4.22 - 5.81 MIL/uL   Hemoglobin 15.3 13.0 - 17.0 g/dL   HCT 82.9 56.2 - 13.0 %   MCV 87.7 78.0 - 100.0 fL   MCH 32.3 26.0 - 34.0 pg   MCHC 36.9 (H) 30.0 - 36.0 g/dL   RDW 86.5 78.4 - 69.6 %   Platelets 298 150 - 400 K/uL  Urinalysis, Routine w reflex microscopic  Result Value Ref Range   Color, Urine YELLOW YELLOW   APPearance CLEAR CLEAR  Specific Gravity, Urine 1.021 1.005 - 1.030   pH 5.0 5.0 - 8.0   Glucose, UA NEGATIVE NEGATIVE mg/dL   Hgb urine dipstick SMALL (A) NEGATIVE   Bilirubin Urine NEGATIVE NEGATIVE     Ketones, ur NEGATIVE NEGATIVE mg/dL   Protein, ur NEGATIVE NEGATIVE mg/dL   Nitrite NEGATIVE NEGATIVE   Leukocytes, UA NEGATIVE NEGATIVE   RBC / HPF 0-5 0 - 5 RBC/hpf   WBC, UA 6-10 0 - 5 WBC/hpf   Bacteria, UA RARE (A) NONE SEEN   Squamous Epithelial / LPF 0-5 0 - 5   Mucus PRESENT   Ethanol  Result Value Ref Range   Alcohol, Ethyl (B) <10 <10 mg/dL  Magnesium  Result Value Ref Range   Magnesium 2.1 1.7 - 2.4 mg/dL  Phosphorus  Result Value Ref Range   Phosphorus 3.8 2.5 - 4.6 mg/dL   Laboratory interpretation all normal except very elevated lipase, leukocytosis    EKG None  Radiology Ct Abdomen Pelvis W Contrast  Result Date: 05/10/2018 CLINICAL DATA:  Right upper quadrant pain. History of pancreatitis with pseudocyst, recurrent pain and elevated lipase. EXAM: CT ABDOMEN AND PELVIS WITH CONTRAST TECHNIQUE: Multidetector CT imaging of the abdomen and pelvis was performed using the standard protocol following bolus administration of intravenous contrast. CONTRAST:  ISOVUE-300 IOPAMIDOL (ISOVUE-300) INJECTION 61% COMPARISON:  Most recent CT 08/02/2017 FINDINGS: Lower chest: No pleural fluid. No consolidation. Minor atelectasis. Hepatobiliary: No focal hepatic lesion. Gallbladder physiologically distended, no calcified stone. No biliary dilatation. Pancreas: Diffuse peripancreatic fat stranding about the entire pancreas. Small amount of free fluid tracks superiorly. No evidence of pancreatic necrosis. Known chronic pseudocyst ventral to the pancreatic head has decreased in size from prior exam currently measuring 3.7 x 3.6 x 6.7 cm, previously 4.7 x 4.5 x 8 cm. Slight decreased wall thickening. This causes mild mass effect on the stomach. No new peripancreatic fluid collection. No pancreatic ductal dilatation. Spleen: Normal in size without focal abnormality. Adrenals/Urinary Tract: Tiny left adrenal myelolipoma. No suspicious adrenal nodule. No hydronephrosis or perinephric  edema. Urinary bladder is minimally distended. Stomach/Bowel: Mild mass effect on the stomach by a pancreatic pseudocyst, no definite associated wall thickening. Stranding about the duodenum tract from pancreatic inflammation. No bowel obstruction. Normal appendix. Small volume of colonic stool. Vascular/Lymphatic: Multiple enlarged and prominent central mesenteric nodes and peripancreatic nodes, measuring up to 10 mm, many of these have decreased in size from prior exam. Prominent retroperitoneal nodes are also unchanged. No progressive adenopathy. Splenic and portal veins are patent. Mesenteric vessels appear patent. Normal caliber abdominal aorta. Reproductive: Prostate is unremarkable. Other: Peripancreatic edema and minimal fluid. No other ascites. No free air. Small amount of fat in the inguinal canals, right greater than left. Musculoskeletal: There are no acute or suspicious osseous abnormalities. IMPRESSION: 1. Acute edematous pancreatitis with diffuse peripancreatic edema. No evidence of pancreatic necrosis. 2. 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 No new peripancreatic fluid collection. 3. Mesenteric, peripancreatic and retroperitoneal adenopathy, likely reactive in slightly improved from comparison exam. Electronically Signed   By: Narda Rutherford M.D.   On: 05/10/2018 00:40    Procedures Procedures (including critical care time)  Medications Ordered in ED Medications  fentaNYL (SUBLIMAZE) injection 50 mcg (has no administration in time range)  0.9 %  sodium chloride infusion ( Intravenous New Bag/Given 05/10/18 0248)  sodium chloride 0.9 % bolus 1,000 mL (1,000 mLs Intravenous New Bag/Given  05/10/18 0009)  fentaNYL (SUBLIMAZE) injection 50 mcg (50 mcg Intravenous Given 05/10/18 0010)  ondansetron (ZOFRAN) injection 4 mg (4 mg Intravenous Given 05/10/18 0010)  iopamidol (ISOVUE-300) 61 %  injection 100 mL (100 mLs Intravenous Contrast Given 05/10/18 0017)  fentaNYL (SUBLIMAZE) injection 50 mcg (50 mcg Intravenous Given 05/10/18 0143)  ketorolac (TORADOL) 30 MG/ML injection 30 mg (30 mg Intravenous Given 05/10/18 0249)     Initial Impression / Assessment and Plan / ED Course  I have reviewed the triage vital signs and the nursing notes.  Pertinent labs & imaging results that were available during my care of the patient were reviewed by me and considered in my medical decision making (see chart for details).    When I went in to talk to patient we discussed his very elevated lipase.  IV fluids were ordered and he was given IV pain and nausea medication.  CT of the abdomen/pelvis was done because that is the only test available to me tonight.  Patient is a history of a prior pseudocyst.  I discussed patient CT result which shows an acute inflammatory changes of his pancreas, however his pseudocyst is slightly smaller.  We discussed reasons for recurrent pancreatitis.  He states he quit drinking.  I do not see where he is ever had a ultrasound of his gallbladder and his pain tonight is more in the location over the gallbladder although it was not described on the CT scan.  He is agreeable for admission which will help him heal better if he has bowel rest.  Hopefully this time with he will be able to get a referral to [facility] for a stent.  1:52 AM Dr. Robb Matar, hospitalist, will admit.   Final Clinical Impressions(s) / ED Diagnoses   Final diagnoses:  Acute pancreatitis without infection or necrosis, unspecified pancreatitis type    Plan admission   Devoria Albe, MD 05/10/18 [phone]

## 2018-05-10 NOTE — H&P (Addendum)
History and Physical    Nathaniel [name] NWG:[id number] DOB: Nov 28, 1990 DOA: 05/09/2018  PCP: Toma Deiters, MD   Patient coming from: Home.  I have personally briefly reviewed patient's old medical records in [facility]  Chief Complaint: Abdominal pain.  HPI: Nathaniel [name] is a 27 y.o. male with medical history significant of atrial flutter, chronic lower back pain, previous systolic heart failure (normal EF on last echo), depression, fatty liver disease, morbid obesity, chronic recurrent pancreatitis, paroxysmal atrial fibrillation, typical atrial flutter who is coming to the emergency department due to abdominal pain associated with nausea that started around 1900 shortly after he arrived to work.  He mentioned that he had toast and hamburger helper for dinner before going to work.  He had an episode of loose stool BM earlier in the evening.  He denies emesis, melena, hematochezia or constipation.  He denies dysuria, frequency or hematuria.  He denies fever, chills, sore throat, wheezing, hemoptysis, chest pain, palpitations, dizziness, diaphoresis, PND, orthopnea or pitting edema of the lower extremities.  No heat or cold intolerance.  Denies polyuria, polydipsia, polyphagia or blurred vision.  ED Course: Initial vital signs temperature 97.8 F, pulse 74, respirations 18, blood pressure 150/70 mmHg and O2 sat 99% on room air.  The patient received a 2000 mL normal saline bolus, 2 doses of fentanyl 50 mcg IVP at 2345 and 0145, ondansetron 4 mg IVP x1 and I added Toradol 30 mg IVP x1.  His work-up shows a urinalysis with small hemoglobinuria and rare bacteria otherwise is normal.  His white count was 14.1, hemoglobin 15.3 g/dL and platelets 578.  His CMP shows a glucose level of 126 mg/dL, but all other values are normal.  Alcohol was less than 10, phosphorus 3.8 magnesium 2.1 mg/dL.  His lipase level was 1554 U/L.  His lipid panel has been done twice in the past and has shown normal  triglycerides and LDL with decreased HDL.  Imaging: CT abdomen/pelvis with contrast shows acute edematous pancreatitis with diffuse peripancreatic edema, but no evidence of pancreatic necrosis.  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  There is no new peripancreatic fluid collection.  There are mesenteric, peripancreatic and retroperitoneal adenopathies, likely reactive and slightly improved from comparison exam.  Please see images and full radiology report for further detail.  Review of Systems: As per HPI otherwise 10 point review of systems negative.   Past Medical History:  Diagnosis Date  . Atrial flutter (HCC) 01/17/2016   Admx 5/17 with AFlutter with RVR in setting of pancreatitis // CHADS2-VASc=1 (CHF) // Anticoag not started // Amiodarone >> NSR   . Chronic lower back pain   . Chronic systolic CHF (congestive heart failure) (HCC)    Echo 01/17/16 - EF 35-40%, diffuse HK, mild LAE   . 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HCC) 02/03/2016   a. EF 35-40% in setting of AFlutter with RVR in 5/17 // probably tachycardia mediated // needs FU echo 2-3 mos after NSR restored // b. Limited Echo 7/17: EF 45-50%, diff HK, mild LAE  . Depression    "last 2-3 months; never treated for it" (12/09/2016)  . Fatty liver 12/2015   per CT  . Headache    "monthly" (12/09/2016)  . Obesity    BMI 45 12/2015  . Pancreatitis 2012   recurrent acute. Admissions to [facility], 12/2015 admissin to [facility], ct then shows pseudocyst.   .  Pancreatitis 2012 X 2; 2017 X 2  . Paroxysmal atrial fibrillation (HCC)   . Typical atrial flutter (HCC) 06/06/2017    Past Surgical History:  Procedure Laterality Date  . ATRIAL FIBRILLATION ABLATION N/A 12/09/2016   Procedure: Atrial Fibrillation Ablation;  Surgeon: Hillis Range, MD;  Location: [facility] INVASIVE CV LAB;  Service: Cardiovascular;  Laterality: N/A;  . FRACTURE SURGERY     . HAND SURGERY Right ~ 2008   "had pins in it; pulled them"  . TEE WITHOUT CARDIOVERSION N/A 12/08/2016   Procedure: TRANSESOPHAGEAL ECHOCARDIOGRAM (TEE);  Surgeon: Jake Bathe, MD;  Location: [facility] ENDOSCOPY;  Service: Cardiovascular;  Laterality: N/A;  . TONSILLECTOMY AND ADENOIDECTOMY     "in my teens"     reports that he has quit smoking. His smoking use included cigarettes. He smoked 0.50 packs per day. His smokeless tobacco use includes snuff. He reports that he drank alcohol. He reports that he has current or past drug history. Drug: Marijuana.  No Known Allergies  Family History  Problem Relation Age of Onset  . Diabetes Mother     Prior to Admission medications   Medication Sig Start Date End Date Taking? Authorizing Provider  Cholecalciferol (VITAMIN D3) 1000 units CAPS Take 1,000 Units by mouth every evening.   Yes [provider]  ELIQUIS 5 MG TABS tablet TAKE 1 TABLET BY MOUTH TWICE DAILY Patient taking differently: Take 5 mg by mouth 2 (two) times daily.  12/05/17  Yes Allred, Fayrene Fearing, MD  flecainide (TAMBOCOR) 100 MG tablet Take 1 tablet (100 mg total) by mouth 2 (two) times daily. 01/09/18  Yes Allred, Fayrene Fearing, MD  HYDROcodone-acetaminophen (NORCO/VICODIN) 5-325 MG tablet Take 2 tablets by mouth every 6 (six) hours as needed. Patient taking differently: Take 2 tablets by mouth every 6 (six) hours as needed for moderate pain.  08/06/17  Yes Dhungel, Nishant, MD  lipase/protease/amylase (CREON) 36000 UNITS CPEP capsule Take 1 capsule (36,000 Units total) by mouth 3 (three) times daily before meals. 08/06/17  Yes Dhungel, Nishant, MD  lisinopril (PRINIVIL,ZESTRIL) 5 MG tablet TAKE 1 TABLET(5 MG) BY MOUTH DAILY Patient taking differently: Take 5 mg by mouth every evening.  04/03/18  Yes Allred, Fayrene Fearing, MD  metoprolol succinate (TOPROL-XL) 25 MG 24 hr tablet TAKE 1 TABLET(25 MG) BY MOUTH DAILY Patient taking differently: Take 25 mg by mouth every evening.  04/10/18  Yes Allred,  Fayrene Fearing, MD  triamcinolone cream (KENALOG) 0.1 % Apply 1 application topically daily as needed (for irritation).    Yes [provider]    Physical Exam: Vitals:   05/09/18 2045 05/09/18 2047  BP: (!) 150/70   Pulse: 74   Resp: 18   Temp: 97.8 F (36.6 C)   TempSrc: Oral   SpO2: 99%   Weight:  (!) 149.7 kg  Height:  5\' 11"  (1.803 m)    Constitutional: NAD, calm, comfortable Eyes: PERRL, lids and conjunctivae normal.  No icterus. ENMT: Mucous membranes are moist. Posterior pharynx clear of any exudate or lesions. Neck: normal, supple, no masses, no thyromegaly Respiratory: clear to auscultation bilaterally, no wheezing, no crackles. Normal respiratory effort. No accessory muscle use.  Cardiovascular: Regular rate and rhythm, no murmurs / rubs / gallops. No extremity edema. 2+ pedal pulses. No carotid bruits.  Abdomen: Obese, soft, positive epigastric tenderness that extends to the upper quadrants, no guarding/rebound/masses palpated. No hepatosplenomegaly. Bowel sounds positive.  Musculoskeletal: no clubbing / cyanosis. Good ROM, no contractures. Normal muscle tone.  Skin: There is an  acneiform and erythematosus rash on his occipital and posterior cervical areas. Neurologic: CN 2-12 grossly intact. Sensation intact, DTR normal. Strength 5/5 in all 4.  Psychiatric: Normal judgment and insight. Alert and oriented x 4. Normal mood.   Labs on Admission: I have personally reviewed following labs and imaging studies  CBC: Recent Labs  Lab 05/09/18 2156  WBC 14.1*  HGB 15.3  HCT 41.5  MCV 87.7  PLT 298   Basic Metabolic Panel: Recent Labs  Lab 05/09/18 2156  NA 140  K 3.8  CL 109  CO2 23  GLUCOSE 126*  BUN 13  CREATININE 0.81  CALCIUM 9.1   GFR: Estimated Creatinine Clearance: 201.8 mL/min (by C-G formula based on SCr of 0.81 mg/dL). Liver Function Tests: Recent Labs  Lab 05/09/18 2156  AST 26  ALT 29  ALKPHOS 40  BILITOT 0.5  PROT 7.4  ALBUMIN 4.1    Recent Labs  Lab 05/09/18 2156  LIPASE 1,554*   No results for input(s): AMMONIA in the last 168 hours. Coagulation Profile: No results for input(s): INR, PROTIME in the last 168 hours. Cardiac Enzymes: No results for input(s): CKTOTAL, CKMB, CKMBINDEX, TROPONINI in the last 168 hours. BNP (last 3 results) No results for input(s): PROBNP in the last 8760 hours. HbA1C: No results for input(s): HGBA1C in the last 72 hours. CBG: No results for input(s): GLUCAP in the last 168 hours. Lipid Profile: No results for input(s): CHOL, HDL, LDLCALC, TRIG, CHOLHDL, LDLDIRECT in the last 72 hours. Thyroid Function Tests: No results for input(s): TSH, T4TOTAL, FREET4, T3FREE, THYROIDAB in the last 72 hours. Anemia Panel: No results for input(s): VITAMINB12, FOLATE, FERRITIN, TIBC, IRON, RETICCTPCT in the last 72 hours. Urine analysis:    Component Value Date/Time   COLORURINE YELLOW 05/09/2018 2050   APPEARANCEUR CLEAR 05/09/2018 2050   LABSPEC 1.021 05/09/2018 2050   PHURINE 5.0 05/09/2018 2050   GLUCOSEU NEGATIVE 05/09/2018 2050   HGBUR SMALL (A) 05/09/2018 2050   BILIRUBINUR NEGATIVE 05/09/2018 2050   KETONESUR NEGATIVE 05/09/2018 2050   PROTEINUR NEGATIVE 05/09/2018 2050   UROBILINOGEN 1.0 01/14/2014 0032   NITRITE NEGATIVE 05/09/2018 2050   LEUKOCYTESUR NEGATIVE 05/09/2018 2050    Radiological Exams on Admission: Ct Abdomen Pelvis W Contrast  Result Date: 05/10/2018 CLINICAL DATA:  Right upper quadrant pain. History of pancreatitis with pseudocyst, recurrent pain and elevated lipase. EXAM: CT ABDOMEN AND PELVIS WITH CONTRAST TECHNIQUE: Multidetector CT imaging of the abdomen and pelvis was performed using the standard protocol following bolus administration of intravenous contrast. CONTRAST:  ISOVUE-300 IOPAMIDOL (ISOVUE-300) INJECTION 61% COMPARISON:  Most recent CT 08/02/2017 FINDINGS: Lower chest: No pleural fluid. No consolidation. Minor atelectasis. Hepatobiliary: No  focal hepatic lesion. Gallbladder physiologically distended, no calcified stone. No biliary dilatation. Pancreas: Diffuse peripancreatic fat stranding about the entire pancreas. Small amount of free fluid tracks superiorly. No evidence of pancreatic necrosis. Known chronic pseudocyst ventral to the pancreatic head has decreased in size from prior exam currently measuring 3.7 x 3.6 x 6.7 cm, previously 4.7 x 4.5 x 8 cm. Slight decreased wall thickening. This causes mild mass effect on the stomach. No new peripancreatic fluid collection. No pancreatic ductal dilatation. Spleen: Normal in size without focal abnormality. Adrenals/Urinary Tract: Tiny left adrenal myelolipoma. No suspicious adrenal nodule. No hydronephrosis or perinephric edema. Urinary bladder is minimally distended. Stomach/Bowel: Mild mass effect on the stomach by a pancreatic pseudocyst, no definite associated wall thickening. Stranding about the duodenum tract from pancreatic inflammation. No bowel obstruction.  Normal appendix. Small volume of colonic stool. Vascular/Lymphatic: Multiple enlarged and prominent central mesenteric nodes and peripancreatic nodes, measuring up to 10 mm, many of these have decreased in size from prior exam. Prominent retroperitoneal nodes are also unchanged. No progressive adenopathy. Splenic and portal veins are patent. Mesenteric vessels appear patent. Normal caliber abdominal aorta. Reproductive: Prostate is unremarkable. Other: Peripancreatic edema and minimal fluid. No other ascites. No free air. Small amount of fat in the inguinal canals, right greater than left. Musculoskeletal: There are no acute or suspicious osseous abnormalities. IMPRESSION: 1. Acute edematous pancreatitis with diffuse peripancreatic edema. No evidence of pancreatic necrosis. 2. 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 No new peripancreatic  fluid collection. 3. Mesenteric, peripancreatic and retroperitoneal adenopathy, likely reactive in slightly improved from comparison exam. Electronically Signed   By: Narda Rutherford M.D.   On: 05/10/2018 00:40   04/13/2017 echocardiogram  ------------------------------------------------------------------- LV EF: 50% -   55%  ------------------------------------------------------------------- History:   PMH:   Atrial fibrillation.  Cardiomyopathy of unknown etiology. Ablation  Risk factors:  Former tobacco use. Hypertension. Morbidly obese.   ------------------------------------------------------------------- Study Conclusions  - Left ventricle: The cavity size was mildly dilated. Wall   thickness was normal. Systolic function was normal. The estimated   ejection fraction was in the range of 50% to 55%. Wall motion was   normal; there were no regional wall motion abnormalities. Left   ventricular diastolic function parameters were normal. - Mitral valve: There was trivial regurgitation. - Right atrium: Central venous pressure (est): 3 mm Hg. - Atrial septum: No defect or patent foramen ovale was identified. - Tricuspid valve: There was trivial regurgitation. - Pulmonary arteries: PA peak pressure: 15 mm Hg (S). - Pericardium, extracardiac: There was no pericardial effusion.  Impressions:  - Normal LV wall thickness with mild chamber dilatation and LVEF   50-55%. Normal diastolic function. Trivial mitral and tricuspid   regurgitation.  EKG: Independently reviewed.    Assessment/Plan Principal Problem:   Acute pancreatitis Observation/MedSurg. Keep n.p.o. Continue IV fluids. Continue fentanyl 50 mcg IVP every 2 hours as needed. Continue Zofran 4 mg every 6 hours as needed. Consult GI in a.m.  Active Problems:   Paroxysmal atrial fibrillation (HCC)  CHA?DS?-VASc Score of at least 2. Continue Tambocor 100 mg p.o. twice daily. Continue metoprolol ER 25 mg p.o.  daily in the evenings. Continue Eliquis 5 mg p.o. twice daily.    Hypertension Continue metoprolol succinate 25 mg p.o. every evening Continue lisinopril 5 mg p.o. every evening. Monitor blood pressure, heart rate, renal function and electrolytes.   DVT prophylaxis: SCDs. Code Status: Full code. Family Communication:  Disposition Plan: Observation for IV hydration and pain control. Consults called: Routine gastroenterology consult. Admission status: Observation/MedSurg.   Bobette Mo MD [facility] Pager [phone].  If 7PM-7AM, please contact night-coverage [email or web address] Password [email or web address]  05/10/2018, 2:10 AM

## 2018-05-11 ENCOUNTER — Telehealth: Payer: Self-pay | Admitting: Gastroenterology

## 2018-05-11 DIAGNOSIS — K863 Pseudocyst of pancreas: Secondary | ICD-10-CM

## 2018-05-11 DIAGNOSIS — K8500 Idiopathic acute pancreatitis without necrosis or infection: Secondary | ICD-10-CM

## 2018-05-11 DIAGNOSIS — Q453 Other congenital malformations of pancreas and pancreatic duct: Secondary | ICD-10-CM

## 2018-05-11 LAB — CBC WITH DIFFERENTIAL/PLATELET
Basophils Absolute: 0 10*3/uL (ref 0.0–0.1)
Basophils Relative: 0 %
Eosinophils Absolute: 0.4 10*3/uL (ref 0.0–0.7)
Eosinophils Relative: 3 %
HCT: 41.5 % (ref 39.0–52.0)
Hemoglobin: 13.3 g/dL (ref 13.0–17.0)
Lymphocytes Relative: 16 %
Lymphs Abs: 2.2 10*3/uL (ref 0.7–4.0)
MCH: 29.1 pg (ref 26.0–34.0)
MCHC: 32 g/dL (ref 30.0–36.0)
MCV: 90.8 fL (ref 78.0–100.0)
Monocytes Absolute: 1.1 10*3/uL — ABNORMAL HIGH (ref 0.1–1.0)
Monocytes Relative: 8 %
Neutro Abs: 10.1 10*3/uL — ABNORMAL HIGH (ref 1.7–7.7)
Neutrophils Relative %: 73 %
Platelets: 294 10*3/uL (ref 150–400)
RBC: 4.57 MIL/uL (ref 4.22–5.81)
RDW: 13.2 % (ref 11.5–15.5)
WBC: 13.9 10*3/uL — ABNORMAL HIGH (ref 4.0–10.5)

## 2018-05-11 LAB — COMPREHENSIVE METABOLIC PANEL
ALT: 19 U/L (ref 0–44)
AST: 17 U/L (ref 15–41)
Albumin: 3.6 g/dL (ref 3.5–5.0)
Alkaline Phosphatase: 37 U/L — ABNORMAL LOW (ref 38–126)
Anion gap: 8 (ref 5–15)
BUN: 9 mg/dL (ref 6–20)
CO2: 25 mmol/L (ref 22–32)
Calcium: 8.4 mg/dL — ABNORMAL LOW (ref 8.9–10.3)
Chloride: 107 mmol/L (ref 98–111)
Creatinine, Ser: 0.77 mg/dL (ref 0.61–1.24)
GFR calc Af Amer: >60 mL/min (ref >60)
GFR calc non Af Amer: >60 mL/min (ref >60)
Glucose, Bld: 88 mg/dL (ref 70–99)
Potassium: 3.6 mmol/L (ref 3.5–5.1)
Sodium: 140 mmol/L (ref 135–145)
Total Bilirubin: 1.3 mg/dL — ABNORMAL HIGH (ref 0.3–1.2)
Total Protein: 6.9 g/dL (ref 6.5–8.1)

## 2018-05-11 LAB — LIPID PANEL
Cholesterol: 116 mg/dL (ref 0–200)
HDL: 31 mg/dL — ABNORMAL LOW (ref >40)
LDL Cholesterol: 73 mg/dL (ref 0–99)
Total CHOL/HDL Ratio: 3.7 RATIO
Triglycerides: 61 mg/dL (ref <150)
VLDL: 12 mg/dL (ref 0–40)

## 2018-05-11 LAB — LIPASE, BLOOD: Lipase: 63 U/L — ABNORMAL HIGH (ref 11–51)

## 2018-05-11 MED ORDER — PANCRELIPASE (LIP-PROT-AMYL) 12000-38000 UNITS PO CPEP
108000.0000 [IU] | ORAL_CAPSULE | Freq: Three times a day (TID) | ORAL | Status: DC
  Administered 2018-05-12 (×2): 36000 [IU] via ORAL
  Filled 2018-05-11 (×2): qty 9

## 2018-05-11 NOTE — Progress Notes (Signed)
SubjeJKoreaackalyCarolan Shiversolidation. Minor atelectasis. Hepatobiliary: No focal hepatic lesion. Gallbladder physiologically distended, no calcified stone. No biliary dilatation. Pancreas: Diffuse peripancreatic fat stranding about the entire pancreas. Small amount of free fluid tracks superiorly. No evidence of pancreatic necrosis. Known chronic pseudocyst ventral to the pancreatic head has decreased in size from prior exam currently measuring 3.7 x 3.6 x 6.7 cm, previously 4.7 x 4.5 x 8 cm. Slight decreased wall thickening. This causes mild mass effect on the stomach. No new peripancreatic fluid collection. No pancreatic ductal dilatation. Spleen: Normal in size without focal abnormality. Adrenals/Urinary Tract: Tiny left adrenal myelolipoma. No suspicious adrenal nodule. No hydronephrosis or perinephric edema. Urinary bladder is minimally distended. Stomach/Bowel: Mild mass effect on the stomach by a pancreatic pseudocyst, no deThaliaKorea Nathaniel [name] Peered aThaliaKorJack alyn LombarCaThaliaKorea Nathaniel [name] PeerAS EMENT>ndJackalyn Lombardng  about the duodenum tract from pancreatic inflammation. No bowel obstruction. Normal appendix. Small volume of colonic stool. Vascular/Lymphatic: Multiple enlarged and prominent central mesenteric nodes and peripancreatic nodes, measuring up to 10 mm, many of these have decreased in size  from prior exam. Prominent retroperitoneal nodes are also unchanged. No progressive adenopathy. Splenic and portal veins are patent. Mesenteric vessels appear patent. Normal caliber abdominal aorta. Reproductive: Prostate is unremarkable. Other: Peripancreatic edema and minimal fluid. No other ascites. No free air. Small amount of fat in the inguinal canals, right greater than left. Musculoskeletal: There are no acute or suspicious osseous abnormalities. IMPRESSION: 1. Acute edematous pancreatitis with diffuse peripancreatic edema. No evidence of pancreatic necrosis. 2. 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 No new peripancreatic fluid collection. 3. Mesenteric, peripancreatic and retroperitoneal adenopathy, likely reactive in slightly improved from comparison exam. Electronically Signed   By: Narda Rutherford M.D.   On: 05/10/2018 00:40   Nathaniel [name] Abdomen Limited Ruq  Result Date: 05/10/2018 CLINICAL DATA:  Acute pancreatitis EXAM: ULTRASOUND ABDOMEN LIMITED RIGHT UPPER QUADRANT COMPARISON:  CT abdomen pelvis May 10, 2018. FINDINGS: Gallbladder: No wall thickening visualized. There is probably sludge in the gallbladder. No sonographic Murphy sign noted by sonographer. Common bile duct: Diameter: 4 mm Liver: No focal lesion identified. There is diffuse increased echotexture of the liver. Portal vein is patent on color Doppler imaging with normal direction of blood flow towards the liver. CT noted pancreatic pseudocyst is identified on ultrasound measuring 6 x 3.2 x 4.8 cm. IMPRESSION: No ultrasound evidence of acute cholecystitis. Fatty infiltration of liver. Pancreatic pseudocyst identified. Electronically Signed   By: Sherian Rein M.D.   On: 05/10/2018 15:12  [2 weeks]   Assessment:  27 year old gentleman with recurrent acute pancreatitis with known pseudocyst.  Patient's first episode of pancreatitis  was around age 57 prior to any alcohol use.  Etiology unclear, possible idiopathic pancreatitis per patient.  Work-up was at [facility].  Records not available.  He has been hospitalized at least 5 times now, one episode complicated by SIRS 2017.  Last hospitalization December 2018.  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  Back in December the plans were for possible EUS with drainage but this did not occur.  Patient never received appointment date and states that he began feeling better so he just left alone.    IgG subclass 4 slightly elevated at 106, previously 114 upper limits of normal 96.  Unclear significance. Has had prior alcohol use sometimes around episodes of pancreatitis.  His gallbladder remains in situ as well.  Patient's pain has improved.  He feels hungry.  Plan: 1. Review records from [facility] as available. 2. Clear liquid diet.  Weight-based Creon. 3. Plan for EUS with possible pancreatic pseudocyst drainage at [facility].  Can also be evaluated for possible microlithiasis at that time.  Nathaniel [name]. [facility] [phone] 9/12/201912:57 PM     LOS: 1 day

## 2018-05-11 NOTE — Telephone Encounter (Addendum)
Patient needs EUS at [facility] for   1. chronic pancreatic pseudocyst needing drainage 2. recurrent pancreatitis unclear etiology ie rule out autoimmune with slightly elevated IgG subclass 4, rule out microlithiasis of the gallbladder.     Patient needs f/u ov with [place] in 3 months.

## 2018-05-11 NOTE — Telephone Encounter (Signed)
Referral sent 

## 2018-05-11 NOTE — Progress Notes (Signed)
Order from GI noted to have records obtained from [facility] hospitals regarding admissions for pancreatitis. Records from [facility] on hard chart this afternoon. Notified Tana Coast, PA, states they will review when making rounds. Earnstine Regal, RN

## 2018-05-11 NOTE — Progress Notes (Signed)
PROGRESS NOTE  Ramirez Fullbright ZOX:[id number] DOB: 1991-05-15 DOA: 05/09/2018 PCP: Toma Deiters, MD  Brief History:  27 year old male with a history of paroxysmal atrial s/p ablation (12/09/16) on apixaban, chronic chronic pancreatitis, systolic CHF and chronic low back pain presenting with 1 week history of epigastric and right upper quadrant abdominal pain associated with nausea.  The patient denied any vomiting.  He denies any alcohol use or new medications.  He denies any illicit drug use.  There is no fevers, chills, chest pain, shortness of breath.  Over this past week prior to admission, his abdominal pain has been colicky in nature, but worsened on the evening of 05/09/2018 which prompted him to go to the emergency department.  In the emergency department, CT of the abdomen and pelvis showed diffuse peripancreatic stranding about the entire pancreas without necrosis.  There was a known chronic pseudocyst which appears to have decreased in size from his last CT.  There is also multiple enlarged mesenteric and peripancreatic lymph nodes.  The splenic, portal, and mesenteric vessels were all patent.  The patient was admitted for acute pancreatitis.  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 -had 2 admissions at [facility] with pancreatitis--July 2012 and Aug 2012  Paroxysmal atrial fibrillation/ atrial flutter -continue apixaban -s/p ablation -presently in sinus -continue metoprolol succinate  Essential Hypertension -continue metoprolol succinate -holding lisinopril  Morbid Obesity -BMI 46.09 -lifestyle modification  Tachycardia mediated cardiomyopathy -04/03/17 Echo  EF 50-55% -clinically compensated     Disposition Plan:   Home in 1-2 days  Family Communication:  No Family at bedside   Consultants:  GI  Code Status:  FULL   DVT Prophylaxis:  apixaban   Procedures: As Listed in Progress Note Above  Antibiotics: None    Subjective: Overall abd pain is improving.  He wants to advance to clears.  He denies cp, sob, n/v/d, dysuria.  abd pain is still mild to mod in epigastric and RUQ and intermittent.  No hematochezia or melena  Objective: Vitals:   05/10/18 1409 05/10/18 2128 05/11/18 0716 05/11/18 1430  BP: 125/64 133/70 118/62 105/63  Pulse: 66 67 85 71  Resp: 17 19 18 20   Temp: 98.6 F (37 C) 99.3 F (37.4 C) 98.7 F (37.1 C) 98.7 F (37.1 C)  TempSrc: Oral Oral Oral   SpO2: 94% 98% 96% 92%  Weight:      Height:        Intake/Output Summary (Last 24 hours) at 05/11/2018 1518 Last data filed at 05/11/2018 1455 Gross per 24 hour  Intake 2611.85 ml  Output 2350 ml  Net 261.85 ml   Weight change:  Exam:   General:  Pt is alert, follows commands appropriately, not in acute distress  HEENT: No icterus, No thrush, No neck mass, [place]/AT  Cardiovascular: RRR, S1/S2, no rubs, no gallops  Respiratory: CTA bilaterally, no wheezing, no crackles, no rhonchi  Abdomen: Soft/+BS, RUQ and epigastric tender, non distended, no guarding  Extremities: No edema, No lymphangitis, No petechiae, No rashes, no synovitis   Data Reviewed: I have personally reviewed following labs and imaging studies Basic Metabolic Panel: Recent Labs  Lab 05/09/18 2156 05/10/18 0516 05/11/18 0457  NA 140 140 140  K  3.8 3.7 3.6  CL 109 112* 107  CO2 23 24 25   GLUCOSE 126* 108* 88  BUN 13 12 9   CREATININE 0.81 0.72 0.77  CALCIUM 9.1 8.1* 8.4*  MG 2.1  --   --   PHOS 3.8  --   --    Liver Function Tests: Recent Labs  Lab 05/09/18 2156 05/10/18 0516 05/11/18 0457  AST 26 19 17   ALT 29 24 19   ALKPHOS 40 34* 37*  BILITOT 0.5  0.4 1.3*  PROT 7.4 6.2* 6.9  ALBUMIN 4.1 3.4* 3.6   Recent Labs  Lab 05/09/18 2156 05/10/18 0516 05/11/18 0457  LIPASE 1,554* 335* 63*   No results for input(s): AMMONIA in the last 168 hours. Coagulation Profile: No results for input(s): INR, PROTIME in the last 168 hours. CBC: Recent Labs  Lab 05/09/18 2156 05/10/18 0516 05/11/18 0457  WBC 14.1* 12.1* 13.9*  NEUTROABS  --  8.7* 10.1*  HGB 15.3 12.6* 13.3  HCT 41.5 39.1 41.5  MCV 87.7 89.3 90.8  PLT 298 266 294   Cardiac Enzymes: No results for input(s): CKTOTAL, CKMB, CKMBINDEX, TROPONINI in the last 168 hours. BNP: Invalid input(s): POCBNP CBG: No results for input(s): GLUCAP in the last 168 hours. HbA1C: No results for input(s): HGBA1C in the last 72 hours. Urine analysis:    Component Value Date/Time   COLORURINE YELLOW 05/09/2018 2050   APPEARANCEUR CLEAR 05/09/2018 2050   LABSPEC 1.021 05/09/2018 2050   PHURINE 5.0 05/09/2018 2050   GLUCOSEU NEGATIVE 05/09/2018 2050   HGBUR SMALL (A) 05/09/2018 2050   BILIRUBINUR NEGATIVE 05/09/2018 2050   KETONESUR NEGATIVE 05/09/2018 2050   PROTEINUR NEGATIVE 05/09/2018 2050   UROBILINOGEN 1.0 01/14/2014 0032   NITRITE NEGATIVE 05/09/2018 2050   LEUKOCYTESUR NEGATIVE 05/09/2018 2050   Sepsis Labs: @LABRCNTIP (procalcitonin:4,lacticidven:4) )No results found for this or any previous visit (from the past 240 hour(s)).   Scheduled Meds: . apixaban  5 mg Oral BID  . cholecalciferol  1,000 Units Oral QPM  . flecainide  100 mg Oral BID  . lipase/protease/amylase  108,000 Units Oral TID WC  . lisinopril  5 mg Oral QPM  . metoprolol succinate  25 mg Oral QPM  . pantoprazole  40 mg Oral Daily   Continuous Infusions: . sodium chloride 125 mL/hr at 05/11/18 1200    Procedures/Studies: Ct Abdomen Pelvis W Contrast  Result Date: 05/10/2018 CLINICAL DATA:  Right upper quadrant pain. History of pancreatitis with pseudocyst, recurrent pain and elevated lipase. EXAM: CT  ABDOMEN AND PELVIS WITH CONTRAST TECHNIQUE: Multidetector CT imaging of the abdomen and pelvis was performed using the standard protocol following bolus administration of intravenous contrast. CONTRAST:  ISOVUE-300 IOPAMIDOL (ISOVUE-300) INJECTION 61% COMPARISON:  Most recent CT 08/02/2017 FINDINGS: Lower chest: No pleural fluid. No consolidation. Minor atelectasis. Hepatobiliary: No focal hepatic lesion. Gallbladder physiologically distended, no calcified stone. No biliary dilatation. Pancreas: Diffuse peripancreatic fat stranding about the entire pancreas. Small amount of free fluid tracks superiorly. No evidence of pancreatic necrosis. Known chronic pseudocyst ventral to the pancreatic head has decreased in size from prior exam currently measuring 3.7 x 3.6 x 6.7 cm, previously 4.7 x 4.5 x 8 cm. Slight decreased wall thickening. This causes mild mass effect on the stomach. No new peripancreatic fluid collection. No pancreatic ductal dilatation. Spleen: Normal in size without focal abnormality. Adrenals/Urinary Tract: Tiny left adrenal myelolipoma. No suspicious adrenal nodule. No hydronephrosis or perinephric edema. Urinary bladder is minimally distended. Stomach/Bowel: Mild mass effect on the  stomach by a pancreatic pseudocyst, no definite associated wall thickening. Stranding about the duodenum tract from pancreatic inflammation. No bowel obstruction. Normal appendix. Small volume of colonic stool. Vascular/Lymphatic: Multiple enlarged and prominent central mesenteric nodes and peripancreatic nodes, measuring up to 10 mm, many of these have decreased in size from prior exam. Prominent retroperitoneal nodes are also unchanged. No progressive adenopathy. Splenic and portal veins are patent. Mesenteric vessels appear patent. Normal caliber abdominal aorta. Reproductive: Prostate is unremarkable. Other: Peripancreatic edema and minimal fluid. No other ascites. No free air. Small amount of fat in the inguinal  canals, right greater than left. Musculoskeletal: There are no acute or suspicious osseous abnormalities. IMPRESSION: 1. Acute edematous pancreatitis with diffuse peripancreatic edema. No evidence of pancreatic necrosis. 2. 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 No new peripancreatic fluid collection. 3. Mesenteric, peripancreatic and retroperitoneal adenopathy, likely reactive in slightly improved from comparison exam. Electronically Signed   By: Narda Rutherford M.D.   On: 05/10/2018 00:40   US Abdomen Limited Ruq  Result Date: 05/10/2018 CLINICAL DATA:  Acute pancreatitis EXAM: ULTRASOUND ABDOMEN LIMITED RIGHT UPPER QUADRANT COMPARISON:  CT abdomen pelvis May 10, 2018. FINDINGS: Gallbladder: No wall thickening visualized. There is probably sludge in the gallbladder. No sonographic Murphy sign noted by sonographer. Common bile duct: Diameter: 4 mm Liver: No focal lesion identified. There is diffuse increased echotexture of the liver. Portal vein is patent on color Doppler imaging with normal direction of blood flow towards the liver. CT noted pancreatic pseudocyst is identified on ultrasound measuring 6 x 3.2 x 4.8 cm. IMPRESSION: No ultrasound evidence of acute cholecystitis. Fatty infiltration of liver. Pancreatic pseudocyst identified. Electronically Signed   By: Sherian Rein M.D.   On: 05/10/2018 15:12    Catarina Hartshorn, DO  [facility] Pager [phone]  If 7PM-7AM, please contact night-coverage [email or web address] Password [email or web address] 05/11/2018, 3:18 PM   LOS: 1 day

## 2018-05-11 NOTE — Addendum Note (Signed)
Addended by: Tommie Sams on: 05/11/2018 01:16 PM   Modules accepted: Orders

## 2018-05-11 NOTE — Progress Notes (Signed)
MD note mentioned holding lisinopril, discussed with Dr. Arbutus Leas this evening. Stated to hold evening dose. Patient ordered to have 9 pancrease capsules with evening meal. States he takes one capsule with each meal at home and "Verlon Au said she wanted me to double the dose." Discussed with pharmacy who felt order should be clarified with MD. Ordered by Tana Coast, PA. Dr. Karilyn Cota is now on call for GI. Discussed with Dr. Arbutus Leas who stated Dr. Karilyn Cota should be paged to clarify from GI standpoint. Paged Dr. Karilyn Cota. Awaiting return call to clarify order. Earnstine Regal, RN

## 2018-05-12 ENCOUNTER — Encounter: Payer: Self-pay | Admitting: Gastroenterology

## 2018-05-12 LAB — BASIC METABOLIC PANEL
Anion gap: 6 (ref 5–15)
BUN: 8 mg/dL (ref 6–20)
CO2: 29 mmol/L (ref 22–32)
Calcium: 8.1 mg/dL — ABNORMAL LOW (ref 8.9–10.3)
Chloride: 106 mmol/L (ref 98–111)
Creatinine, Ser: 0.7 mg/dL (ref 0.61–1.24)
GFR calc Af Amer: >60 mL/min (ref >60)
GFR calc non Af Amer: >60 mL/min (ref >60)
Glucose, Bld: 113 mg/dL — ABNORMAL HIGH (ref 70–99)
Potassium: 3.7 mmol/L (ref 3.5–5.1)
Sodium: 141 mmol/L (ref 135–145)

## 2018-05-12 LAB — CBC
HCT: 36.8 % — ABNORMAL LOW (ref 39.0–52.0)
Hemoglobin: 11.8 g/dL — ABNORMAL LOW (ref 13.0–17.0)
MCH: 28.9 pg (ref 26.0–34.0)
MCHC: 32.1 g/dL (ref 30.0–36.0)
MCV: 90 fL (ref 78.0–100.0)
Platelets: 267 10*3/uL (ref 150–400)
RBC: 4.09 MIL/uL — ABNORMAL LOW (ref 4.22–5.81)
RDW: 12.9 % (ref 11.5–15.5)
WBC: 9.4 10*3/uL (ref 4.0–10.5)

## 2018-05-12 MED ORDER — PANCRELIPASE (LIP-PROT-AMYL) 36000-114000 UNITS PO CPEP: 108000.0000 [IU] | ORAL_CAPSULE | Freq: Three times a day (TID) | ORAL | Status: DC

## 2018-05-12 NOTE — Discharge Summary (Signed)
Physician Discharge Summary  Nathaniel [name] KPT:[id number] DOB: 05/22/1991 DOA: 05/09/2018  PCP: Toma Deiters, MD  Admit date: 05/09/2018 Discharge date: 05/12/2018  Admitted From: Home Disposition:  Home  Recommendations for Outpatient Follow-up:  1. Follow up with PCP in 1-2 weeks 2. Please obtain BMP/CBC in one week 3. Please follow up on the following pending results:    Discharge Condition: Stable CODE STATUS:FULL Diet recommendation: Heart Healthy   Brief/Interim Summary: 27 year old male with a history of paroxysmal atrials/pablation(12/09/16)on apixaban, chronic chronic pancreatitis, systolic CHF and chronic low back pain presenting with 1 week history of epigastric and right upper quadrant abdominal pain associated with nausea. The patient denied any vomiting. He denies any alcohol use or new medications. He denies any illicit drug use. There is no fevers, chills, chest pain, shortness of breath. Over this past week prior to admission, his abdominal pain has been colicky in nature, but worsened on the evening of 05/09/2018 which prompted him to go to the emergency department. In the emergency department, CT of the abdomen and pelvis showed diffuse peripancreatic stranding about the entire pancreas without necrosis. There was a known chronic pseudocyst which appears to have decreased in size from his last CT. There is also multiple enlarged mesenteric and peripancreatic lymph nodes. The splenic, portal, and mesenteric vessels were all patent. The patient was admitted for acute pancreatitis.   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 -had 2 admissions at [facility] with pancreatitis--July 2012 and Aug 2012  Paroxysmal atrial fibrillation/ atrial flutter -continue apixaban -s/p ablation -presently in sinus -continue metoprolol succinate  Essential Hypertension -continue metoprolol succinate -holding lisinopril  Morbid Obesity -BMI 46.09 -lifestyle modification  Tachycardia mediated cardiomyopathy -04/03/17 Echo EF 50-55% -clinically compensated     Discharge Instructions   Allergies as of 05/12/2018   No Known Allergies     Medication List    TAKE these medications   ELIQUIS 5 MG Tabs tablet Generic drug:  apixaban TAKE 1 TABLET BY MOUTH TWICE DAILY What changed:  how much to take   flecainide 100 MG tablet Commonly known as:  TAMBOCOR Take 1 tablet (100 mg total) by mouth 2 (two) times daily.   HYDROcodone-acetaminophen 5-325 MG tablet Commonly known as:  NORCO/VICODIN Take 2 tablets by mouth every 6 (six) hours as needed. What changed:  reasons to take this   lipase/protease/amylase 17001 UNITS Cpep capsule Commonly known as:  CREON Take 3 capsules (108,000 Units total) by mouth 3 (three) times daily with meals. What changed:    how much to take  when to take this   lisinopril 5 MG tablet Commonly known as:  PRINIVIL,ZESTRIL TAKE 1 TABLET(5 MG) BY MOUTH DAILY What changed:  See the new instructions.   metoprolol succinate 25 MG 24 hr tablet Commonly known as:  TOPROL-XL TAKE 1 TABLET(25 MG) BY MOUTH DAILY What changed:  See the new instructions.   triamcinolone cream 0.1 % Commonly known as:  KENALOG Apply 1 application topically daily as needed (for irritation).   Vitamin D3 1000 units Caps Take 1,000 Units by mouth every evening.  No Known Allergies  Consultations:  [facility]   Procedures/Studies: Ct Abdomen Pelvis W Contrast  Result Date: 05/10/2018 CLINICAL DATA:  Right upper quadrant pain. History of pancreatitis with pseudocyst, recurrent pain and elevated lipase. EXAM: CT ABDOMEN AND PELVIS WITH CONTRAST TECHNIQUE: Multidetector CT imaging of the abdomen and pelvis was performed using the standard protocol following bolus administration of intravenous contrast. CONTRAST:  ISOVUE-300 IOPAMIDOL (ISOVUE-300) INJECTION 61% COMPARISON:  Most recent CT 08/02/2017 FINDINGS: Lower chest: No pleural fluid. No consolidation. Minor atelectasis. Hepatobiliary: No focal hepatic lesion. Gallbladder physiologically distended, no calcified stone. No biliary dilatation. Pancreas: Diffuse peripancreatic fat stranding about the entire pancreas. Small amount of free fluid tracks superiorly. No evidence of pancreatic necrosis. Known chronic pseudocyst ventral to the pancreatic head has decreased in size from prior exam currently measuring 3.7 x 3.6 x 6.7 cm, previously 4.7 x 4.5 x 8 cm. Slight decreased wall thickening. This causes mild mass effect on the stomach. No new peripancreatic fluid collection. No pancreatic ductal dilatation. Spleen: Normal in size without focal abnormality. Adrenals/Urinary Tract: Tiny left adrenal myelolipoma. No suspicious adrenal nodule. No hydronephrosis or perinephric edema. Urinary bladder is minimally distended. Stomach/Bowel: Mild mass effect on the stomach by a pancreatic pseudocyst, no definite associated wall thickening. Stranding about the duodenum tract from pancreatic inflammation. No bowel obstruction. Normal appendix. Small volume of colonic stool. Vascular/Lymphatic: Multiple enlarged and prominent central mesenteric nodes and peripancreatic nodes, measuring up to 10 mm, many of these have decreased in size from prior exam. Prominent retroperitoneal nodes are also unchanged. No progressive adenopathy. Splenic and portal veins are patent.  Mesenteric vessels appear patent. Normal caliber abdominal aorta. Reproductive: Prostate is unremarkable. Other: Peripancreatic edema and minimal fluid. No other ascites. No free air. Small amount of fat in the inguinal canals, right greater than left. Musculoskeletal: There are no acute or suspicious osseous abnormalities. IMPRESSION: 1. Acute edematous pancreatitis with diffuse peripancreatic edema. No evidence of pancreatic necrosis. 2. 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 No new peripancreatic fluid collection. 3. Mesenteric, peripancreatic and retroperitoneal adenopathy, likely reactive in slightly improved from comparison exam. Electronically Signed   By: Narda Rutherford M.D.   On: 05/10/2018 00:40   US Abdomen Limited Ruq  Result Date: 05/10/2018 CLINICAL DATA:  Acute pancreatitis EXAM: ULTRASOUND ABDOMEN LIMITED RIGHT UPPER QUADRANT COMPARISON:  CT abdomen pelvis May 10, 2018. FINDINGS: Gallbladder: No wall thickening visualized. There is probably sludge in the gallbladder. No sonographic Murphy sign noted by sonographer. Common bile duct: Diameter: 4 mm Liver: No focal lesion identified. There is diffuse increased echotexture of the liver. Portal vein is patent on color Doppler imaging with normal direction of blood flow towards the liver. CT noted pancreatic pseudocyst is identified on ultrasound measuring 6 x 3.2 x 4.8 cm. IMPRESSION: No ultrasound evidence of acute cholecystitis. Fatty infiltration of liver. Pancreatic pseudocyst identified. Electronically Signed   By: Sherian Rein M.D.   On: 05/10/2018 15:12         Discharge Exam: Vitals:   05/11/18 2126 05/12/18 0613  BP: 115/72 126/69  Pulse: 82 71  Resp:    Temp: 97.9 F (36.6 C) 99.1 F (37.3 C)  SpO2: 97% 95%   Vitals:   05/11/18 0716 05/11/18 1430 05/11/18 2126 05/12/18 0613  BP: 118/62 105/63 115/72 126/69   Pulse: 85 71 82 71  Resp: 18 20    Temp: 98.7 F (  37.1 C) 98.7 F (37.1 C) 97.9 F (36.6 C) 99.1 F (37.3 C)  TempSrc: Oral  Oral Oral  SpO2: 96% 92% 97% 95%  Weight:      Height:        General: Pt is alert, awake, not in acute distress Cardiovascular: RRR, S1/S2 +, no rubs, no gallops Respiratory: CTA bilaterally, no wheezing, no rhonchi Abdominal: Soft, NT, ND, bowel sounds + Extremities: no edema, no cyanosis   The results of significant diagnostics from this hospitalization (including imaging, microbiology, ancillary and laboratory) are listed below for reference.    Significant Diagnostic Studies: Ct Abdomen Pelvis W Contrast  Result Date: 05/10/2018 CLINICAL DATA:  Right upper quadrant pain. History of pancreatitis with pseudocyst, recurrent pain and elevated lipase. EXAM: CT ABDOMEN AND PELVIS WITH CONTRAST TECHNIQUE: Multidetector CT imaging of the abdomen and pelvis was performed using the standard protocol following bolus administration of intravenous contrast. CONTRAST:  ISOVUE-300 IOPAMIDOL (ISOVUE-300) INJECTION 61% COMPARISON:  Most recent CT 08/02/2017 FINDINGS: Lower chest: No pleural fluid. No consolidation. Minor atelectasis. Hepatobiliary: No focal hepatic lesion. Gallbladder physiologically distended, no calcified stone. No biliary dilatation. Pancreas: Diffuse peripancreatic fat stranding about the entire pancreas. Small amount of free fluid tracks superiorly. No evidence of pancreatic necrosis. Known chronic pseudocyst ventral to the pancreatic head has decreased in size from prior exam currently measuring 3.7 x 3.6 x 6.7 cm, previously 4.7 x 4.5 x 8 cm. Slight decreased wall thickening. This causes mild mass effect on the stomach. No new peripancreatic fluid collection. No pancreatic ductal dilatation. Spleen: Normal in size without focal abnormality. Adrenals/Urinary Tract: Tiny left adrenal myelolipoma. No suspicious adrenal nodule. No hydronephrosis or  perinephric edema. Urinary bladder is minimally distended. Stomach/Bowel: Mild mass effect on the stomach by a pancreatic pseudocyst, no definite associated wall thickening. Stranding about the duodenum tract from pancreatic inflammation. No bowel obstruction. Normal appendix. Small volume of colonic stool. Vascular/Lymphatic: Multiple enlarged and prominent central mesenteric nodes and peripancreatic nodes, measuring up to 10 mm, many of these have decreased in size from prior exam. Prominent retroperitoneal nodes are also unchanged. No progressive adenopathy. Splenic and portal veins are patent. Mesenteric vessels appear patent. Normal caliber abdominal aorta. Reproductive: Prostate is unremarkable. Other: Peripancreatic edema and minimal fluid. No other ascites. No free air. Small amount of fat in the inguinal canals, right greater than left. Musculoskeletal: There are no acute or suspicious osseous abnormalities. IMPRESSION: 1. Acute edematous pancreatitis with diffuse peripancreatic edema. No evidence of pancreatic necrosis. 2. 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 No new peripancreatic fluid collection. 3. Mesenteric, peripancreatic and retroperitoneal adenopathy, likely reactive in slightly improved from comparison exam. Electronically Signed   By: Narda Rutherford M.D.   On: 05/10/2018 00:40   US Abdomen Limited Ruq  Result Date: 05/10/2018 CLINICAL DATA:  Acute pancreatitis EXAM: ULTRASOUND ABDOMEN LIMITED RIGHT UPPER QUADRANT COMPARISON:  CT abdomen pelvis May 10, 2018. FINDINGS: Gallbladder: No wall thickening visualized. There is probably sludge in the gallbladder. No sonographic Murphy sign noted by sonographer. Common bile duct: Diameter: 4 mm Liver: No focal lesion identified. There is diffuse increased echotexture of the liver. Portal vein is patent on color Doppler imaging with normal  direction of blood flow towards the liver. CT noted pancreatic pseudocyst is identified on ultrasound measuring 6 x 3.2 x 4.8 cm. IMPRESSION: No ultrasound evidence of acute cholecystitis. Fatty infiltration of liver. Pancreatic pseudocyst identified.  Electronically Signed   By: Sherian Rein M.D.   On: 05/10/2018 15:12     Microbiology: No results found for this or any previous visit (from the past 240 hour(s)).   Labs: Basic Metabolic Panel: Recent Labs  Lab 05/09/18 2156 05/10/18 0516 05/11/18 0457 05/12/18 0446  NA 140 140 140 141  K 3.8 3.7 3.6 3.7  CL 109 112* 107 106  CO2 23 24 25 29   GLUCOSE 126* 108* 88 113*  BUN 13 12 9 8   CREATININE 0.81 0.72 0.77 0.70  CALCIUM 9.1 8.1* 8.4* 8.1*  MG 2.1  --   --   --   PHOS 3.8  --   --   --    Liver Function Tests: Recent Labs  Lab 05/09/18 2156 05/10/18 0516 05/11/18 0457  AST 26 19 17   ALT 29 24 19   ALKPHOS 40 34* 37*  BILITOT 0.5 0.4 1.3*  PROT 7.4 6.2* 6.9  ALBUMIN 4.1 3.4* 3.6   Recent Labs  Lab 05/09/18 2156 05/10/18 0516 05/11/18 0457  LIPASE 1,554* 335* 63*   No results for input(s): AMMONIA in the last 168 hours. CBC: Recent Labs  Lab 05/09/18 2156 05/10/18 0516 05/11/18 0457 05/12/18 0446  WBC 14.1* 12.1* 13.9* 9.4  NEUTROABS  --  8.7* 10.1*  --   HGB 15.3 12.6* 13.3 11.8*  HCT 41.5 39.1 41.5 36.8*  MCV 87.7 89.3 90.8 90.0  PLT 298 266 294 267   Cardiac Enzymes: No results for input(s): CKTOTAL, CKMB, CKMBINDEX, TROPONINI in the last 168 hours. BNP: Invalid input(s): POCBNP CBG: No results for input(s): GLUCAP in the last 168 hours.  Time coordinating discharge:  36 minutes  Signed:  Catarina Hartshorn, DO [facility] Pager: [phone] 05/12/2018, 1:49 PM

## 2018-05-12 NOTE — Telephone Encounter (Signed)
PATIENT SCHEDULED AND LETTER SENT  °

## 2018-05-12 NOTE — Progress Notes (Signed)
Patient's IV removed and site intact. Patient discharged with personal belongings and prescriptions.  

## 2018-05-12 NOTE — Progress Notes (Deleted)
CRITICAL VALUE ALERT  Critical Value:  Aerobic--gram positive cocci x 2 samples  Date & Time Notied: 05/12/2018 @ 0142    Provider Notified: Dr. Sherryll Burger   Orders Received/Actions taken:

## 2018-05-12 NOTE — Progress Notes (Signed)
Patient refused 108,000 units of creon this AM. Informed Dr. Karilyn Cota who told me to give patient his original home dose 36,000 units of creon this AM.  Dr. Karilyn Cota says he will address the change in medication with the patient.

## 2018-05-12 NOTE — Progress Notes (Signed)
Subjective: Abdominal pain improved, now with mild lower abdominal tenderness. No N/V. Tolerating meals well without increased pain. No bowel movement in a couple days. No other GI complaints.  Objective: Vital signs in last 24 hours: Temp:  [97.9 F (36.6 C)-99.1 F (37.3 C)] 99.1 F (37.3 C) (09/13 4098) Pulse Rate:  [71-82] 71 (09/13 0613) Resp:  [20] 20 (09/12 1430) BP: (105-126)/(63-72) 126/69 (09/13 0613) SpO2:  [92 %-97 %] 95 % (09/13 1191) Last BM Date: 05/10/18 General:   Alert and oriented, pleasant Head:  Normocephalic and atraumatic. Eyes:  No icterus, sclera clear. Conjuctiva pink.  Heart:  S1, S2 present, no murmurs noted.  Lungs: Clear to auscultation bilaterally, without wheezing, rales, or rhonchi.  Abdomen:  Bowel sounds present, soft, non-distended. Mild generalized/lower soreness with palpation. No HSM or hernias noted. No rebound or guarding. No masses appreciated  Msk:  Symmetrical without gross deformities. Pulses:  Normal bilateral DP pulses noted. Extremities:  Without clubbing or edema. Neurologic:  Alert and  oriented x4;  grossly normal neurologically. Skin:  Warm and dry, intact without significant lesions.  Psych:  Alert and cooperative. Normal mood and affect.  Intake/Output from previous day: 09/12 0701 - 09/13 0700 In: 4706.4 [P.O.:360; I.V.:4346.4] Out: 2100 [Urine:2100] Intake/Output this shift: No intake/output data recorded.  Lab Results: Recent Labs    05/10/18 0516 05/11/18 0457 05/12/18 0446  WBC 12.1* 13.9* 9.4  HGB 12.6* 13.3 11.8*  HCT 39.1 41.5 36.8*  PLT 266 294 267   BMET Recent Labs    05/10/18 0516 05/11/18 0457 05/12/18 0446  NA 140 140 141  K 3.7 3.6 3.7  CL 112* 107 106  CO2 24 25 29   GLUCOSE 108* 88 113*  BUN 12 9 8   CREATININE 0.72 0.77 0.70  CALCIUM 8.1* 8.4* 8.1*   LFT Recent Labs    05/09/18 2156 05/10/18 0516 05/11/18 0457  PROT 7.4 6.2* 6.9  ALBUMIN 4.1 3.4* 3.6  AST 26 19 17   ALT 29  24 19   ALKPHOS 40 34* 37*  BILITOT 0.5 0.4 1.3*   PT/INR No results for input(s): LABPROT, INR in the last 72 hours. Hepatitis Panel No results for input(s): HEPBSAG, HCVAB, HEPAIGM, HEPBIGM in the last 72 hours.   Studies/Results: US Abdomen Limited Ruq  Result Date: 05/10/2018 CLINICAL DATA:  Acute pancreatitis EXAM: ULTRASOUND ABDOMEN LIMITED RIGHT UPPER QUADRANT COMPARISON:  CT abdomen pelvis May 10, 2018. FINDINGS: Gallbladder: No wall thickening visualized. There is probably sludge in the gallbladder. No sonographic Murphy sign noted by sonographer. Common bile duct: Diameter: 4 mm Liver: No focal lesion identified. There is diffuse increased echotexture of the liver. Portal vein is patent on color Doppler imaging with normal direction of blood flow towards the liver. CT noted pancreatic pseudocyst is identified on ultrasound measuring 6 x 3.2 x 4.8 cm. IMPRESSION: No ultrasound evidence of acute cholecystitis. Fatty infiltration of liver. Pancreatic pseudocyst identified. Electronically Signed   By: Sherian Rein M.D.   On: 05/10/2018 15:12    Assessment: 27 year old gentleman with recurrent acute pancreatitis with known pseudocyst.  Patient's first episode of pancreatitis was around age 74 prior to any alcohol use.  Etiology unclear, possible idiopathic pancreatitis per patient.  Work-up was at [facility]  Records not available.  He has been hospitalized at least 5 times now, one episode complicated by SIRS 2017.  Last hospitalization December 2018.  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  Back in December  the plans were for possible EUS with drainage but this did not occur.  Patient never received appointment date and states that he began feeling better so he just left alone.    IgG subclass 4 slightly elevated at 106, previously 114 upper limits of normal 96.  Unclear significance. Has had prior alcohol use sometimes around episodes of  pancreatitis.  His gallbladder remains in situ as well.  His diet was advanced yesterday and he has tolerated this well. Pain is persistent but significantly improved. No N/V. No worsening pain with food. Referral has been sent to [facility], awaiting scheduling. Discussed concerns about Creon dosing, reviewed calculations with the patient and he now understands the need for Creon 36,000 units x 3 pills with each meal. He has enough Creon at ome for this, has a patient assistance/copay card; will call if he needs a refill. No other GI issues.  Plan: 1. Creon 108,000 units with each meal 2. Continue soft/low fat diet 3. Monitor for any worsening of symptoms 4. Can recheck Lipase tomorrow morning 5. Imperative to follow-up at [facility] for further workup of chronic pancreatitis with persistent pseudocyst. 6. Supportive measures   Thank you for allowing [place] to participate in the care of Nathaniel Hoar, DNP, AGNP-C Adult & Gerontological Nurse Practitioner [facility]     LOS: 2 days    05/12/2018, 7:59 AM

## 2018-05-16 DIAGNOSIS — Z6841 Body Mass Index (BMI) 40.0 and over, adult: Secondary | ICD-10-CM | POA: Diagnosis not present

## 2018-05-16 DIAGNOSIS — Z0000 Encounter for general adult medical examination without abnormal findings: Secondary | ICD-10-CM | POA: Diagnosis not present

## 2018-05-30 DIAGNOSIS — K863 Pseudocyst of pancreas: Secondary | ICD-10-CM | POA: Diagnosis not present

## 2018-06-12 DIAGNOSIS — K863 Pseudocyst of pancreas: Secondary | ICD-10-CM | POA: Diagnosis not present

## 2018-06-13 ENCOUNTER — Telehealth: Payer: Self-pay | Admitting: Gastroenterology

## 2018-06-13 MED ORDER — PANCRELIPASE (LIP-PROT-AMYL) 36000-114000 UNITS PO CPEP: 108000.0000 [IU] | ORAL_CAPSULE | Freq: Three times a day (TID) | ORAL | 5 refills | Status: DC

## 2018-06-13 NOTE — Addendum Note (Signed)
Addended by: Gelene Mink on: 06/13/2018 04:18 PM   Modules accepted: Orders

## 2018-06-13 NOTE — Telephone Encounter (Signed)
Done

## 2018-06-13 NOTE — Telephone Encounter (Signed)
Pt needs a refill of Creon 36,000 units called into [facility] on [address]

## 2018-06-13 NOTE — Telephone Encounter (Signed)
Routing message to [facility] refill 

## 2018-06-24 ENCOUNTER — Other Ambulatory Visit: Payer: Self-pay | Admitting: Internal Medicine

## 2018-06-26 NOTE — Telephone Encounter (Signed)
Pt last saw Dr Johney Frame 05/05/18, last labs 05/12/18 Creat 0.70, age 27, weight 152kg, based on specified criteria pt is on appropriate dosage of Eliquis 5mg  BID.  Will refill rx.

## 2018-07-04 DIAGNOSIS — Z8719 Personal history of other diseases of the digestive system: Secondary | ICD-10-CM | POA: Diagnosis not present

## 2018-07-04 DIAGNOSIS — K863 Pseudocyst of pancreas: Secondary | ICD-10-CM | POA: Diagnosis not present

## 2018-07-04 DIAGNOSIS — K760 Fatty (change of) liver, not elsewhere classified: Secondary | ICD-10-CM | POA: Diagnosis not present

## 2018-07-06 ENCOUNTER — Other Ambulatory Visit: Payer: Self-pay | Admitting: Internal Medicine

## 2018-07-11 DIAGNOSIS — K863 Pseudocyst of pancreas: Secondary | ICD-10-CM | POA: Diagnosis not present

## 2018-08-15 ENCOUNTER — Ambulatory Visit: Payer: BLUE CROSS/BLUE SHIELD | Admitting: Gastroenterology

## 2018-08-16 DIAGNOSIS — I10 Essential (primary) hypertension: Secondary | ICD-10-CM | POA: Diagnosis not present

## 2018-08-16 DIAGNOSIS — I4891 Unspecified atrial fibrillation: Secondary | ICD-10-CM | POA: Diagnosis not present

## 2018-08-16 DIAGNOSIS — E662 Morbid (severe) obesity with alveolar hypoventilation: Secondary | ICD-10-CM | POA: Diagnosis not present

## 2018-08-16 DIAGNOSIS — K860 Alcohol-induced chronic pancreatitis: Secondary | ICD-10-CM | POA: Diagnosis not present

## 2018-08-17 ENCOUNTER — Ambulatory Visit: Payer: BLUE CROSS/BLUE SHIELD | Admitting: Gastroenterology

## 2018-08-17 DIAGNOSIS — K859 Acute pancreatitis without necrosis or infection, unspecified: Secondary | ICD-10-CM | POA: Diagnosis not present

## 2018-08-17 DIAGNOSIS — Q451 Annular pancreas: Secondary | ICD-10-CM | POA: Diagnosis not present

## 2018-08-17 DIAGNOSIS — Q453 Other congenital malformations of pancreas and pancreatic duct: Secondary | ICD-10-CM | POA: Diagnosis not present

## 2018-08-17 DIAGNOSIS — K863 Pseudocyst of pancreas: Secondary | ICD-10-CM | POA: Diagnosis not present

## 2018-08-24 ENCOUNTER — Telehealth: Payer: Self-pay | Admitting: Internal Medicine

## 2018-08-24 NOTE — Telephone Encounter (Signed)
Call placed to [facility] - appointment given for Monday, 08/28/18 at 11:30.    Patient made aware of appointment & clinic phone number given [phone]).  He may call there tomorrow if have further questions prior to Monday.    Patient appreciative of call.

## 2018-08-24 NOTE — Telephone Encounter (Signed)
Been bothering x last few weeks.  Trying to eat low fat diet, no energy drinks or caffeine currently. Does dip, but that is not new.  Seems to be noticeable with sitting down to eat or just with sitting.  States that he feels like he is in AFib during those times.  Feels like after he comes out of the AFib, his heart rate slow down some.  Does notice SOB and dizziness during the episodes.  States that pmd did give him something for anxiety, but nothing too strong. Can not recall the name right now.  Standing up does not seem to bother him so much.

## 2018-08-24 NOTE — Telephone Encounter (Signed)
Has been having a lot of issues with AFIB especially when he is eating

## 2018-08-28 ENCOUNTER — Ambulatory Visit (HOSPITAL_COMMUNITY): Payer: BLUE CROSS/BLUE SHIELD | Admitting: Nurse Practitioner

## 2018-09-01 ENCOUNTER — Other Ambulatory Visit: Payer: Self-pay | Admitting: Internal Medicine

## 2018-09-20 ENCOUNTER — Emergency Department (HOSPITAL_COMMUNITY): Payer: BLUE CROSS/BLUE SHIELD

## 2018-09-20 ENCOUNTER — Other Ambulatory Visit: Payer: Self-pay

## 2018-09-20 ENCOUNTER — Emergency Department (HOSPITAL_COMMUNITY)
Admission: EM | Admit: 2018-09-20 | Discharge: 2018-09-20 | Disposition: A | Discharge status: Home / Self Care | Payer: BLUE CROSS/BLUE SHIELD | Attending: Emergency Medicine | Admitting: Emergency Medicine

## 2018-09-20 ENCOUNTER — Encounter (HOSPITAL_COMMUNITY): Payer: Self-pay | Admitting: *Deleted

## 2018-09-20 DIAGNOSIS — Z7901 Long term (current) use of anticoagulants: Secondary | ICD-10-CM | POA: Insufficient documentation

## 2018-09-20 DIAGNOSIS — R002 Palpitations: Secondary | ICD-10-CM | POA: Diagnosis not present

## 2018-09-20 DIAGNOSIS — R079 Chest pain, unspecified: Secondary | ICD-10-CM | POA: Diagnosis present

## 2018-09-20 DIAGNOSIS — I5022 Chronic systolic (congestive) heart failure: Secondary | ICD-10-CM | POA: Diagnosis not present

## 2018-09-20 DIAGNOSIS — I110 Hypertensive heart disease with heart failure: Secondary | ICD-10-CM | POA: Diagnosis not present

## 2018-09-20 DIAGNOSIS — Z87891 Personal history of nicotine dependence: Secondary | ICD-10-CM | POA: Insufficient documentation

## 2018-09-20 DIAGNOSIS — Z79899 Other long term (current) drug therapy: Secondary | ICD-10-CM | POA: Insufficient documentation

## 2018-09-20 LAB — CBC
HCT: 41.9 % (ref 39.0–52.0)
Hemoglobin: 13.7 g/dL (ref 13.0–17.0)
MCH: 29.1 pg (ref 26.0–34.0)
MCHC: 32.7 g/dL (ref 30.0–36.0)
MCV: 89.1 fL (ref 80.0–100.0)
Platelets: 299 10*3/uL (ref 150–400)
RBC: 4.7 MIL/uL (ref 4.22–5.81)
RDW: 12.6 % (ref 11.5–15.5)
WBC: 6.2 10*3/uL (ref 4.0–10.5)
nRBC: 0 % (ref 0.0–0.2)

## 2018-09-20 LAB — TROPONIN I: Troponin I: <0.03 ng/mL (ref <0.03)

## 2018-09-20 LAB — BASIC METABOLIC PANEL
Anion gap: 8 (ref 5–15)
BUN: 14 mg/dL (ref 6–20)
CO2: 22 mmol/L (ref 22–32)
Calcium: 8.8 mg/dL — ABNORMAL LOW (ref 8.9–10.3)
Chloride: 106 mmol/L (ref 98–111)
Creatinine, Ser: 0.69 mg/dL (ref 0.61–1.24)
GFR calc Af Amer: >60 mL/min (ref >60)
GFR calc non Af Amer: >60 mL/min (ref >60)
Glucose, Bld: 109 mg/dL — ABNORMAL HIGH (ref 70–99)
Potassium: 3.8 mmol/L (ref 3.5–5.1)
Sodium: 136 mmol/L (ref 135–145)

## 2018-09-20 MED ORDER — SODIUM CHLORIDE 0.9% FLUSH: 3.0000 mL | Freq: Once | INTRAVENOUS | Status: DC

## 2018-09-20 NOTE — ED Provider Notes (Addendum)
[facility] EMERGENCY DEPARTMENT Provider Note   CSN: [id number] Arrival date & time: 09/20/18  1901     History   Chief Complaint Chief Complaint  Patient presents with  . Chest Pain    HPI Nathaniel FilbertJonathan [name] is a 28 y.o. male.  Patient complains of palpitations and chest discomfort.  Patient has a history of cardiomyopathy and atrial fib he takes Toprol and flecainide.  Patient not having any discomfort or palpitations now  The history is provided by the patient.  Chest Pain  Pain location:  Substernal area Pain quality: aching   Pain severity:  Mild Onset quality:  Sudden Timing:  Intermittent Progression:  Waxing and waning Chronicity:  New Context: not breathing   Relieved by:  Nothing Worsened by:  Nothing Ineffective treatments:  None tried Associated symptoms: no abdominal pain, no back pain, no cough, no fatigue and no headache     Past Medical History:  Diagnosis Date  . Atrial flutter (HCC) 01/17/2016   Admx 5/17 with AFlutter with RVR in setting of pancreatitis // CHADS2-VASc=1 (CHF) // Anticoag not started // Amiodarone >> NSR   . Chronic lower back pain   . Chronic systolic CHF (congestive heart failure) (HCC)    Echo 01/17/16 - EF 35-40%, diffuse HK, mild LAE   . 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HCC) 02/03/2016   a. EF 35-40% in setting of AFlutter with RVR in 5/17 // probably tachycardia mediated // needs FU echo 2-3 mos after NSR restored // b. Limited Echo 7/17: EF 45-50%, diff HK, mild LAE  . Depression    "last 2-3 months; never treated for it" (12/09/2016)  . Fatty liver 12/2015   per CT  . Headache    "monthly" (12/09/2016)  . Obesity    BMI 45 12/2015  . Pancreatitis 2012   recurrent acute. [facility], [facility] ct then shows pseudocyst.   . Pancreatitis 2012 X 2; 2017 X 2  . Paroxysmal atrial fibrillation (HCC)   . Typical atrial flutter (HCC) 06/06/2017    Patient Active Problem List   Diagnosis Date Noted  .  Pancreatic divisum 05/11/2018  . Acute pancreatitis 05/10/2018  . Hypertension 05/10/2018  . Acute on chronic pancreatitis (HCC) 08/06/2017  . Obesity, Class III, BMI 40-49.9 (morbid obesity) (HCC) 08/06/2017  . Gastroenteritis 06/05/2017  . History of cardiomyopathy 06/05/2017  . Hypokalemia 06/05/2017  . Hypomagnesemia 06/05/2017  . Paroxysmal atrial fibrillation (HCC) 12/09/2016  . A-fib (HCC) 12/09/2016  . Pancreatitis 03/05/2016  . Acute pancreatitis without infection or necrosis 03/05/2016  . 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HCC) 02/03/2016  . Chronic systolic CHF (congestive heart failure) (HCC)   . Pancreatic pseudocyst   . Morbid obesity due to excess calories (HCC)   . Dyslipidemia   . History of pancreatitis 01/17/2016  . Snoring 01/17/2016  . Former smoker 01/17/2016  . 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 Atrial flutter (HCC) 01/17/2016    Past Surgical History:  Procedure Laterality Date  . ATRIAL FIBRILLATION ABLATION N/A 12/09/2016   Procedure: Atrial Fibrillation Ablation;  Surgeon: Hillis RangeJames Allred, MD;  [facility];  Service: Cardiovascular;  Laterality: N/A;  . FRACTURE SURGERY    . HAND SURGERY Right ~ 2008   "had pins in it; pulled them"  . TEE WITHOUT CARDIOVERSION N/A 12/08/2016   Procedure: TRANSESOPHAGEAL ECHOCARDIOGRAM (TEE);  Surgeon: Jake BatheMark C Skains, MD;  [facility];  Service: Cardiovascular;  Laterality: N/A;  . TONSILLECTOMY AND ADENOIDECTOMY     "  in my teens"        Home Medications    Prior to Admission medications   Medication Sig Start Date End Date Taking? Authorizing Provider  Cholecalciferol (VITAMIN D3) 1000 units CAPS Take 1,000 Units by mouth every evening.    [provider]  ELIQUIS 5 MG TABS tablet TAKE 1 TABLET BY MOUTH TWICE DAILY 06/26/18   Allred, Fayrene Fearing, MD  flecainide (TAMBOCOR) 100 MG tablet TAKE 1 TABLET(100 MG) BY MOUTH TWICE DAILY 09/04/18   Allred, Fayrene Fearing, MD  HYDROcodone-acetaminophen  (NORCO/VICODIN) 5-325 MG tablet Take 2 tablets by mouth every 6 (six) hours as needed. Patient taking differently: Take 2 tablets by mouth every 6 (six) hours as needed for moderate pain.  08/06/17   Dhungel, Theda Belfast, MD  lipase/protease/amylase (CREON) 36000 UNITS CPEP capsule Take 3 capsules (108,000 Units total) by mouth 3 (three) times daily with meals. 06/13/18   Gelene Mink, NP  lisinopril (PRINIVIL,ZESTRIL) 5 MG tablet Take 1 tablet (5 mg total) by mouth every evening. 07/07/18   Allred, Fayrene Fearing, MD  metoprolol succinate (TOPROL-XL) 25 MG 24 hr tablet TAKE 1 TABLET(25 MG) BY MOUTH DAILY Patient taking differently: Take 25 mg by mouth every evening.  04/10/18   Allred, Fayrene Fearing, MD  triamcinolone cream (KENALOG) 0.1 % Apply 1 application topically daily as needed (for irritation).     [provider]    Family History Family History  Problem Relation Age of Onset  . Diabetes Mother     Social History Social History   Tobacco Use  . Smoking status: Former Smoker    Packs/day: 0.50    Types: Cigarettes  . Smokeless tobacco: Current User    Types: Snuff  Substance Use Topics  . Alcohol use: Not Currently    Comment: occasional  . Drug use: Not Currently    Types: Marijuana    Comment: 12/09/2016 "daily from age 12 to 38"     Allergies   Patient has no known allergies.   Review of Systems Review of Systems  Constitutional: Negative for appetite change and fatigue.  HENT: Negative for congestion, ear discharge and sinus pressure.   Eyes: Negative for discharge.  Respiratory: Negative for cough.   Cardiovascular: Positive for chest pain.  Gastrointestinal: Negative for abdominal pain and diarrhea.  Genitourinary: Negative for frequency and hematuria.  Musculoskeletal: Negative for back pain.  Skin: Negative for rash.  Neurological: Negative for seizures and headaches.  Psychiatric/Behavioral: Negative for hallucinations.     Physical Exam Updated Vital  Signs BP (!) 111/52 (BP Location: Right Arm)   Pulse 78   Temp (!) 97.4 F (36.3 C) (Oral)   Resp 16   Ht 6' (1.829 m)   Wt (!) 141.5 kg   SpO2 95%   BMI 42.31 kg/m   Physical Exam Vitals signs and nursing note reviewed.  Constitutional:      Appearance: He is well-developed.  HENT:     Head: Normocephalic.     Nose: Nose normal.  Eyes:     General: No scleral icterus.    Conjunctiva/sclera: Conjunctivae normal.  Neck:     Musculoskeletal: Neck supple.     Thyroid: No thyromegaly.  Cardiovascular:     Rate and Rhythm: Normal rate and regular rhythm.     Heart sounds: No murmur. No friction rub. No gallop.   Pulmonary:     Breath sounds: No stridor. No wheezing or rales.  Chest:     Chest wall: No tenderness.  Abdominal:  General: There is no distension.     Tenderness: There is no abdominal tenderness. There is no rebound.  Musculoskeletal: Normal range of motion.  Lymphadenopathy:     Cervical: No cervical adenopathy.  Skin:    Findings: No erythema or rash.  Neurological:     Mental Status: He is oriented to person, place, and time.     Motor: No abnormal muscle tone.     Coordination: Coordination normal.  Psychiatric:        Behavior: Behavior normal.     EKG Interpretation  Date/Time:    Ventricular Rate:    PR Interval:    QRS Duration:   QT Interval:    QTC Calculation:   R Axis:     Text Interpretation:           ED Treatments / Results  Labs (all labs ordered are listed, but only abnormal results are displayed) Labs Reviewed  BASIC METABOLIC PANEL - Abnormal; Notable for the following components:      Result Value   Glucose, Bld 109 (*)    Calcium 8.8 (*)    All other components within normal limits  CBC  TROPONIN I    EKG None  Radiology Dg Chest 2 View  Result Date: 09/20/2018 CLINICAL DATA:  Left-sided chest pain radiating to the shoulder with dyspnea. EXAM: CHEST - 2 VIEW COMPARISON:  06/04/2017 FINDINGS: The heart  size and mediastinal contours are within normal limits. Both lungs are clear. The visualized skeletal structures are unremarkable. IMPRESSION: No active cardiopulmonary disease. Electronically Signed   By: Tollie Eth M.D.   On: 09/20/2018 19:32    Procedures Procedures (including critical care time)  Medications Ordered in ED Medications  sodium chloride flush (NS) 0.9 % injection 3 mL (has no administration in time range)     Initial Impression / Assessment and Plan / ED Course  I have reviewed the triage vital signs and the nursing notes.  Pertinent labs & imaging results that were available during my care of the patient were reviewed by me and considered in my medical decision making (see chart for details).     Labs unremarkable chest x-ray and EKG unremarkable.  Patient with cardiomyopathy and palpitations on flecainide and Toprol.  He was instructed to take an extra Toprol if he has palpitations that will not go away in a few minutes.  He will follow-up with his cardiologist  Final Clinical Impressions(s) / ED Diagnoses   Final diagnoses:  Palpitations    ED Discharge Orders    None       Bethann Berkshire, MD 09/20/18 0938    Bethann Berkshire, MD 09/30/18 1125

## 2018-09-20 NOTE — ED Triage Notes (Signed)
Pt c/o mid chest pain that radiates to left side of chest; pt describes the pain as a lot of pressure; pt was not doing anything extraneous when the pain started

## 2018-09-20 NOTE — Discharge Instructions (Addendum)
Take an extra Toprol if you have palpitations occur will not go away.  And follow-up next week with your cardiologist

## 2018-09-20 NOTE — ED Notes (Signed)
ED Provider at bedside. 

## 2018-09-22 ENCOUNTER — Ambulatory Visit (HOSPITAL_COMMUNITY)
Admission: RE | Admit: 2018-09-22 | Discharge: 2018-09-22 | Disposition: A | Discharge status: Home / Self Care | Payer: BLUE CROSS/BLUE SHIELD | Source: Ambulatory Visit | Attending: Nurse Practitioner | Admitting: Nurse Practitioner

## 2018-09-22 ENCOUNTER — Encounter (HOSPITAL_COMMUNITY): Payer: Self-pay | Admitting: Nurse Practitioner

## 2018-09-22 ENCOUNTER — Ambulatory Visit (HOSPITAL_COMMUNITY)
Admission: RE | Admit: 2018-09-22 | Discharge: 2018-09-22 | Disposition: A | Discharge status: Home / Self Care | Payer: BLUE CROSS/BLUE SHIELD | Source: Ambulatory Visit | Admitting: Nurse Practitioner

## 2018-09-22 ENCOUNTER — Other Ambulatory Visit: Payer: Self-pay

## 2018-09-22 VITALS — BP 136/84 | HR 77 | Ht 72.0 in | Wt 312.0 lb

## 2018-09-22 DIAGNOSIS — I5022 Chronic systolic (congestive) heart failure: Secondary | ICD-10-CM | POA: Insufficient documentation

## 2018-09-22 DIAGNOSIS — Z7901 Long term (current) use of anticoagulants: Secondary | ICD-10-CM | POA: Insufficient documentation

## 2018-09-22 DIAGNOSIS — I484 Atypical atrial flutter: Secondary | ICD-10-CM | POA: Diagnosis not present

## 2018-09-22 DIAGNOSIS — R002 Palpitations: Secondary | ICD-10-CM | POA: Diagnosis not present

## 2018-09-22 DIAGNOSIS — Z87891 Personal history of nicotine dependence: Secondary | ICD-10-CM | POA: Insufficient documentation

## 2018-09-22 DIAGNOSIS — I480 Paroxysmal atrial fibrillation: Secondary | ICD-10-CM | POA: Insufficient documentation

## 2018-09-22 DIAGNOSIS — Z79899 Other long term (current) drug therapy: Secondary | ICD-10-CM | POA: Insufficient documentation

## 2018-09-22 NOTE — Progress Notes (Signed)
Primary Care Physician: Toma Deiters, MD Referring Physician: Dr. Porfirio Oar [name] is a 28 y.o. male with a h/o paroxysmal afib that is in the afib clinic for increased palpitations. He had a afib ablation in 2018. He has been maintained with flecainide and metoprolol. Is on eliquis 5 mg bid for CHA2DS2VASc score of 1. He has noted increased palpitations over the last several weeks. Went to the ER recently for palpitations but found to be in SR. Palpitations "come and go." He  states that he has struggled with increased anxiety recently. He is interested in redo ablation if needed. States that he has chest discomfort with the palpitations but not with normal exertional activity.  Today, he denies symptoms of  shortness of breath, orthopnea, PND, lower extremity edema, dizziness, presyncope, syncope, or neurologic sequela. The patient is tolerating medications without difficulties and is otherwise without complaint today.   Past Medical History:  Diagnosis Date  . Atrial flutter (HCC) 01/17/2016   Admx 5/17 with AFlutter with RVR in setting of pancreatitis // CHADS2-VASc=1 (CHF) // Anticoag not started // Amiodarone >> NSR   . Chronic lower back pain   . Chronic systolic CHF (congestive heart failure) (HCC)    Echo 01/17/16 - EF 35-40%, diffuse HK, mild LAE   . 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HCC) 02/03/2016   a. EF 35-40% in setting of AFlutter with RVR in 5/17 // probably tachycardia mediated // needs FU echo 2-3 mos after NSR restored // b. Limited Echo 7/17: EF 45-50%, diff HK, mild LAE  . Depression    "last 2-3 months; never treated for it" (12/09/2016)  . Fatty liver 12/2015   per CT  . Headache    "monthly" (12/09/2016)  . Obesity    BMI 45 12/2015  . Pancreatitis 2012   recurrent acute. Admissions to [facility], 12/2015 admissin to [facility] hospital, ct then shows pseudocyst.   . Pancreatitis 2012 X 2; 2017 X 2  . Paroxysmal atrial fibrillation (HCC)   . Typical atrial flutter (HCC)  06/06/2017   Past Surgical History:  Procedure Laterality Date  . ATRIAL FIBRILLATION ABLATION N/A 12/09/2016   Procedure: Atrial Fibrillation Ablation;  Surgeon: Nathaniel Range, MD;  Location: [facility] INVASIVE CV LAB;  Service: Cardiovascular;  Laterality: N/A;  . FRACTURE SURGERY    . HAND SURGERY Right ~ 2008   "had pins in it; pulled them"  . TEE WITHOUT CARDIOVERSION N/A 12/08/2016   Procedure: TRANSESOPHAGEAL ECHOCARDIOGRAM (TEE);  Surgeon: Jake Bathe, MD;  Location[facility] ENDOSCOPY;  Service: Cardiovascular;  Laterality: N/A;  . TONSILLECTOMY AND ADENOIDECTOMY     "in my teens"    Current Outpatient Medications  Medication Sig Dispense Refill  . Cholecalciferol (VITAMIN D3) 1000 units CAPS Take 1,000 Units by mouth every evening.    [name] ELIQUIS 5 MG TABS tablet TAKE 1 TABLET BY MOUTH TWICE DAILY 60 tablet 11  . flecainide (TAMBOCOR) 100 MG tablet TAKE 1 TABLET(100 MG) BY MOUTH TWICE DAILY 180 tablet 2  . lisinopril (PRINIVIL,ZESTRIL) 5 MG tablet Take 1 tablet (5 mg total) by mouth every evening. 90 tablet 2  . metoprolol succinate (TOPROL-XL) 25 MG 24 hr tablet TAKE 1 TABLET(25 MG) BY MOUTH DAILY (Patient taking differently: Take 25 mg by mouth every evening. ) 90 tablet 3  . triamcinolone cream (KENALOG) 0.1 % Apply 1 application topically daily as needed (for irritation).      No current facility-administered medications for this encounter.     No  Known Allergies  Social History   Socioeconomic History  . Marital status: Married    Spouse name: Not on file  . Number of children: 0  . Years of education: 70  . Highest education level: Not on file  Occupational History    Employer: [employer]  Social Needs  . Financial resource strain: Not on file  . Food insecurity:    Worry: Not on file    Inability: Not on file  . Transportation needs:    Medical: Not on file    Non-medical: Not on file  Tobacco Use  . Smoking status: Former Smoker    Packs/day: 0.50    Types:  Cigarettes  . Smokeless tobacco: Current User    Types: Snuff  Substance and Sexual Activity  . Alcohol use: Not Currently    Comment: occasional  . Drug use: Not Currently    Types: Marijuana    Comment: 12/09/2016 "daily from age 32 to 31"  . Sexual activity: Yes    Birth control/protection: None  Lifestyle  . Physical activity:    Days per week: Not on file    Minutes per session: Not on file  . Stress: Not on file  Relationships  . Social connections:    Talks on phone: Not on file    Gets together: Not on file    Attends religious service: Not on file    Active member of club or organization: Not on file    Attends meetings of clubs or organizations: Not on file    Relationship status: Not on file  . Intimate partner violence:    Fear of current or ex partner: Not on file    Emotionally abused: Not on file    Physically abused: Not on file    Forced sexual activity: Not on file  Other Topics Concern  . Not on file  Social History Narrative   Pt lives in [place] with fiancee.  Works at [employer] in [place].    Family History  Problem Relation Age of Onset  . Diabetes Mother     ROS- All systems are reviewed and negative except as per the HPI above  Physical Exam: Vitals:   09/22/18 1138  BP: 136/84  Pulse: 77  Weight: (!) 141.5 kg  Height: 6' (1.829 m)   Wt Readings from Last 3 Encounters:  09/22/18 (!) 141.5 kg  09/20/18 (!) 141.5 kg  05/10/18 (!) 149.9 kg    Labs: Lab Results  Component Value Date   NA 136 09/20/2018   K 3.8 09/20/2018   CL 106 09/20/2018   CO2 22 09/20/2018   GLUCOSE 109 (H) 09/20/2018   BUN 14 09/20/2018   CREATININE 0.69 09/20/2018   CALCIUM 8.8 (L) 09/20/2018   PHOS 3.8 05/09/2018   MG 2.1 05/09/2018   Lab Results  Component Value Date   INR 1.22 01/18/2016   Lab Results  Component Value Date   CHOL 116 05/11/2018   HDL 31 (L) 05/11/2018   LDLCALC 73 05/11/2018   TRIG 61 05/11/2018     GEN- The patient is  well appearing, alert and oriented x 3 today.   Head- normocephalic, atraumatic Eyes-  Sclera clear, conjunctiva pink Ears- hearing intact Oropharynx- clear Neck- supple, no JVP Lymph- no cervical lymphadenopathy Lungs- Clear to ausculation bilaterally, normal work of breathing Heart- Regular rate and rhythm, no murmurs, rubs or gallops, PMI not laterally displaced GI- soft, NT, ND, + BS Extremities- no clubbing, cyanosis, or edema MS-  no significant deformity or atrophy Skin- no rash or lesion Psych- euthymic mood, full affect Neuro- strength and sensation are intact  EKG- NSR, normal ekg Epic records reviewed   Assessment and Plan: 1. afib S/p ablation in 2018 Has had increase in palpitations recently Has also noted increase in anxiety Denies any alcohol, tobacco or excessive caffeine use Will place a Zio patch x one week Will review with Dr.Allred on return to see if repeat ablation in needed For now continue flecainide 100 mg bid and metoprolol 25 mg bid  Continue  eliquis 5 mg bid  F/u per monitor  Lupita Leashonna C. Matthew Folksarroll, ANP-C Afib Clinic [facility] [address] [place], [place] [address] [phone]

## 2018-09-22 NOTE — Patient Instructions (Addendum)
Your physician has recommended that you wear a heart monitor for 1 week.  We will contact you once we receive the results.

## 2018-10-03 ENCOUNTER — Ambulatory Visit (HOSPITAL_COMMUNITY)
Admission: RE | Admit: 2018-10-03 | Discharge: 2018-10-03 | Disposition: A | Discharge status: Home / Self Care | Payer: BLUE CROSS/BLUE SHIELD | Source: Ambulatory Visit | Attending: Nurse Practitioner | Admitting: Nurse Practitioner

## 2018-10-03 DIAGNOSIS — I484 Atypical atrial flutter: Secondary | ICD-10-CM

## 2018-10-11 DIAGNOSIS — I5032 Chronic diastolic (congestive) heart failure: Secondary | ICD-10-CM | POA: Diagnosis not present

## 2018-10-11 DIAGNOSIS — K860 Alcohol-induced chronic pancreatitis: Secondary | ICD-10-CM | POA: Diagnosis not present

## 2018-10-11 DIAGNOSIS — I10 Essential (primary) hypertension: Secondary | ICD-10-CM | POA: Diagnosis not present

## 2018-10-11 DIAGNOSIS — I4891 Unspecified atrial fibrillation: Secondary | ICD-10-CM | POA: Diagnosis not present

## 2018-10-12 ENCOUNTER — Encounter (HOSPITAL_COMMUNITY): Payer: Self-pay | Admitting: *Deleted

## 2018-10-20 ENCOUNTER — Telehealth (HOSPITAL_COMMUNITY): Payer: Self-pay | Admitting: *Deleted

## 2018-10-20 NOTE — Telephone Encounter (Signed)
Pt cld to go over monitor results.  He was given results and recommendations from Lupita Leash about a discussion with Allred re antiarrhythmic vs repeat ablation.  Pt understood

## 2018-10-31 DIAGNOSIS — K863 Pseudocyst of pancreas: Secondary | ICD-10-CM | POA: Diagnosis not present

## 2018-11-03 ENCOUNTER — Ambulatory Visit (INDEPENDENT_AMBULATORY_CARE_PROVIDER_SITE_OTHER): Payer: BLUE CROSS/BLUE SHIELD | Admitting: Internal Medicine

## 2018-11-03 ENCOUNTER — Encounter: Payer: Self-pay | Admitting: Internal Medicine

## 2018-11-03 ENCOUNTER — Encounter: Payer: Self-pay | Admitting: *Deleted

## 2018-11-03 VITALS — BP 110/68 | HR 77 | Ht 71.0 in | Wt 303.0 lb

## 2018-11-03 DIAGNOSIS — R079 Chest pain, unspecified: Secondary | ICD-10-CM | POA: Diagnosis not present

## 2018-11-03 DIAGNOSIS — I480 Paroxysmal atrial fibrillation: Secondary | ICD-10-CM

## 2018-11-03 NOTE — Progress Notes (Signed)
PCP: Toma Deiters, MD   Primary EP: Dr Johney Frame  Nathaniel [name] is a 28 y.o. male who presents today for routine electrophysiology followup.  Since last being seen in our clinic, the patient reports doing very well.  He has had chest pain, mostly at rest but at times with exertion.  Recent ZIo revealed ongoing afib episodes.   Today, he denies symptoms of  shortness of breath,  lower extremity edema, dizziness, presyncope, or syncope.  The patient is otherwise without complaint today.   Past Medical History:  Diagnosis Date  . Atrial flutter (HCC) 01/17/2016   Admx 5/17 with AFlutter with RVR in setting of pancreatitis // CHADS2-VASc=1 (CHF) // Anticoag not started // Amiodarone >> NSR   . Chronic lower back pain   . Chronic systolic CHF (congestive heart failure) (HCC)    Echo 01/17/16 - EF 35-40%, diffuse HK, mild LAE   . 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HCC) 02/03/2016   a. EF 35-40% in setting of AFlutter with RVR in 5/17 // probably tachycardia mediated // needs FU echo 2-3 mos after NSR restored // b. Limited Echo 7/17: EF 45-50%, diff HK, mild LAE  . Depression    "last 2-3 months; never treated for it" (12/09/2016)  . Fatty liver 12/2015   per CT  . Headache    "monthly" (12/09/2016)  . Obesity    BMI 45 12/2015  . Pancreatitis 2012   recurrent acute. Admissions to [facility], 12/2015 admissin to [facility] hospital, ct then shows pseudocyst.   . Pancreatitis 2012 X 2; 2017 X 2  . Paroxysmal atrial fibrillation (HCC)   . Typical atrial flutter (HCC) 06/06/2017   Past Surgical History:  Procedure Laterality Date  . ATRIAL FIBRILLATION ABLATION N/A 12/09/2016   Procedure: Atrial Fibrillation Ablation;  Surgeon: Hillis Range, MD;  Location: [facility] INVASIVE CV LAB;  Service: Cardiovascular;  Laterality: N/A;  . FRACTURE SURGERY    . HAND SURGERY Right ~ 2008   "had pins in it; pulled them"  . TEE WITHOUT CARDIOVERSION N/A 12/08/2016   Procedure: TRANSESOPHAGEAL ECHOCARDIOGRAM (TEE);  Surgeon: Jake Bathe, MD;  Location: [facility] ENDOSCOPY;  Service: Cardiovascular;  Laterality: N/A;  . TONSILLECTOMY AND ADENOIDECTOMY     "in my teens"    ROS- all systems are reviewed and negatives except as per HPI above  Current Outpatient Medications  Medication Sig Dispense Refill  . Cholecalciferol (VITAMIN D3) 1000 units CAPS Take 1,000 Units by mouth every evening.    . cyclobenzaprine (FLEXERIL) 10 MG tablet Take 10 mg by mouth 3 (three) times daily as needed for muscle spasms.    [name] ELIQUIS 5 MG TABS tablet TAKE 1 TABLET BY MOUTH TWICE DAILY 60 tablet 11  . flecainide (TAMBOCOR) 100 MG tablet TAKE 1 TABLET(100 MG) BY MOUTH TWICE DAILY 180 tablet 2  . lisinopril (PRINIVIL,ZESTRIL) 5 MG tablet Take 1 tablet (5 mg total) by mouth every evening. 90 tablet 2  . metoprolol succinate (TOPROL-XL) 25 MG 24 hr tablet TAKE 1 TABLET(25 MG) BY MOUTH DAILY (Patient taking differently: Take 25 mg by mouth every evening. ) 90 tablet 3  . triamcinolone cream (KENALOG) 0.1 % Apply 1 application topically daily as needed (for irritation).      No current facility-administered medications for this visit.     Physical Exam: Vitals:   11/03/18 0838  BP: 110/68  Pulse: 77  SpO2: 97%  Weight: (!) 303 lb (137.4 kg)  Height: 5\' 11"  (1.803 m)  GEN- The patient is well appearing, alert and oriented x 3 today.   Head- normocephalic, atraumatic Eyes-  Sclera clear, conjunctiva pink Ears- hearing intact Oropharynx- clear Lungs- Clear to ausculation bilaterally, normal work of breathing Heart- Regular rate and rhythm, no murmurs, rubs or gallops, PMI not laterally displaced GI- soft, NT, ND, + BS Extremities- no clubbing, cyanosis, or edema  Wt Readings from Last 3 Encounters:  11/03/18 (!) 303 lb (137.4 kg)  09/22/18 (!) 312 lb (141.5 kg)  09/20/18 (!) 312 lb (141.5 kg)    EKG tracing ordered today is personally reviewed and shows sinus , no ischemic symptoms  Assessment and Plan:  1. Paroxysmal atrial  fibrillation/ atrial flutter Doing well Lifestyle modification encouraged.  He has worked diligently on this chads2vasc score is 1. Continue current meds  2. Obesity Body mass index is 42.26 kg/m. Continues   3. Pervious tachycardia mediated CM Improved Repeat echo  4. Chest pain Both typical and atypical features GXT is ordered Hold toprol 48 hours prior,  Do not hold flecainide  Return in 3 months  Hillis Range MD, [facility] 11/03/2018 9:16 AM

## 2018-11-03 NOTE — Patient Instructions (Signed)
Medication Instructions:  Continue all current medications.   Testing/Procedures:  Your physician has requested that you have an echocardiogram. Echocardiography is a painless test that uses sound waves to create images of your heart. It provides your doctor with information about the size and shape of your heart and how well your heart's chambers and valves are working. This procedure takes approximately one hour. There are no restrictions for this procedure.  Your physician has requested that you have an exercise tolerance test. For further information please visit [email or web address] Please also follow instruction sheet, as given.  Office will contact with results via phone or letter.    Follow-Up: 3 months   Any Other Special Instructions Will Be Listed Below (If Applicable).  If you need a refill on your cardiac medications before your next appointment, please call your pharmacy.

## 2018-11-05 DIAGNOSIS — R6889 Other general symptoms and signs: Secondary | ICD-10-CM | POA: Diagnosis not present

## 2018-11-13 ENCOUNTER — Telehealth: Payer: Self-pay | Admitting: Internal Medicine

## 2018-11-13 NOTE — Telephone Encounter (Signed)
Spoke to pt  CP is here or there he says.   Not much With corona virus concerns would recomm rescheduling for later this spring  (May ) if he is still having.  If gets worse I have told pt to call our clinic to let us know

## 2018-11-13 NOTE — Telephone Encounter (Signed)
Left msg. On VM I have  Reviewed notes   CP with atypical/typical features    Would like to postpone GXT sched for tomorrow with corona virus concerns. Told pt to call back ([place] office) or we would call him

## 2018-11-14 ENCOUNTER — Encounter (HOSPITAL_COMMUNITY): Payer: BLUE CROSS/BLUE SHIELD

## 2018-11-23 ENCOUNTER — Other Ambulatory Visit: Payer: BLUE CROSS/BLUE SHIELD

## 2018-12-07 ENCOUNTER — Encounter (HOSPITAL_COMMUNITY): Payer: BLUE CROSS/BLUE SHIELD

## 2019-01-04 ENCOUNTER — Other Ambulatory Visit: Payer: Self-pay

## 2019-01-04 ENCOUNTER — Ambulatory Visit (HOSPITAL_COMMUNITY)
Admission: RE | Admit: 2019-01-04 | Discharge: 2019-01-04 | Disposition: A | Discharge status: Home / Self Care | Payer: BLUE CROSS/BLUE SHIELD | Source: Ambulatory Visit | Attending: Internal Medicine | Admitting: Internal Medicine

## 2019-01-04 DIAGNOSIS — R079 Chest pain, unspecified: Secondary | ICD-10-CM | POA: Diagnosis not present

## 2019-01-04 LAB — EXERCISE TOLERANCE TEST
Estimated workload: 11.7 METS
Exercise duration (min): 9 min
Exercise duration (sec): 31 s
MPHR: 192 {beats}/min
Peak HR: 173 {beats}/min
Percent HR: 90 %
RPE: 17
Rest HR: 75 {beats}/min

## 2019-01-18 DIAGNOSIS — Z6841 Body Mass Index (BMI) 40.0 and over, adult: Secondary | ICD-10-CM | POA: Diagnosis not present

## 2019-01-18 DIAGNOSIS — I480 Paroxysmal atrial fibrillation: Secondary | ICD-10-CM | POA: Diagnosis not present

## 2019-01-18 DIAGNOSIS — K860 Alcohol-induced chronic pancreatitis: Secondary | ICD-10-CM | POA: Diagnosis not present

## 2019-01-18 DIAGNOSIS — I4891 Unspecified atrial fibrillation: Secondary | ICD-10-CM | POA: Diagnosis not present

## 2019-01-18 DIAGNOSIS — I5032 Chronic diastolic (congestive) heart failure: Secondary | ICD-10-CM | POA: Diagnosis not present

## 2019-01-18 DIAGNOSIS — I10 Essential (primary) hypertension: Secondary | ICD-10-CM | POA: Diagnosis not present

## 2019-01-25 ENCOUNTER — Ambulatory Visit (INDEPENDENT_AMBULATORY_CARE_PROVIDER_SITE_OTHER): Payer: BLUE CROSS/BLUE SHIELD

## 2019-01-25 DIAGNOSIS — I480 Paroxysmal atrial fibrillation: Secondary | ICD-10-CM | POA: Diagnosis not present

## 2019-01-25 DIAGNOSIS — M48061 Spinal stenosis, lumbar region without neurogenic claudication: Secondary | ICD-10-CM | POA: Diagnosis not present

## 2019-01-25 DIAGNOSIS — M4804 Spinal stenosis, thoracic region: Secondary | ICD-10-CM | POA: Diagnosis not present

## 2019-01-25 DIAGNOSIS — M549 Dorsalgia, unspecified: Secondary | ICD-10-CM | POA: Diagnosis not present

## 2019-02-02 ENCOUNTER — Telehealth: Payer: Self-pay

## 2019-02-07 ENCOUNTER — Telehealth: Payer: Self-pay | Admitting: *Deleted

## 2019-02-07 NOTE — Telephone Encounter (Signed)
Medications and allergies reviewed with patient  Unable to check vitals at home but will check them at work and give to provider during visit Awaiting consent via mychart-need to be confirmed

## 2019-02-09 ENCOUNTER — Telehealth (INDEPENDENT_AMBULATORY_CARE_PROVIDER_SITE_OTHER): Payer: BC Managed Care – PPO | Admitting: Internal Medicine

## 2019-02-09 VITALS — Wt 290.0 lb

## 2019-02-09 DIAGNOSIS — I480 Paroxysmal atrial fibrillation: Secondary | ICD-10-CM

## 2019-02-09 DIAGNOSIS — I10 Essential (primary) hypertension: Secondary | ICD-10-CM | POA: Diagnosis not present

## 2019-02-09 NOTE — Progress Notes (Signed)
Electrophysiology TeleHealth Note    Due to national recommendations of social distancing due to [place] 19, an audio telehealth visit is felt to be most appropriate for this patient at this time.  Verbal consent was obtained by me for the telehealth visit today.  The patient does not have capability for a virtual visit.  A phone visit is therefore required today.   Date:  02/09/2019   ID:  Nathaniel [name], DOB 07-May-1991, MRN [id number]  Location: patient's home  Provider location:  [facility]  Evaluation Performed: Follow-up visit  PCP:  Neale Burly, MD   Electrophysiologist:  Dr Rayann Heman  Chief Complaint:  AF follow up  History of Present Illness:    Nathaniel [name] is a 28 y.o. male who presents via telehealth conferencing today.  Since last being seen in our clinic, the patient reports doing relatively well. He continues to have periods of AF with associated fatigue.  Today, he denies symptoms of chest pain, shortness of breath,  lower extremity edema, dizziness, presyncope, or syncope.  The patient is otherwise without complaint today.  The patient denies symptoms of fevers, chills, cough, or new SOB worrisome for COVID 19.  Past Medical History:  Diagnosis Date  . Atrial flutter ([place]) 01/17/2016   Admx 5/17 with AFlutter with RVR in setting of pancreatitis // CHADS2-VASc=1 (CHF) // Anticoag not started // Amiodarone >> NSR   . Chronic lower back pain   . Chronic systolic CHF (congestive heart failure) ([place])    Echo 01/17/16 - EF 35-40%, diffuse HK, mild LAE   . 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[place]) 02/03/2016   a. EF 35-40% in setting of AFlutter with RVR in 5/17 // probably tachycardia mediated // needs FU echo 2-3 mos after NSR restored // b. Limited Echo 7/17: EF 45-50%, diff HK, mild LAE  . Depression    "last 2-3 months; never treated for it" (12/09/2016)  . Fatty liver 12/2015   per CT  . Headache    "monthly" (12/09/2016)  . Obesity    BMI 45 12/2015  .  Pancreatitis 2012   recurrent acute. Admissions to [facility], 12/2015 admissin to [facility] hospital, ct then shows pseudocyst.   . Pancreatitis 2012 X 2; 2017 X 2  . Paroxysmal atrial fibrillation (HCC)   . Typical atrial flutter ([place]) 06/06/2017    Past Surgical History:  Procedure Laterality Date  . ATRIAL FIBRILLATION ABLATION N/A 12/09/2016   Procedure: Atrial Fibrillation Ablation;  Surgeon: Thompson Grayer, MD;  Location: [place] CV LAB;  Service: Cardiovascular;  Laterality: N/A;  . FRACTURE SURGERY    . HAND SURGERY Right ~ 2008   "had pins in it; pulled them"  . TEE WITHOUT CARDIOVERSION N/A 12/08/2016   Procedure: TRANSESOPHAGEAL ECHOCARDIOGRAM (TEE);  Surgeon: Jerline Pain, MD;  Location: [facility] ENDOSCOPY;  Service: Cardiovascular;  Laterality: N/A;  . TONSILLECTOMY AND ADENOIDECTOMY     "in my teens"    Current Outpatient Medications  Medication Sig Dispense Refill  . Cholecalciferol (VITAMIN D3) 1000 units CAPS Take 1,000 Units by mouth every evening.    . cyclobenzaprine (FLEXERIL) 10 MG tablet Take 10 mg by mouth 3 (three) times daily as needed for muscle spasms.    [name] ELIQUIS 5 MG TABS tablet TAKE 1 TABLET BY MOUTH TWICE DAILY 60 tablet 11  . flecainide (TAMBOCOR) 100 MG tablet TAKE 1 TABLET(100 MG) BY MOUTH TWICE DAILY 180 tablet 2  . lisinopril (PRINIVIL,ZESTRIL) 5 MG tablet Take 1 tablet (5 mg  total) by mouth every evening. 90 tablet 2  . metoprolol succinate (TOPROL-XL) 25 MG 24 hr tablet TAKE 1 TABLET(25 MG) BY MOUTH DAILY (Patient taking differently: Take 25 mg by mouth every evening. ) 90 tablet 3  . triamcinolone cream (KENALOG) 0.1 % Apply 1 application topically daily as needed (for irritation).      No current facility-administered medications for this visit.     Allergies:   Patient has no known allergies.   Social History:  The patient  reports that he has quit smoking. His smoking use included cigarettes. He smoked 0.50 packs per day. His smokeless tobacco use includes  snuff. He reports previous alcohol use. He reports previous drug use. Drug: Marijuana.   Family History:  The patient's  family history includes Diabetes in his mother.   ROS:  Please see the history of present illness.   All other systems are personally reviewed and negative.    Exam:    Vital Signs:  Wt 290 lb (131.5 kg)   BMI 40.45 kg/m   Well sounding today   Labs/Other Tests and Data Reviewed:    Recent Labs: 05/09/2018: Magnesium 2.1 05/11/2018: ALT 19 09/20/2018: BUN 14; Creatinine, Ser 0.69; Hemoglobin 13.7; Platelets 299; Potassium 3.8; Sodium 136   Wt Readings from Last 3 Encounters:  02/09/19 290 lb (131.5 kg)  11/03/18 (!) 303 lb (137.4 kg)  09/22/18 (!) 312 lb (141.5 kg)       ASSESSMENT & PLAN:    1.  Paroxysmal atrial fibrillation/ atrial flutter CHADS2VASC is 0 Since starting Flecainide, he reports still having some periods of atrial fibrillation Discussed options for AF management. Risks, benefits to [facility] implant reviewed with patient who wishes to proceed.   2.  Obesity Lifestyle modification encouraged  3.  Previous tachycardia induced cardiomyopathy EF normalized with echo in May 2020  Follow-up:  Next week with ILR implant.    Patient Risk:  after full review of this patients clinical status, I feel that they are at moderate risk at this time.  Today, I have spent 15 minutes with the patient with telehealth technology discussing arrhythmia management .    Randolm Idol, MD  02/09/2019 8:57 AM     [facility] [address] [place] [address] [phone] (office) [phone] (fax)

## 2019-02-14 ENCOUNTER — Telehealth: Payer: Self-pay

## 2019-02-14 NOTE — Telephone Encounter (Signed)
Patient called back he cancelled his appt for 6/18.  He said he will callback to r/s when he is ready to proceed with the procedure.

## 2019-02-14 NOTE — Telephone Encounter (Signed)
Left message regarding appt on 02/15/19. 

## 2019-02-15 ENCOUNTER — Ambulatory Visit: Payer: BC Managed Care – PPO | Admitting: Internal Medicine

## 2019-03-28 ENCOUNTER — Emergency Department (HOSPITAL_COMMUNITY)
Admission: EM | Admit: 2019-03-28 | Discharge: 2019-03-28 | Disposition: A | Discharge status: Home / Self Care | Payer: BC Managed Care – PPO | Attending: Emergency Medicine | Admitting: Emergency Medicine

## 2019-03-28 ENCOUNTER — Encounter (HOSPITAL_COMMUNITY): Payer: Self-pay | Admitting: *Deleted

## 2019-03-28 ENCOUNTER — Other Ambulatory Visit: Payer: Self-pay

## 2019-03-28 DIAGNOSIS — R509 Fever, unspecified: Secondary | ICD-10-CM | POA: Diagnosis not present

## 2019-03-28 DIAGNOSIS — Z5321 Procedure and treatment not carried out due to patient leaving prior to being seen by health care provider: Secondary | ICD-10-CM | POA: Diagnosis not present

## 2019-03-28 DIAGNOSIS — Z7901 Long term (current) use of anticoagulants: Secondary | ICD-10-CM | POA: Diagnosis not present

## 2019-03-28 DIAGNOSIS — I10 Essential (primary) hypertension: Secondary | ICD-10-CM | POA: Diagnosis not present

## 2019-03-28 DIAGNOSIS — Z6838 Body mass index (BMI) 38.0-38.9, adult: Secondary | ICD-10-CM | POA: Diagnosis not present

## 2019-03-28 DIAGNOSIS — R0902 Hypoxemia: Secondary | ICD-10-CM | POA: Diagnosis not present

## 2019-03-28 DIAGNOSIS — Z79899 Other long term (current) drug therapy: Secondary | ICD-10-CM | POA: Diagnosis not present

## 2019-03-28 DIAGNOSIS — Z20828 Contact with and (suspected) exposure to other viral communicable diseases: Secondary | ICD-10-CM | POA: Diagnosis not present

## 2019-03-28 DIAGNOSIS — I4891 Unspecified atrial fibrillation: Secondary | ICD-10-CM | POA: Diagnosis not present

## 2019-03-28 DIAGNOSIS — B349 Viral infection, unspecified: Secondary | ICD-10-CM | POA: Diagnosis not present

## 2019-03-28 NOTE — ED Triage Notes (Signed)
Pt with dry cough since last night.  Chills, body aches, fever today, fever high as 104, 100.1 currently. Denies taking anything for it.

## 2019-03-28 NOTE — ED Notes (Signed)
Registration reported that pt left at [place]

## 2019-03-29 DIAGNOSIS — Z20828 Contact with and (suspected) exposure to other viral communicable diseases: Secondary | ICD-10-CM | POA: Diagnosis not present

## 2019-03-29 DIAGNOSIS — R0902 Hypoxemia: Secondary | ICD-10-CM | POA: Diagnosis not present

## 2019-03-29 DIAGNOSIS — I4891 Unspecified atrial fibrillation: Secondary | ICD-10-CM | POA: Diagnosis not present

## 2019-03-29 DIAGNOSIS — B349 Viral infection, unspecified: Secondary | ICD-10-CM | POA: Diagnosis not present

## 2019-03-30 DIAGNOSIS — Z20828 Contact with and (suspected) exposure to other viral communicable diseases: Secondary | ICD-10-CM | POA: Diagnosis not present

## 2019-03-30 DIAGNOSIS — B349 Viral infection, unspecified: Secondary | ICD-10-CM | POA: Diagnosis not present

## 2019-03-30 DIAGNOSIS — R0902 Hypoxemia: Secondary | ICD-10-CM | POA: Diagnosis not present

## 2019-03-30 DIAGNOSIS — I4891 Unspecified atrial fibrillation: Secondary | ICD-10-CM | POA: Diagnosis not present

## 2019-04-02 ENCOUNTER — Other Ambulatory Visit: Payer: Self-pay | Admitting: Internal Medicine

## 2019-04-24 DIAGNOSIS — Z7901 Long term (current) use of anticoagulants: Secondary | ICD-10-CM | POA: Diagnosis not present

## 2019-04-24 DIAGNOSIS — I110 Hypertensive heart disease with heart failure: Secondary | ICD-10-CM | POA: Diagnosis not present

## 2019-04-24 DIAGNOSIS — Z79899 Other long term (current) drug therapy: Secondary | ICD-10-CM | POA: Diagnosis not present

## 2019-04-24 DIAGNOSIS — K859 Acute pancreatitis without necrosis or infection, unspecified: Secondary | ICD-10-CM | POA: Diagnosis not present

## 2019-04-24 DIAGNOSIS — I480 Paroxysmal atrial fibrillation: Secondary | ICD-10-CM | POA: Diagnosis not present

## 2019-04-24 DIAGNOSIS — I420 Dilated cardiomyopathy: Secondary | ICD-10-CM | POA: Diagnosis not present

## 2019-04-24 DIAGNOSIS — U071 COVID-19: Secondary | ICD-10-CM | POA: Diagnosis not present

## 2019-04-24 DIAGNOSIS — I509 Heart failure, unspecified: Secondary | ICD-10-CM | POA: Diagnosis not present

## 2019-04-25 DIAGNOSIS — K859 Acute pancreatitis without necrosis or infection, unspecified: Secondary | ICD-10-CM | POA: Diagnosis not present

## 2019-04-25 DIAGNOSIS — I420 Dilated cardiomyopathy: Secondary | ICD-10-CM | POA: Diagnosis not present

## 2019-04-25 DIAGNOSIS — Z7901 Long term (current) use of anticoagulants: Secondary | ICD-10-CM | POA: Diagnosis not present

## 2019-04-25 DIAGNOSIS — I480 Paroxysmal atrial fibrillation: Secondary | ICD-10-CM | POA: Diagnosis not present

## 2019-04-25 DIAGNOSIS — I110 Hypertensive heart disease with heart failure: Secondary | ICD-10-CM | POA: Diagnosis not present

## 2019-04-25 DIAGNOSIS — U071 COVID-19: Secondary | ICD-10-CM | POA: Diagnosis not present

## 2019-04-25 DIAGNOSIS — I509 Heart failure, unspecified: Secondary | ICD-10-CM | POA: Diagnosis not present

## 2019-04-25 DIAGNOSIS — Z79899 Other long term (current) drug therapy: Secondary | ICD-10-CM | POA: Diagnosis not present

## 2019-04-26 DIAGNOSIS — I4891 Unspecified atrial fibrillation: Secondary | ICD-10-CM | POA: Diagnosis not present

## 2019-04-26 DIAGNOSIS — Z79899 Other long term (current) drug therapy: Secondary | ICD-10-CM | POA: Diagnosis not present

## 2019-04-26 DIAGNOSIS — I10 Essential (primary) hypertension: Secondary | ICD-10-CM | POA: Diagnosis not present

## 2019-04-26 DIAGNOSIS — I480 Paroxysmal atrial fibrillation: Secondary | ICD-10-CM | POA: Diagnosis not present

## 2019-04-26 DIAGNOSIS — I509 Heart failure, unspecified: Secondary | ICD-10-CM | POA: Diagnosis not present

## 2019-04-26 DIAGNOSIS — U071 COVID-19: Secondary | ICD-10-CM | POA: Diagnosis not present

## 2019-04-26 DIAGNOSIS — Z7901 Long term (current) use of anticoagulants: Secondary | ICD-10-CM | POA: Diagnosis not present

## 2019-04-26 DIAGNOSIS — K859 Acute pancreatitis without necrosis or infection, unspecified: Secondary | ICD-10-CM | POA: Diagnosis not present

## 2019-04-26 DIAGNOSIS — I110 Hypertensive heart disease with heart failure: Secondary | ICD-10-CM | POA: Diagnosis not present

## 2019-04-26 DIAGNOSIS — I420 Dilated cardiomyopathy: Secondary | ICD-10-CM | POA: Diagnosis not present

## 2019-04-26 DIAGNOSIS — R071 Chest pain on breathing: Secondary | ICD-10-CM | POA: Diagnosis not present

## 2019-05-23 DIAGNOSIS — Z0000 Encounter for general adult medical examination without abnormal findings: Secondary | ICD-10-CM | POA: Diagnosis not present

## 2019-05-23 DIAGNOSIS — Z6839 Body mass index (BMI) 39.0-39.9, adult: Secondary | ICD-10-CM | POA: Diagnosis not present

## 2019-05-28 DIAGNOSIS — K859 Acute pancreatitis without necrosis or infection, unspecified: Secondary | ICD-10-CM | POA: Diagnosis not present

## 2019-05-28 DIAGNOSIS — Q453 Other congenital malformations of pancreas and pancreatic duct: Secondary | ICD-10-CM | POA: Diagnosis not present

## 2019-05-28 DIAGNOSIS — K863 Pseudocyst of pancreas: Secondary | ICD-10-CM | POA: Diagnosis not present

## 2019-05-28 DIAGNOSIS — Q451 Annular pancreas: Secondary | ICD-10-CM | POA: Diagnosis not present

## 2019-05-29 DIAGNOSIS — K859 Acute pancreatitis without necrosis or infection, unspecified: Secondary | ICD-10-CM | POA: Diagnosis not present

## 2019-05-29 DIAGNOSIS — K863 Pseudocyst of pancreas: Secondary | ICD-10-CM | POA: Diagnosis not present

## 2019-07-01 ENCOUNTER — Other Ambulatory Visit: Payer: Self-pay | Admitting: Internal Medicine

## 2019-07-02 ENCOUNTER — Other Ambulatory Visit: Payer: Self-pay | Admitting: *Deleted

## 2019-07-02 MED ORDER — FLECAINIDE ACETATE 100 MG PO TABS: ORAL_TABLET | ORAL | 0 refills | Status: DC

## 2019-07-02 NOTE — Telephone Encounter (Signed)
Prescription refill request for Eliquis received.  Last office visit: [place] (02-14-2019) Scr: 0.69(09-20-2018) Age: 28 Weight: 137.4 kg   Prescription refill sent.

## 2019-08-08 DIAGNOSIS — K861 Other chronic pancreatitis: Secondary | ICD-10-CM | POA: Diagnosis not present

## 2019-08-08 DIAGNOSIS — Z6838 Body mass index (BMI) 38.0-38.9, adult: Secondary | ICD-10-CM | POA: Diagnosis not present

## 2019-10-01 ENCOUNTER — Other Ambulatory Visit: Payer: Self-pay | Admitting: *Deleted

## 2019-10-01 MED ORDER — FLECAINIDE ACETATE 100 MG PO TABS: ORAL_TABLET | ORAL | 0 refills | Status: DC

## 2019-10-11 DIAGNOSIS — Z7901 Long term (current) use of anticoagulants: Secondary | ICD-10-CM | POA: Diagnosis not present

## 2019-10-11 DIAGNOSIS — K5900 Constipation, unspecified: Secondary | ICD-10-CM | POA: Diagnosis not present

## 2019-10-11 DIAGNOSIS — Q451 Annular pancreas: Secondary | ICD-10-CM | POA: Diagnosis not present

## 2019-10-11 DIAGNOSIS — K859 Acute pancreatitis without necrosis or infection, unspecified: Secondary | ICD-10-CM | POA: Diagnosis not present

## 2019-10-11 DIAGNOSIS — Z8616 Personal history of COVID-19: Secondary | ICD-10-CM | POA: Diagnosis not present

## 2019-10-11 DIAGNOSIS — K863 Pseudocyst of pancreas: Secondary | ICD-10-CM | POA: Diagnosis not present

## 2019-10-11 DIAGNOSIS — R000 Tachycardia, unspecified: Secondary | ICD-10-CM | POA: Diagnosis not present

## 2019-10-11 DIAGNOSIS — K861 Other chronic pancreatitis: Secondary | ICD-10-CM | POA: Diagnosis not present

## 2019-10-11 DIAGNOSIS — I482 Chronic atrial fibrillation, unspecified: Secondary | ICD-10-CM | POA: Diagnosis not present

## 2019-10-11 DIAGNOSIS — R1011 Right upper quadrant pain: Secondary | ICD-10-CM | POA: Diagnosis not present

## 2019-10-11 DIAGNOSIS — K8681 Exocrine pancreatic insufficiency: Secondary | ICD-10-CM | POA: Diagnosis not present

## 2019-10-11 DIAGNOSIS — I10 Essential (primary) hypertension: Secondary | ICD-10-CM | POA: Diagnosis not present

## 2019-10-11 DIAGNOSIS — I493 Ventricular premature depolarization: Secondary | ICD-10-CM | POA: Diagnosis not present

## 2019-10-11 DIAGNOSIS — I420 Dilated cardiomyopathy: Secondary | ICD-10-CM | POA: Diagnosis not present

## 2019-10-11 DIAGNOSIS — Z79899 Other long term (current) drug therapy: Secondary | ICD-10-CM | POA: Diagnosis not present

## 2019-10-11 DIAGNOSIS — I5020 Unspecified systolic (congestive) heart failure: Secondary | ICD-10-CM | POA: Diagnosis not present

## 2019-10-11 DIAGNOSIS — R002 Palpitations: Secondary | ICD-10-CM | POA: Diagnosis not present

## 2019-10-11 DIAGNOSIS — F419 Anxiety disorder, unspecified: Secondary | ICD-10-CM | POA: Diagnosis not present

## 2019-10-11 DIAGNOSIS — I480 Paroxysmal atrial fibrillation: Secondary | ICD-10-CM | POA: Diagnosis not present

## 2019-10-11 DIAGNOSIS — I110 Hypertensive heart disease with heart failure: Secondary | ICD-10-CM | POA: Diagnosis not present

## 2019-10-11 DIAGNOSIS — K8500 Idiopathic acute pancreatitis without necrosis or infection: Secondary | ICD-10-CM | POA: Diagnosis not present

## 2019-10-11 DIAGNOSIS — I509 Heart failure, unspecified: Secondary | ICD-10-CM | POA: Diagnosis not present

## 2019-10-11 DIAGNOSIS — F17220 Nicotine dependence, chewing tobacco, uncomplicated: Secondary | ICD-10-CM | POA: Diagnosis not present

## 2019-10-12 DIAGNOSIS — K5900 Constipation, unspecified: Secondary | ICD-10-CM | POA: Diagnosis not present

## 2019-10-12 DIAGNOSIS — I480 Paroxysmal atrial fibrillation: Secondary | ICD-10-CM | POA: Diagnosis not present

## 2019-10-12 DIAGNOSIS — F419 Anxiety disorder, unspecified: Secondary | ICD-10-CM | POA: Diagnosis not present

## 2019-10-12 DIAGNOSIS — R000 Tachycardia, unspecified: Secondary | ICD-10-CM | POA: Diagnosis not present

## 2019-10-12 DIAGNOSIS — R002 Palpitations: Secondary | ICD-10-CM | POA: Diagnosis not present

## 2019-10-12 DIAGNOSIS — I5020 Unspecified systolic (congestive) heart failure: Secondary | ICD-10-CM | POA: Diagnosis not present

## 2019-10-12 DIAGNOSIS — I10 Essential (primary) hypertension: Secondary | ICD-10-CM | POA: Diagnosis not present

## 2019-10-12 DIAGNOSIS — K859 Acute pancreatitis without necrosis or infection, unspecified: Secondary | ICD-10-CM | POA: Diagnosis not present

## 2019-10-16 DIAGNOSIS — Q451 Annular pancreas: Secondary | ICD-10-CM | POA: Diagnosis not present

## 2019-10-16 DIAGNOSIS — K859 Acute pancreatitis without necrosis or infection, unspecified: Secondary | ICD-10-CM | POA: Diagnosis not present

## 2019-10-16 DIAGNOSIS — K863 Pseudocyst of pancreas: Secondary | ICD-10-CM | POA: Diagnosis not present

## 2019-10-16 DIAGNOSIS — Q453 Other congenital malformations of pancreas and pancreatic duct: Secondary | ICD-10-CM | POA: Diagnosis not present

## 2019-10-23 ENCOUNTER — Other Ambulatory Visit: Payer: Self-pay

## 2019-10-23 ENCOUNTER — Ambulatory Visit (HOSPITAL_COMMUNITY)
Admission: RE | Admit: 2019-10-23 | Discharge: 2019-10-23 | Disposition: A | Discharge status: Home / Self Care | Payer: BC Managed Care – PPO | Source: Ambulatory Visit | Attending: Nurse Practitioner | Admitting: Nurse Practitioner

## 2019-10-23 VITALS — BP 106/72 | HR 81 | Ht 72.0 in | Wt 286.0 lb

## 2019-10-23 DIAGNOSIS — G8929 Other chronic pain: Secondary | ICD-10-CM | POA: Insufficient documentation

## 2019-10-23 DIAGNOSIS — D6869 Other thrombophilia: Secondary | ICD-10-CM | POA: Diagnosis not present

## 2019-10-23 DIAGNOSIS — Z79899 Other long term (current) drug therapy: Secondary | ICD-10-CM | POA: Insufficient documentation

## 2019-10-23 DIAGNOSIS — K760 Fatty (change of) liver, not elsewhere classified: Secondary | ICD-10-CM | POA: Insufficient documentation

## 2019-10-23 DIAGNOSIS — I480 Paroxysmal atrial fibrillation: Secondary | ICD-10-CM

## 2019-10-23 DIAGNOSIS — Z87891 Personal history of nicotine dependence: Secondary | ICD-10-CM | POA: Insufficient documentation

## 2019-10-23 DIAGNOSIS — I4892 Unspecified atrial flutter: Secondary | ICD-10-CM | POA: Insufficient documentation

## 2019-10-23 DIAGNOSIS — F329 Major depressive disorder, single episode, unspecified: Secondary | ICD-10-CM | POA: Diagnosis not present

## 2019-10-23 DIAGNOSIS — M545 Low back pain: Secondary | ICD-10-CM | POA: Diagnosis not present

## 2019-10-23 DIAGNOSIS — I5022 Chronic systolic (congestive) heart failure: Secondary | ICD-10-CM | POA: Diagnosis not present

## 2019-10-23 DIAGNOSIS — Z7901 Long term (current) use of anticoagulants: Secondary | ICD-10-CM | POA: Diagnosis not present

## 2019-10-23 DIAGNOSIS — I4891 Unspecified atrial fibrillation: Secondary | ICD-10-CM | POA: Diagnosis present

## 2019-10-23 DIAGNOSIS — E669 Obesity, unspecified: Secondary | ICD-10-CM | POA: Insufficient documentation

## 2019-10-23 DIAGNOSIS — Z6841 Body Mass Index (BMI) 40.0 and over, adult: Secondary | ICD-10-CM | POA: Insufficient documentation

## 2019-10-23 MED ORDER — DILTIAZEM HCL 30 MG PO TABS: ORAL_TABLET | ORAL | 1 refills | Status: DC

## 2019-10-24 ENCOUNTER — Encounter (HOSPITAL_COMMUNITY): Payer: Self-pay | Admitting: Nurse Practitioner

## 2019-10-24 NOTE — Progress Notes (Signed)
Primary Care Physician: Nathaniel Deiters, MD Referring Physician: Dr. Porfirio Oar [name] is a 29 y.o. male with a h/o paroxysmal afib that is in the afib clinic for increased palpitations. He had a afib ablation in 2018. He has been maintained with flecainide and metoprolol. Is on eliquis 5 mg bid for CHA2DS2VASc score of 2. He has noted increased palpitations since he was hospitalized with acute on chronic  Pancreatitis 10/11/19. [name]  He had a bad episode at work the other night but today is in SR. He also had covid last summer at which time he has increase of afib episodes.  He is interested in redo ablation if needed.   Today, he denies symptoms of  shortness of breath, orthopnea, PND, lower extremity edema, dizziness, presyncope, syncope, or neurologic sequela. The patient is tolerating medications without difficulties and is otherwise without complaint today.   Past Medical History:  Diagnosis Date  . Atrial flutter (HCC) 01/17/2016   Admx 5/17 with AFlutter with RVR in setting of pancreatitis // CHADS2-VASc=1 (CHF) // Anticoag not started // Amiodarone >> NSR   . Chronic lower back pain   . Chronic systolic CHF (congestive heart failure) (HCC)    Echo 01/17/16 - EF 35-40%, diffuse HK, mild LAE   . 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HCC) 02/03/2016   a. EF 35-40% in setting of AFlutter with RVR in 5/17 // probably tachycardia mediated // needs FU echo 2-3 mos after NSR restored // b. Limited Echo 7/17: EF 45-50%, diff HK, mild LAE  . Depression    "last 2-3 months; never treated for it" (12/09/2016)  . Fatty liver 12/2015   per CT  . Headache    "monthly" (12/09/2016)  . Obesity    BMI 45 12/2015  . Pancreatitis 2012   recurrent acute. Admissions to [facility], 12/2015 admissin to [facility] hospital, ct then shows pseudocyst.   . Pancreatitis 2012 X 2; 2017 X 2  . Paroxysmal atrial fibrillation (HCC)   . Typical atrial flutter (HCC) 06/06/2017   Past Surgical History:  Procedure Laterality Date    . ATRIAL FIBRILLATION ABLATION N/A 12/09/2016   Procedure: Atrial Fibrillation Ablation;  Surgeon: Nathaniel Range, MD;  Location: [facility] INVASIVE CV LAB;  Service: Cardiovascular;  Laterality: N/A;  . FRACTURE SURGERY    . HAND SURGERY Right ~ 2008   "had pins in it; pulled them"  . TEE WITHOUT CARDIOVERSION N/A 12/08/2016   Procedure: TRANSESOPHAGEAL ECHOCARDIOGRAM (TEE);  Surgeon: Nathaniel Bathe, MD;  Location: [facility] ENDOSCOPY;  Service: Cardiovascular;  Laterality: N/A;  . TONSILLECTOMY AND ADENOIDECTOMY     "in my teens"    Current Outpatient Medications  Medication Sig Dispense Refill  . busPIRone (BUSPAR) 15 MG tablet Take by mouth 2 (two) times daily.     . cyclobenzaprine (FLEXERIL) 10 MG tablet Take 10 mg by mouth 3 (three) times daily as needed for muscle spasms.    [name] ELIQUIS 5 MG TABS tablet TAKE 1 TABLET BY MOUTH TWICE DAILY 60 tablet 5  . flecainide (TAMBOCOR) 100 MG tablet TAKE 1 TABLET(100 MG) BY MOUTH TWICE DAILY 180 tablet 0  . HYDROcodone-acetaminophen (NORCO/VICODIN) 5-325 MG tablet Take 1 tablet by mouth as needed. Make take every 4 hours if needed    . lidocaine (LIDODERM) 5 % Apply patch to skin and leave on for 12 hours then remove - only uses as needed    . metoprolol succinate (TOPROL-XL) 25 MG 24 hr tablet TAKE 1 TABLET(25 MG) BY  MOUTH DAILY 90 tablet 2  . Multiple Vitamins-Minerals (IMMUNE SUPPORT VITAMIN C PO) Take by mouth daily. Has vitamin D in it    . triamcinolone cream (KENALOG) 0.1 % Apply 1 application topically daily as needed (for irritation).     [name] diltiazem (CARDIZEM) 30 MG tablet Take 1 tablet every 4 hours AS NEEDED for AFIB heart rate >100 45 tablet 1   No current facility-administered medications for this encounter.    No Known Allergies  Social History   Socioeconomic History  . Marital status: Married    Spouse name: Not on file  . Number of children: 0  . Years of education: 85  . Highest education level: Not on file  Occupational History     Employer: [employer]  Tobacco Use  . Smoking status: Former Smoker    Packs/day: 0.50    Types: Cigarettes  . Smokeless tobacco: Current User    Types: Snuff  Substance and Sexual Activity  . Alcohol use: Not Currently    Comment: occasional  . Drug use: Not Currently    Types: Marijuana    Comment: 12/09/2016 "daily from age 49 to 53"  . Sexual activity: Yes    Birth control/protection: None  Other Topics Concern  . Not on file  Social History Narrative   Pt lives in [place] with fiancee.  Works at [employer] in [place].   Social Determinants of Health   Financial Resource Strain:   . Difficulty of Paying Living Expenses: Not on file  Food Insecurity:   . Worried About Charity fundraiser in the Last Year: Not on file  . Ran Out of Food in the Last Year: Not on file  Transportation Needs:   . Lack of Transportation (Medical): Not on file  . Lack of Transportation (Non-Medical): Not on file  Physical Activity:   . Days of Exercise per Week: Not on file  . Minutes of Exercise per Session: Not on file  Stress:   . Feeling of Stress : Not on file  Social Connections:   . Frequency of Communication with Friends and Family: Not on file  . Frequency of Social Gatherings with Friends and Family: Not on file  . Attends Religious Services: Not on file  . Active Member of Clubs or Organizations: Not on file  . Attends Archivist Meetings: Not on file  . Marital Status: Not on file  Intimate Partner Violence:   . Fear of Current or Ex-Partner: Not on file  . Emotionally Abused: Not on file  . Physically Abused: Not on file  . Sexually Abused: Not on file    Family History  Problem Relation Age of Onset  . Diabetes Mother     ROS- All systems are reviewed and negative except as per the HPI above  Physical Exam: Vitals:   10/23/19 1510  BP: 106/72  Pulse: 81  Weight: 129.7 kg  Height: 6' (1.829 m)   Wt Readings from Last 3 Encounters:  10/23/19  129.7 kg  03/28/19 136.1 kg  02/09/19 131.5 kg    Labs: Lab Results  Component Value Date   NA 136 09/20/2018   K 3.8 09/20/2018   CL 106 09/20/2018   CO2 22 09/20/2018   GLUCOSE 109 (H) 09/20/2018   BUN 14 09/20/2018   CREATININE 0.69 09/20/2018   CALCIUM 8.8 (L) 09/20/2018   PHOS 3.8 05/09/2018   MG 2.1 05/09/2018   Lab Results  Component Value Date   INR 1.22  01/18/2016   Lab Results  Component Value Date   CHOL 116 05/11/2018   HDL 31 (L) 05/11/2018   LDLCALC 73 05/11/2018   TRIG 61 05/11/2018     GEN- The patient is well appearing, alert and oriented x 3 today.   Head- normocephalic, atraumatic Eyes-  Sclera clear, conjunctiva pink Ears- hearing intact Oropharynx- clear Neck- supple, no JVP Lymph- no cervical lymphadenopathy Lungs- Clear to ausculation bilaterally, normal work of breathing Heart- Regular rate and rhythm, no murmurs, rubs or gallops, PMI not laterally displaced GI- soft, NT, ND, + BS Extremities- no clubbing, cyanosis, or edema MS- no significant deformity or atrophy Skin- no rash or lesion Psych- euthymic mood, full affect Neuro- strength and sensation are intact  EKG- NSR, normal ekg Pr int 148 ms, qrs int 86 ms, qtc 432 ms   Assessment and Plan: 1. afib S/p ablation in 2018 Has for the most part been staying in SR except  had increase in palpitations recently with pancreatitis and covid last summer  Denies any alcohol, tobacco or excessive caffeine use For now continue flecainide 100 mg bid and metoprolol 25 mg qd I can not increase BB for soft BP I will RX cardizem 30 mg to take as needed for afib episode   Continue  eliquis 5 mg bid for CHA2DS2VASc of 2   Pt would like an appointment   with Dr. Johney Frame to discuss repeat ablation   Lupita Leash C. [facility] [address] [place], [place] [address] [phone]

## 2019-10-26 ENCOUNTER — Other Ambulatory Visit: Payer: Self-pay

## 2019-10-26 ENCOUNTER — Telehealth (INDEPENDENT_AMBULATORY_CARE_PROVIDER_SITE_OTHER): Payer: BC Managed Care – PPO | Admitting: Internal Medicine

## 2019-10-26 ENCOUNTER — Encounter: Payer: Self-pay | Admitting: Internal Medicine

## 2019-10-26 ENCOUNTER — Telehealth: Payer: Self-pay

## 2019-10-26 VITALS — BP 125/88 | HR 56 | Ht 72.0 in | Wt 280.0 lb

## 2019-10-26 DIAGNOSIS — I4891 Unspecified atrial fibrillation: Secondary | ICD-10-CM

## 2019-10-26 DIAGNOSIS — D6869 Other thrombophilia: Secondary | ICD-10-CM

## 2019-10-26 DIAGNOSIS — I480 Paroxysmal atrial fibrillation: Secondary | ICD-10-CM

## 2019-10-26 DIAGNOSIS — Z01810 Encounter for preprocedural cardiovascular examination: Secondary | ICD-10-CM

## 2019-10-26 DIAGNOSIS — R002 Palpitations: Secondary | ICD-10-CM

## 2019-10-26 MED ORDER — METOPROLOL TARTRATE 100 MG PO TABS: ORAL_TABLET | ORAL | 0 refills | Status: DC

## 2019-10-26 NOTE — Telephone Encounter (Signed)
Spoke with Ladona Ridgel per pt request and she verbalized understanding of his CT/ Ablation instructions. Will call if they develop any further questions.

## 2019-10-26 NOTE — Telephone Encounter (Signed)
Follow Up  Pt's wife is returning phone call in regards to instructions for his 11/09/19 ablation.  Please call to discuss

## 2019-10-26 NOTE — Telephone Encounter (Signed)
-----   Message from Hillis Range, MD sent at 10/26/2019  1:18 PM EST ----- Please schedule afib ablation [facility]/ICE/anesthesia  Cardiac CT  He asked about me putting him out of work and I told him no

## 2019-10-26 NOTE — Telephone Encounter (Signed)
LM for pts wife, Ladona Ridgel [phone], per pt request.. do go over his instructions for his 11/09/19 Ablation.

## 2019-10-26 NOTE — H&P (View-Only) (Signed)
Electrophysiology TeleHealth Note  Due to national recommendations of social distancing due to [place] 19, an audio telehealth visit is felt to be most appropriate for this patient at this time.  Verbal consent was obtained by me for the telehealth visit today.  The patient does not have capability for a virtual visit.  A phone visit is therefore required today.   Date:  10/26/2019   ID:  Nathaniel [name], DOB 19-Dec-1990, MRN [id number]  Location: patient's home  Provider location:  [place]  Evaluation Performed: Follow-up visit  PCP:  Neale Burly, MD   Electrophysiologist:  Dr Rayann Heman  Chief Complaint:  palpitations  History of Present Illness:    Nathaniel [name] is a 29 y.o. male who presents via telehealth conferencing today.  Since last being seen in our clinic, the patient reports doing reasonably well.  He had COVID in August.  He has recovered.  He continues to have episodes afib. He has also had issues with ongoing pacreatitis.  He has had increasing frequency and duration of afib since that time.  He has SOB with his afib.  He does not feel like he is able to work. Today, he denies symptoms of palpitations, chest pain,  lower extremity edema, dizziness, presyncope, or syncope.  The patient is otherwise without complaint today.  The patient denies symptoms of fevers, chills, cough, or new SOB worrisome for COVID 19.  Past Medical History:  Diagnosis Date  . Atrial flutter ([place]) 01/17/2016   Admx 5/17 with AFlutter with RVR in setting of pancreatitis // CHADS2-VASc=1 (CHF) // Anticoag not started // Amiodarone >> NSR   . Chronic lower back pain   . Chronic systolic CHF (congestive heart failure) ([place])    Echo 01/17/16 - EF 35-40%, diffuse HK, mild LAE   . 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[place]) 02/03/2016   a. EF 35-40% in setting of AFlutter with RVR in 5/17 // probably tachycardia mediated // needs FU echo 2-3 mos after NSR restored // b. Limited Echo 7/17: EF 45-50%,  diff HK, mild LAE  . Depression    "last 2-3 months; never treated for it" (12/09/2016)  . Fatty liver 12/2015   per CT  . Headache    "monthly" (12/09/2016)  . Obesity    BMI 45 12/2015  . Pancreatitis 2012   recurrent acute. Admissions to [facility], 12/2015 admissin to [facility], ct then shows pseudocyst.   . Pancreatitis 2012 X 2; 2017 X 2  . Paroxysmal atrial fibrillation ([facility])   . Typical atrial flutter ([place]) 06/06/2017    Past Surgical History:  Procedure Laterality Date  . ATRIAL FIBRILLATION ABLATION N/A 12/09/2016   Procedure: Atrial Fibrillation Ablation;  Surgeon: Thompson Grayer, MD;  Location: [facility];  Service: Cardiovascular;  Laterality: N/A;  . FRACTURE SURGERY    . HAND SURGERY Right ~ 2008   "had pins in it; pulled them"  . TEE WITHOUT CARDIOVERSION N/A 12/08/2016   Procedure: TRANSESOPHAGEAL ECHOCARDIOGRAM (TEE);  Surgeon: Jerline Pain, MD;  Location: [facility] ENDOSCOPY;  Service: Cardiovascular;  Laterality: N/A;  . TONSILLECTOMY AND ADENOIDECTOMY     "in my teens"    Current Outpatient Medications  Medication Sig Dispense Refill  . busPIRone (BUSPAR) 15 MG tablet Take by mouth 2 (two) times daily.     . cyclobenzaprine (FLEXERIL) 10 MG tablet Take 10 mg by mouth 3 (three) times daily as needed for muscle spasms.    [name] diltiazem (CARDIZEM) 30 MG tablet Take  1 tablet every 4 hours AS NEEDED for AFIB heart rate >100 45 tablet 1  . ELIQUIS 5 MG TABS tablet TAKE 1 TABLET BY MOUTH TWICE DAILY 60 tablet 5  . flecainide (TAMBOCOR) 100 MG tablet TAKE 1 TABLET(100 MG) BY MOUTH TWICE DAILY 180 tablet 0  . HYDROcodone-acetaminophen (NORCO/VICODIN) 5-325 MG tablet Take 1 tablet by mouth as needed. Make take every 4 hours if needed    . lidocaine (LIDODERM) 5 % Apply patch to skin and leave on for 12 hours then remove - only uses as needed    . metoprolol succinate (TOPROL-XL) 25 MG 24 hr tablet TAKE 1 TABLET(25 MG) BY MOUTH DAILY 90 tablet 2  . Multiple Vitamins-Minerals  (IMMUNE SUPPORT VITAMIN C PO) Take by mouth daily. Has vitamin D in it    . triamcinolone cream (KENALOG) 0.1 % Apply 1 application topically daily as needed (for irritation).      No current facility-administered medications for this visit.    Allergies:   Patient has no known allergies.   Social History:  The patient  reports that he has quit smoking. His smoking use included cigarettes. He smoked 0.50 packs per day. His smokeless tobacco use includes snuff. He reports previous alcohol use. He reports previous drug use. Drug: Marijuana.   Family History:  The patient's  family history includes Diabetes in his mother.   ROS:  Please see the history of present illness.   All other systems are personally reviewed and negative.    Exam:    Vital Signs:  BP 125/88   Pulse (!) 56   Ht 6' (1.829 m)   Wt 280 lb (127 kg)   BMI 37.97 kg/m   Well sounding, alert and conversant   Labs/Other Tests and Data Reviewed:    Recent Labs: No results found for requested labs within last 8760 hours.   Wt Readings from Last 3 Encounters:  10/26/19 280 lb (127 kg)  10/23/19 286 lb (129.7 kg)  03/28/19 300 lb (136.1 kg)   Echo 01/25/19- EF 55%  ASSESSMENT & PLAN:    1.  Paroxysmal atrial fibrillation/ atrial flutter The patient has symptomatic, recurrent paroxysmal atrial fibrillation. He did well post ablation in 2018.  He has had documented recurrence of afib. he has failed medical therapy with amiodarone, flecainide, and metoprolol. Chads2vasc score is 0.  he is anticoagulated with eliquis . Therapeutic strategies for afib including medicine and ablation were discussed in detail with the patient today. Risk, benefits, and alternatives to EP study and radiofrequency ablation for afib were also discussed in detail today. These risks include but are not limited to stroke, bleeding, vascular damage, tamponade, perforation, damage to the esophagus, lungs, and other structures, pulmonary vein  stenosis, worsening renal function, and death. The patient understands these risk and wishes to proceed.  We will therefore proceed with catheter ablation at the next available time.  Carto, ICE, anesthesia are requested for the procedure.  Will also obtain cardiac CT prior to the procedure to exclude LAA thrombus and further evaluate atrial anatomy.  2. Morbid obesity I have reviewed the patients BMI and decreased success rates with ablation at length today.  Weight loss is strongly advised.  Per Guijian et al (PACE 2013; 36: 583-094), patients with BMI 25-29.9 (obese) have a 27% increase in AF recurrence post ablation.  Patients with BMI >30 have a 31% increase in AF recurrence post ablation when compared to those with BMI <25.  3. Previous tachycardia mediated  CM Resolved post ablation of afib  Patient Risk:  after full review of this patients clinical status, I feel that they are at high risk at this time.  Today, I have spent 20 minutes with the patient with telehealth technology discussing arrhythmia management .    Randolm Idol, MD  10/26/2019 1:06 PM     [facility] [address] [place] [address] [phone] (office) [phone] (fax)

## 2019-10-26 NOTE — Telephone Encounter (Signed)
Pt agreed and verbalized understanding for Afib Ablation 11/09/19 at 7:30am.. will plan for labs, COVID test and CT.

## 2019-10-26 NOTE — Progress Notes (Signed)
Electrophysiology TeleHealth Note  Due to national recommendations of social distancing due to [place] 19, an audio telehealth visit is felt to be most appropriate for this patient at this time.  Verbal consent was obtained by me for the telehealth visit today.  The patient does not have capability for a virtual visit.  A phone visit is therefore required today.   Date:  10/26/2019   ID:  Carlis Blanchard, DOB 19-Dec-1990, MRN [id number]  Location: patient's home  Provider location:  [place]  Evaluation Performed: Follow-up visit  PCP:  Neale Burly, MD   Electrophysiologist:  Dr Rayann Heman  Chief Complaint:  palpitations  History of Present Illness:    Joangel Vanosdol is a 29 y.o. male who presents via telehealth conferencing today.  Since last being seen in our clinic, the patient reports doing reasonably well.  He had COVID in August.  He has recovered.  He continues to have episodes afib. He has also had issues with ongoing pacreatitis.  He has had increasing frequency and duration of afib since that time.  He has SOB with his afib.  He does not feel like he is able to work. Today, he denies symptoms of palpitations, chest pain,  lower extremity edema, dizziness, presyncope, or syncope.  The patient is otherwise without complaint today.  The patient denies symptoms of fevers, chills, cough, or new SOB worrisome for COVID 19.  Past Medical History:  Diagnosis Date  . Atrial flutter ([place]) 01/17/2016   Admx 5/17 with AFlutter with RVR in setting of pancreatitis // CHADS2-VASc=1 (CHF) // Anticoag not started // Amiodarone >> NSR   . Chronic lower back pain   . Chronic systolic CHF (congestive heart failure) ([place])    Echo 01/17/16 - EF 35-40%, diffuse HK, mild LAE   . 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[place]) 02/03/2016   a. EF 35-40% in setting of AFlutter with RVR in 5/17 // probably tachycardia mediated // needs FU echo 2-3 mos after NSR restored // b. Limited Echo 7/17: EF 45-50%,  diff HK, mild LAE  . Depression    "last 2-3 months; never treated for it" (12/09/2016)  . Fatty liver 12/2015   per CT  . Headache    "monthly" (12/09/2016)  . Obesity    BMI 45 12/2015  . Pancreatitis 2012   recurrent acute. Admissions to [facility], 12/2015 admissin to [facility] hospital, ct then shows pseudocyst.   . Pancreatitis 2012 X 2; 2017 X 2  . Paroxysmal atrial fibrillation ([facility])   . Typical atrial flutter ([place]) 06/06/2017    Past Surgical History:  Procedure Laterality Date  . ATRIAL FIBRILLATION ABLATION N/A 12/09/2016   Procedure: Atrial Fibrillation Ablation;  Surgeon: Thompson Grayer, MD;  Location: [facility];  Service: Cardiovascular;  Laterality: N/A;  . FRACTURE SURGERY    . HAND SURGERY Right ~ 2008   "had pins in it; pulled them"  . TEE WITHOUT CARDIOVERSION N/A 12/08/2016   Procedure: TRANSESOPHAGEAL ECHOCARDIOGRAM (TEE);  Surgeon: Jerline Pain, MD;  Location: [facility] ENDOSCOPY;  Service: Cardiovascular;  Laterality: N/A;  . TONSILLECTOMY AND ADENOIDECTOMY     "in my teens"    Current Outpatient Medications  Medication Sig Dispense Refill  . busPIRone (BUSPAR) 15 MG tablet Take by mouth 2 (two) times daily.     . cyclobenzaprine (FLEXERIL) 10 MG tablet Take 10 mg by mouth 3 (three) times daily as needed for muscle spasms.    [name] diltiazem (CARDIZEM) 30 MG tablet Take  1 tablet every 4 hours AS NEEDED for AFIB heart rate >100 45 tablet 1  . ELIQUIS 5 MG TABS tablet TAKE 1 TABLET BY MOUTH TWICE DAILY 60 tablet 5  . flecainide (TAMBOCOR) 100 MG tablet TAKE 1 TABLET(100 MG) BY MOUTH TWICE DAILY 180 tablet 0  . HYDROcodone-acetaminophen (NORCO/VICODIN) 5-325 MG tablet Take 1 tablet by mouth as needed. Make take every 4 hours if needed    . lidocaine (LIDODERM) 5 % Apply patch to skin and leave on for 12 hours then remove - only uses as needed    . metoprolol succinate (TOPROL-XL) 25 MG 24 hr tablet TAKE 1 TABLET(25 MG) BY MOUTH DAILY 90 tablet 2  . Multiple Vitamins-Minerals  (IMMUNE SUPPORT VITAMIN C PO) Take by mouth daily. Has vitamin D in it    . triamcinolone cream (KENALOG) 0.1 % Apply 1 application topically daily as needed (for irritation).      No current facility-administered medications for this visit.    Allergies:   Patient has no known allergies.   Social History:  The patient  reports that he has quit smoking. His smoking use included cigarettes. He smoked 0.50 packs per day. His smokeless tobacco use includes snuff. He reports previous alcohol use. He reports previous drug use. Drug: Marijuana.   Family History:  The patient's  family history includes Diabetes in his mother.   ROS:  Please see the history of present illness.   All other systems are personally reviewed and negative.    Exam:    Vital Signs:  BP 125/88   Pulse (!) 56   Ht 6' (1.829 m)   Wt 280 lb (127 kg)   BMI 37.97 kg/m   Well sounding, alert and conversant   Labs/Other Tests and Data Reviewed:    Recent Labs: No results found for requested labs within last 8760 hours.   Wt Readings from Last 3 Encounters:  10/26/19 280 lb (127 kg)  10/23/19 286 lb (129.7 kg)  03/28/19 300 lb (136.1 kg)   Echo 01/25/19- EF 55%  ASSESSMENT & PLAN:    1.  Paroxysmal atrial fibrillation/ atrial flutter The patient has symptomatic, recurrent paroxysmal atrial fibrillation. He did well post ablation in 2018.  He has had documented recurrence of afib. he has failed medical therapy with amiodarone, flecainide, and metoprolol. Chads2vasc score is 0.  he is anticoagulated with eliquis . Therapeutic strategies for afib including medicine and ablation were discussed in detail with the patient today. Risk, benefits, and alternatives to EP study and radiofrequency ablation for afib were also discussed in detail today. These risks include but are not limited to stroke, bleeding, vascular damage, tamponade, perforation, damage to the esophagus, lungs, and other structures, pulmonary vein  stenosis, worsening renal function, and death. The patient understands these risk and wishes to proceed.  We will therefore proceed with catheter ablation at the next available time.  Carto, ICE, anesthesia are requested for the procedure.  Will also obtain cardiac CT prior to the procedure to exclude LAA thrombus and further evaluate atrial anatomy.  2. Morbid obesity I have reviewed the patients BMI and decreased success rates with ablation at length today.  Weight loss is strongly advised.  Per Guijian et al (PACE 2013; 36: 748-756), patients with BMI 25-29.9 (obese) have a 27% increase in AF recurrence post ablation.  Patients with BMI >30 have a 31% increase in AF recurrence post ablation when compared to those with BMI <25.  3. Previous tachycardia mediated   CM Resolved post ablation of afib  Patient Risk:  after full review of this patients clinical status, I feel that they are at high risk at this time.  Today, I have spent 20 minutes with the patient with telehealth technology discussing arrhythmia management .    Randolm Idol, MD  10/26/2019 1:06 PM     [facility] [address] [place] [address] [phone] (office) [phone] (fax)

## 2019-11-01 NOTE — Telephone Encounter (Signed)
Patient's wife calling because she states that they were told he could take 1 valium before his procedure but to give Nathaniel [name] a call if they decide to do so.

## 2019-11-01 NOTE — Telephone Encounter (Signed)
Pts girlfriend, Ladona Ridgel, with his approval to talk with her.. called to report the pt is very anxious about the CT and having a bad experience with an MRI in the past.. he does not want to be restrained or put "inside" anything.. I have advised her the machine is a "donut" like machine and he will not be restrained or inside of anything. We do not prescribe valium but he may want to try and he will see the CT machine at the time of his test. She verbalized understanding and she agrees.

## 2019-11-02 ENCOUNTER — Encounter (HOSPITAL_COMMUNITY): Payer: Self-pay

## 2019-11-02 ENCOUNTER — Telehealth (HOSPITAL_COMMUNITY): Payer: Self-pay | Admitting: Emergency Medicine

## 2019-11-02 NOTE — Telephone Encounter (Signed)
Left message on voicemail with name and callback number Eriverto Byrnes RN Navigator Cardiac Imaging [facility] [phone] Office [phone] Cell  

## 2019-11-05 ENCOUNTER — Encounter (HOSPITAL_COMMUNITY): Payer: Self-pay

## 2019-11-05 ENCOUNTER — Ambulatory Visit (HOSPITAL_COMMUNITY)
Admission: RE | Admit: 2019-11-05 | Discharge: 2019-11-05 | Disposition: A | Discharge status: Home / Self Care | Payer: BC Managed Care – PPO | Source: Ambulatory Visit | Attending: Internal Medicine | Admitting: Internal Medicine

## 2019-11-05 ENCOUNTER — Other Ambulatory Visit: Payer: Self-pay

## 2019-11-05 DIAGNOSIS — I4891 Unspecified atrial fibrillation: Secondary | ICD-10-CM | POA: Diagnosis not present

## 2019-11-05 MED ORDER — IOHEXOL 300 MG/ML  SOLN
80.0000 mL | Freq: Once | INTRAMUSCULAR | Status: AC | PRN
  Administered 2019-11-05: 80 mL via INTRAVENOUS

## 2019-11-06 ENCOUNTER — Other Ambulatory Visit: Payer: BC Managed Care – PPO

## 2019-11-06 ENCOUNTER — Other Ambulatory Visit (HOSPITAL_COMMUNITY)
Admission: RE | Admit: 2019-11-06 | Discharge: 2019-11-06 | Disposition: A | Discharge status: Home / Self Care | Payer: BC Managed Care – PPO | Source: Ambulatory Visit | Attending: Internal Medicine | Admitting: Internal Medicine

## 2019-11-06 ENCOUNTER — Other Ambulatory Visit: Payer: Self-pay

## 2019-11-06 DIAGNOSIS — Z20822 Contact with and (suspected) exposure to covid-19: Secondary | ICD-10-CM | POA: Diagnosis not present

## 2019-11-06 DIAGNOSIS — I4891 Unspecified atrial fibrillation: Secondary | ICD-10-CM

## 2019-11-06 DIAGNOSIS — Z01812 Encounter for preprocedural laboratory examination: Secondary | ICD-10-CM | POA: Diagnosis present

## 2019-11-06 LAB — CBC WITH DIFFERENTIAL/PLATELET
Abs Immature Granulocytes: 0.02 10*3/uL (ref 0.00–0.07)
Basophils Absolute: 0.1 10*3/uL (ref 0.0–0.1)
Basophils Relative: 1 %
Eosinophils Absolute: 0.2 10*3/uL (ref 0.0–0.5)
Eosinophils Relative: 2 %
HCT: 42.8 % (ref 39.0–52.0)
Hemoglobin: 14 g/dL (ref 13.0–17.0)
Immature Granulocytes: 0 %
Lymphocytes Relative: 26 %
Lymphs Abs: 1.7 10*3/uL (ref 0.7–4.0)
MCH: 29.9 pg (ref 26.0–34.0)
MCHC: 32.7 g/dL (ref 30.0–36.0)
MCV: 91.5 fL (ref 80.0–100.0)
Monocytes Absolute: 0.4 10*3/uL (ref 0.1–1.0)
Monocytes Relative: 6 %
Neutro Abs: 4.1 10*3/uL (ref 1.7–7.7)
Neutrophils Relative %: 65 %
Platelets: 274 10*3/uL (ref 150–400)
RBC: 4.68 MIL/uL (ref 4.22–5.81)
RDW: 12.4 % (ref 11.5–15.5)
WBC: 6.4 10*3/uL (ref 4.0–10.5)
nRBC: 0 % (ref 0.0–0.2)

## 2019-11-07 LAB — SARS CORONAVIRUS 2 (TAT 6-24 HRS): SARS Coronavirus 2: NEGATIVE

## 2019-11-08 NOTE — Progress Notes (Signed)
Attempted to call patient regarding instructions for procedure tomorrow.  No answer 

## 2019-11-08 NOTE — Anesthesia Preprocedure Evaluation (Addendum)
Anesthesia Evaluation  Patient identified by MRN, date of birth, ID band Patient awake    Reviewed: Allergy & Precautions, H&P , NPO status , Patient's Chart, lab work & pertinent test results, reviewed documented beta blocker date and time   Airway Mallampati: III  TM Distance: >3 FB Neck ROM: Full    Dental no notable dental hx. (+) Teeth Intact, Dental Advisory Given   Pulmonary neg pulmonary ROS, former smoker,    Pulmonary exam normal breath sounds clear to auscultation       Cardiovascular Exercise Tolerance: Good hypertension, Pt. on medications and Pt. on home beta blockers +CHF  + dysrhythmias Atrial Fibrillation  Rhythm:Regular Rate:Normal     Neuro/Psych  Headaches, Depression    GI/Hepatic negative GI ROS, Neg liver ROS,   Endo/Other  Morbid obesity  Renal/GU negative Renal ROS  negative genitourinary   Musculoskeletal   Abdominal   Peds  Hematology negative hematology ROS (+)   Anesthesia Other Findings   Reproductive/Obstetrics negative OB ROS                            Anesthesia Physical Anesthesia Plan  ASA: III  Anesthesia Plan: General   Post-op Pain Management:    Induction: Intravenous  PONV Risk Score and Plan: 3 and Ondansetron, Dexamethasone and Midazolam  Airway Management Planned: Oral ETT  Additional Equipment:   Intra-op Plan:   Post-operative Plan: Extubation in OR  Informed Consent: I have reviewed the patients History and Physical, chart, labs and discussed the procedure including the risks, benefits and alternatives for the proposed anesthesia with the patient or authorized representative who has indicated his/her understanding and acceptance.     Dental advisory given  Plan Discussed with: CRNA  Anesthesia Plan Comments:         Anesthesia Quick Evaluation

## 2019-11-09 ENCOUNTER — Encounter (HOSPITAL_COMMUNITY)
Admission: RE | Disposition: A | Discharge status: Home / Self Care | Payer: Self-pay | Source: Home / Self Care | Attending: Internal Medicine

## 2019-11-09 ENCOUNTER — Ambulatory Visit (HOSPITAL_COMMUNITY)
Admission: RE | Admit: 2019-11-09 | Discharge: 2019-11-09 | Disposition: A | Discharge status: Home / Self Care | Payer: BC Managed Care – PPO | Attending: Internal Medicine | Admitting: Internal Medicine

## 2019-11-09 ENCOUNTER — Other Ambulatory Visit: Payer: Self-pay

## 2019-11-09 ENCOUNTER — Ambulatory Visit (HOSPITAL_COMMUNITY): Payer: BC Managed Care – PPO | Admitting: Certified Registered Nurse Anesthetist

## 2019-11-09 ENCOUNTER — Other Ambulatory Visit: Payer: Self-pay | Admitting: Internal Medicine

## 2019-11-09 DIAGNOSIS — Z6837 Body mass index (BMI) 37.0-37.9, adult: Secondary | ICD-10-CM | POA: Insufficient documentation

## 2019-11-09 DIAGNOSIS — I483 Typical atrial flutter: Secondary | ICD-10-CM

## 2019-11-09 DIAGNOSIS — K760 Fatty (change of) liver, not elsewhere classified: Secondary | ICD-10-CM | POA: Insufficient documentation

## 2019-11-09 DIAGNOSIS — F329 Major depressive disorder, single episode, unspecified: Secondary | ICD-10-CM | POA: Insufficient documentation

## 2019-11-09 DIAGNOSIS — I480 Paroxysmal atrial fibrillation: Secondary | ICD-10-CM | POA: Insufficient documentation

## 2019-11-09 DIAGNOSIS — Z87891 Personal history of nicotine dependence: Secondary | ICD-10-CM | POA: Insufficient documentation

## 2019-11-09 DIAGNOSIS — Z79899 Other long term (current) drug therapy: Secondary | ICD-10-CM | POA: Diagnosis not present

## 2019-11-09 DIAGNOSIS — I5022 Chronic systolic (congestive) heart failure: Secondary | ICD-10-CM | POA: Insufficient documentation

## 2019-11-09 DIAGNOSIS — I4892 Unspecified atrial flutter: Secondary | ICD-10-CM | POA: Insufficient documentation

## 2019-11-09 DIAGNOSIS — Z7901 Long term (current) use of anticoagulants: Secondary | ICD-10-CM | POA: Diagnosis not present

## 2019-11-09 DIAGNOSIS — I420 Dilated cardiomyopathy: Secondary | ICD-10-CM | POA: Diagnosis not present

## 2019-11-09 DIAGNOSIS — I110 Hypertensive heart disease with heart failure: Secondary | ICD-10-CM | POA: Diagnosis not present

## 2019-11-09 DIAGNOSIS — E876 Hypokalemia: Secondary | ICD-10-CM | POA: Diagnosis not present

## 2019-11-09 HISTORY — PX: ATRIAL FIBRILLATION ABLATION: EP1191

## 2019-11-09 LAB — BASIC METABOLIC PANEL
Anion gap: 10 (ref 5–15)
BUN: 11 mg/dL (ref 6–20)
CO2: 28 mmol/L (ref 22–32)
Calcium: 9 mg/dL (ref 8.9–10.3)
Chloride: 105 mmol/L (ref 98–111)
Creatinine, Ser: 0.72 mg/dL (ref 0.61–1.24)
GFR calc Af Amer: >60 mL/min (ref >60)
GFR calc non Af Amer: >60 mL/min (ref >60)
Glucose, Bld: 120 mg/dL — ABNORMAL HIGH (ref 70–99)
Potassium: 3.2 mmol/L — ABNORMAL LOW (ref 3.5–5.1)
Sodium: 143 mmol/L (ref 135–145)

## 2019-11-09 LAB — POCT ACTIVATED CLOTTING TIME: Activated Clotting Time: 224 seconds

## 2019-11-09 SURGERY — ATRIAL FIBRILLATION ABLATION
Anesthesia: General

## 2019-11-09 MED ORDER — ISOPROTERENOL HCL 0.2 MG/ML IJ SOLN
INTRAMUSCULAR | Status: AC
  Filled 2019-11-09: qty 5

## 2019-11-09 MED ORDER — ADENOSINE 6 MG/2ML IV SOLN
INTRAVENOUS | Status: DC | PRN
  Administered 2019-11-09 (×2): 12 mg via INTRAVENOUS

## 2019-11-09 MED ORDER — HYDROCODONE-ACETAMINOPHEN 5-325 MG PO TABS: 1.0000 | ORAL_TABLET | ORAL | Status: DC | PRN

## 2019-11-09 MED ORDER — LIDOCAINE 2% (20 MG/ML) 5 ML SYRINGE
INTRAMUSCULAR | Status: DC | PRN
  Administered 2019-11-09: 60 mg via INTRAVENOUS

## 2019-11-09 MED ORDER — PHENYLEPHRINE HCL-NACL 10-0.9 MG/250ML-% IV SOLN
INTRAVENOUS | Status: DC | PRN
  Administered 2019-11-09: 15 ug/min via INTRAVENOUS

## 2019-11-09 MED ORDER — DEXAMETHASONE SODIUM PHOSPHATE 10 MG/ML IJ SOLN
INTRAMUSCULAR | Status: DC | PRN
  Administered 2019-11-09: 5 mg via INTRAVENOUS

## 2019-11-09 MED ORDER — ADENOSINE 6 MG/2ML IV SOLN
INTRAVENOUS | Status: AC
  Filled 2019-11-09: qty 8

## 2019-11-09 MED ORDER — PANTOPRAZOLE SODIUM 40 MG PO TBEC: 40.0000 mg | DELAYED_RELEASE_TABLET | Freq: Every day | ORAL | 0 refills | Status: DC

## 2019-11-09 MED ORDER — POTASSIUM CHLORIDE 10 MEQ/50ML IV SOLN
10.0000 meq | INTRAVENOUS | Status: AC
  Filled 2019-11-09 (×2): qty 50

## 2019-11-09 MED ORDER — ISOPROTERENOL HCL 0.2 MG/ML IJ SOLN
INTRAVENOUS | Status: DC | PRN
  Administered 2019-11-09: 20 ug/min via INTRAVENOUS

## 2019-11-09 MED ORDER — SODIUM CHLORIDE 0.9% FLUSH: 3.0000 mL | Freq: Two times a day (BID) | INTRAVENOUS | Status: DC

## 2019-11-09 MED ORDER — ACETAMINOPHEN 500 MG PO TABS
1000.0000 mg | ORAL_TABLET | Freq: Once | ORAL | Status: AC
  Administered 2019-11-09: 1000 mg via ORAL
  Filled 2019-11-09: qty 2

## 2019-11-09 MED ORDER — ACETAMINOPHEN 325 MG PO TABS: 650.0000 mg | ORAL_TABLET | ORAL | Status: DC | PRN

## 2019-11-09 MED ORDER — SODIUM CHLORIDE 0.9 % IV SOLN: INTRAVENOUS | Status: DC | PRN

## 2019-11-09 MED ORDER — HEPARIN (PORCINE) IN NACL 1000-0.9 UT/500ML-% IV SOLN
INTRAVENOUS | Status: AC
  Filled 2019-11-09: qty 500

## 2019-11-09 MED ORDER — MIDAZOLAM HCL 5 MG/5ML IJ SOLN
INTRAMUSCULAR | Status: DC | PRN
  Administered 2019-11-09: 2 mg via INTRAVENOUS

## 2019-11-09 MED ORDER — PROPOFOL 10 MG/ML IV BOLUS
INTRAVENOUS | Status: DC | PRN
  Administered 2019-11-09: 150 mg via INTRAVENOUS

## 2019-11-09 MED ORDER — SUGAMMADEX SODIUM 200 MG/2ML IV SOLN
INTRAVENOUS | Status: DC | PRN
  Administered 2019-11-09: 250 mg via INTRAVENOUS

## 2019-11-09 MED ORDER — ONDANSETRON HCL 4 MG/2ML IJ SOLN: 4.0000 mg | Freq: Four times a day (QID) | INTRAMUSCULAR | Status: DC | PRN

## 2019-11-09 MED ORDER — PROTAMINE SULFATE 10 MG/ML IV SOLN
INTRAVENOUS | Status: DC | PRN
  Administered 2019-11-09: 20 mg via INTRAVENOUS
  Administered 2019-11-09: 10 mg via INTRAVENOUS

## 2019-11-09 MED ORDER — HEPARIN SODIUM (PORCINE) 1000 UNIT/ML IJ SOLN
INTRAMUSCULAR | Status: DC | PRN
  Administered 2019-11-09: 14000 [IU] via INTRAVENOUS
  Administered 2019-11-09: 1000 [IU] via INTRAVENOUS

## 2019-11-09 MED ORDER — ROCURONIUM BROMIDE 50 MG/5ML IV SOSY
PREFILLED_SYRINGE | INTRAVENOUS | Status: DC | PRN
  Administered 2019-11-09: 70 mg via INTRAVENOUS
  Administered 2019-11-09: 20 mg via INTRAVENOUS

## 2019-11-09 MED ORDER — FENTANYL CITRATE (PF) 250 MCG/5ML IJ SOLN
INTRAMUSCULAR | Status: DC | PRN
  Administered 2019-11-09: 100 ug via INTRAVENOUS

## 2019-11-09 MED ORDER — POTASSIUM CHLORIDE 10 MEQ/100ML IV SOLN
INTRAVENOUS | Status: DC | PRN
  Administered 2019-11-09 (×2): 10 meq via INTRAVENOUS

## 2019-11-09 MED ORDER — HEPARIN SODIUM (PORCINE) 1000 UNIT/ML IJ SOLN
INTRAMUSCULAR | Status: DC | PRN
  Administered 2019-11-09: 5000 [IU] via INTRAVENOUS

## 2019-11-09 MED ORDER — SODIUM CHLORIDE 0.9% FLUSH: 3.0000 mL | INTRAVENOUS | Status: DC | PRN

## 2019-11-09 MED ORDER — SODIUM CHLORIDE 0.9 % IV SOLN: INTRAVENOUS | Status: DC

## 2019-11-09 MED ORDER — ONDANSETRON HCL 4 MG/2ML IJ SOLN
INTRAMUSCULAR | Status: DC | PRN
  Administered 2019-11-09: 4 mg via INTRAVENOUS

## 2019-11-09 MED ORDER — APIXABAN 5 MG PO TABS
5.0000 mg | ORAL_TABLET | Freq: Once | ORAL | Status: AC
  Administered 2019-11-09: 5 mg via ORAL
  Filled 2019-11-09: qty 1

## 2019-11-09 MED ORDER — HEPARIN SODIUM (PORCINE) 1000 UNIT/ML IJ SOLN
INTRAMUSCULAR | Status: AC
  Filled 2019-11-09: qty 2

## 2019-11-09 MED ORDER — SODIUM CHLORIDE 0.9 % IV SOLN: 250.0000 mL | INTRAVENOUS | Status: DC | PRN

## 2019-11-09 MED ORDER — ACETAMINOPHEN 500 MG PO TABS
ORAL_TABLET | ORAL | Status: AC
  Filled 2019-11-09: qty 2

## 2019-11-09 MED ORDER — HEPARIN (PORCINE) IN NACL 1000-0.9 UT/500ML-% IV SOLN
INTRAVENOUS | Status: DC | PRN
  Administered 2019-11-09: 500 mL

## 2019-11-09 SURGICAL SUPPLY — 20 items
BLANKET WARM UNDERBOD FULL ACC (MISCELLANEOUS) ×2 IMPLANT
CATH 8FR REPROCESSED SOUNDSTAR (CATHETERS) ×2 IMPLANT
CATH MAPPNG PENTARAY F 2-6-2MM (CATHETERS) ×1 IMPLANT
CATH SMTCH THERMOCOOL SF DF (CATHETERS) ×2 IMPLANT
CATH WEBSTER BI DIR CS D-F CRV (CATHETERS) ×2 IMPLANT
COVER SWIFTLINK CONNECTOR (BAG) ×2 IMPLANT
DEVICE CLOSURE PERCLS PRGLD 6F (VASCULAR PRODUCTS) ×3 IMPLANT
HOVERMATT SINGLE USE (MISCELLANEOUS) ×2 IMPLANT
NEEDLE BAYLIS TRANSSEPTAL 71CM (NEEDLE) ×2 IMPLANT
PACK EP LATEX FREE (CUSTOM PROCEDURE TRAY) ×1
PACK EP LF (CUSTOM PROCEDURE TRAY) ×1 IMPLANT
PAD PRO RADIOLUCENT 2001M-C (PAD) ×2 IMPLANT
PATCH CARTO3 (PAD) ×4 IMPLANT
PENTARAY F 2-6-2MM (CATHETERS) ×2
PERCLOSE PROGLIDE 6F (VASCULAR PRODUCTS) ×6
SHEATH PINNACLE 7F 10CM (SHEATH) ×4 IMPLANT
SHEATH PINNACLE 9F 10CM (SHEATH) ×2 IMPLANT
SHEATH PROBE COVER 6X72 (BAG) ×2 IMPLANT
SHEATH SWARTZ TS SL2 63CM 8.5F (SHEATH) ×2 IMPLANT
TUBING SMART ABLATE COOLFLOW (TUBING) ×2 IMPLANT

## 2019-11-09 NOTE — Interval H&P Note (Signed)
History and Physical Interval Note:  11/09/2019 7:24 AM  Nathaniel [name]  has presented today for surgery, with the diagnosis of Atrial fibrillation.  The various methods of treatment have been discussed with the patient and family. After consideration of risks, benefits and other options for treatment, the patient has consented to  Procedure(s): ATRIAL FIBRILLATION ABLATION (N/A) as a surgical intervention.  The patient's history has been reviewed, patient examined, no change in status, stable for surgery.  I have reviewed the patient's chart and labs.  Questions were answered to the patient's satisfaction.     Cardiac CT reviewed with patient in detail today. He reports compliance with eliquis without interruption  Hillis Range

## 2019-11-09 NOTE — Transfer of Care (Signed)
Immediate Anesthesia Transfer of Care Note  Patient: Nathaniel [name]  Procedure(s) Performed: ATRIAL FIBRILLATION ABLATION (N/A )  Patient Location: Cath Lab  Anesthesia Type:General  Level of Consciousness: awake, alert , oriented, patient cooperative and responds to stimulation  Airway & Oxygen Therapy: Patient Spontanous Breathing and Patient connected to nasal cannula oxygen  Post-op Assessment: Report given to RN, Post -op Vital signs reviewed and stable and Patient moving all extremities X 4  Post vital signs: Reviewed and stable  Last Vitals:  Vitals Value Taken Time  BP 101/42 11/09/19 1041  Temp    Pulse 85 11/09/19 1044  Resp 19 11/09/19 1044  SpO2 99 % 11/09/19 1044  Vitals shown include unvalidated device data.  Last Pain:  Vitals:   11/09/19 0550  TempSrc:   PainSc: 0-No pain      Patients Stated Pain Goal: 2 (11/09/19 0550)  Complications: No apparent anesthesia complications

## 2019-11-09 NOTE — Discharge Instructions (Signed)
No driving for 4 days. No lifting over 5 lbs for 1 week. No sexual activity for 1 week. You may return to work in 1 week. Keep procedure site clean & dry. If you notice increased pain, swelling, bleeding or pus, call/return!  You may shower, but no soaking baths/hot tubs/pools for 1 week.   May return to work without restrictions on 11/16/19 Cardiac Ablation, Care After  This sheet gives you information about how to care for yourself after your procedure. Your health care provider may also give you more specific instructions. If you have problems or questions, contact your health care provider. What can I expect after the procedure? After the procedure, it is common to have:  Bruising around your puncture site.  Tenderness around your puncture site.  Skipped heartbeats.  Tiredness (fatigue).  Follow these instructions at home: Puncture site care   Follow instructions from your health care provider about how to take care of your puncture site. Make sure you: ? If present, leave stitches (sutures), skin glue, or adhesive strips in place. These skin closures may need to stay in place for up to 2 weeks. If adhesive strip edges start to loosen and curl up, you may trim the loose edges. Do not remove adhesive strips completely unless your health care provider tells you to do that.  Check your puncture site every day for signs of infection. Check for: ? Redness, swelling, or pain. ? Fluid or blood. If your puncture site starts to bleed, lie down on your back, apply firm pressure to the area, and contact your health care provider. ? Warmth. ? Pus or a bad smell. Driving  Do not drive for at least 4 days after your procedure or however long your health care provider recommends. (Do not resume driving if you have previously been instructed not to drive for other health reasons.)  Do not drive or use heavy machinery while taking prescription pain medicine. Activity  Avoid activities that take  a lot of effort for at least 7 days after your procedure.  Do not lift anything that is heavier than 5 lb (4.5 kg) for one week.   No sexual activity for 1 week.   Return to your normal activities as told by your health care provider. Ask your health care provider what activities are safe for you. General instructions  Take over-the-counter and prescription medicines only as told by your health care provider.  Do not use any products that contain nicotine or tobacco, such as cigarettes and e-cigarettes. If you need help quitting, ask your health care provider.  You may shower after 24 hours, but Do not take baths, swim, or use a hot tub for 1 week.   Do not drink alcohol for 24 hours after your procedure.  Keep all follow-up visits as told by your health care provider. This is important. Contact a health care provider if:  You have redness, mild swelling, or pain around your puncture site.  You have fluid or blood coming from your puncture site that stops after applying firm pressure to the area.  Your puncture site feels warm to the touch.  You have pus or a bad smell coming from your puncture site.  You have a fever.  You have chest pain or discomfort that spreads to your neck, jaw, or arm.  You are sweating a lot.  You feel nauseous.  You have a fast or irregular heartbeat.  You have shortness of breath.  You are dizzy or  light-headed and feel the need to lie down.  You have pain or numbness in the arm or leg closest to your puncture site. Get help right away if:  Your puncture site suddenly swells.  Your puncture site is bleeding and the bleeding does not stop after applying firm pressure to the area. These symptoms may represent a serious problem that is an emergency. Do not wait to see if the symptoms will go away. Get medical help right away. Call your local emergency services (911 in the [place] Do not drive yourself to the hospital. Summary  After the  procedure, it is normal to have bruising and tenderness at the puncture site in your groin, neck, or forearm.  Check your puncture site every day for signs of infection.  Get help right away if your puncture site is bleeding and the bleeding does not stop after applying firm pressure to the area. This is a medical emergency. This information is not intended to replace advice given to you by your health care provider. Make sure you discuss any questions you have with your health care provider.

## 2019-11-09 NOTE — Anesthesia Procedure Notes (Signed)
Procedure Name: Intubation Date/Time: 11/09/2019 7:49 AM Performed by: Glynda Jaeger, CRNA Pre-anesthesia Checklist: Patient identified, Patient being monitored, Timeout performed, Emergency Drugs available and Suction available Patient Re-evaluated:Patient Re-evaluated prior to induction Oxygen Delivery Method: Circle System Utilized Preoxygenation: Pre-oxygenation with 100% oxygen Induction Type: IV induction Ventilation: Mask ventilation without difficulty Laryngoscope Size: Mac and 4 Grade View: Grade II Tube type: Oral Tube size: 7.5 mm Number of attempts: 1 Airway Equipment and Method: Stylet Placement Confirmation: ETT inserted through vocal cords under direct vision,  positive ETCO2 and breath sounds checked- equal and bilateral Secured at: 22 cm Tube secured with: Tape Dental Injury: Teeth and Oropharynx as per pre-operative assessment

## 2019-11-09 NOTE — Anesthesia Postprocedure Evaluation (Signed)
Anesthesia Post Note  Patient: Nathaniel [name]  Procedure(s) Performed: ATRIAL FIBRILLATION ABLATION (N/A )     Anesthesia Post Evaluation  Last Vitals:  Vitals:   11/09/19 1336 11/09/19 1406  BP: (!) 96/54 100/66  Pulse: 71 69  Resp: 15 16  Temp:    SpO2: 96% 97%    Last Pain:  Vitals:   11/09/19 1221  TempSrc:   PainSc: 0-No pain                 Ashkan Chamberland,W. EDMOND

## 2019-11-27 ENCOUNTER — Ambulatory Visit (HOSPITAL_COMMUNITY): Payer: BC Managed Care – PPO | Admitting: Nurse Practitioner

## 2019-11-28 ENCOUNTER — Ambulatory Visit (HOSPITAL_COMMUNITY)
Admission: RE | Admit: 2019-11-28 | Discharge: 2019-11-28 | Disposition: A | Discharge status: Home / Self Care | Payer: BC Managed Care – PPO | Source: Ambulatory Visit | Attending: Nurse Practitioner | Admitting: Nurse Practitioner

## 2019-11-28 ENCOUNTER — Encounter (HOSPITAL_COMMUNITY): Payer: Self-pay | Admitting: Nurse Practitioner

## 2019-11-28 ENCOUNTER — Other Ambulatory Visit: Payer: Self-pay

## 2019-11-28 VITALS — BP 110/70 | HR 74 | Ht 72.0 in | Wt 288.2 lb

## 2019-11-28 DIAGNOSIS — G8929 Other chronic pain: Secondary | ICD-10-CM | POA: Diagnosis not present

## 2019-11-28 DIAGNOSIS — R9431 Abnormal electrocardiogram [ECG] [EKG]: Secondary | ICD-10-CM | POA: Diagnosis not present

## 2019-11-28 DIAGNOSIS — I4892 Unspecified atrial flutter: Secondary | ICD-10-CM | POA: Diagnosis not present

## 2019-11-28 DIAGNOSIS — D6869 Other thrombophilia: Secondary | ICD-10-CM

## 2019-11-28 DIAGNOSIS — I480 Paroxysmal atrial fibrillation: Secondary | ICD-10-CM | POA: Diagnosis present

## 2019-11-28 DIAGNOSIS — K861 Other chronic pancreatitis: Secondary | ICD-10-CM | POA: Diagnosis not present

## 2019-11-28 DIAGNOSIS — Z87891 Personal history of nicotine dependence: Secondary | ICD-10-CM | POA: Diagnosis not present

## 2019-11-28 DIAGNOSIS — Z8616 Personal history of covid-19: Secondary | ICD-10-CM | POA: Insufficient documentation

## 2019-11-28 DIAGNOSIS — I5022 Chronic systolic (congestive) heart failure: Secondary | ICD-10-CM | POA: Diagnosis not present

## 2019-11-28 DIAGNOSIS — Z6841 Body Mass Index (BMI) 40.0 and over, adult: Secondary | ICD-10-CM | POA: Diagnosis not present

## 2019-11-28 DIAGNOSIS — M545 Low back pain: Secondary | ICD-10-CM | POA: Insufficient documentation

## 2019-11-28 DIAGNOSIS — Z7901 Long term (current) use of anticoagulants: Secondary | ICD-10-CM | POA: Insufficient documentation

## 2019-11-28 DIAGNOSIS — F410 Panic disorder [episodic paroxysmal anxiety] without agoraphobia: Secondary | ICD-10-CM | POA: Insufficient documentation

## 2019-11-28 DIAGNOSIS — Z79899 Other long term (current) drug therapy: Secondary | ICD-10-CM | POA: Insufficient documentation

## 2019-11-28 DIAGNOSIS — E669 Obesity, unspecified: Secondary | ICD-10-CM | POA: Insufficient documentation

## 2019-11-28 NOTE — Progress Notes (Signed)
Primary Care Physician: Neale Burly, MD Referring Physician: Dr. Nolberto Hanlon Nathaniel is a 29 y.o. [name] with a h/o paroxysmal afib that is in the afib clinic for increased palpitations. He had a afib ablation in 2018. He has been maintained with flecainide and metoprolol. Is on eliquis 5 mg bid for CHA2DS2VASc score of 2. He has noted increased palpitations since he was hospitalized with acute on chronic  Pancreatitis 10/11/19. [name]  He had a bad episode at work the other night but today is in [name]. He also had covid last summer at which time he has increase of afib episodes.  He is interested in redo ablation if needed.   F/u in afib clinic, 11/28/19. He had redo  ablation 3/12 but sent a my chart message that he was having issues with intermittent afib and asked to  be seen. On arrival, he was in [name]. Pt states that he is scared to go to work as he gets so apprehensive when he goes into afib that he cant function.. His 30 mg Cardizem tab will usually convert him quickly. He has issues with anxiety and panic attacks. He mostly will feel anxiety and then the afib kicks in. He presented in [place] but while talking about his anxiety he went into afib by ausculation. No swallowing or groin issues since the procedure. We discussed today as well, that some afib after the procedure can be expected.   Today, he denies symptoms of  shortness of breath, orthopnea, PND, lower extremity edema, dizziness, presyncope, syncope, or neurologic sequela. The patient is tolerating medications without difficulties and is otherwise without complaint today.   Past Medical History:  Diagnosis Date  . Atrial flutter (Combine) 01/17/2016   Admx 5/17 with AFlutter with RVR in setting of pancreatitis // CHADS2-VASc=1 (CHF) // Anticoag not started // Amiodarone >> NSR   . Chronic lower back pain   . Chronic systolic CHF (congestive heart failure) ([place])    Echo 01/17/16 - EF 35-40%, diffuse HK, mild LAE   . DCM (dilated  cardiomyopathy) ([place]) 02/03/2016   a. EF 35-40% in setting of AFlutter with RVR in 5/17 // probably tachycardia mediated // needs FU echo 2-3 mos after NSR restored // b. Limited Echo 7/17: EF 45-50%, diff HK, mild LAE  . Depression    "last 2-3 months; never treated for it" (12/09/2016)  . Fatty liver 12/2015   per CT  . Headache    "monthly" (12/09/2016)  . Obesity    BMI 45 12/2015  . Pancreatitis 2012   recurrent acute. Admissions to [facility], 12/2015 admissin to [facility] hospital, ct then shows pseudocyst.   . Pancreatitis 2012 X 2; 2017 X 2  . Paroxysmal atrial fibrillation (HCC)   . Typical atrial flutter ([place]) 06/06/2017   Past Surgical History:  Procedure Laterality Date  . ATRIAL FIBRILLATION ABLATION N/A 12/09/2016   Procedure: Atrial Fibrillation Ablation;  Surgeon: Thompson Grayer, MD;  Location: [place] [facility];  Service: Cardiovascular;  Laterality: N/A;  . ATRIAL FIBRILLATION ABLATION N/A 11/09/2019   Procedure: ATRIAL FIBRILLATION ABLATION;  Surgeon: Thompson Grayer, MD;  Location: [place] [facility];  Service: Cardiovascular;  Laterality: N/A;  . FRACTURE SURGERY    . HAND SURGERY Right ~ 2008   "had pins in it; pulled them"  . TEE WITHOUT CARDIOVERSION N/A 12/08/2016   Procedure: TRANSESOPHAGEAL ECHOCARDIOGRAM (TEE);  Surgeon: Jerline Pain, MD;  Location: [place];  Service: Cardiovascular;  Laterality: N/A;  .  TONSILLECTOMY AND ADENOIDECTOMY     "in my teens"    Current Outpatient Medications  Medication Sig Dispense Refill  . busPIRone (BUSPAR) 15 MG tablet Take 15 mg by mouth 2 (two) times daily.     . cyclobenzaprine (FLEXERIL) 10 MG tablet Take 10 mg by mouth 3 (three) times daily as needed for muscle spasms.    [name] diltiazem (CARDIZEM) 30 MG tablet Take 1 tablet every 4 hours AS NEEDED for AFIB heart rate >100 45 tablet 1  . ELIQUIS 5 MG TABS tablet TAKE 1 TABLET BY MOUTH TWICE DAILY 60 tablet 5  . flecainide (TAMBOCOR) 100 MG tablet TAKE 1 TABLET(100 MG) BY MOUTH TWICE  DAILY 180 tablet 0  . HYDROcodone-acetaminophen (NORCO/VICODIN) 5-325 MG tablet Take 1 tablet by mouth every 4 (four) hours as needed (pancreatitis). Make take every 4 hours if needed    . lidocaine (LIDODERM) 5 % Place 1 patch onto the skin daily as needed (pain.). Apply patch to skin and leave on for 12 hours then remove - only uses as needed    . Multiple Vitamins-Minerals (IMMUNE SUPPORT VITAMIN C PO) Take 1 tablet by mouth daily.     . pantoprazole (PROTONIX) 40 MG tablet Take 1 tablet (40 mg total) by mouth daily. 45 tablet 0  . triamcinolone cream (KENALOG) 0.1 % Apply 1 application topically daily as needed (for irritation).      No current facility-administered medications for this encounter.    No Known Allergies  Social History   Socioeconomic History  . Marital status: Married    Spouse name: Not on file  . Number of children: 0  . Years of education: 68  . Highest education level: Not on file  Occupational History    Employer: [employer]  Tobacco Use  . Smoking status: Former Smoker    Packs/day: 0.50    Types: Cigarettes  . Smokeless tobacco: Current User    Types: Snuff  Substance and Sexual Activity  . Alcohol use: Not Currently    Comment: occasional  . Drug use: Not Currently    Types: Marijuana    Comment: 12/09/2016 "daily from age 25 to 89"  . Sexual activity: Yes    Birth control/protection: None  Other Topics Concern  . Not on file  Social History Narrative   Pt lives in [place] with fiancee.  Works at [employer] in [place].   Social Determinants of Health   Financial Resource Strain:   . Difficulty of Paying Living Expenses:   Food Insecurity:   . Worried About Programme researcher, broadcasting/film/video in the Last Year:   . Barista in the Last Year:   Transportation Needs:   . Freight forwarder (Medical):   [name] Lack of Transportation (Non-Medical):   Physical Activity:   . Days of Exercise per Week:   . Minutes of Exercise per Session:     Stress:   . Feeling of Stress :   Social Connections:   . Frequency of Communication with Friends and Family:   . Frequency of Social Gatherings with Friends and Family:   . Attends Religious Services:   . Active Member of Clubs or Organizations:   . Attends Banker Meetings:   [name] Marital Status:   Intimate Partner Violence:   . Fear of Current or Ex-Partner:   . Emotionally Abused:   [name] Physically Abused:   . Sexually Abused:     Family History  Problem Relation Age of Onset  .  Diabetes Mother     ROS- All systems are reviewed and negative except as per the HPI above  Physical Exam: Vitals:   11/28/19 1534  BP: 110/70  Pulse: 74  Weight: 130.7 kg  Height: 6' (1.829 m)   Wt Readings from Last 3 Encounters:  11/28/19 130.7 kg  11/09/19 127 kg  10/26/19 127 kg    Labs: Lab Results  Component Value Date   NA 143 11/09/2019   K 3.2 (L) 11/09/2019   CL 105 11/09/2019   CO2 28 11/09/2019   GLUCOSE 120 (H) 11/09/2019   BUN 11 11/09/2019   CREATININE 0.72 11/09/2019   CALCIUM 9.0 11/09/2019   PHOS 3.8 05/09/2018   MG 2.1 05/09/2018   Lab Results  Component Value Date   INR 1.22 01/18/2016   Lab Results  Component Value Date   CHOL 116 05/11/2018   HDL 31 (L) 05/11/2018   LDLCALC 73 05/11/2018   TRIG 61 05/11/2018     GEN- The patient is well appearing, alert and oriented x 3 today.   Head- normocephalic, atraumatic Eyes-  Sclera clear, conjunctiva pink Ears- hearing intact Oropharynx- clear Neck- supple, no JVP Lymph- no cervical lymphadenopathy Lungs- Clear to ausculation bilaterally, normal work of breathing Heart- Regular rate and rhythm, no murmurs, rubs or gallops, PMI not laterally displaced GI- soft, NT, ND, + BS Extremities- no clubbing, cyanosis, or edema MS- no significant deformity or atrophy Skin- no rash or lesion Psych- euthymic mood, full affect Neuro- strength and sensation are intact  EKG- NSR, normal ekg Pr int  148 ms, qrs int 86 ms, qtc 432 ms   Assessment and Plan: 1. afib S/p ablation in 2018 and redo 3/12 Having some afib that is exacerbated by anxiety/panic attacks  He presented in [place] but went into afib while discussing his anxiety issues  He will take his 30 mg cardizxem that he has in his car and his wife will drive him home Reassured that some afib after ablation can be expected and reviewed to take short acting cardizem as needed  He plans to discuss with his PCP re seeing a therapist vrs change in his anxiety medicine  I also recommended a Kardia vrs apple watch  Continue  eliquis 5 mg bid for CHA2DS2VASc of 2   Has f/u in afib clinic 4/8  Lupita Leash C. [facility] [address] [place], [place] [address] [phone]

## 2019-11-29 DIAGNOSIS — I482 Chronic atrial fibrillation, unspecified: Secondary | ICD-10-CM | POA: Diagnosis not present

## 2019-11-29 DIAGNOSIS — F408 Other phobic anxiety disorders: Secondary | ICD-10-CM | POA: Diagnosis not present

## 2019-11-29 DIAGNOSIS — Z6838 Body mass index (BMI) 38.0-38.9, adult: Secondary | ICD-10-CM | POA: Diagnosis not present

## 2019-12-06 ENCOUNTER — Encounter (HOSPITAL_COMMUNITY): Payer: Self-pay

## 2019-12-06 ENCOUNTER — Ambulatory Visit (HOSPITAL_COMMUNITY): Payer: BC Managed Care – PPO | Admitting: Physician Assistant

## 2019-12-19 ENCOUNTER — Other Ambulatory Visit: Payer: Self-pay | Admitting: Internal Medicine

## 2019-12-27 ENCOUNTER — Other Ambulatory Visit: Payer: Self-pay | Admitting: *Deleted

## 2019-12-27 MED ORDER — FLECAINIDE ACETATE 100 MG PO TABS: ORAL_TABLET | ORAL | 1 refills | Status: DC

## 2020-01-18 ENCOUNTER — Other Ambulatory Visit: Payer: Self-pay | Admitting: Internal Medicine

## 2020-01-18 NOTE — Telephone Encounter (Signed)
Prescription refill request for Eliquis received.  Last office visit: Noralyn Pick 11/28/2019 Scr:  0.72, 11/09/2019 Age: 29 y.o. Weight: 130.7 kg   Prescription refill sent.

## 2020-02-12 ENCOUNTER — Telehealth: Payer: Self-pay

## 2020-02-12 NOTE — Telephone Encounter (Signed)
Left a message regarding virtual visit on 02/13/20. 

## 2020-02-13 ENCOUNTER — Other Ambulatory Visit: Payer: Self-pay

## 2020-02-13 ENCOUNTER — Telehealth (INDEPENDENT_AMBULATORY_CARE_PROVIDER_SITE_OTHER): Payer: BC Managed Care – PPO | Admitting: Internal Medicine

## 2020-02-13 ENCOUNTER — Telehealth: Payer: Self-pay | Admitting: *Deleted

## 2020-02-13 ENCOUNTER — Encounter: Payer: Self-pay | Admitting: Internal Medicine

## 2020-02-13 VITALS — BP 108/75 | HR 71 | Ht 72.0 in | Wt 290.0 lb

## 2020-02-13 DIAGNOSIS — I480 Paroxysmal atrial fibrillation: Secondary | ICD-10-CM

## 2020-02-13 DIAGNOSIS — D6869 Other thrombophilia: Secondary | ICD-10-CM | POA: Diagnosis not present

## 2020-02-13 DIAGNOSIS — I10 Essential (primary) hypertension: Secondary | ICD-10-CM

## 2020-02-13 NOTE — Progress Notes (Signed)
Electrophysiology TeleHealth Note   Due to national recommendations of social distancing due to COVID 19, an audio/video telehealth visit is felt to be most appropriate for this patient at this time.  See MyChart message from today for the patient's consent to telehealth for [facility].  Date:  02/13/2020   ID:  Nathaniel [name], DOB 1990-09-05, MRN [id number]  Location: patient's home  Provider location:  [place]  Evaluation Performed: Follow-up visit  PCP:  Toma Deiters, MD   Electrophysiologist:  Dr Johney Frame  Chief Complaint:  palpitations  History of Present Illness:    Nathaniel [name] is a 29 y.o. male who presents via telehealth conferencing today.  He reports that he has had frequent ERAF post ablation.  He has not been back to work.  Unfortunately, he has really not alerted our team to his issues.  We discussed at length today that I cannot write for work excuse if he is not closely following with our team.  Our intention is that he work very closely with [place] until his arrhythmia is controlled.  He also reports having anxiety and other medical issues.  Denies procedure related complications.   Today, he denies symptoms of exertional chest pain, shortness of breath,  lower extremity edema, dizziness, presyncope, or syncope.  The patient is otherwise without complaint today.   Past Medical History:  Diagnosis Date  . Atrial fibrillation (HCC)   . Atrial flutter (HCC) 01/17/2016   Admx 5/17 with AFlutter with RVR in setting of pancreatitis // CHADS2-VASc=1 (CHF) // Anticoag not started // Amiodarone >> NSR   . Cardiomyopathy (HCC)   . CHF (congestive heart failure) (HCC)   . Chronic lower back pain   . Chronic systolic CHF (congestive heart failure) (HCC)    Echo 01/17/16 - EF 35-40%, diffuse HK, mild LAE   . 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HCC) 02/03/2016   a. EF 35-40% in setting of AFlutter with RVR in 5/17 // probably tachycardia mediated // needs FU echo  2-3 mos after NSR restored // b. Limited Echo 7/17: EF 45-50%, diff HK, mild LAE  . Depression    "last 2-3 months; never treated for it" (12/09/2016)  . Fatty liver 12/2015   per CT  . Headache    "monthly" (12/09/2016)  . Obesity    BMI 45 12/2015  . Pancreatitis 2012   recurrent acute. Admissions to [facility], 12/2015 admissin to [facility] hospital, ct then shows pseudocyst.   . Pancreatitis 2012 X 2; 2017 X 2  . Paroxysmal atrial fibrillation (HCC)   . Typical atrial flutter (HCC) 06/06/2017    Past Surgical History:  Procedure Laterality Date  . ATRIAL FIBRILLATION ABLATION N/A 12/09/2016   Procedure: Atrial Fibrillation Ablation;  Surgeon: Hillis Range, MD;  [facility]: [facility] INVASIVE CV LAB;  Service: Cardiovascular;  Laterality: N/A;  . ATRIAL FIBRILLATION ABLATION N/A 11/09/2019   Procedure: ATRIAL FIBRILLATION ABLATION;  Surgeon: Hillis Range, MD;  Location: [facility] INVASIVE CV LAB;  Service: Cardiovascular;  Laterality: N/A;  . FRACTURE SURGERY    . HAND SURGERY Right ~ 2008   "had pins in it; pulled them"  . TEE WITHOUT CARDIOVERSION N/A 12/08/2016   Procedure: TRANSESOPHAGEAL ECHOCARDIOGRAM (TEE);  Surgeon: Jake Bathe, MD;  Location: [facility] ENDOSCOPY;  Service: Cardiovascular;  Laterality: N/A;  . TONSILLECTOMY AND ADENOIDECTOMY     "in my teens"    Current Outpatient Medications  Medication Sig Dispense Refill  . clonazePAM (KLONOPIN) 0.5 MG tablet Take 0.5 mg by  mouth 2 (two) times daily as needed for anxiety.    [name] diltiazem (CARDIZEM) 30 MG tablet Take 1 tablet every 4 hours AS NEEDED for AFIB heart rate >100 45 tablet 1  . ELIQUIS 5 MG TABS tablet TAKE 1 TABLET BY MOUTH TWICE DAILY 60 tablet 5  . flecainide (TAMBOCOR) 100 MG tablet TAKE 1 TABLET(100 MG) BY MOUTH TWICE DAILY 180 tablet 1  . lidocaine (LIDODERM) 5 % Place 1 patch onto the skin daily as needed (pain.). Apply patch to skin and leave on for 12 hours then remove - only uses as needed    . Multiple Vitamins-Minerals (IMMUNE SUPPORT  VITAMIN C PO) Take 1 tablet by mouth daily.     . pantoprazole (PROTONIX) 40 MG tablet TAKE 1 TABLET(40 MG) BY MOUTH DAILY 90 tablet 3  . triamcinolone cream (KENALOG) 0.1 % Apply 1 application topically daily as needed (for irritation).      No current facility-administered medications for this visit.    Allergies:   Patient has no known allergies.   Social History:  The patient  reports that he has quit smoking. His smoking use included cigarettes. He smoked 0.50 packs per day. His smokeless tobacco use includes snuff. He reports previous alcohol use. He reports previous drug use. Drug: Marijuana.   ROS:  Please see the history of present illness.   All other systems are personally reviewed and negative.    Exam:    Vital Signs:  BP 108/75   Pulse 71   Ht 6' (1.829 m)   Wt 290 lb (131.5 kg)   BMI 39.33 kg/m   Well sounding and appearing, alert and conversant, regular work of breathing,  good skin color Eyes- anicteric, neuro- grossly intact, skin- no apparent rash or lesions or cyanosis, mouth- oral mucosa is pink  Labs/Other Tests and Data Reviewed:    Recent Labs: 11/06/2019: Hemoglobin 14.0; Platelets 274 11/09/2019: BUN 11; Creatinine, Ser 0.72; Potassium 3.2; Sodium 143   Wt Readings from Last 3 Encounters:  02/13/20 290 lb (131.5 kg)  11/28/19 288 lb 3.2 oz (130.7 kg)  11/09/19 280 lb (127 kg)       ASSESSMENT & PLAN:    1.  Paroxysmal atrial fibrillation His afib has been challenging.  I have again today advised ILR to better characterize and manage his afib.  We have discussed at length that he should return to work and that if he is feeling so poorly that he cannot work that 1. We need to better document his afib events and 2. He needs to follow very closely with [place] until his arrhythmia is better controlled.  With his recent ablation, there was return of conduction after adenosine in the RSPV however the other three PVs were quiescent.  CTI ablation was performed.     At this point, I think that we should consider tikosyn as our next option.  We discussed at length today.  We also discussed the literature related to lifestyle modification.  I worry that his BMI is not working in our favor.  He has recurring pancreatitis but denies ETOH.  His previous sleep study was normal.  After long discussion today, we both agree that a second opinion may be beneficial.  I will refer to Dr Gerlene Burdock at [facility].     He will follow closely in the AF clinic also.  I have advised that he return in 2 weeks for consideration of tikosyn.  Risks, benefits and potential toxicities for medications prescribed  and/or refilled reviewed with patient today.  He will require close follow-up on flecainide to avoid toxicity. Continue eliquis for chads2vasc score of 1-2  2. Obesity Body mass index is 39.33 kg/m.' Lifestyle modificaiton advised  3. Previous tachycardia mediated CM Improved previously with sinus We really should try to keep him in sinus long term.  4. HTN Stable No change required today     Patient Risk:  after full review of this patients clinical status, I feel that they are at moderate risk at this time.  Today, I have spent 15 minutes with the patient with telehealth technology discussing arrhythmia management .    Randolm Idol, MD  02/13/2020 2:38 PM     [facility] [address] [place] [address] [phone] (office) [phone] (fax)

## 2020-02-13 NOTE — Telephone Encounter (Signed)
-----   Message from Hillis Range, MD sent at 02/13/2020  2:36 PM EDT ----- Refer to [facility], Dr Jenean Lindau for second opinion regarding his afib  Follow-up in the AF clinic in 2 weeks

## 2020-02-14 ENCOUNTER — Telehealth (HOSPITAL_COMMUNITY): Payer: Self-pay | Admitting: Nurse Practitioner

## 2020-02-14 NOTE — Telephone Encounter (Signed)
Called and left message for pt to call back.  Needs 2 wk f/u in [facility] per Dr. Johney Frame.

## 2020-02-15 NOTE — Telephone Encounter (Signed)
Called and spoke with patient.  He is aware of appt 02/28/20 with Rudi Coco, NP.

## 2020-02-19 NOTE — Addendum Note (Signed)
Addended by: Baird Lyons on: 02/19/2020 09:14 AM   Modules accepted: Orders

## 2020-02-28 ENCOUNTER — Ambulatory Visit (HOSPITAL_COMMUNITY): Payer: BC Managed Care – PPO | Admitting: Nurse Practitioner

## 2020-03-03 DIAGNOSIS — F408 Other phobic anxiety disorders: Secondary | ICD-10-CM | POA: Diagnosis not present

## 2020-03-03 DIAGNOSIS — K861 Other chronic pancreatitis: Secondary | ICD-10-CM | POA: Diagnosis not present

## 2020-03-03 DIAGNOSIS — I482 Chronic atrial fibrillation, unspecified: Secondary | ICD-10-CM | POA: Diagnosis not present

## 2020-03-03 DIAGNOSIS — I5032 Chronic diastolic (congestive) heart failure: Secondary | ICD-10-CM | POA: Diagnosis not present

## 2020-03-06 ENCOUNTER — Ambulatory Visit (HOSPITAL_COMMUNITY): Payer: BC Managed Care – PPO | Admitting: Nurse Practitioner

## 2020-03-06 NOTE — Telephone Encounter (Signed)
Pt wife called and canceled follow up appt for today - states will not reschedule at this time.

## 2020-07-03 DIAGNOSIS — Z0000 Encounter for general adult medical examination without abnormal findings: Secondary | ICD-10-CM | POA: Diagnosis not present

## 2020-07-03 DIAGNOSIS — Z6841 Body Mass Index (BMI) 40.0 and over, adult: Secondary | ICD-10-CM | POA: Diagnosis not present

## 2020-07-04 ENCOUNTER — Other Ambulatory Visit: Payer: Self-pay | Admitting: Internal Medicine

## 2020-07-17 ENCOUNTER — Other Ambulatory Visit: Payer: Self-pay | Admitting: Internal Medicine

## 2020-07-17 NOTE — Telephone Encounter (Signed)
Prescription refill request for Eliquis received.  Last office visit: Allred, 02/13/2020 Scr: 0.72, 11/09/2019 Age: 29 Weight: 131.5 kg  Prescription refill sent.

## 2020-08-30 HISTORY — PX: CHOLECYSTECTOMY: SHX55

## 2020-10-15 DIAGNOSIS — I480 Paroxysmal atrial fibrillation: Secondary | ICD-10-CM | POA: Diagnosis not present

## 2020-10-16 ENCOUNTER — Telehealth: Payer: Self-pay | Admitting: Internal Medicine

## 2020-10-16 NOTE — Telephone Encounter (Signed)
Received telephone call from Koleen Nimrod -wife states that patient was seen 10/15/2020 bu Dr. Tristan Schroeder at [facility]. States that patient's Flecainide was increased to 150mg . Wife states that Dr. is requesting patient to come in and have another EKG on Friday to determine if the increase of medication is working.  [phone].

## 2020-10-16 NOTE — Telephone Encounter (Signed)
Pt aware that since he is no longer following with Dr Johney Frame or Afib clinic here and that we didn't order EKG pt would need to have EKG done at Dr Piccini's office for f/u on Flecainide with recent med changes at that office

## 2020-10-30 DIAGNOSIS — I482 Chronic atrial fibrillation, unspecified: Secondary | ICD-10-CM | POA: Diagnosis not present

## 2020-10-30 DIAGNOSIS — Z6841 Body Mass Index (BMI) 40.0 and over, adult: Secondary | ICD-10-CM | POA: Diagnosis not present

## 2020-10-30 DIAGNOSIS — F408 Other phobic anxiety disorders: Secondary | ICD-10-CM | POA: Diagnosis not present

## 2020-10-30 DIAGNOSIS — I5032 Chronic diastolic (congestive) heart failure: Secondary | ICD-10-CM | POA: Diagnosis not present

## 2020-11-05 DIAGNOSIS — Z6841 Body Mass Index (BMI) 40.0 and over, adult: Secondary | ICD-10-CM | POA: Diagnosis not present

## 2020-11-05 DIAGNOSIS — I10 Essential (primary) hypertension: Secondary | ICD-10-CM | POA: Diagnosis not present

## 2020-11-05 DIAGNOSIS — K861 Other chronic pancreatitis: Secondary | ICD-10-CM | POA: Diagnosis not present

## 2020-11-05 DIAGNOSIS — F419 Anxiety disorder, unspecified: Secondary | ICD-10-CM | POA: Diagnosis not present

## 2020-11-05 DIAGNOSIS — K859 Acute pancreatitis without necrosis or infection, unspecified: Secondary | ICD-10-CM | POA: Diagnosis not present

## 2020-11-05 DIAGNOSIS — Z7901 Long term (current) use of anticoagulants: Secondary | ICD-10-CM | POA: Diagnosis not present

## 2020-11-05 DIAGNOSIS — E669 Obesity, unspecified: Secondary | ICD-10-CM | POA: Diagnosis not present

## 2020-11-05 DIAGNOSIS — R109 Unspecified abdominal pain: Secondary | ICD-10-CM | POA: Diagnosis not present

## 2020-11-05 DIAGNOSIS — I482 Chronic atrial fibrillation, unspecified: Secondary | ICD-10-CM | POA: Diagnosis not present

## 2020-11-05 DIAGNOSIS — I4891 Unspecified atrial fibrillation: Secondary | ICD-10-CM | POA: Diagnosis not present

## 2020-11-05 DIAGNOSIS — K8020 Calculus of gallbladder without cholecystitis without obstruction: Secondary | ICD-10-CM | POA: Diagnosis not present

## 2020-11-05 DIAGNOSIS — E876 Hypokalemia: Secondary | ICD-10-CM | POA: Diagnosis not present

## 2020-11-05 DIAGNOSIS — K858 Other acute pancreatitis without necrosis or infection: Secondary | ICD-10-CM | POA: Diagnosis not present

## 2020-11-05 DIAGNOSIS — K760 Fatty (change of) liver, not elsewhere classified: Secondary | ICD-10-CM | POA: Diagnosis not present

## 2020-11-05 DIAGNOSIS — Z20822 Contact with and (suspected) exposure to covid-19: Secondary | ICD-10-CM | POA: Diagnosis not present

## 2020-11-05 DIAGNOSIS — R1110 Vomiting, unspecified: Secondary | ICD-10-CM | POA: Diagnosis not present

## 2020-11-12 DIAGNOSIS — I10 Essential (primary) hypertension: Secondary | ICD-10-CM | POA: Diagnosis not present

## 2020-11-12 DIAGNOSIS — Z20822 Contact with and (suspected) exposure to covid-19: Secondary | ICD-10-CM | POA: Diagnosis not present

## 2020-11-12 DIAGNOSIS — Z7901 Long term (current) use of anticoagulants: Secondary | ICD-10-CM | POA: Diagnosis not present

## 2020-11-12 DIAGNOSIS — I429 Cardiomyopathy, unspecified: Secondary | ICD-10-CM | POA: Diagnosis not present

## 2020-11-12 DIAGNOSIS — K861 Other chronic pancreatitis: Secondary | ICD-10-CM | POA: Diagnosis not present

## 2020-11-12 DIAGNOSIS — K859 Acute pancreatitis without necrosis or infection, unspecified: Secondary | ICD-10-CM | POA: Diagnosis not present

## 2020-11-12 DIAGNOSIS — Z91040 Latex allergy status: Secondary | ICD-10-CM | POA: Diagnosis not present

## 2020-11-12 DIAGNOSIS — Q451 Annular pancreas: Secondary | ICD-10-CM | POA: Diagnosis not present

## 2020-11-12 DIAGNOSIS — K8020 Calculus of gallbladder without cholecystitis without obstruction: Secondary | ICD-10-CM | POA: Diagnosis not present

## 2020-11-12 DIAGNOSIS — I4892 Unspecified atrial flutter: Secondary | ICD-10-CM | POA: Diagnosis not present

## 2020-11-12 DIAGNOSIS — I4891 Unspecified atrial fibrillation: Secondary | ICD-10-CM | POA: Diagnosis not present

## 2020-11-12 DIAGNOSIS — K863 Pseudocyst of pancreas: Secondary | ICD-10-CM | POA: Diagnosis not present

## 2020-11-12 DIAGNOSIS — E669 Obesity, unspecified: Secondary | ICD-10-CM | POA: Diagnosis not present

## 2020-11-12 DIAGNOSIS — Z6841 Body Mass Index (BMI) 40.0 and over, adult: Secondary | ICD-10-CM | POA: Diagnosis not present

## 2020-11-12 DIAGNOSIS — F419 Anxiety disorder, unspecified: Secondary | ICD-10-CM | POA: Diagnosis not present

## 2020-11-12 DIAGNOSIS — I482 Chronic atrial fibrillation, unspecified: Secondary | ICD-10-CM | POA: Diagnosis not present

## 2020-11-12 IMAGING — CT CT HEART MORPH/PULM VEIN W/ CM & W/O CA SCORE
3 of 9 series · 9 of 20 positions shown, 10 images · IV contrast (APPLIED)
Comparison: None.
COMPARISON: None.

Addendum:
EXAM:
OVER-READ INTERPRETATION  CT CHEST

The following report is an over-read performed by radiologist Dr.
Weldon Bruns [REDACTED] on 11/05/2019. This
over-read does not include interpretation of cardiac or coronary
anatomy or pathology. The heart morphology pulmonary vein
interpretation by the cardiologist is attached.
CLINICAL DATA: Atrial fibrillation
Cardiac CTA
MEDICATIONS:
No additional meds
TECHNIQUE: The patient was scanned on a Siemens [REDACTED]ice scanner. Gantry
rotation speed was 250 msecs. Collimation was 0.6 mm. A 100 kV
prospective scan was triggered in the ascending thoracic aorta at
35-75% of the R-R interval. Average HR during the scan was 60 bpm.
The 3D data set was interpreted on a dedicated work station using
MPR, MIP and VRT modes. A total of 80cc of contrast was used.

[Series 7: ax thins · axial · 0.70mm/px · z∈[+1182,+1338]mm · 3 of 224 slices shown, 4 images]
[im 1/224  vessel]
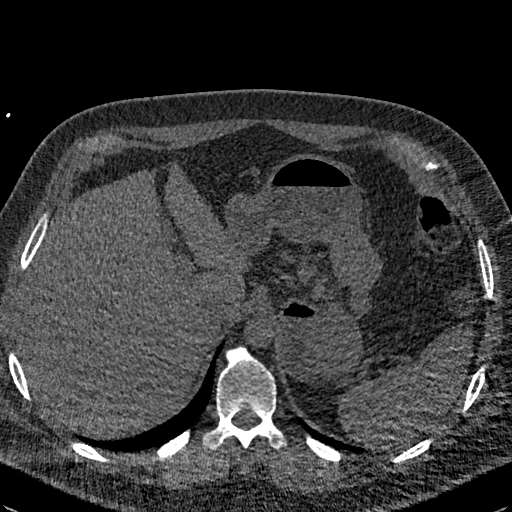
[im 1/224  lung]
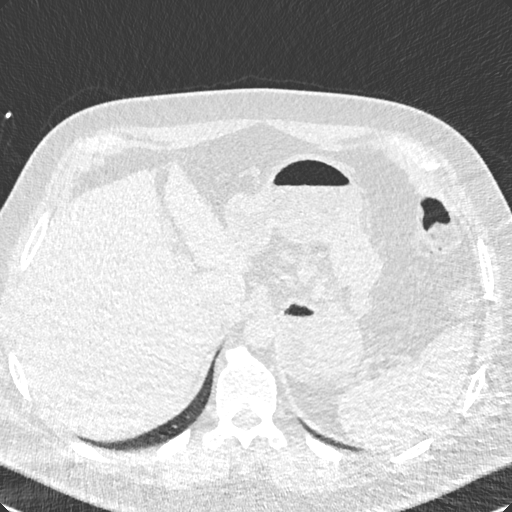
[im 112/224  vessel]
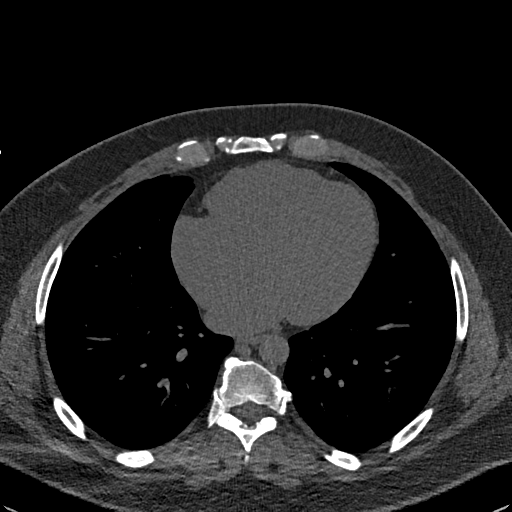
[im 224/224  vessel]
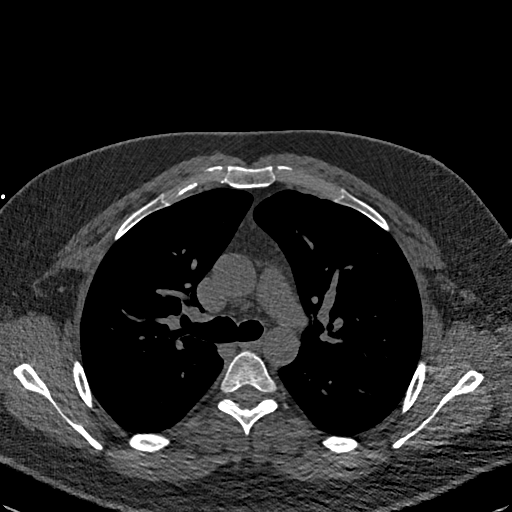

[Series 10: best diast · axial · 0.39mm/px · z∈[+1239,+1300]mm · 3 of 306 slices shown]
[im 77/306  vessel]
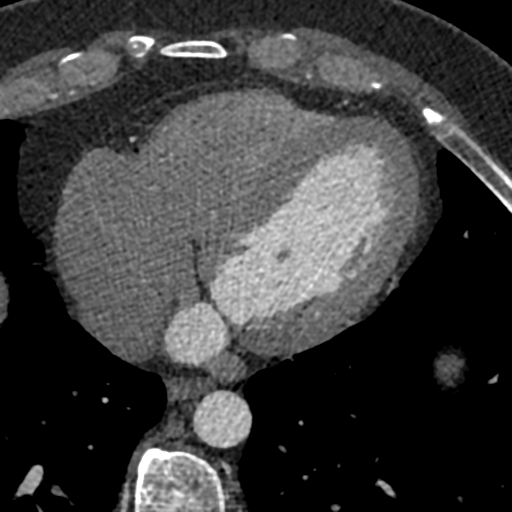
[im 153/306  vessel]
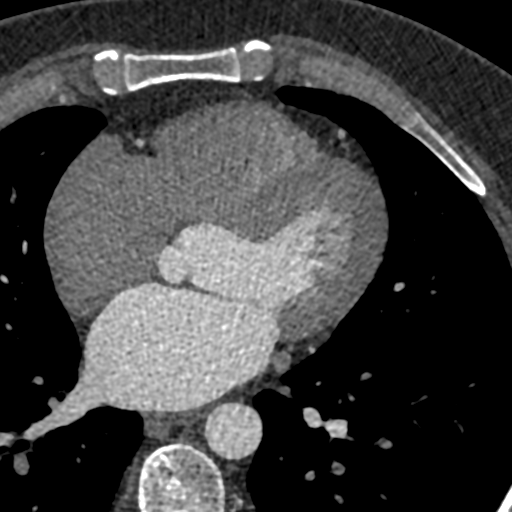
[im 229/306  vessel]
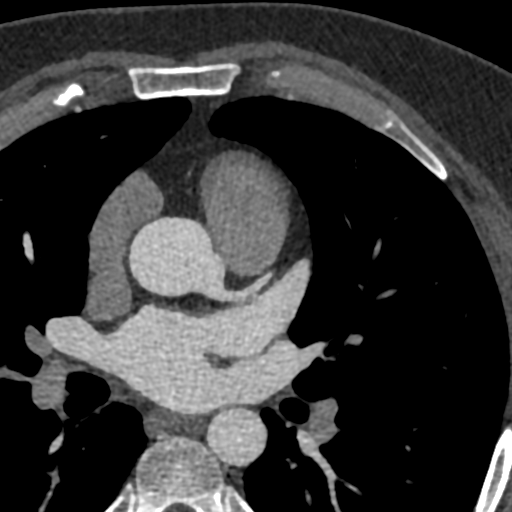

[Series 11: +300 ms · axial · 0.39mm/px · z∈[+1239,+1300]mm · 3 of 306 slices shown]
[im 77/306  vessel]
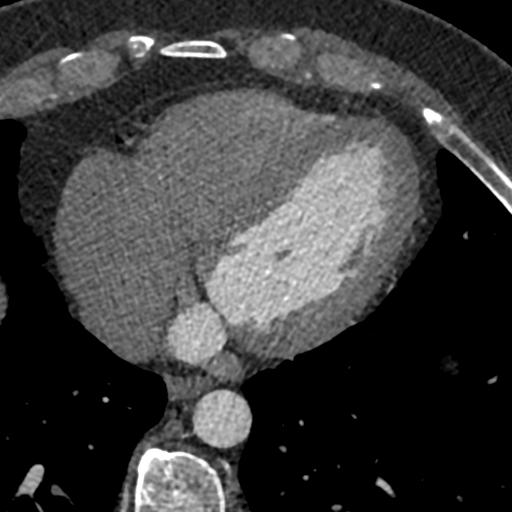
[im 153/306  vessel]
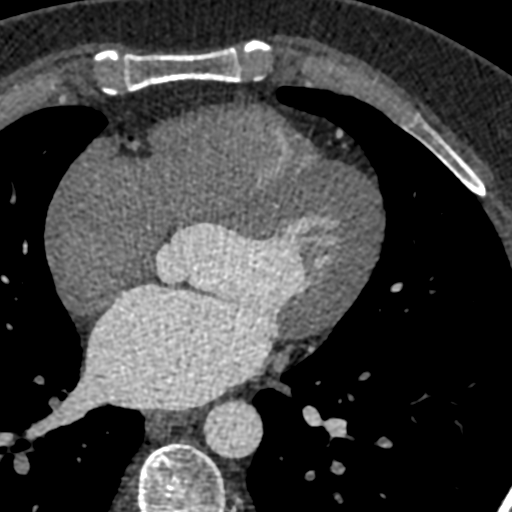
[im 229/306  vessel]
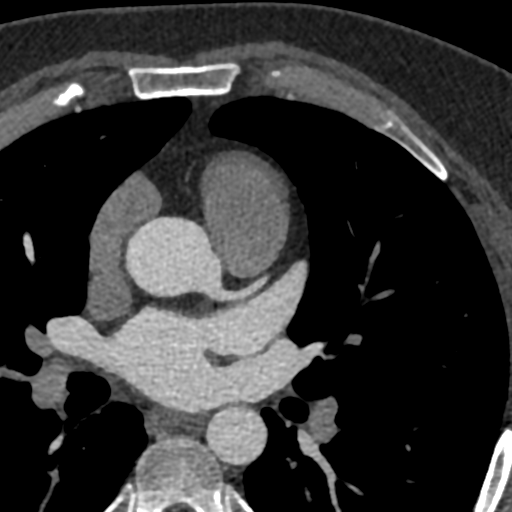

[9 of 20 positions shown; findings below may reference images not displayed]

FINDINGS: Limited view of the lung parenchyma demonstrates no suspicious
nodularity. Airways are normal.

Limited view of the mediastinum demonstrates no adenopathy.
Esophagus normal.

Limited view of the upper abdomen unremarkable.

Limited view of the skeleton and chest wall is unremarkable.
IMPRESSION: No significant extracardiac findings.
FINDINGS: Non-cardiac: See separate report from [REDACTED].

Mildlly dilated left atrium with no thrombus in the atrium or
appendage.

Pulmonary veins drain normally to the left atrium. The left-sided
pulmonary veins are in close proximity to the descending thoracic
aorta.

RUPV 15 x 15 mm

RMPV 12 x 11 mm

RLPV 17 x 14 mm

LUPV 17 x 14 mm

LLPV 16.5 x 15.5 mm

Calcium Score: 0 Agatston units.

Coronary Arteries: Right dominant with no anomalies

LM: No plaque or stenosis.

LAD system:  No plaque or stenosis.

Circumflex system: No plaque or stenosis.

RCA system: No plaque or stenosis.
IMPRESSION: 1. Coronary calcium score 0 Agatston units, suggesting low risk for
future cardiac events.

2.  No coronary disease noted.

3.  Pulmonary veins as described above.

4.  No thrombus in the left atrium or appendage.

Gourob Appon

*** End of Addendum ***
EXAM:
OVER-READ INTERPRETATION  CT CHEST

The following report is an over-read performed by radiologist Dr.
Weldon Bruns [REDACTED] on 11/05/2019. This
over-read does not include interpretation of cardiac or coronary
anatomy or pathology. The heart morphology pulmonary vein
interpretation by the cardiologist is attached.
FINDINGS: Limited view of the lung parenchyma demonstrates no suspicious
nodularity. Airways are normal.

Limited view of the mediastinum demonstrates no adenopathy.
Esophagus normal.

Limited view of the upper abdomen unremarkable.

Limited view of the skeleton and chest wall is unremarkable.
IMPRESSION: No significant extracardiac findings.

## 2020-11-20 DIAGNOSIS — Z6841 Body Mass Index (BMI) 40.0 and over, adult: Secondary | ICD-10-CM | POA: Diagnosis not present

## 2020-11-20 DIAGNOSIS — K861 Other chronic pancreatitis: Secondary | ICD-10-CM | POA: Diagnosis not present

## 2020-12-01 ENCOUNTER — Other Ambulatory Visit: Payer: Self-pay | Admitting: Internal Medicine

## 2020-12-04 DIAGNOSIS — Q453 Other congenital malformations of pancreas and pancreatic duct: Secondary | ICD-10-CM | POA: Diagnosis not present

## 2020-12-04 DIAGNOSIS — K859 Acute pancreatitis without necrosis or infection, unspecified: Secondary | ICD-10-CM | POA: Diagnosis not present

## 2020-12-04 DIAGNOSIS — Q451 Annular pancreas: Secondary | ICD-10-CM | POA: Diagnosis not present

## 2020-12-19 DIAGNOSIS — I5032 Chronic diastolic (congestive) heart failure: Secondary | ICD-10-CM | POA: Diagnosis not present

## 2020-12-19 DIAGNOSIS — I517 Cardiomegaly: Secondary | ICD-10-CM | POA: Diagnosis not present

## 2020-12-25 DIAGNOSIS — I509 Heart failure, unspecified: Secondary | ICD-10-CM | POA: Diagnosis not present

## 2020-12-25 DIAGNOSIS — I4891 Unspecified atrial fibrillation: Secondary | ICD-10-CM | POA: Diagnosis not present

## 2020-12-25 DIAGNOSIS — K859 Acute pancreatitis without necrosis or infection, unspecified: Secondary | ICD-10-CM | POA: Diagnosis not present

## 2021-01-22 DIAGNOSIS — Q451 Annular pancreas: Secondary | ICD-10-CM | POA: Diagnosis not present

## 2021-01-22 DIAGNOSIS — K859 Acute pancreatitis without necrosis or infection, unspecified: Secondary | ICD-10-CM | POA: Diagnosis not present

## 2021-01-27 ENCOUNTER — Other Ambulatory Visit: Payer: Self-pay | Admitting: Internal Medicine

## 2021-01-27 NOTE — Telephone Encounter (Signed)
Pt being followed by [facility].

## 2021-01-30 ENCOUNTER — Other Ambulatory Visit: Payer: Self-pay | Admitting: Internal Medicine

## 2021-01-30 NOTE — Telephone Encounter (Signed)
30m, 131.5kg, scr 0.81 11/14/20, lovw/allred 02/13/20

## 2021-02-04 DIAGNOSIS — I5032 Chronic diastolic (congestive) heart failure: Secondary | ICD-10-CM | POA: Diagnosis not present

## 2021-02-04 DIAGNOSIS — K8010 Calculus of gallbladder with chronic cholecystitis without obstruction: Secondary | ICD-10-CM | POA: Diagnosis not present

## 2021-02-04 DIAGNOSIS — Z6841 Body Mass Index (BMI) 40.0 and over, adult: Secondary | ICD-10-CM | POA: Diagnosis not present

## 2021-02-04 DIAGNOSIS — I10 Essential (primary) hypertension: Secondary | ICD-10-CM | POA: Diagnosis not present

## 2021-02-04 DIAGNOSIS — I4891 Unspecified atrial fibrillation: Secondary | ICD-10-CM | POA: Diagnosis not present

## 2021-02-04 DIAGNOSIS — K859 Acute pancreatitis without necrosis or infection, unspecified: Secondary | ICD-10-CM | POA: Diagnosis not present

## 2021-02-04 DIAGNOSIS — R040 Epistaxis: Secondary | ICD-10-CM | POA: Diagnosis not present

## 2021-02-04 DIAGNOSIS — E669 Obesity, unspecified: Secondary | ICD-10-CM | POA: Diagnosis not present

## 2021-02-04 DIAGNOSIS — Z79899 Other long term (current) drug therapy: Secondary | ICD-10-CM | POA: Diagnosis not present

## 2021-02-04 DIAGNOSIS — K8020 Calculus of gallbladder without cholecystitis without obstruction: Secondary | ICD-10-CM | POA: Diagnosis not present

## 2021-02-04 DIAGNOSIS — Z9889 Other specified postprocedural states: Secondary | ICD-10-CM | POA: Diagnosis not present

## 2021-02-04 DIAGNOSIS — F17290 Nicotine dependence, other tobacco product, uncomplicated: Secondary | ICD-10-CM | POA: Diagnosis not present

## 2021-02-04 DIAGNOSIS — Z7901 Long term (current) use of anticoagulants: Secondary | ICD-10-CM | POA: Diagnosis not present

## 2021-05-06 ENCOUNTER — Other Ambulatory Visit: Payer: Self-pay

## 2021-05-06 NOTE — Telephone Encounter (Addendum)
Prescription refill request for Eliquis received. Pt now seeing [facility] Cardiologist. Called pt and made him aware the Eliquis refill request needs to be sent to [facility]. Verbalized understanding.

## 2021-07-26 ENCOUNTER — Other Ambulatory Visit: Payer: Self-pay | Admitting: Internal Medicine

## 2021-07-27 NOTE — Telephone Encounter (Signed)
Prescription refill request for Eliquis received. Indication: afib  Last office visit: Allred 02/13/2020 Scr: 0.81, 11/14/2020 Age:  30 yo  Weight: 131.5 kg   Pt is overdue to see cardiologist. Message sent to scheduler to make an appointment.

## 2021-07-27 NOTE — Telephone Encounter (Signed)
Message to Starling Manns to schedule appt.

## 2021-08-07 ENCOUNTER — Encounter: Payer: Self-pay | Admitting: Internal Medicine

## 2021-08-07 ENCOUNTER — Ambulatory Visit (INDEPENDENT_AMBULATORY_CARE_PROVIDER_SITE_OTHER): Payer: BC Managed Care – PPO | Admitting: Internal Medicine

## 2021-08-07 VITALS — BP 124/100 | HR 72 | Ht 71.0 in | Wt 313.2 lb

## 2021-08-07 DIAGNOSIS — I10 Essential (primary) hypertension: Secondary | ICD-10-CM

## 2021-08-07 DIAGNOSIS — I4819 Other persistent atrial fibrillation: Secondary | ICD-10-CM | POA: Diagnosis not present

## 2021-08-07 DIAGNOSIS — D6869 Other thrombophilia: Secondary | ICD-10-CM

## 2021-08-07 MED ORDER — DILTIAZEM HCL ER COATED BEADS 240 MG PO CP24: 240.0000 mg | ORAL_CAPSULE | Freq: Every day | ORAL | 6 refills | Status: DC

## 2021-08-07 NOTE — Patient Instructions (Signed)
Medication Instructions:  Increase Diltiazem CD to 240mg  daily  Continue all other medications.     Labwork: none  Testing/Procedures: none  Follow-Up: 3 months - Afib Clinic   Any Other Special Instructions Will Be Listed Below (If Applicable).   If you need a refill on your cardiac medications before your next appointment, please call your pharmacy.

## 2021-08-07 NOTE — Progress Notes (Signed)
PCP: Toma Deiters, MD   Primary EP: Dr Johney Frame  Nathaniel [name] is a 30 y.o. male who presents today for routine electrophysiology followup.  Since last being seen in our clinic, the patient reports doing reasonably well.  He continues to have frequent afib with RVR.  He has not been compliant with office visits or lifestyle change.  During afib he has palpitations and fatigue.  The patient is otherwise without complaint today.   Past Medical History:  Diagnosis Date   Atrial fibrillation [facility])    Atrial flutter (HCC) 01/17/2016   Admx 5/17 with AFlutter with RVR in setting of pancreatitis // CHADS2-VASc=1 (CHF) // Anticoag not started // Amiodarone >> NSR    Cardiomyopathy (HCC)    CHF (congestive heart failure) (HCC)    Chronic lower back pain    Chronic systolic CHF (congestive heart failure) (HCC)    Echo 01/17/16 - EF 35-40%, diffuse HK, mild LAE    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HCC) 02/03/2016   a. EF 35-40% in setting of AFlutter with RVR in 5/17 // probably tachycardia mediated // needs FU echo 2-3 mos after NSR restored // b. Limited Echo 7/17: EF 45-50%, diff HK, mild LAE   Depression    "last 2-3 months; never treated for it" (12/09/2016)   Fatty liver 12/2015   per CT   Headache    "monthly" (12/09/2016)   Obesity    BMI 45 12/2015   Pancreatitis 2012   recurrent acute. [facility], 12/2015 admissin to [facility] hospital, ct then shows pseudocyst.    Pancreatitis 2012 X 2; 2017 X 2   Paroxysmal atrial fibrillation (HCC)    Typical atrial flutter (HCC) 06/06/2017   Past Surgical History:  Procedure Laterality Date   ATRIAL FIBRILLATION ABLATION N/A 12/09/2016   Procedure: Atrial Fibrillation Ablation;  Surgeon: Hillis Range, MD;  Location: [facility] INVASIVE CV LAB;  Service: Cardiovascular;  Laterality: N/A;   ATRIAL FIBRILLATION ABLATION N/A 11/09/2019   Procedure: ATRIAL FIBRILLATION ABLATION;  Surgeon: Hillis Range, MD;  Location: [facility] INVASIVE CV LAB;  Service:  Cardiovascular;  Laterality: N/A;   FRACTURE SURGERY     HAND SURGERY Right ~ 2008   "had pins in it; pulled them"   TEE WITHOUT CARDIOVERSION N/A 12/08/2016   Procedure: TRANSESOPHAGEAL ECHOCARDIOGRAM (TEE);  Surgeon: Jake Bathe, MD;  Location: [facility] ENDOSCOPY;  Service: Cardiovascular;  Laterality: N/A;   TONSILLECTOMY AND ADENOIDECTOMY     "in my teens"    ROS- all systems are reviewed and negatives except as per HPI above  Current Outpatient Medications  Medication Sig Dispense Refill   apixaban (ELIQUIS) 5 MG TABS tablet TAKE 1 TABLET BY MOUTH TWICE DAILY 60 tablet 0   clonazePAM (KLONOPIN) 0.5 MG tablet Take 0.5 mg by mouth 3 (three) times daily as needed for anxiety.     diltiazem (CARDIZEM CD) 120 MG 24 hr capsule Take 120 mg by mouth daily.     escitalopram (LEXAPRO) 10 MG tablet Take 10 mg by mouth daily.     flecainide (TAMBOCOR) 100 MG tablet TAKE 1 TABLET(100 MG) BY MOUTH TWICE DAILY (Patient taking differently: Take 150 mg by mouth 2 (two) times daily.) 180 tablet 1   Multiple Vitamins-Minerals (IMMUNE SUPPORT VITAMIN C PO) Take 1 tablet by mouth daily.      pantoprazole (PROTONIX) 40 MG tablet TAKE 1 TABLET(40 MG) BY MOUTH DAILY 90 tablet 3   triamcinolone cream (KENALOG) 0.1 % Apply 1 application topically daily as needed (  for irritation).      No current facility-administered medications for this visit.    Physical Exam: Vitals:   08/07/21 0850  Weight: (!) 313 lb 3.2 oz (142.1 kg)  Height: 5\' 11"  (1.803 m)    GEN- The patient is overweight but well appearing, alert and oriented x 3 today.   Head- normocephalic, atraumatic Eyes-  Sclera clear, conjunctiva pink Ears- hearing intact Oropharynx- clear Lungs- Clear to ausculation bilaterally, normal work of breathing Heart- irregular rate and rhythm, no murmurs, rubs or gallops, PMI not laterally displaced GI- soft, NT, ND, + BS Extremities- no clubbing, cyanosis, or edema  Wt Readings from Last 3 Encounters:   08/07/21 (!) 313 lb 3.2 oz (142.1 kg)  02/13/20 290 lb (131.5 kg)  11/28/19 288 lb 3.2 oz (130.7 kg)    EKG tracing ordered today is personally reviewed and shows afib with RVR  Assessment and Plan:  Paroxysmal atrial fibrillation Refractory despite PVI x 2 He was seen by Dr Janeece Riggers in February (note reviewed).  His flecainide was increased to 150mg  BID Hybrid ablation was offered.  The patient has not been compliant with follow-up to their office. I agree that a hybrid ablation is probably his best option.  We could consider tikosyn however I worry about compliance with this medicine and necessary follow-ups. Today, I have increased diltiazem to 240mg  daily.  I have advised follow-up with Dr Janeece Riggers for further discussions of a hybrid approach.  2. Morbid obesity Body mass index is 43.68 kg/m. Lifestyle modification advised He is not making progress  3. Previous tachycardia mediated CM Improved with sinus previously We will need to try to keep him in sinus long term  4. HTN Stable No change required today  Risks, benefits and potential toxicities for medications prescribed and/or refilled reviewed with patient today.   Follow-up with Dr Janeece Riggers as above Return to AF clinic in 3 months As I transition away from clinical practice, I would advise that he follow-up with Dr Quentin Ore from our group going forward.  Very complicated patient with refractory afib.  A high level of decision making was required for this encounter.  Thompson Grayer MD, [facility] 08/07/2021 9:00 AM

## 2021-08-25 ENCOUNTER — Other Ambulatory Visit: Payer: Self-pay | Admitting: Internal Medicine

## 2021-08-25 NOTE — Telephone Encounter (Signed)
Pt last saw Dr Johney Frame 08/07/21, last labs 11/14/20 Creat 0.81, age 30, weight 142.1kg, based on specified criteria pt is on appropriate dosage of Eliquis 5mg  BID for afib.  Will refill rx.

## 2021-11-05 ENCOUNTER — Ambulatory Visit (HOSPITAL_COMMUNITY)
Admission: RE | Admit: 2021-11-05 | Discharge: 2021-11-05 | Disposition: A | Discharge status: Home / Self Care | Payer: BC Managed Care – PPO | Source: Ambulatory Visit | Attending: Nurse Practitioner | Admitting: Nurse Practitioner

## 2021-11-05 ENCOUNTER — Encounter (HOSPITAL_COMMUNITY): Payer: Self-pay | Admitting: Nurse Practitioner

## 2021-11-05 ENCOUNTER — Other Ambulatory Visit: Payer: Self-pay

## 2021-11-05 VITALS — BP 116/76 | HR 76 | Ht 71.0 in | Wt 315.2 lb

## 2021-11-05 DIAGNOSIS — D6869 Other thrombophilia: Secondary | ICD-10-CM

## 2021-11-05 DIAGNOSIS — Z8616 Personal history of COVID-19: Secondary | ICD-10-CM | POA: Insufficient documentation

## 2021-11-05 DIAGNOSIS — Z7901 Long term (current) use of anticoagulants: Secondary | ICD-10-CM | POA: Insufficient documentation

## 2021-11-05 DIAGNOSIS — I4819 Other persistent atrial fibrillation: Secondary | ICD-10-CM | POA: Diagnosis not present

## 2021-11-05 DIAGNOSIS — I4891 Unspecified atrial fibrillation: Secondary | ICD-10-CM | POA: Diagnosis present

## 2021-11-05 MED ORDER — FLECAINIDE ACETATE 100 MG PO TABS: 150.0000 mg | ORAL_TABLET | Freq: Two times a day (BID) | ORAL | Status: DC

## 2021-11-05 MED ORDER — DILTIAZEM HCL 30 MG PO TABS: ORAL_TABLET | ORAL | 1 refills | Status: DC

## 2021-11-05 NOTE — Progress Notes (Signed)
Primary Care Physician: Neale Burly, MD Referring Physician: Dr. Nolberto Hanlon Nathaniel [name] is a 31 y.o. male with a h/o paroxysmal afib that is in the afib clinic for increased palpitations. He had a afib ablation in 2018. He has been maintained with flecainide and metoprolol. Is on eliquis 5 mg bid for CHA2DS2VASc score of 2. He has noted increased palpitations since he was hospitalized with acute on chronic  Pancreatitis 10/11/19. [name]  He had a bad episode at work the other night but today is in [place]. He also had covid last summer at which time he has increase of afib episodes.  He is interested in redo ablation if needed.   F/u in afib clinic, 11/28/19. He had redo  ablation 3/12 but sent a my chart message that he was having issues with intermittent afib and asked to  be seen. On arrival, he was in [name]. Pt states that he is scared to go to work as he gets so apprehensive when he goes into afib that he cant function.. His 30 mg Cardizem tab will usually convert him quickly. He has issues with anxiety and panic attacks. He mostly will feel anxiety and then the afib kicks in. He presented in [place] but while talking about his anxiety he went into afib by ausculation. No swallowing or groin issues since the procedure. We discussed today as well, that some afib after the procedure can be expected.   F/u in afib clinic, 11/05/21. He saw Dr. Rayann Heman 3 months ago. He states that he still has breakthrough afib. May last a couple of hours. He did get second opinion at [facility] and they discussed doing a Maze procedure and pt  was not ready. Dr. Rayann Heman also has mentioned convergent procedure in the past. Continues on flecainide 150 mg bid.  Eliquis 5 mg bid for a CHA2DS2VASc  score of 2.   Today, he denies symptoms of  shortness of breath, orthopnea, PND, lower extremity edema, dizziness, presyncope, syncope, or neurologic sequela. The patient is tolerating medications without difficulties and is otherwise without  complaint today.   Past Medical History:  Diagnosis Date   Atrial fibrillation [facility])    Atrial flutter ([place]) 01/17/2016   Admx 5/17 with AFlutter with RVR in setting of pancreatitis // CHADS2-VASc=1 (CHF) // Anticoag not started // Amiodarone >> NSR    Cardiomyopathy (HCC)    CHF (congestive heart failure) (HCC)    Chronic lower back pain    Chronic systolic CHF (congestive heart failure) ([place])    Echo 01/17/16 - EF 35-40%, diffuse HK, mild LAE    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[place]) 02/03/2016   a. EF 35-40% in setting of AFlutter with RVR in 5/17 // probably tachycardia mediated // needs FU echo 2-3 mos after NSR restored // b. Limited Echo 7/17: EF 45-50%, diff HK, mild LAE   Depression    "last 2-3 months; never treated for it" (12/09/2016)   Fatty liver 12/2015   per CT   Headache    "monthly" (12/09/2016)   Obesity    BMI 45 12/2015   Pancreatitis 2012   recurrent acute. Admissions to [facility], 12/2015 admissin to [place], ct then shows pseudocyst.    Pancreatitis 2012 X 2; 2017 X 2   Paroxysmal atrial fibrillation (HCC)    Typical atrial flutter ([place]) 06/06/2017   Past Surgical History:  Procedure Laterality Date   ATRIAL FIBRILLATION ABLATION N/A 12/09/2016   Procedure: Atrial Fibrillation Ablation;  Surgeon: Thompson Grayer,  MD;  Location: [place] CV LAB;  Service: Cardiovascular;  Laterality: N/A;   ATRIAL FIBRILLATION ABLATION N/A 11/09/2019   Procedure: ATRIAL FIBRILLATION ABLATION;  Surgeon: Thompson Grayer, MD;  Location: [place] CV LAB;  Service: Cardiovascular;  Laterality: N/A;   FRACTURE SURGERY     HAND SURGERY Right ~ 2008   "had pins in it; pulled them"   TEE WITHOUT CARDIOVERSION N/A 12/08/2016   Procedure: TRANSESOPHAGEAL ECHOCARDIOGRAM (TEE);  Surgeon: Jerline Pain, MD;  Location: [facility] ENDOSCOPY;  Service: Cardiovascular;  Laterality: N/A;   TONSILLECTOMY AND ADENOIDECTOMY     "in my teens"    Current Outpatient Medications  Medication Sig Dispense Refill    acetaminophen (TYLENOL) 325 MG tablet Take 650 mg by mouth as needed.     apixaban (ELIQUIS) 5 MG TABS tablet TAKE 1 TABLET BY MOUTH TWICE DAILY 60 tablet 5   clonazePAM (KLONOPIN) 0.5 MG tablet Take 0.5 mg by mouth 3 (three) times daily as needed for anxiety.     cyclobenzaprine (FLEXERIL) 10 MG tablet Take 10 mg by mouth as needed.     diltiazem (CARDIZEM CD) 240 MG 24 hr capsule Take 1 capsule (240 mg total) by mouth daily. 30 capsule 6   ibuprofen (ADVIL) 400 MG tablet Take 400 mg by mouth as needed.     levocetirizine (XYZAL) 5 MG tablet Take 5 mg by mouth as needed.     Multiple Vitamin (ONE-A-DAY MENS PO) Take 1 tablet by mouth every morning.     Omega-3 Fatty Acids (FISH OIL PO) Take 1 tablet by mouth every morning. 2,000 mg daily     pantoprazole (PROTONIX) 40 MG tablet TAKE 1 TABLET(40 MG) BY MOUTH DAILY 90 tablet 3   triamcinolone cream (KENALOG) 0.1 % Apply 1 application topically daily as needed (for irritation).      flecainide (TAMBOCOR) 100 MG tablet Take 1.5 tablets (150 mg total) by mouth 2 (two) times daily.     No current facility-administered medications for this encounter.    No Known Allergies  Social History   Socioeconomic History   Marital status: Married    Spouse name: Not on file   Number of children: 0   Years of education: 12   Highest education level: Not on file  Occupational History    Employer: [employer]  Tobacco Use   Smoking status: Former    Packs/day: 0.50    Types: Cigarettes   Smokeless tobacco: Current    Types: Snuff  Vaping Use   Vaping Use: Never used  Substance and Sexual Activity   Alcohol use: Not Currently    Comment: occasional   Drug use: Not Currently    Types: Marijuana    Comment: 12/09/2016 "daily from age 11 to 26"   Sexual activity: Yes    Birth control/protection: None  Other Topics Concern   Not on file  Social History Narrative   Pt lives in [place] with fiancee.  Works at [employer] in [place].    Social Determinants of Health   Financial Resource Strain: Not on file  Food Insecurity: Not on file  Transportation Needs: Not on file  Physical Activity: Not on file  Stress: Not on file  Social Connections: Not on file  Intimate Partner Violence: Not on file    Family History  Problem Relation Age of Onset   Diabetes Mother    Gallbladder disease Mother    Hypertension Father    Supraventricular tachycardia Brother    Cancer Maternal  Grandmother    Cancer Maternal Grandfather    Cirrhosis Maternal Grandfather    Stroke Paternal Grandmother    Celiac disease Neg Hx    Breast cancer Neg Hx    Cholelithiasis Neg Hx    Colon cancer Neg Hx    Colon polyps Neg Hx    Crohn's disease Neg Hx    Esophageal cancer Neg Hx    Hepatitis Neg Hx    Inflammatory bowel disease Neg Hx    Liver disease Neg Hx    Pancreatitis Neg Hx    Stomach cancer Neg Hx    Thyroid disease Neg Hx    Ulcerative colitis Neg Hx     ROS- All systems are reviewed and negative except as per the HPI above  Physical Exam: Vitals:   11/05/21 1531  BP: 116/76  Pulse: 76  Weight: (!) 143 kg  Height: 5\' 11"  (1.803 m)   Wt Readings from Last 3 Encounters:  11/05/21 (!) 143 kg  08/07/21 (!) 142.1 kg  02/13/20 131.5 kg    Labs: Lab Results  Component Value Date   NA 143 11/09/2019   K 3.2 (L) 11/09/2019   CL 105 11/09/2019   CO2 28 11/09/2019   GLUCOSE 120 (H) 11/09/2019   BUN 11 11/09/2019   CREATININE 0.72 11/09/2019   CALCIUM 9.0 11/09/2019   PHOS 3.8 05/09/2018   MG 2.1 05/09/2018   Lab Results  Component Value Date   INR 1.22 01/18/2016   Lab Results  Component Value Date   CHOL 116 05/11/2018   HDL 31 (L) 05/11/2018   LDLCALC 73 05/11/2018   TRIG 61 05/11/2018     GEN- The patient is well appearing, alert and oriented x 3 today.   Head- normocephalic, atraumatic Eyes-  Sclera clear, conjunctiva pink Ears- hearing intact Oropharynx- clear Neck- supple, no JVP Lymph- no  cervical lymphadenopathy Lungs- Clear to ausculation bilaterally, normal work of breathing Heart- Regular rate and rhythm, no murmurs, rubs or gallops, PMI not laterally displaced GI- soft, NT, ND, + BS Extremities- no clubbing, cyanosis, or edema MS- no significant deformity or atrophy Skin- no rash or lesion Psych- euthymic mood, full affect Neuro- strength and sensation are intact  EKG- Vent. rate 76 BPM PR interval 180 ms QRS duration 98 ms QT/QTcB 406/456 ms P-R-T axes 57 55 65 Normal sinus rhythm Normal ECG    Assessment and Plan: 1.Afib S/p ablation in 2018 and redo 2021 Having some afib that can be driven/ exacerbated by anxiety/panic attacks  Maze and convergent p[procedure have been discussed with pt from  MD at [facility] and Dr. Rayann Heman He has chosen to avoid procedures  for now if afib remains tolerable  30 mg cardizem RX given and discussed how to take for breakthrough afib   Continue  eliquis 5 mg bid for CHA2DS2VASc of 2   He would like to establish with Dr. Quentin Ore for  6 month f/u   Butch Penny C. Kaide Gage, [facility] [address] [place], [place] [address] [phone]

## 2022-01-05 ENCOUNTER — Other Ambulatory Visit (HOSPITAL_COMMUNITY): Payer: Self-pay | Admitting: *Deleted

## 2022-01-05 MED ORDER — FLECAINIDE ACETATE 150 MG PO TABS
150.0000 mg | ORAL_TABLET | Freq: Two times a day (BID) | ORAL | 6 refills | Status: DC
Start: 2022-01-05 — End: 2022-07-06

## 2022-01-05 MED ORDER — DILTIAZEM HCL 30 MG PO TABS
ORAL_TABLET | ORAL | 1 refills | Status: DC
Start: 2022-01-05 — End: 2022-06-01

## 2022-02-21 ENCOUNTER — Other Ambulatory Visit: Payer: Self-pay | Admitting: Internal Medicine

## 2022-02-21 DIAGNOSIS — I480 Paroxysmal atrial fibrillation: Secondary | ICD-10-CM

## 2022-02-22 NOTE — Telephone Encounter (Signed)
Prescription refill request for Eliquis received. Indication: Afib  Last office visit: 11/05/21 Noralyn Pick)  Scr: 0.76 (07/21/21)  Age: 31 Weight: 143kg  Appropriate dose and refill sent to requested pharmacy.

## 2022-05-21 ENCOUNTER — Encounter: Payer: Self-pay | Admitting: Cardiology

## 2022-05-21 ENCOUNTER — Ambulatory Visit: Payer: 59 | Attending: Cardiology | Admitting: Cardiology

## 2022-05-21 VITALS — BP 118/78 | HR 80 | Ht 71.0 in | Wt 321.0 lb

## 2022-05-21 DIAGNOSIS — I10 Essential (primary) hypertension: Secondary | ICD-10-CM

## 2022-05-21 DIAGNOSIS — I4819 Other persistent atrial fibrillation: Secondary | ICD-10-CM | POA: Diagnosis not present

## 2022-05-21 NOTE — Patient Instructions (Signed)
Medication Instructions:  none *If you need a refill on your cardiac medications before your next appointment, please call your pharmacy*   Lab Work: none If you have labs (blood work) drawn today and your tests are completely normal, you will receive your results only by: [place] (if you have MyChart) OR A paper copy in the mail If you have any lab test that is abnormal or we need to change your treatment, we will call you to review the results.   Testing/Procedures: none   Follow-Up: At [facility], you and your health needs are our priority.  As part of our continuing mission to provide you with exceptional heart care, we have created designated Provider Care Teams.  These Care Teams include your primary Cardiologist (physician) and Advanced Practice Providers (APPs -  Physician Assistants and Nurse Practitioners) who all work together to provide you with the care you need, when you need it.  We recommend signing up for the patient portal called "MyChart".  Sign up information is provided on this After Visit Summary.  MyChart is used to connect with patients for Virtual Visits (Telemedicine).  Patients are able to view lab/test results, encounter notes, upcoming appointments, etc.  Non-urgent messages can be sent to your provider as well.   To learn more about what you can do with MyChart, go to [email or web address].    Your next appointment:   Erline Levine in the Afib clinic will call you for the [facility] admission.   Other Instructions Tikosyn (Dofetilide) Hospital Admission   Prior to day of admission:  Check with drug insurance company for cost of drug to ensure affordability --- Dofetilide 500 mcg twice a day.  GoodRx is an option if insurance copay is unaffordable.   All patients are tested for COVID-19 prior to admission.   No Benadryl is allowed 3 days prior to admission.   Please ensure no missed doses of your anticoagulation (blood thinner) for 3 weeks  prior to admission. If a dose is missed please notify our office immediately.   A pharmacist will review all your medications for potential interactions with Tikosyn. If any medication changes are needed prior to admission we will be in touch with you.   If any new medications are started AFTER your admission date is set with Nurse, adult. Please notify our office immediately so your medication list can be updated and reviewed by our pharmacist again.  On day of admission:  Tikosyn initiation requires a 3 night/4 day hospital stay with constant telemetry monitoring. You will have an EKG after each dose of Tikosyn as well as daily lab draws.   If the drug does not convert you to normal rhythm a cardioversion after the 4th dose of Tikosyn.   Afib Clinic office visit on the morning of admission is needed for preliminary labs/ekg.   Time of admission is dependent on bed availability in the hospital. In some instances, you will be sent home until bed is available. Rarely admission can be delayed to the following day if hospital census prevents available beds.   You may bring personal belongings/clothing with you to the hospital. Please leave your suitcase in the car until you arrive in admissions.   Questions please call our office at [place]

## 2022-05-21 NOTE — Progress Notes (Deleted)
Electrophysiology Office Follow up Visit Note:    Date:  05/21/2022   ID:  Nathaniel [name], DOB 1990/10/30, MRN [id number]  PCP:  Neale Burly, MD  [facility] Cardiologist:  None  [facility] Electrophysiologist:  Thompson Grayer, MD    Interval History:    Nathaniel [name] is a 31 y.o. male who presents for a follow up visit. They were last seen in clinic November 05, 2021 by Roderic Palau.  The patient was previously seen by Dr. Rayann Heman.  The patient had a prior A-fib ablation in 2018 with a redo ablation in 2021.  He has been maintained on flecainide and metoprolol since then.  He takes Eliquis for stroke prophylaxis.         Past Medical History:  Diagnosis Date   Atrial fibrillation [facility])    Atrial flutter ([place]) 01/17/2016   Admx 5/17 with AFlutter with RVR in setting of pancreatitis // CHADS2-VASc=1 (CHF) // Anticoag not started // Amiodarone >> NSR    Cardiomyopathy (HCC)    CHF (congestive heart failure) (HCC)    Chronic lower back pain    Chronic systolic CHF (congestive heart failure) ([place])    Echo 01/17/16 - EF 35-40%, diffuse HK, mild LAE    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[place]) 02/03/2016   a. EF 35-40% in setting of AFlutter with RVR in 5/17 // probably tachycardia mediated // needs FU echo 2-3 mos after NSR restored // b. Limited Echo 7/17: EF 45-50%, diff HK, mild LAE   Depression    "last 2-3 months; never treated for it" (12/09/2016)   Fatty liver 12/2015   per CT   Headache    "monthly" (12/09/2016)   Obesity    BMI 45 12/2015   Pancreatitis 2012   recurrent acute. Admissions to [facility], 12/2015 admissin to [facility] hospital, ct then shows pseudocyst.    Pancreatitis 2012 X 2; 2017 X 2   Paroxysmal atrial fibrillation (HCC)    Typical atrial flutter ([place]) 06/06/2017    Past Surgical History:  Procedure Laterality Date   ATRIAL FIBRILLATION ABLATION N/A 12/09/2016   Procedure: Atrial Fibrillation Ablation;  Surgeon: Thompson Grayer, MD;  Location: [place] CV LAB;   Service: Cardiovascular;  Laterality: N/A;   ATRIAL FIBRILLATION ABLATION N/A 11/09/2019   Procedure: ATRIAL FIBRILLATION ABLATION;  Surgeon: Thompson Grayer, MD;  Location: [place] CV LAB;  Service: Cardiovascular;  Laterality: N/A;   FRACTURE SURGERY     HAND SURGERY Right ~ 2008   "had pins in it; pulled them"   TEE WITHOUT CARDIOVERSION N/A 12/08/2016   Procedure: TRANSESOPHAGEAL ECHOCARDIOGRAM (TEE);  Surgeon: Jerline Pain, MD;  Location: [facility] ENDOSCOPY;  Service: Cardiovascular;  Laterality: N/A;   TONSILLECTOMY AND ADENOIDECTOMY     "in my teens"    Current Medications: No outpatient medications have been marked as taking for the 05/21/22 encounter (Appointment) with Vickie Epley, MD.     Allergies:   Patient has no known allergies.   Social History   Socioeconomic History   Marital status: Married    Spouse name: Not on file   Number of children: 0   Years of education: 12   Highest education level: Not on file  Occupational History    Employer: [employer]  Tobacco Use   Smoking status: Former    Packs/day: 0.50    Types: Cigarettes   Smokeless tobacco: Current    Types: Snuff  Vaping Use   Vaping Use: Never used  Substance and Sexual Activity  Alcohol use: Not Currently    Comment: occasional   Drug use: Not Currently    Types: Marijuana    Comment: 12/09/2016 "daily from age 78 to 71"   Sexual activity: Yes    Birth control/protection: None  Other Topics Concern   Not on file  Social History Narrative   Pt lives in [place] with fiancee.  Works at [employer] in [place].   Social Determinants of Health   Financial Resource Strain: Not on file  Food Insecurity: Not on file  Transportation Needs: Not on file  Physical Activity: Not on file  Stress: Not on file  Social Connections: Not on file     Family History: The patient's family history includes Cancer in his maternal grandfather and maternal grandmother; Cirrhosis in his maternal  grandfather; Diabetes in his mother; Gallbladder disease in his mother; Hypertension in his father; Stroke in his paternal grandmother; Supraventricular tachycardia in his brother. There is no history of Celiac disease, Breast cancer, Cholelithiasis, Colon cancer, Colon polyps, Crohn's disease, Esophageal cancer, Hepatitis, Inflammatory bowel disease, Liver disease, Pancreatitis, Stomach cancer, Thyroid disease, or Ulcerative colitis.  ROS:   Please see the history of present illness.    All other systems reviewed and are negative.  EKGs/Labs/Other Studies Reviewed:    The following studies were reviewed today:    EKG:  The ekg ordered today demonstrates ***  Recent Labs: No results found for requested labs within last 365 days.  Recent Lipid Panel    Component Value Date/Time   CHOL 116 05/11/2018 0457   TRIG 61 05/11/2018 0457   HDL 31 (L) 05/11/2018 0457   CHOLHDL 3.7 05/11/2018 0457   VLDL 12 05/11/2018 0457   LDLCALC 73 05/11/2018 0457    Physical Exam:    VS:  There were no vitals taken for this visit.    Wt Readings from Last 3 Encounters:  11/05/21 (!) 315 lb 3.2 oz (143 kg)  08/07/21 (!) 313 lb 3.2 oz (142.1 kg)  02/13/20 290 lb (131.5 kg)     GEN: *** Well nourished, well developed in no acute distress HEENT: Normal NECK: No JVD; No carotid bruits LYMPHATICS: No lymphadenopathy CARDIAC: ***RRR, no murmurs, rubs, gallops RESPIRATORY:  Clear to auscultation without rales, wheezing or rhonchi  ABDOMEN: Soft, non-tender, non-distended MUSCULOSKELETAL:  No edema; No deformity  SKIN: Warm and dry NEUROLOGIC:  Alert and oriented x 3 PSYCHIATRIC:  Normal affect        ASSESSMENT:    No diagnosis found. PLAN:    In order of problems listed above:   #Persistent atrial fibrillation On Eliquis for stroke prophylaxis Has had 2 prior ablations in 2018 and 2021 On flecainide and diltiazem  #Obesity  #Hypertension *** goal today.  Recommend checking  blood pressures 1-2 times per week at home and recording the values.  Recommend bringing these recordings to the primary care physician.         Total time spent with patient today *** minutes. This includes reviewing records, evaluating the patient and coordinating care.   Medication Adjustments/Labs and Tests Ordered: Current medicines are reviewed at length with the patient today.  Concerns regarding medicines are outlined above.  No orders of the defined types were placed in this encounter.  No orders of the defined types were placed in this encounter.    Signed, Steffanie Dunn, MD, [facility], [facility] 05/21/2022 7:49 AM    Electrophysiology [facility]

## 2022-05-21 NOTE — Progress Notes (Signed)
Electrophysiology Office Follow up Visit Note:    Date:  05/21/2022   ID:  Nathaniel [name], DOB 01/12/1991, MRN [id number]  PCP:  Toma Deiters, [name]  [facility] Cardiologist:  None  [facility] Electrophysiologist:  Lanier Prude, [name]    Interval History:    Nathaniel [name] is a 31 y.o. male who presents for a follow up visit. They were last seen in clinic November 05, 2021 by Nathaniel [name].  The patient was previously seen by Dr. Johney Frame.  The patient had a prior A-fib ablation in 2018 with a redo ablation in 2021.  He has been maintained on flecainide and metoprolol since then.  He takes Eliquis for stroke prophylaxis.  Today, he is accompanied by his wife. He reports having more frequent episodes of Afib during which he is becoming more symptomatic. While in Afib, normally he has to lie down for 10 minutes up to 2 hours until his symptoms resolve. He also may have associated chest pains and shortness of breath. He suffers from anxiety and panic attacks, significantly exacerbated by atrial fibrillation. He has been out of work since 10/2019 due to his symptomatic arrhythmic episodes.  He confirms currently taking flecainide. Initially he was on Eliquis, metoprolol, and lisinopril. He has not tried Therapist, occupational. Right after his diagnosis in 2017 he was initially on amiodarone short term.  This year he had been trying to increase his exercise. He was doing well until he developed a recent COVID infection.  He denies any peripheral edema. No lightheadedness, headaches, syncope, orthopnea, or PND.      Past Medical History:  Diagnosis Date   Atrial fibrillation [facility])    Atrial flutter (HCC) 01/17/2016   Admx 5/17 with AFlutter with RVR in setting of pancreatitis // CHADS2-VASc=1 (CHF) // Anticoag not started // Amiodarone >> NSR    Cardiomyopathy (HCC)    CHF (congestive heart failure) (HCC)    Chronic lower back pain    Chronic systolic CHF (congestive heart failure) (HCC)    Echo  01/17/16 - EF 35-40%, diffuse HK, mild LAE    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HCC) 02/03/2016   a. EF 35-40% in setting of AFlutter with RVR in 5/17 // probably tachycardia mediated // needs FU echo 2-3 mos after NSR restored // b. Limited Echo 7/17: EF 45-50%, diff HK, mild LAE   Depression    "last 2-3 months; never treated for it" (12/09/2016)   Fatty liver 12/2015   per CT   Headache    "monthly" (12/09/2016)   Obesity    BMI 45 12/2015   Pancreatitis 2012   recurrent acute. Admissions to [facility], 12/2015 admissin to [facility] hospital, ct then shows pseudocyst.    Pancreatitis 2012 X 2; 2017 X 2   Paroxysmal atrial fibrillation (HCC)    Typical atrial flutter (HCC) 06/06/2017    Past Surgical History:  Procedure Laterality Date   ATRIAL FIBRILLATION ABLATION N/A 12/09/2016   Procedure: Atrial Fibrillation Ablation;  Surgeon: Nathaniel [name];  Location: [facility] INVASIVE CV LAB;  Service: Cardiovascular;  Laterality: N/A;   ATRIAL FIBRILLATION ABLATION N/A 11/09/2019   Procedure: ATRIAL FIBRILLATION ABLATION;  Surgeon: Nathaniel [name];  Location: [facility] INVASIVE CV LAB;  Service: Cardiovascular;  Laterality: N/A;   FRACTURE SURGERY     HAND SURGERY Right ~ 2008   "had pins in it; pulled them"   TEE WITHOUT CARDIOVERSION N/A 12/08/2016   Procedure: TRANSESOPHAGEAL ECHOCARDIOGRAM (TEE);  Surgeon: Nathaniel [name];  Location: [facility] ENDOSCOPY;  Service: Cardiovascular;  Laterality: N/A;   TONSILLECTOMY AND ADENOIDECTOMY     "in my teens"    Current Medications: Current Meds  Medication Sig   acetaminophen (TYLENOL) 325 MG tablet Take 650 mg by mouth as needed.   clonazePAM (KLONOPIN) 0.5 MG tablet Take 0.5 mg by mouth 3 (three) times daily as needed for anxiety.   cyanocobalamin (VITAMIN B12) 500 MCG tablet Take 500 mcg by mouth daily.   diltiazem (CARDIZEM CD) 240 MG 24 hr capsule TAKE ONE CAPSULE BY MOUTH EVERY DAY   diltiazem (CARDIZEM) 30 MG tablet Take 1 tablet every 4 hours AS NEEDED for heart rate  >100   ELIQUIS 5 MG TABS tablet TAKE 1 TABLET BY MOUTH TWICE DAILY   flecainide (TAMBOCOR) 150 MG tablet Take 1 tablet (150 mg total) by mouth 2 (two) times daily.   Multiple Vitamin (ONE-A-DAY MENS PO) Take 1 tablet by mouth every morning.   Omega-3 Fatty Acids (FISH OIL PO) Take 1 tablet by mouth every morning. 2,000 mg daily   pantoprazole (PROTONIX) 40 MG tablet TAKE 1 TABLET(40 MG) BY MOUTH DAILY   triamcinolone cream (KENALOG) 0.1 % Apply 1 application topically daily as needed (for irritation).      Allergies:   Morphine   Social History   Socioeconomic History   Marital status: Married    Spouse name: Not on file   Number of children: 0   Years of education: 12   Highest education level: Not on file  Occupational History    Employer: [employer]  Tobacco Use   Smoking status: Former    Packs/day: 0.50    Types: Cigarettes   Smokeless tobacco: Current    Types: Snuff  Vaping Use   Vaping Use: Never used  Substance and Sexual Activity   Alcohol use: Not Currently    Comment: occasional   Drug use: Not Currently    Types: Marijuana    Comment: 12/09/2016 "daily from age 38 to 43"   Sexual activity: Yes    Birth control/protection: None  Other Topics Concern   Not on file  Social History Narrative   Pt lives in [place] with fiancee.  Works at [employer] in [place].   Social Determinants of Health   Financial Resource Strain: Not on file  Food Insecurity: Not on file  Transportation Needs: Not on file  Physical Activity: Not on file  Stress: Not on file  Social Connections: Not on file     Family History: The patient's family history includes Cancer in his maternal grandfather and maternal grandmother; Cirrhosis in his maternal grandfather; Diabetes in his mother; Gallbladder disease in his mother; Hypertension in his father; Stroke in his paternal grandmother; Supraventricular tachycardia in his brother. There is no history of Celiac disease, Breast  cancer, Cholelithiasis, Colon cancer, Colon polyps, Crohn's disease, Esophageal cancer, Hepatitis, Inflammatory bowel disease, Liver disease, Pancreatitis, Stomach cancer, Thyroid disease, or Ulcerative colitis.  ROS:   Please see the history of present illness.    (+) Anxiety, panic attacks (+) Chest pains (+) Shortness of breath All other systems reviewed and are negative.  EKGs/Labs/Other Studies Reviewed:    The following studies were reviewed today:  12/19/2020  Echo  ([place]): Summary    1. The left ventricle is normal in size with normal wall thickness.    2. The left ventricular systolic function is normal, LVEF is visually  estimated at 50-55%.    3. The left atrium is mildly dilated in size.  4. The right ventricle is normal in size, with normal systolic function.    5. The right atrium is mildly dilated  in size.    EKG:  EKG is personally reviewed.  05/21/2022:  sinus rhythm. PR . QRS dur 26ms.   Recent Labs: No results found for requested labs within last 365 days.   Recent Lipid Panel    Component Value Date/Time   CHOL 116 05/11/2018 0457   TRIG 61 05/11/2018 0457   HDL 31 (L) 05/11/2018 0457   CHOLHDL 3.7 05/11/2018 0457   VLDL 12 05/11/2018 0457   LDLCALC 73 05/11/2018 0457    Physical Exam:    VS:  BP 118/78   Pulse 80   Ht 5\' 11"  (1.803 m)   Wt (!) 321 lb (145.6 kg)   SpO2 98%   BMI 44.77 kg/m     Wt Readings from Last 3 Encounters:  05/21/22 (!) 321 lb (145.6 kg)  11/05/21 (!) 315 lb 3.2 oz (143 kg)  08/07/21 (!) 313 lb 3.2 oz (142.1 kg)     GEN: Well nourished, well developed in no acute distress. Morbidly obese. HEENT: Normal NECK: No JVD; No carotid bruits LYMPHATICS: No lymphadenopathy CARDIAC: RRR, no murmurs, rubs, gallops RESPIRATORY:  Clear to auscultation without rales, wheezing or rhonchi  ABDOMEN: Soft, non-tender, non-distended MUSCULOSKELETAL:  No edema; No deformity  SKIN: Warm and dry NEUROLOGIC:  Alert  and oriented x 3 PSYCHIATRIC:  Normal affect        ASSESSMENT:    1. Persistent atrial fibrillation (HCC)   2. Morbid obesity (HCC)   3. Primary hypertension    PLAN:    In order of problems listed above:  #Persistent atrial fibrillation On Eliquis for stroke prophylaxis Has had 2 prior ablations in 2018 and 2021 On flecainide and diltiazem currently but given symptomatic breakthrough of AF, I have recommended dofetilide loading. I Have discussed this process in detail including the risks and need for inpatient hospitalization and he wishes to proceed. He will need to stop the flecainide 3 days prior to starting dofetilide.   #Obesity   #Hypertension At goal today.  Recommend checking blood pressures 1-2 times per week at home and recording the values.  Recommend bringing these recordings to the primary care physician.    Medication Adjustments/Labs and Tests Ordered: Current medicines are reviewed at length with the patient today.  Concerns regarding medicines are outlined above.   Orders Placed This Encounter  Procedures   EKG 12-Lead   No orders of the defined types were placed in this encounter.   I,Mathew Stumpf,acting as a 2022 for Neurosurgeon, [name].,have documented all relevant documentation on the behalf of Lanier Prude, [name],as directed by  Lanier Prude, [name] while in the presence of Lanier Prude, [name].  I, Lanier Prude, [name], have reviewed all documentation for this visit. The documentation on 05/21/22 for the exam, diagnosis, procedures, and orders are all accurate and complete.   Signed, 05/23/22, [name], [facility], [facility] 05/21/2022 9:45 PM    Electrophysiology [facility]

## 2022-05-26 ENCOUNTER — Telehealth: Payer: Self-pay | Admitting: Pharmacist

## 2022-05-26 NOTE — Telephone Encounter (Signed)
Medication list reviewed in anticipation of upcoming Tikosyn initiation. Patient is not taking any contraindicated or QTc prolonging medications.   Patient will need to stop flecainide at least 4 days prior to starting Tikosyn.  Patient is anticoagulated on Eliquis on the appropriate dose. Please ensure that patient has not missed any anticoagulation doses in the 3 weeks prior to Tikosyn initiation.   Patient will need to be counseled to avoid use of Benadryl while on Tikosyn and in the 2-3 days prior to Tikosyn initiation.

## 2022-05-31 NOTE — Telephone Encounter (Signed)
Pt verbalized understanding to stop flecainide 4 days prior to admission

## 2022-06-01 ENCOUNTER — Other Ambulatory Visit (HOSPITAL_COMMUNITY): Payer: Self-pay | Admitting: *Deleted

## 2022-06-01 MED ORDER — DILTIAZEM HCL 30 MG PO TABS
ORAL_TABLET | ORAL | 1 refills | Status: DC
Start: 2022-06-01 — End: 2022-10-21

## 2022-06-07 ENCOUNTER — Telehealth (HOSPITAL_COMMUNITY): Payer: Self-pay | Admitting: *Deleted

## 2022-06-07 NOTE — Telephone Encounter (Signed)
Patient approved for [place] admission 10/30 auth number [id number]

## 2022-06-22 ENCOUNTER — Ambulatory Visit (INDEPENDENT_AMBULATORY_CARE_PROVIDER_SITE_OTHER): Payer: 59 | Admitting: Psychiatry

## 2022-06-22 ENCOUNTER — Encounter (HOSPITAL_COMMUNITY): Payer: Self-pay | Admitting: Psychiatry

## 2022-06-22 DIAGNOSIS — F410 Panic disorder [episodic paroxysmal anxiety] without agoraphobia: Secondary | ICD-10-CM

## 2022-06-22 DIAGNOSIS — G4726 Circadian rhythm sleep disorder, shift work type: Secondary | ICD-10-CM

## 2022-06-22 DIAGNOSIS — Z79899 Other long term (current) drug therapy: Secondary | ICD-10-CM

## 2022-06-22 DIAGNOSIS — I480 Paroxysmal atrial fibrillation: Secondary | ICD-10-CM

## 2022-06-22 DIAGNOSIS — F411 Generalized anxiety disorder: Secondary | ICD-10-CM | POA: Diagnosis not present

## 2022-06-22 MED ORDER — CLONAZEPAM 0.5 MG PO TABS: 0.5000 mg | ORAL_TABLET | Freq: Two times a day (BID) | ORAL | 0 refills | Status: DC

## 2022-06-22 MED ORDER — SERTRALINE HCL 25 MG PO TABS: ORAL_TABLET | ORAL | 0 refills | Status: DC

## 2022-06-22 NOTE — Progress Notes (Signed)
Psychiatric Initial Adult Assessment  Patient Identification: Nathaniel [name] MRN:  [id number] Date of Evaluation:  06/22/2022 Referral Source: PCP  Assessment:  Nathaniel [name] is a 31 y.o. 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  Patient reports worsening anxiety since developing afib and heart failure in 2017-2018. His anxiety got to the point of not being able to leave the house or go into public without having to leave for fears associated with being in crowds. There is some overlap between his cardiac symptoms and description of panic. Unfortunately previous trial of lexapro was cut short due to cardiac sensation increase. Additionally, has become attached to clonazepam and has insight into fears of cutting back as related to prior initiation of lexapro. With potential changes to his cardiac regimen, zoloft chosen as next trial due to overall favorable drug-drug interaction profile. Will start at low dose and plan to proceed cautiously. Will also begin slow taper of clonazepam as outlined in plan below. Unclear how heavy his past drinking may have been to receive alcohol abuse diagnosis but repeated pancreatitis and cardiomyopathy would suggest prior heavy use; another reason to not use benzodiazepines. He is precontemplative with regard to tobacco cessation but would likely improve his anxiety. Insomnia best characterized as shift work based due to previously working night shift for many years. CBT referral has been placed. Follow up in 2 weeks.  Plan:  # GAD with panic attacks  Long term current use of  benzodiazepines Past medication trials: clonazepam, lexapro Status of problem: new to provider Interventions: -- start zoloft 12.5mg  once daily (s10/24/23) -- decrease clonazepam to 0.5mg  twice daily (patient has been taking scheduled as opposed to PRN as previously written for many years) -- referral for CBT  # Shift work insomnia  snoring Past medication trials:  Status of problem: new to provider Interventions: -- reviewed basic sleep hygiene, patient may be getting another sleep study  # Tobacco use disorder: dip Past medication trials:  Status of problem: new to provider Interventions: -- tobacco cessation counseling provided  # Afib  HTN  Dilated cardiomyopathy  systolic CHF Past medication trials:  Status of problem: new to provider Interventions: -- continue eliquis 5mg  tab per cardiology -- continue diltiazem 240mg  24h capsule once daily with 30mg  prn per cardiology -- continue flecainide 150mg  bid per cardiology  # History of alcohol and cannabis use disorder in sustained remission Past medication trials:  Status of problem: new to provider Interventions: -- continue to encourage abstinence  Patient was given contact information for behavioral health clinic and was instructed to call 911 for emergencies.   Subjective:  Chief Complaint:  Chief Complaint  Patient presents with   Anxiety   Depression   Establish Care    History of Present Illness:  Having a lot of anxiety over the last several years. Has a heart condition since 2017. Anxiety has been better of late but still gets anxious in crowds and has to leave. Struggles with anger as well.   Lives with wife and dogs. Married in 2018. Enjoys yard work, gaming, tinkering with firearms, shooting firearms, hanging out with family. Still enjoys but doesn't enjoy gaming as much. Not sleeping well. Trouble  with falling asleep too early will end up laying into bed for several hours without being able to fall  asleep. Out of work since February 2021; previously worked the night shift. Last 6 months has been getting on more regular day night schedule. Wife says that he snores every so often, had sleep test in 2017 but didn't find sleep apnea. No nightmares. Appetite is enhanced. No binges. Has felt urge to work out after eating meals. Tends to snack after meals. Concentration is variable depending on the task. Fidgety a lot of the time. Guilt feelings about a lot of things. No SI current or past. When he is having more heart trouble, will feel sense of impending doom.   Chronic worrier across multiple domains. With his heart condition will have panic attacks, about 5 total since getting diagnosed but may routinely feel like he is dying. Last one 6 months. No period of sleeplessness. No hallucinations. No paranoia. No trauma exposure.  Last drink 2018. Had pancreatitis and heart condition that may have been related to drinking. Would socially drink on weekends he didn't work, would be 10-12 beers per month (4-5 per Saturday). Smoke cigarettes starting at 13 and quit in 2012. Dips daily, can lasts about a day and a half. Plans on quitting when he goes into the hospital to try a new cardiac medication. Smoked pot for similar time frame; multiple blunts at a time.    Past Psychiatric History:  Diagnoses: anxiety with panic attacks, depression Medication trials: clonazepam, lexapro (smallest dose effective but triggered afib at higher dose) Previous psychiatrist/therapist: did psychotherapy in [place] who saw predominantly anxiety Hospitalizations: none Suicide attempts: none SIB: none Hx of violence towards others: none Current access to guns: secured in gunsafe Hx of abuse: none  Previous Psychotropic Medications: Yes   Substance Abuse History in the last 12 months:  No.  Past Medical History:  Past Medical History:  Diagnosis Date   Acute pancreatitis without infection or necrosis 03/05/2016    Atrial fibrillation (HCC)    Atrial flutter (HCC) 01/17/2016   Admx 5/17 with AFlutter with RVR in setting of pancreatitis // CHADS2-VASc=1 (CHF) // Anticoag not started // Amiodarone >> NSR    Cardiomyopathy (HCC)    CHF (congestive heart failure) (HCC)    Chronic lower back pain    Chronic systolic CHF (congestive heart failure) (HCC)    Echo 01/17/16 - EF 35-40%, diffuse HK, mild LAE    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HCC) 02/03/2016   a. EF 35-40% in setting of AFlutter with RVR in 5/17 // probably tachycardia mediated // needs FU echo 2-3 mos after NSR restored // b. Limited Echo 7/17: EF 45-50%, diff HK, mild LAE   Depression    "last 2-3 months; never treated for it" (12/09/2016)   Fatty liver 12/2015   per CT   Gastroenteritis 06/05/2017   Headache    "monthly" (12/09/2016)   Obesity    BMI 45 12/2015   Pancreatic pseudocyst    Pancreatitis 2012   recurrent acute. Admissions to [facility], 12/2015 admissin to [facility] hospital, ct then shows pseudocyst.    Pancreatitis 2012 X 2; 2017 X 2   Pancreatitis 03/05/2016   Paroxysmal atrial fibrillation (HCC)    Typical atrial flutter (HCC) 06/06/2017    Past Surgical History:  Procedure Laterality Date   ATRIAL FIBRILLATION ABLATION N/A 12/09/2016   Procedure: Atrial Fibrillation Ablation;  Surgeon: Hillis Range, MD;  Location: [facility] INVASIVE CV LAB;  Service: Cardiovascular;  Laterality: N/A;  ATRIAL FIBRILLATION ABLATION N/A 11/09/2019   Procedure: ATRIAL FIBRILLATION ABLATION;  Surgeon: Hillis Range, MD;  Location: [facility] INVASIVE CV LAB;  Service: Cardiovascular;  Laterality: N/A;   FRACTURE SURGERY     HAND SURGERY Right ~ 2008   "had pins in it; pulled them"   TEE WITHOUT CARDIOVERSION N/A 12/08/2016   Procedure: TRANSESOPHAGEAL ECHOCARDIOGRAM (TEE);  Surgeon: Jake Bathe, MD;  Location: [facility] ENDOSCOPY;  Service: Cardiovascular;  Laterality: N/A;   TONSILLECTOMY AND ADENOIDECTOMY     "in my teens"    Family Psychiatric History: will assess at next  visit  Family History:  Family History  Problem Relation Age of Onset   Diabetes Mother    Gallbladder disease Mother    Hypertension Father    Supraventricular tachycardia Brother    Cancer Maternal Grandmother    Cancer Maternal Grandfather    Cirrhosis Maternal Grandfather    Stroke Paternal Grandmother    Celiac disease Neg Hx    Breast cancer Neg Hx    Cholelithiasis Neg Hx    Colon cancer Neg Hx    Colon polyps Neg Hx    Crohn's disease Neg Hx    Esophageal cancer Neg Hx    Hepatitis Neg Hx    Inflammatory bowel disease Neg Hx    Liver disease Neg Hx    Pancreatitis Neg Hx    Stomach cancer Neg Hx    Thyroid disease Neg Hx    Ulcerative colitis Neg Hx     Social History:   Social History   Socioeconomic History   Marital status: Married    Spouse name: Not on file   Number of children: 0   Years of education: 12   Highest education level: Not on file  Occupational History    Employer: [employer]  Tobacco Use   Smoking status: Former    Packs/day: 0.50    Types: Cigarettes   Smokeless tobacco: Current    Types: Snuff  Vaping Use   Vaping Use: Never used  Substance and Sexual Activity   Alcohol use: Not Currently    Comment: last drink 2018   Drug use: Not Currently    Types: Marijuana    Comment: 12/09/2016 "daily from age 40 to 89"   Sexual activity: Yes    Birth control/protection: None  Other Topics Concern   Not on file  Social History Narrative   Pt lives in [place] with fiancee.  Works at [employer] in [place].   Social Determinants of Health   Financial Resource Strain: Not on file  Food Insecurity: Not on file  Transportation Needs: Not on file  Physical Activity: Not on file  Stress: Not on file  Social Connections: Not on file    Additional Social History: see HPI  Allergies:   Allergies  Allergen Reactions   Morphine Nausea And Vomiting    Current Medications: Current Outpatient Medications  Medication Sig  Dispense Refill   sertraline (ZOLOFT) 25 MG tablet Take half a tablet (12.5mg ) once daily. 30 tablet 0   acetaminophen (TYLENOL) 325 MG tablet Take 650 mg by mouth as needed.     clonazePAM (KLONOPIN) 0.5 MG tablet Take 1 tablet (0.5 mg total) by mouth 2 (two) times daily. 60 tablet 0   cyanocobalamin (VITAMIN B12) 500 MCG tablet Take 500 mcg by mouth daily.     diltiazem (CARDIZEM CD) 240 MG 24 hr capsule TAKE ONE CAPSULE BY MOUTH EVERY DAY 30 capsule 5   diltiazem (CARDIZEM) 30  MG tablet Take 1 tablet every 4 hours AS NEEDED for heart rate >100 30 tablet 1   ELIQUIS 5 MG TABS tablet TAKE 1 TABLET BY MOUTH TWICE DAILY 60 tablet 5   flecainide (TAMBOCOR) 150 MG tablet Take 1 tablet (150 mg total) by mouth 2 (two) times daily. 60 tablet 6   Multiple Vitamin (ONE-A-DAY MENS PO) Take 1 tablet by mouth every morning.     pantoprazole (PROTONIX) 40 MG tablet TAKE 1 TABLET(40 MG) BY MOUTH DAILY 90 tablet 3   triamcinolone cream (KENALOG) 0.1 % Apply 1 application topically daily as needed (for irritation).      No current facility-administered medications for this visit.    ROS: Review of Systems  Constitutional:  Positive for unexpected weight change. Negative for appetite change.  Gastrointestinal:  Negative for constipation and diarrhea.  Endocrine: Positive for heat intolerance. Negative for cold intolerance.  Neurological:  Positive for headaches. Negative for dizziness.  Psychiatric/Behavioral:  Positive for decreased concentration and sleep disturbance. Negative for self-injury and suicidal ideas. The patient is nervous/anxious.     Objective:  Psychiatric Specialty Exam: There were no vitals taken for this visit.There is no height or weight on file to calculate BMI.  General Appearance: Casual, Neat, Well Groomed, and appears stated age  Eye Contact:  Good  Speech:  Clear and Coherent and Normal Rate  Volume:  Normal  Mood:  Anxious  Affect:  Appropriate, Congruent, and  Constricted  Thought Content: Logical and Hallucinations: None   Suicidal Thoughts:  No  Homicidal Thoughts:  No  Thought Process:  Coherent, Goal Directed, and Linear  Orientation:  Full (Time, Place, and Person)    Memory:  Immediate;   Good Recent;   Good Remote;   Good  Judgment:  Fair  Insight:  Fair  Concentration:  Concentration: Good and Attention Span: Good  Recall:  Good  Fund of Knowledge: Good  Language: Good  Psychomotor Activity:  Increased and Restlessness  Akathisia:  No  AIMS (if indicated): not done  Assets:  Communication Skills Desire for Improvement Financial Resources/Insurance Housing Intimacy Leisure Time Resilience Social Support Talents/Skills Transportation  ADL's:  Impaired  Cognition: WNL  Sleep:  Poor   PE: General: sits comfortably in view of camera; no acute distress  Pulm: no increased work of breathing on room air  MSK: all extremity movements appear intact  Neuro: no focal neurological deficits observed  Gait & Station: unable to assess by video    Metabolic Disorder Labs: No results found for: "HGBA1C", "MPG" No results found for: "PROLACTIN" Lab Results  Component Value Date   CHOL 116 05/11/2018   TRIG 61 05/11/2018   HDL 31 (L) 05/11/2018   CHOLHDL 3.7 05/11/2018   VLDL 12 05/11/2018   LDLCALC 73 05/11/2018   LDLCALC 63 03/05/2016   Lab Results  Component Value Date   TSH 2.70 03/04/2016    Therapeutic Level Labs: No results found for: "LITHIUM" No results found for: "CBMZ" No results found for: "VALPROATE"  Screenings:   Collaboration of Care: Collaboration of Care: Referral or follow-up with counselor/therapist AEB CBT  Patient/Guardian was advised Release of Information must be obtained prior to any record release in order to collaborate their care with an outside provider. Patient/Guardian was advised if they have not already done so to contact the registration department to sign all necessary forms in order  for [place] to release information regarding their care.   Consent: Patient/Guardian gives verbal consent for treatment  and assignment of benefits for services provided during this visit. Patient/Guardian expressed understanding and agreed to proceed.   Televisit via video: I connected with Xabian Zaitz on 06/22/22 at  1:00 PM EDT by a video enabled telemedicine application and verified that I am speaking with the correct person using two identifiers.  Location: Patient: home in [place] Provider: home office   I discussed the limitations of evaluation and management by telemedicine and the availability of in person appointments. The patient expressed understanding and agreed to proceed.  I discussed the assessment and treatment plan with the patient. The patient was provided an opportunity to ask questions and all were answered. The patient agreed with the plan and demonstrated an understanding of the instructions.   The patient was advised to call back or seek an in-person evaluation if the symptoms worsen or if the condition fails to improve as anticipated.  I provided 60 minutes of non-face-to-face time during this encounter.  Elsie Lincoln, MD 10/24/20232:31 PM

## 2022-06-22 NOTE — Patient Instructions (Signed)
We decreased your clonazepam (klonopin) to 0.5mg  twice daily. We will use this lower dose for the next month and plan on decreasing further after that. We added zoloft at 12.5mg  (half a tablet) once daily and will plan on making sure you can physically tolerate it before making any adjustments. Dr. Nehemiah Settle has placed a referral to start psychotherapy. In the meantime, you can look up CBT- I (for insomnia) and start working on that.

## 2022-06-25 ENCOUNTER — Encounter (HOSPITAL_COMMUNITY): Payer: Self-pay

## 2022-06-28 ENCOUNTER — Telehealth (HOSPITAL_COMMUNITY): Payer: Self-pay

## 2022-06-28 ENCOUNTER — Ambulatory Visit (HOSPITAL_COMMUNITY): Payer: 59 | Admitting: Physician Assistant

## 2022-06-28 NOTE — Telephone Encounter (Signed)
Patient called in regarding his [facility] admission. He was scheduled for today to be loaded on Tikosyn. Patient states he is having right side pain within his mid section. 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 His pain has been off and on. Ricky Fenton-PA reached out to Dr. Quentin Ore and he wants to defer his admission at this time. Patient is rescheduled on 11/7. Consulted with Adline Peals and he instructed patient to go back on his Flecainide 150mg  taking one tablet twice daily. On Friday he will need to stop his Flecainide. Communicated with patient and he verbalized understanding.

## 2022-07-06 ENCOUNTER — Other Ambulatory Visit (HOSPITAL_COMMUNITY): Payer: Self-pay

## 2022-07-06 ENCOUNTER — Telehealth (HOSPITAL_COMMUNITY): Payer: Self-pay | Admitting: Pharmacy Technician

## 2022-07-06 ENCOUNTER — Encounter (HOSPITAL_COMMUNITY): Payer: Self-pay | Admitting: Physician Assistant

## 2022-07-06 ENCOUNTER — Inpatient Hospital Stay (HOSPITAL_COMMUNITY)
Admission: AD | Admit: 2022-07-06 | Discharge: 2022-07-09 | DRG: 309 | Disposition: A | Discharge status: Home / Self Care | Payer: 59 | Source: Ambulatory Visit | Attending: Cardiology | Admitting: Cardiology

## 2022-07-06 ENCOUNTER — Ambulatory Visit (HOSPITAL_COMMUNITY)
Admission: RE | Admit: 2022-07-06 | Discharge: 2022-07-06 | Disposition: A | Discharge status: Home / Self Care | Payer: 59 | Source: Ambulatory Visit | Attending: Physician Assistant | Admitting: Physician Assistant

## 2022-07-06 ENCOUNTER — Telehealth (HOSPITAL_COMMUNITY): Payer: 59 | Admitting: Psychiatry

## 2022-07-06 VITALS — BP 120/84 | HR 78 | Ht 72.0 in | Wt 306.0 lb

## 2022-07-06 DIAGNOSIS — I4819 Other persistent atrial fibrillation: Secondary | ICD-10-CM | POA: Diagnosis present

## 2022-07-06 DIAGNOSIS — R519 Headache, unspecified: Secondary | ICD-10-CM | POA: Diagnosis present

## 2022-07-06 DIAGNOSIS — I480 Paroxysmal atrial fibrillation: Principal | ICD-10-CM | POA: Diagnosis present

## 2022-07-06 DIAGNOSIS — I4892 Unspecified atrial flutter: Secondary | ICD-10-CM | POA: Diagnosis present

## 2022-07-06 DIAGNOSIS — K760 Fatty (change of) liver, not elsewhere classified: Secondary | ICD-10-CM | POA: Diagnosis present

## 2022-07-06 DIAGNOSIS — Z7901 Long term (current) use of anticoagulants: Secondary | ICD-10-CM | POA: Diagnosis not present

## 2022-07-06 DIAGNOSIS — I5022 Chronic systolic (congestive) heart failure: Secondary | ICD-10-CM | POA: Diagnosis present

## 2022-07-06 DIAGNOSIS — I110 Hypertensive heart disease with heart failure: Secondary | ICD-10-CM | POA: Diagnosis present

## 2022-07-06 DIAGNOSIS — Z79899 Other long term (current) drug therapy: Secondary | ICD-10-CM | POA: Diagnosis not present

## 2022-07-06 DIAGNOSIS — D6869 Other thrombophilia: Secondary | ICD-10-CM

## 2022-07-06 DIAGNOSIS — Z8249 Family history of ischemic heart disease and other diseases of the circulatory system: Secondary | ICD-10-CM | POA: Diagnosis not present

## 2022-07-06 DIAGNOSIS — R000 Tachycardia, unspecified: Secondary | ICD-10-CM | POA: Diagnosis present

## 2022-07-06 DIAGNOSIS — Z6841 Body Mass Index (BMI) 40.0 and over, adult: Secondary | ICD-10-CM | POA: Diagnosis not present

## 2022-07-06 DIAGNOSIS — G8929 Other chronic pain: Secondary | ICD-10-CM | POA: Diagnosis present

## 2022-07-06 DIAGNOSIS — I420 Dilated cardiomyopathy: Secondary | ICD-10-CM | POA: Diagnosis present

## 2022-07-06 DIAGNOSIS — Z885 Allergy status to narcotic agent status: Secondary | ICD-10-CM

## 2022-07-06 DIAGNOSIS — M545 Low back pain, unspecified: Secondary | ICD-10-CM | POA: Diagnosis present

## 2022-07-06 LAB — BASIC METABOLIC PANEL
Anion gap: 8 (ref 5–15)
Anion gap: 8 (ref 5–15)
BUN: 12 mg/dL (ref 6–20)
BUN: 13 mg/dL (ref 6–20)
CO2: 24 mmol/L (ref 22–32)
CO2: 26 mmol/L (ref 22–32)
Calcium: 9 mg/dL (ref 8.9–10.3)
Calcium: 8.8 mg/dL — ABNORMAL LOW (ref 8.9–10.3)
Chloride: 108 mmol/L (ref 98–111)
Chloride: 108 mmol/L (ref 98–111)
Creatinine, Ser: 0.89 mg/dL (ref 0.61–1.24)
Creatinine, Ser: 0.9 mg/dL (ref 0.61–1.24)
GFR, Estimated: >60 mL/min (ref >60)
GFR, Estimated: >60 mL/min (ref >60)
Glucose, Bld: 100 mg/dL — ABNORMAL HIGH (ref 70–99)
Glucose, Bld: 100 mg/dL — ABNORMAL HIGH (ref 70–99)
Potassium: 3.6 mmol/L (ref 3.5–5.1)
Potassium: 3.8 mmol/L (ref 3.5–5.1)
Sodium: 140 mmol/L (ref 135–145)
Sodium: 142 mmol/L (ref 135–145)

## 2022-07-06 LAB — MAGNESIUM: Magnesium: 2.1 mg/dL (ref 1.7–2.4)

## 2022-07-06 MED ORDER — VITAMIN B-12 100 MCG PO TABS
500.0000 ug | ORAL_TABLET | Freq: Every day | ORAL | Status: DC
  Administered 2022-07-07 – 2022-07-09 (×3): 500 ug via ORAL
  Filled 2022-07-06 (×3): qty 5

## 2022-07-06 MED ORDER — SERTRALINE HCL 25 MG PO TABS
12.5000 mg | ORAL_TABLET | Freq: Every day | ORAL | Status: DC
  Administered 2022-07-07 – 2022-07-09 (×3): 12.5 mg via ORAL
  Filled 2022-07-06 (×3): qty 0.5

## 2022-07-06 MED ORDER — DILTIAZEM HCL 60 MG PO TABS: 30.0000 mg | ORAL_TABLET | ORAL | Status: DC | PRN

## 2022-07-06 MED ORDER — SODIUM CHLORIDE 0.9% FLUSH
3.0000 mL | INTRAVENOUS | Status: DC | PRN
Start: 2022-07-06 — End: 2022-07-09

## 2022-07-06 MED ORDER — APIXABAN 5 MG PO TABS
5.0000 mg | ORAL_TABLET | Freq: Two times a day (BID) | ORAL | Status: DC
  Administered 2022-07-06 – 2022-07-09 (×6): 5 mg via ORAL
  Filled 2022-07-06 (×6): qty 1

## 2022-07-06 MED ORDER — CLONAZEPAM 0.5 MG PO TABS
0.5000 mg | ORAL_TABLET | Freq: Two times a day (BID) | ORAL | Status: DC
  Administered 2022-07-06 – 2022-07-09 (×6): 0.5 mg via ORAL
  Filled 2022-07-06 (×6): qty 1

## 2022-07-06 MED ORDER — POTASSIUM CHLORIDE CRYS ER 20 MEQ PO TBCR
60.0000 meq | EXTENDED_RELEASE_TABLET | ORAL | Status: AC
  Administered 2022-07-06: 60 meq via ORAL
  Filled 2022-07-06: qty 3

## 2022-07-06 MED ORDER — ADULT MULTIVITAMIN W/MINERALS CH
1.0000 | ORAL_TABLET | Freq: Every day | ORAL | Status: DC
  Administered 2022-07-07 – 2022-07-09 (×3): 1 via ORAL
  Filled 2022-07-06 (×3): qty 1

## 2022-07-06 MED ORDER — SODIUM CHLORIDE 0.9% FLUSH
3.0000 mL | Freq: Two times a day (BID) | INTRAVENOUS | Status: DC
  Administered 2022-07-06 – 2022-07-08 (×5): 3 mL via INTRAVENOUS

## 2022-07-06 MED ORDER — POTASSIUM CHLORIDE CRYS ER 20 MEQ PO TBCR
40.0000 meq | EXTENDED_RELEASE_TABLET | Freq: Once | ORAL | Status: AC
  Administered 2022-07-06: 40 meq via ORAL
  Filled 2022-07-06: qty 2

## 2022-07-06 MED ORDER — PANTOPRAZOLE SODIUM 40 MG PO TBEC
40.0000 mg | DELAYED_RELEASE_TABLET | Freq: Every day | ORAL | Status: DC
  Administered 2022-07-07 – 2022-07-09 (×3): 40 mg via ORAL
  Filled 2022-07-06 (×3): qty 1

## 2022-07-06 MED ORDER — DILTIAZEM HCL ER COATED BEADS 240 MG PO CP24
240.0000 mg | ORAL_CAPSULE | Freq: Every day | ORAL | Status: DC
  Administered 2022-07-07 – 2022-07-09 (×3): 240 mg via ORAL
  Filled 2022-07-06 (×3): qty 1

## 2022-07-06 MED ORDER — SODIUM CHLORIDE 0.9 % IV SOLN: 250.0000 mL | INTRAVENOUS | Status: DC | PRN

## 2022-07-06 MED ORDER — ACETAMINOPHEN 325 MG PO TABS: 650.0000 mg | ORAL_TABLET | ORAL | Status: DC | PRN

## 2022-07-06 MED ORDER — DOFETILIDE 500 MCG PO CAPS
500.0000 ug | ORAL_CAPSULE | Freq: Two times a day (BID) | ORAL | Status: DC
  Administered 2022-07-06 – 2022-07-09 (×6): 500 ug via ORAL
  Filled 2022-07-06 (×6): qty 1

## 2022-07-06 NOTE — Progress Notes (Signed)
Pharmacy: Dofetilide (Tikosyn) - Initial Consult Assessment and Electrolyte Replacement  Pharmacy consulted to assist in monitoring and replacing electrolytes in this 31 y.o. male admitted on 07/06/2022 undergoing dofetilide initiation. First dofetilide dose:   Assessment:  Patient Exclusion Criteria: If any screening criteria checked as "Yes", then  patient  should NOT receive dofetilide until criteria item is corrected.  If "Yes" please indicate correction plan.  YES  NO Patient  Exclusion Criteria Correction Plan   []   [x]   Baseline QTc interval is greater than or equal to 440 msec. IF above YES box checked dofetilide contraindicated unless patient has ICD; then may proceed if QTc 500-550 msec or with known ventricular conduction abnormalities may proceed with QTc 550-600 msec. QTc = 451ms    []   [x]   Patient is known or suspected to have a digoxin level greater than 2 ng/ml: No results found for: "DIGOXIN"     []   [x]   Creatinine clearance less than 20 ml/min (calculated using Cockcroft-Gault, actual body weight and serum creatinine): Estimated Creatinine Clearance: 172.1 mL/min (by C-G formula based on SCr of 0.89 mg/dL).     []   [x]  Patient has received drugs known to prolong the QT intervals within the last 48 hours (phenothiazines, tricyclics or tetracyclic antidepressants, erythromycin, H-1 antihistamines, cisapride, fluoroquinolones, azithromycin, ondansetron).   Updated information on QT prolonging agents is available to be searched on the following database:QT prolonging agents  Low dose sertraline, md aware   []   [x]   Patient received a dose of hydrochlorothiazide (Oretic) alone or in any combination including triamterene (Dyazide, Maxzide) in the last 48 hours.    []   [x]  Patient received a medication known to increase dofetilide plasma concentrations prior to initial dofetilide dose:  Trimethoprim (Primsol, Proloprim) in the last 36 hours Verapamil (Calan,  Verelan) in the last 36 hours or a sustained release dose in the last 72 hours Megestrol (Megace) in the last 5 days  Cimetidine (Tagamet) in the last 6 hours Ketoconazole (Nizoral) in the last 24 hours Itraconazole (Sporanox) in the last 48 hours  Prochlorperazine (Compazine) in the last 36 hours     []   [x]   Patient is known to have a history of torsades de pointes; congenital or acquired long QT syndromes.    []   [x]   Patient has received a Class 1 antiarrhythmic with less than 2 half-lives since last dose. (Disopyramide, Quinidine, Procainamide, Lidocaine, Mexiletine, Flecainide, Propafenone) Flecainide stopped   []   [x]   Patient has received amiodarone therapy in the past 3 months or amiodarone level is greater than 0.3 ng/ml.    Labs:    Component Value Date/Time   K 3.6 07/06/2022 0959   MG 2.1 07/06/2022 0959     Plan: Select One Calculated CrCl  Dose q12h  [x]  > 60 ml/min 500 mcg  []  40-60 ml/min 250 mcg  []  20-40 ml/min 125 mcg   [x]   Physician selected initial dose within range recommended for patients level of renal function - will monitor for response.  []   Physician selected initial dose outside of range recommended for patients level of renal function - will discuss if the dose should be altered at this time.   Patient has been appropriately anticoagulated with apixaban.  Potassium: K 3.5-3.7:  Hold Tikosyn initiation and give KCl 60 mEq po x1 and repeat BMET 2hr after dose - repeat appropriate dose if K < 4    Magnesium: Mg >2: Appropriate to initiate Tikosyn, no replacement needed  Thank you for allowing pharmacy to participate in this patient's care   Sheppard Coil PharmD., BCPS Clinical Pharmacist 07/06/2022 2:34 PM

## 2022-07-06 NOTE — Progress Notes (Signed)
Primary Care Physician: Neale Burly, MD Referring Physician: Dr. Rayann Heman Primary EP: Dr Quentin Ore   Nathaniel [name] is a 31 y.o. male with a h/o paroxysmal afib that is in the afib clinic for increased palpitations. He had a afib ablation in 2018. He has been maintained with flecainide and metoprolol. Is on eliquis 5 mg bid for CHA2DS2VASc score of 2. He has noted increased palpitations since he was hospitalized with acute on chronic  Pancreatitis 10/11/19. [name]  He had a bad episode at work the other night but today is in [place]. He also had covid last summer at which time he has increase of afib episodes.  He is interested in redo ablation if needed.   F/u in afib clinic, 11/28/19. He had redo  ablation 3/12 but sent a my chart message that he was having issues with intermittent afib and asked to  be seen. On arrival, he was in [place]. Pt states that he is scared to go to work as he gets so apprehensive when he goes into afib that he cant function.. His 30 mg Cardizem tab will usually convert him quickly. He has issues with anxiety and panic attacks. He mostly will feel anxiety and then the afib kicks in. He presented in [place] but while talking about his anxiety he went into afib by ausculation. No swallowing or groin issues since the procedure. We discussed today as well, that some afib after the procedure can be expected.   F/u in afib clinic, 11/05/21. He saw Dr. Rayann Heman 3 months ago. He states that he still has breakthrough afib. May last a couple of hours. He did get second opinion at [facility] and they discussed doing a Maze procedure and pt  was not ready. Dr. Rayann Heman also has mentioned convergent procedure in the past. Continues on flecainide 150 mg bid.  Eliquis 5 mg bid for a CHA2DS2VASc  score of 2.   Follow up in the AF clinic 07/06/22. Patient presents for dofetilide admission. He continues to go in and out of afib frequently. His dofetilide admission was rescheduled for today as he was having abdominal pain  2/2 chronic pancreatitis, now resolved. He denies any missed doses of anticoagulation.   Today, he denies symptoms of  chest pain, shortness of breath, orthopnea, PND, lower extremity edema, dizziness, presyncope, syncope, or neurologic sequela. The patient is tolerating medications without difficulties and is otherwise without complaint today.   Past Medical History:  Diagnosis Date   Acute pancreatitis without infection or necrosis 03/05/2016   Atrial fibrillation (HCC)    Atrial flutter ([place]) 01/17/2016   Admx 5/17 with AFlutter with RVR in setting of pancreatitis // CHADS2-VASc=1 (CHF) // Anticoag not started // Amiodarone >> NSR    Cardiomyopathy (HCC)    CHF (congestive heart failure) (HCC)    Chronic lower back pain    Chronic systolic CHF (congestive heart failure) ([place])    Echo 01/17/16 - EF 35-40%, diffuse HK, mild LAE    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[place]) 02/03/2016   a. EF 35-40% in setting of AFlutter with RVR in 5/17 // probably tachycardia mediated // needs FU echo 2-3 mos after NSR restored // b. Limited Echo 7/17: EF 45-50%, diff HK, mild LAE   Depression    "last 2-3 months; never treated for it" (12/09/2016)   Fatty liver 12/2015   per CT   Gastroenteritis 06/05/2017   Headache    "monthly" (12/09/2016)   Obesity    BMI 45 12/2015  Pancreatic pseudocyst    Pancreatitis 2012   recurrent acute. Admissions to [facility], 12/2015 admissin to [place], ct then shows pseudocyst.    Pancreatitis 2012 X 2; 2017 X 2   Pancreatitis 03/05/2016   Paroxysmal atrial fibrillation (HCC)    Typical atrial flutter ([place]) 06/06/2017   Past Surgical History:  Procedure Laterality Date   ATRIAL FIBRILLATION ABLATION N/A 12/09/2016   Procedure: Atrial Fibrillation Ablation;  Surgeon: Thompson Grayer, MD;  Location: [facility];  Service: Cardiovascular;  Laterality: N/A;   ATRIAL FIBRILLATION ABLATION N/A 11/09/2019   Procedure: ATRIAL FIBRILLATION ABLATION;  Surgeon: Thompson Grayer, MD;   Location: [facility];  Service: Cardiovascular;  Laterality: N/A;   FRACTURE SURGERY     HAND SURGERY Right ~ 2008   "had pins in it; pulled them"   TEE WITHOUT CARDIOVERSION N/A 12/08/2016   Procedure: TRANSESOPHAGEAL ECHOCARDIOGRAM (TEE);  Surgeon: Jerline Pain, MD;  Location: [facility] ENDOSCOPY;  Service: Cardiovascular;  Laterality: N/A;   TONSILLECTOMY AND ADENOIDECTOMY     "in my teens"    Current Outpatient Medications  Medication Sig Dispense Refill   acetaminophen (TYLENOL) 325 MG tablet Take 650 mg by mouth as needed.     clonazePAM (KLONOPIN) 0.5 MG tablet Take 1 tablet (0.5 mg total) by mouth 2 (two) times daily. 60 tablet 0   cyanocobalamin (VITAMIN B12) 500 MCG tablet Take 500 mcg by mouth daily.     diltiazem (CARDIZEM CD) 240 MG 24 hr capsule TAKE ONE CAPSULE BY MOUTH EVERY DAY 30 capsule 5   ELIQUIS 5 MG TABS tablet TAKE 1 TABLET BY MOUTH TWICE DAILY 60 tablet 5   Multiple Vitamin (ONE-A-DAY MENS PO) Take 1 tablet by mouth every morning.     pantoprazole (PROTONIX) 40 MG tablet TAKE 1 TABLET(40 MG) BY MOUTH DAILY 90 tablet 3   sertraline (ZOLOFT) 25 MG tablet Take half a tablet (12.5mg ) once daily. 30 tablet 0   triamcinolone cream (KENALOG) 0.1 % Apply 1 application topically daily as needed (for irritation).      diltiazem (CARDIZEM) 30 MG tablet Take 1 tablet every 4 hours AS NEEDED for heart rate >100 (Patient not taking: Reported on 07/06/2022) 30 tablet 1   No current facility-administered medications for this encounter.    Allergies  Allergen Reactions   Morphine Nausea And Vomiting    Social History   Socioeconomic History   Marital status: Married    Spouse name: Not on file   Number of children: 0   Years of education: 12   Highest education level: Not on file  Occupational History    Employer: [employer]  Tobacco Use   Smoking status: Former    Packs/day: 0.50    Types: Cigarettes   Smokeless tobacco: Current    Types: Snuff  Vaping  Use   Vaping Use: Never used  Substance and Sexual Activity   Alcohol use: Not Currently    Comment: last drink 2018   Drug use: Not Currently    Types: Marijuana    Comment: 12/09/2016 "daily from age 69 to 55"   Sexual activity: Yes    Birth control/protection: None  Other Topics Concern   Not on file  Social History Narrative   Pt lives in [place] with fiancee.  Works at [employer] in [place].   Social Determinants of Health   Financial Resource Strain: Not on file  Food Insecurity: Not on file  Transportation Needs: Not on file  Physical Activity: Not  on file  Stress: Not on file  Social Connections: Not on file  Intimate Partner Violence: Not on file    Family History  Problem Relation Age of Onset   Diabetes Mother    Gallbladder disease Mother    Hypertension Father    Supraventricular tachycardia Brother    Cancer Maternal Grandmother    Cancer Maternal Grandfather    Cirrhosis Maternal Grandfather    Stroke Paternal Grandmother    Celiac disease Neg Hx    Breast cancer Neg Hx    Cholelithiasis Neg Hx    Colon cancer Neg Hx    Colon polyps Neg Hx    Crohn's disease Neg Hx    Esophageal cancer Neg Hx    Hepatitis Neg Hx    Inflammatory bowel disease Neg Hx    Liver disease Neg Hx    Pancreatitis Neg Hx    Stomach cancer Neg Hx    Thyroid disease Neg Hx    Ulcerative colitis Neg Hx     ROS- All systems are reviewed and negative except as per the HPI above  Physical Exam: Vitals:   07/06/22 0945  BP: 120/84  Pulse: 78  SpO2: 98%  Weight: (!) 138.8 kg  Height: 6' (1.829 m)    Wt Readings from Last 3 Encounters:  07/06/22 (!) 138.8 kg  05/21/22 (!) 145.6 kg  11/05/21 (!) 143 kg    Labs: Lab Results  Component Value Date   NA 140 07/06/2022   K 3.6 07/06/2022   CL 108 07/06/2022   CO2 24 07/06/2022   GLUCOSE 100 (H) 07/06/2022   BUN 12 07/06/2022   CREATININE 0.89 07/06/2022   CALCIUM 9.0 07/06/2022   PHOS 3.8 05/09/2018   MG 2.1  07/06/2022   Lab Results  Component Value Date   INR 1.22 01/18/2016   Lab Results  Component Value Date   CHOL 116 05/11/2018   HDL 31 (L) 05/11/2018   LDLCALC 73 05/11/2018   TRIG 61 05/11/2018     GEN- The patient is a well appearing obese male, alert and oriented x 3 today.   HEENT-head normocephalic, atraumatic, sclera clear, conjunctiva pink, hearing intact, trachea midline. Lungs- Clear to ausculation bilaterally, normal work of breathing Heart- irregular rate and rhythm, no murmurs, rubs or gallops  GI- soft, NT, ND, + BS Extremities- no clubbing, cyanosis, or edema MS- no significant deformity or atrophy Skin- no rash or lesion Psych- euthymic mood, full affect Neuro- strength and sensation are intact   EKG-  Afib Vent. rate 124 BPM PR interval * ms QRS duration 86 ms QT/QTcB 318/456 ms   Assessment and Plan: 1. Persistent Afib S/p ablation in 2018 and redo 2021 Patient presents for dofetilide admission.  Continue Eliquis 5 mg BID, states no missed doses in the last 3 weeks. No recent benadryl use PharmD has screened medications, flecainide discontinued. He is on a very low dose of sertraline.  QTc in SR 445 ms Labs today show creatinine at 0.89, K+ 3.6 and mag 2.1, CrCl calculated at 234 mL/min. K+ will need to be supplemented prior to initiation.  Continue diltiazem 240 mg daily with 30 mg PRN for heart racing.   CHA2DS2VASc of 2   2. HTN Stable, no changes today.   To be admitted later today once a bed becomes available.    [facility] [address] [place], [place] [address] [phone]

## 2022-07-06 NOTE — Progress Notes (Signed)
Pt admitted to [place], VS as per flow. Pt oriented to 6E processes. Afib on telemetry. All questions and concerns addressed. Spouse bedside. Call bell placed within reach, will continue to monitor and maintain safety.

## 2022-07-06 NOTE — Telephone Encounter (Signed)
Pharmacy Patient Advocate Encounter  Insurance verification completed.    The patient is insured through [employer]   The patient is currently admitted and ran test claims for the following: dofetilide (Tikosyn) .  Copays and coinsurance results were relayed to Inpatient clinical team.

## 2022-07-06 NOTE — TOC Benefit Eligibility Note (Signed)
Patient Teacher, English as a foreign language completed.    The patient is currently admitted and upon discharge could be taking dofetilide (Tikosyn) 500 mcg capsules.  Prior Authorization Required  The patient is insured through [place] Patient Advocate Specialist [place] Patient Advocate Team Direct Number: [phone]  Fax: [phone]

## 2022-07-06 NOTE — Progress Notes (Signed)
Post Tikosyn Qtc 436 per EKG. Pt currently in [place], but has had some intermittent Afib on tele. Will continue to monitor. Jessie Foot, RN

## 2022-07-06 NOTE — Progress Notes (Signed)
Pharmacy: Dofetilide (Tikosyn) - Follow Up Assessment and Electrolyte Replacement  Pharmacy consulted to assist in monitoring and replacing electrolytes in this 31 y.o. male admitted on 07/06/2022 undergoing dofetilide initiation.   Labs:    Component Value Date/Time   K 3.8 07/06/2022 1752   MG 2.1 07/06/2022 0959     Plan: Potassium: K 3.8-3.9:  Give KCl 40 mEq po x1   Magnesium: Mg > 2: No additional supplementation needed   Thank you for allowing pharmacy to participate in this patient's care    Hildred Laser, PharmD Clinical Pharmacist **Pharmacist phone directory can now be found on [email or web address] ([email or web address]).  Listed under [place].

## 2022-07-06 NOTE — Progress Notes (Signed)
Primary Care Physician: Neale Burly, MD Referring Physician: Dr. Rayann Heman Primary EP: Dr Quentin Ore   Nathaniel [name] is a 31 y.o. male with a h/o paroxysmal afib that is in the afib clinic for increased palpitations. He had a afib ablation in 2018. He has been maintained with flecainide and metoprolol. Is on eliquis 5 mg bid for CHA2DS2VASc score of 2. He has noted increased palpitations since he was hospitalized with acute on chronic  Pancreatitis 10/11/19. [name]  He had a bad episode at work the other night but today is in [place]. He also had covid last summer at which time he has increase of afib episodes.  He is interested in redo ablation if needed.   F/u in afib clinic, 11/28/19. He had redo  ablation 3/12 but sent a my chart message that he was having issues with intermittent afib and asked to  be seen. On arrival, he was in [place]. Pt states that he is scared to go to work as he gets so apprehensive when he goes into afib that he cant function.. His 30 mg Cardizem tab Brinna Divelbiss usually convert him quickly. He has issues with anxiety and panic attacks. He mostly See [place] feel anxiety and then the afib kicks in. He presented in [place] but while talking about his anxiety he went into afib by ausculation. No swallowing or groin issues since the procedure. We discussed today as well, that some afib after the procedure can be expected.   F/u in afib clinic, 11/05/21. He saw Dr. Rayann Heman 3 months ago. He states that he still has breakthrough afib. May last a couple of hours. He did get second opinion at [facility] and they discussed doing a Maze procedure and pt  was not ready. Dr. Rayann Heman also has mentioned convergent procedure in the past. Continues on flecainide 150 mg bid.  Eliquis 5 mg bid for a CHA2DS2VASc  score of 2.   Follow up in the AF clinic 07/06/22. Patient presents for dofetilide admission. He continues to go in and out of afib frequently. His dofetilide admission was rescheduled for today as he was having abdominal pain  2/2 chronic pancreatitis, now resolved. He denies any missed doses of anticoagulation.   Today, he denies symptoms of  chest pain, shortness of breath, orthopnea, PND, lower extremity edema, dizziness, presyncope, syncope, or neurologic sequela. The patient is tolerating medications without difficulties and is otherwise without complaint today.   Past Medical History:  Diagnosis Date   Acute pancreatitis without infection or necrosis 03/05/2016   Atrial fibrillation (HCC)    Atrial flutter ([place]) 01/17/2016   Admx 5/17 with AFlutter with RVR in setting of pancreatitis // CHADS2-VASc=1 (CHF) // Anticoag not started // Amiodarone >> NSR    Cardiomyopathy (HCC)    CHF (congestive heart failure) (HCC)    Chronic lower back pain    Chronic systolic CHF (congestive heart failure) ([place])    Echo 01/17/16 - EF 35-40%, diffuse HK, mild LAE    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[place]) 02/03/2016   a. EF 35-40% in setting of AFlutter with RVR in 5/17 // probably tachycardia mediated // needs FU echo 2-3 mos after NSR restored // b. Limited Echo 7/17: EF 45-50%, diff HK, mild LAE   Depression    "last 2-3 months; never treated for it" (12/09/2016)   Fatty liver 12/2015   per CT   Gastroenteritis 06/05/2017   Headache    "monthly" (12/09/2016)   Obesity    BMI 45 12/2015  Pancreatic pseudocyst    Pancreatitis 2012   recurrent acute. Admissions to [facility], 12/2015 admissin to [place], ct then shows pseudocyst.    Pancreatitis 2012 X 2; 2017 X 2   Pancreatitis 03/05/2016   Paroxysmal atrial fibrillation (HCC)    Typical atrial flutter ([place]) 06/06/2017   Past Surgical History:  Procedure Laterality Date   ATRIAL FIBRILLATION ABLATION N/A 12/09/2016   Procedure: Atrial Fibrillation Ablation;  Surgeon: Thompson Grayer, MD;  Location: [facility];  Service: Cardiovascular;  Laterality: N/A;   ATRIAL FIBRILLATION ABLATION N/A 11/09/2019   Procedure: ATRIAL FIBRILLATION ABLATION;  Surgeon: Thompson Grayer, MD;   Location: [facility];  Service: Cardiovascular;  Laterality: N/A;   FRACTURE SURGERY     HAND SURGERY Right ~ 2008   "had pins in it; pulled them"   TEE WITHOUT CARDIOVERSION N/A 12/08/2016   Procedure: TRANSESOPHAGEAL ECHOCARDIOGRAM (TEE);  Surgeon: Jerline Pain, MD;  Location: [facility] ENDOSCOPY;  Service: Cardiovascular;  Laterality: N/A;   TONSILLECTOMY AND ADENOIDECTOMY     "in my teens"    Current Facility-Administered Medications  Medication Dose Route Frequency Provider Last Rate Last Admin   0.9 %  sodium chloride infusion  250 mL Intravenous PRN Fenton, Clint R, PA       acetaminophen (TYLENOL) tablet 650 mg  650 mg Oral PRN Fenton, Clint R, PA       apixaban (ELIQUIS) tablet 5 mg  5 mg Oral BID Fenton, Clint R, PA       clonazePAM (KLONOPIN) tablet 0.5 mg  0.5 mg Oral BID Fenton, Clint R, PA       [START ON 07/07/2022] diltiazem (CARDIZEM CD) 24 hr capsule 240 mg  240 mg Oral Daily Fenton, Clint R, PA       diltiazem (CARDIZEM) tablet 30 mg  30 mg Oral Q4H PRN Fenton, Clint R, PA       dofetilide (TIKOSYN) capsule 500 mcg  500 mcg Oral BID Fenton, Clint R, PA       [START ON 07/07/2022] multivitamin with minerals tablet 1 tablet  1 tablet Oral Daily Fenton, Clint R, PA       [START ON 07/07/2022] pantoprazole (PROTONIX) EC tablet 40 mg  40 mg Oral Daily Fenton, Clint R, PA       [START ON 07/07/2022] sertraline (ZOLOFT) tablet 12.5 mg  12.5 mg Oral Daily Fenton, Clint R, PA       sodium chloride flush (NS) 0.9 % injection 3 mL  3 mL Intravenous Q12H Fenton, Clint R, PA   3 mL at 07/06/22 1500   sodium chloride flush (NS) 0.9 % injection 3 mL  3 mL Intravenous PRN Fenton, Clint R, PA       [START ON 07/07/2022] vitamin B-12 (CYANOCOBALAMIN) tablet 500 mcg  500 mcg Oral Daily Fenton, Clint R, PA        Allergies  Allergen Reactions   Morphine Nausea And Vomiting    Social History   Socioeconomic History   Marital status: Married    Spouse name: Not on file   Number of  children: 0   Years of education: 12   Highest education level: Not on file  Occupational History    Employer: [employer]  Tobacco Use   Smoking status: Former    Packs/day: 0.50    Types: Cigarettes   Smokeless tobacco: Current    Types: Snuff  Vaping Use   Vaping Use: Never used  Substance and Sexual Activity  Alcohol use: Not Currently    Comment: last drink 2018   Drug use: Not Currently    Types: Marijuana    Comment: 12/09/2016 "daily from age 39 to 73"   Sexual activity: Yes    Birth control/protection: None  Other Topics Concern   Not on file  Social History Narrative   Pt lives in [place] with fiancee.  Works at [employer] in [place].   Social Determinants of Health   Financial Resource Strain: Not on file  Food Insecurity: Not on file  Transportation Needs: Not on file  Physical Activity: Not on file  Stress: Not on file  Social Connections: Not on file  Intimate Partner Violence: Not on file    Family History  Problem Relation Age of Onset   Diabetes Mother    Gallbladder disease Mother    Hypertension Father    Supraventricular tachycardia Brother    Cancer Maternal Grandmother    Cancer Maternal Grandfather    Cirrhosis Maternal Grandfather    Stroke Paternal Grandmother    Celiac disease Neg Hx    Breast cancer Neg Hx    Cholelithiasis Neg Hx    Colon cancer Neg Hx    Colon polyps Neg Hx    Crohn's disease Neg Hx    Esophageal cancer Neg Hx    Hepatitis Neg Hx    Inflammatory bowel disease Neg Hx    Liver disease Neg Hx    Pancreatitis Neg Hx    Stomach cancer Neg Hx    Thyroid disease Neg Hx    Ulcerative colitis Neg Hx     ROS- All systems are reviewed and negative except as per the HPI above  Physical Exam: Vitals:   07/06/22 1337 07/06/22 1526  BP: 113/79   Pulse: 75   Resp: 18   Temp: 97.9 F (36.6 C)   TempSrc: Oral   SpO2: 97%   Weight: (!) 143.3 kg (!) 141.9 kg  Height: 6' (1.829 m)     Wt Readings from Last  3 Encounters:  07/06/22 (!) 141.9 kg  07/06/22 (!) 138.8 kg  05/21/22 (!) 145.6 kg    Labs: Lab Results  Component Value Date   NA 140 07/06/2022   K 3.6 07/06/2022   CL 108 07/06/2022   CO2 24 07/06/2022   GLUCOSE 100 (H) 07/06/2022   BUN 12 07/06/2022   CREATININE 0.89 07/06/2022   CALCIUM 9.0 07/06/2022   PHOS 3.8 05/09/2018   MG 2.1 07/06/2022   Lab Results  Component Value Date   INR 1.22 01/18/2016   Lab Results  Component Value Date   CHOL 116 05/11/2018   HDL 31 (L) 05/11/2018   LDLCALC 73 05/11/2018   TRIG 61 05/11/2018     GEN- The patient is a well appearing obese male, alert and oriented x 3 today.   HEENT-head normocephalic, atraumatic, sclera clear, conjunctiva pink, hearing intact, trachea midline. Lungs- Clear to ausculation bilaterally, normal work of breathing Heart- irregular rate and rhythm, no murmurs, rubs or gallops  GI- soft, NT, ND, + BS Extremities- no clubbing, cyanosis, or edema MS- no significant deformity or atrophy Skin- no rash or lesion Psych- euthymic mood, full affect Neuro- strength and sensation are intact   EKG-  Afib Vent. rate 124 BPM PR interval * ms QRS duration 86 ms QT/QTcB 318/456 ms   Assessment and Plan: 1. Persistent Afib S/p ablation in 2018 and redo 2021 Patient presents for dofetilide admission.  Continue Eliquis 5 mg  BID, states no missed doses in the last 3 weeks. No recent benadryl use PharmD has screened medications, flecainide discontinued. He is on a very low dose of sertraline.  QTc in SR 445 ms Labs today show creatinine at 0.89, K+ 3.6 and mag 2.1, CrCl calculated at 234 mL/min. K+ Adaya Garmany need to be supplemented prior to initiation.  Continue diltiazem 240 mg daily with 30 mg PRN for heart racing.   CHA2DS2VASc of 2   2. HTN Stable, no changes today.   To be admitted later today once a bed becomes available.    Jorja Loa PA-C Afib Clinic [facility] [address] [place], [place] [address] [phone]  I have seen and examined this patient with Jorja Loa.  Agree with above, note added to reflect my findings.  Patient admitted to the hospital for dofetilide load.  He does have a history of atrial fibrillation and is status post ablation.  He was initially on flecainide, but had more frequent episodes of atrial fibrillation and thus has been admitted for dofetilide.  GEN: Well nourished, well developed, in no acute distress  HEENT: normal  Neck: no JVD, carotid bruits, or masses Cardiac: RRR; no murmurs, rubs, or gallops,no edema  Respiratory:  clear to auscultation bilaterally, normal work of breathing GI: soft, nontender, nondistended, + BS MS: no deformity or atrophy  Skin: warm and dry Neuro:  Strength and sensation are intact Psych: euthymic mood, full affect   Persistent atrial fibrillation: Status post ablation x2, most recently 2021.  Had previously been on flecainide but has been having more frequent episodes of atrial fibrillation.  Plan for dofetilide load today.  QTc stable.  We Joseluis Alessio check an EKG after each dose.  Potassium greater than 4, magnesium greater than 2.  Patrik Turnbaugh replete as necessary.  Latrecia Capito M. Elmer Merwin MD 07/06/2022 4:24 PM

## 2022-07-07 ENCOUNTER — Telehealth (HOSPITAL_COMMUNITY): Payer: Self-pay | Admitting: Pharmacy Technician

## 2022-07-07 ENCOUNTER — Other Ambulatory Visit (HOSPITAL_COMMUNITY): Payer: Self-pay

## 2022-07-07 DIAGNOSIS — I480 Paroxysmal atrial fibrillation: Secondary | ICD-10-CM | POA: Diagnosis not present

## 2022-07-07 LAB — BASIC METABOLIC PANEL
Anion gap: 7 (ref 5–15)
BUN: 13 mg/dL (ref 6–20)
CO2: 24 mmol/L (ref 22–32)
Calcium: 8.8 mg/dL — ABNORMAL LOW (ref 8.9–10.3)
Chloride: 108 mmol/L (ref 98–111)
Creatinine, Ser: 0.81 mg/dL (ref 0.61–1.24)
GFR, Estimated: >60 mL/min (ref >60)
Glucose, Bld: 92 mg/dL (ref 70–99)
Potassium: 4.2 mmol/L (ref 3.5–5.1)
Sodium: 139 mmol/L (ref 135–145)

## 2022-07-07 LAB — HIV ANTIBODY (ROUTINE TESTING W REFLEX): HIV Screen 4th Generation wRfx: NONREACTIVE

## 2022-07-07 LAB — MAGNESIUM: Magnesium: 2 mg/dL (ref 1.7–2.4)

## 2022-07-07 MED ORDER — SODIUM CHLORIDE 0.9 % IV SOLN: INTRAVENOUS | Status: DC

## 2022-07-07 MED ORDER — MAGNESIUM SULFATE 2 GM/50ML IV SOLN
2.0000 g | Freq: Once | INTRAVENOUS | Status: AC
  Administered 2022-07-07: 2 g via INTRAVENOUS
  Filled 2022-07-07: qty 50

## 2022-07-07 NOTE — Progress Notes (Signed)
Pharmacy: Dofetilide (Tikosyn) - Follow Up Assessment and Electrolyte Replacement  Pharmacy consulted to assist in monitoring and replacing electrolytes in this 31 y.o. male admitted on 07/06/2022 undergoing dofetilide initiation.   Labs:    Component Value Date/Time   K 4.2 07/07/2022 0302   MG 2.0 07/07/2022 0302     Plan: Potassium: K >/= 4: No additional supplementation needed  Magnesium: Mg 1.8-2: Give Mg 2 gm IV x1   Prior authorization is in process for dofetilide.   Thank you for allowing pharmacy to participate in this patient's care   Sheppard Coil PharmD., BCPS Clinical Pharmacist 07/07/2022 7:53 AM

## 2022-07-07 NOTE — Progress Notes (Addendum)
Rounding Note    Patient Name: Nathaniel [name] Date of Encounter: 07/07/2022  [facility] Cardiologist: Dr. Lalla Brothers  Subjective   Slept well, no complaints  Inpatient Medications    Scheduled Meds:  apixaban  5 mg Oral BID   clonazePAM  0.5 mg Oral BID   diltiazem  240 mg Oral Daily   dofetilide  500 mcg Oral BID   multivitamin with minerals  1 tablet Oral Daily   pantoprazole  40 mg Oral Daily   sertraline  12.5 mg Oral Daily   sodium chloride flush  3 mL Intravenous Q12H   cyanocobalamin  500 mcg Oral Daily   Continuous Infusions:  sodium chloride     PRN Meds: sodium chloride, acetaminophen, diltiazem, sodium chloride flush   Vital Signs    Vitals:   07/06/22 1526 07/06/22 1730 07/06/22 2006 07/07/22 0518  BP:  111/65 120/77 104/71  Pulse:  71  72  Resp:  18 16 20   Temp:  97.9 F (36.6 C) 98 F (36.7 C) 97.8 F (36.6 C)  TempSrc:  Oral Oral Oral  SpO2:  97%  96%  Weight: (!) 141.9 kg     Height:       No intake or output data in the 24 hours ending 07/07/22 0700    07/06/2022    3:26 PM 07/06/2022    1:37 PM 07/06/2022    9:45 AM  Last 3 Weights  Weight (lbs) 312 lb 13.3 oz 316 lb 306 lb  Weight (kg) 141.9 kg 143.337 kg 138.801 kg      Telemetry    In/out AFib, rates controlled, sinus rates 60's - Personally Reviewed  ECG    SR 60bpm, Qtc 13/02/2022 - Personally Reviewed  Physical Exam   GEN: No acute distress.   Neck: No JVD Cardiac: RRR, no murmurs, rubs, or gallops.  Respiratory: CAT b/l. GI: Soft, nontender, non-distended  MS: No edema; No deformity. Neuro:  Nonfocal  Psych: Normal affect   Labs    High Sensitivity Troponin:  No results for input(s): "TROPONINIHS" in the last 720 hours.   Chemistry Recent Labs  Lab 07/06/22 0959 07/06/22 1752 07/07/22 0302  NA 140 142 139  K 3.6 3.8 4.2  CL 108 108 108  CO2 24 26 24   GLUCOSE 100* 100* 92  BUN 12 13 13   CREATININE 0.89 0.90 0.81  CALCIUM 9.0 8.8* 8.8*  MG 2.1   --  2.0  GFRNONAA >60 >60 >60  ANIONGAP 8 8 7     Lipids No results for input(s): "CHOL", "TRIG", "HDL", "LABVLDL", "LDLCALC", "CHOLHDL" in the last 168 hours.  HematologyNo results for input(s): "WBC", "RBC", "HGB", "HCT", "MCV", "MCH", "MCHC", "RDW", "PLT" in the last 168 hours. Thyroid No results for input(s): "TSH", "FREET4" in the last 168 hours.  BNPNo results for input(s): "BNP", "PROBNP" in the last 168 hours.  DDimer No results for input(s): "DDIMER" in the last 168 hours.   Radiology    No results found.  Cardiac Studies    01/25/2019: TTE 1. The left ventricle has a visually estimated ejection fraction of 55%.  The cavity size was normal. Left ventricular diastolic parameters were  normal.   2. The right ventricle has normal systolic function. The cavity was  normal. There is no increase in right ventricular wall thickness.   3. The mitral valve is grossly normal. There is mild mitral annular  calcification present.   4. The tricuspid valve is grossly normal.  5. The aortic valve is tricuspid.   6. The aortic root is normal in size and structure.   11/08/21 EPS/Ablation (re-do PVI, + CTI) CONCLUSIONS: 1. Sinus rhythm upon presentation.   2. Intracardiac echo reveals a moderate sized left atrium with four separate pulmonary veins without evidence of pulmonary vein stenosis.  There was a common ostium to the left PVs.  The right superior PV was large. 3. Return of electric conduction within the right superior pulmonary vein at baseline, more prominent after adenosine infusion.  The right inferior, left superior and left inferior PVs were quiescent from a prior ablation and did not require additional ablation today. 4. Successful electrical reisolation of the right superior pulmonary vein with radiofrequency current.    5. Right atrial flutter induced with isuprel infusion and catheter manipulation which spontaneously terminated and was not sufficient in duration for  mapping. 6. Return of conduction along the previously ablated Cavo-tricuspid isthmus 7. Ablation was performed along the CTI today with complete bidirectional isthmus block achieved.  8. No inducible sustained arrhythmias following ablation both on and off of Isuprel 9. No early apparent complications.  12/09/2016: EPS/Ablation CONCLUSIONS: 1. Sinus rhythm upon presentation.   2. Intracardiac echo reveals a short common ostium to the left superior and inferior pulmonary veins.  These were moderate in size.  The right superior pulmonary vein was very large in sinus and had two large branches.  The right inferior pulmonary vein was small.  3. Successful electrical isolation and anatomical encircling of all four pulmonary veins with radiofrequency current.    4. Cavo-tricuspid isthmus ablation was performed with complete bidirectional isthmus block achieved.  5. No inducible arrhythmias following ablation both on and off of Isuprel 6. No early apparent complications.  Patient Profile     31 y.o. male w/PMHx ofHTN, morbid obesity, pancreatitis, AFib admitted for Tikosyn  Assessment & Plan    Persistent AFib CHA2DS2Vasc is zero, on Eliquis, appropriately dosed Tikosyn load is in progress K+ 4.2 Mag 2.0 Creat 0.81, stable QTc stable  In/out of Afib  Velna Hedgecock save his DCCV spot/NPO after MN Pt aware and agreeable   HTN Home meds  For questions or updates, please contact [place] Please consult [email or web address] for contact info under        Signed, Baldwin Jamaica, PA-C  07/07/2022, 7:00 AM    I have seen and examined this patient with Tommye Standard.  Agree with above, note added to reflect my findings.  Patient admitted for cellulitis load.  No complaints at this time.  GEN: Well nourished, well developed, in no acute distress  HEENT: normal  Neck: no JVD, carotid bruits, or masses Cardiac: RRR; no murmurs, rubs, or gallops,no edema  Respiratory:  clear to auscultation  bilaterally, normal work of breathing GI: soft, nontender, nondistended, + BS MS: no deformity or atrophy  Skin: warm and dry Neuro:  Strength and sensation are intact Psych: euthymic mood, full affect   Persistent atrial fibrillation: CHA2DS2-VASc of 0.  Currently on Eliquis 5 mg twice daily.  Dofetilide load in progress.  QTc remained stable.  Magnesium and potassium at normal levels.  No changes. Pretension: Continue home medications  Weylyn Ricciuti M. Cherita Hebel MD 07/07/2022 8:55 AM

## 2022-07-07 NOTE — Progress Notes (Signed)
Post dose EKG reviewed. QTc remains stable Continue Tikosyn  load Francis Dowse, PA-C

## 2022-07-07 NOTE — Care Management (Signed)
  Transition of Care [facility]) Screening Note   Patient Details  Name: Nathaniel [name] Date of Birth: 04-23-1991   Transition of [facility]) CM/SW Contact:    Gala Lewandowsky, RN Phone Number: 07/07/2022, 3:00 PM    Transition of Care Department [facility]) has reviewed the patient and no TOC needs have been identified at this time. Benefits check completed for Tikosyn and PA needed. Case Manager will speak with patient regarding cost and pharmacy of choice. Case Manager will continue to monitor patient advancement through interdisciplinary progression rounds. If new patient transition needs arise, please place a TOC consult.

## 2022-07-07 NOTE — Telephone Encounter (Signed)
Patient Advocate Encounter   Received notification that prior authorization for Dofetilide capsules is required.   PA submitted on 07/07/2022 Key [id number] Status is pending       Roland Earl, CPhT Pharmacy Patient Advocate Specialist [facility] Patient Advocate Team Direct Number: [phone]  Fax: [phone]

## 2022-07-08 ENCOUNTER — Encounter (HOSPITAL_COMMUNITY): Payer: Self-pay | Admitting: Anesthesiology

## 2022-07-08 ENCOUNTER — Encounter (HOSPITAL_COMMUNITY)
Admission: AD | Disposition: A | Discharge status: Home / Self Care | Payer: Self-pay | Source: Ambulatory Visit | Attending: Physician Assistant

## 2022-07-08 ENCOUNTER — Other Ambulatory Visit (HOSPITAL_COMMUNITY): Payer: Self-pay

## 2022-07-08 DIAGNOSIS — I480 Paroxysmal atrial fibrillation: Secondary | ICD-10-CM | POA: Diagnosis not present

## 2022-07-08 LAB — BASIC METABOLIC PANEL
Anion gap: 7 (ref 5–15)
BUN: 14 mg/dL (ref 6–20)
CO2: 25 mmol/L (ref 22–32)
Calcium: 9 mg/dL (ref 8.9–10.3)
Chloride: 107 mmol/L (ref 98–111)
Creatinine, Ser: 0.87 mg/dL (ref 0.61–1.24)
GFR, Estimated: >60 mL/min (ref >60)
Glucose, Bld: 95 mg/dL (ref 70–99)
Potassium: 4.1 mmol/L (ref 3.5–5.1)
Sodium: 139 mmol/L (ref 135–145)

## 2022-07-08 LAB — MAGNESIUM: Magnesium: 2.3 mg/dL (ref 1.7–2.4)

## 2022-07-08 SURGERY — CANCELLED PROCEDURE

## 2022-07-08 NOTE — Care Management (Signed)
1314 07-08-22 Case Manager spoke with patient and he feels that the Dofetilide co pay is expensive. Patient wants his initial Rx filled via [facility] and Rx refills 90 day supply electronically submitted to [employer]. Rx must include the patients email address. No further needs identified at this time.

## 2022-07-08 NOTE — Progress Notes (Addendum)
Pharmacy: Dofetilide (Tikosyn) - Follow Up Assessment and Electrolyte Replacement  Pharmacy consulted to assist in monitoring and replacing electrolytes in this 31 y.o. male admitted on 07/06/2022 undergoing dofetilide initiation.   Labs:    Component Value Date/Time   K 4.1 07/08/2022 0237   MG 2.3 07/08/2022 0237     Plan: Potassium: K >/= 4: No additional supplementation needed  Magnesium: Mg > 2: No additional supplementation needed  Prior authorization is approved, copay is $53.51.   Thank you for allowing pharmacy to participate in this patient's care   Sheppard Coil PharmD., BCPS Clinical Pharmacist 07/08/2022 7:38 AM

## 2022-07-08 NOTE — Progress Notes (Signed)
Morning EKG reviewed     Shows  back in AF  at 83 bpm with stable QTc at ~420-440 ms.  Continue  Tikosyn 500 mcg BID.   Potassium4.1 (11/09 0237) Magnesium  2.3 (11/09 0237) Creatinine, ser  0.87 (11/09 0237)  Plan for home Friday if QTc remains stable, with close outpatient follow up to further evaluate response and plan.    Graciella Freer, PA-C  07/08/2022 12:00 PM

## 2022-07-08 NOTE — Progress Notes (Addendum)
Electrophysiology Rounding Note  Patient Name: Nathaniel [name] Date of Encounter: 07/08/2022  Primary Cardiologist: None  Electrophysiologist: Nathaniel Prude, MD    Subjective   Pt  is in NSR this am on Tikosyn 250 mcg BID. He has continued to have intermittent paroxysmal of AF  QTc from EKG last pm shows  borderline QT  at 490, likely less. No dose change for now  The patient is doing well today.  At this time, the patient denies chest pain, shortness of breath, or any new concerns.  Inpatient Medications    Scheduled Meds:  apixaban  5 mg Oral BID   clonazePAM  0.5 mg Oral BID   diltiazem  240 mg Oral Daily   dofetilide  500 mcg Oral BID   multivitamin with minerals  1 tablet Oral Daily   pantoprazole  40 mg Oral Daily   sertraline  12.5 mg Oral Daily   sodium chloride flush  3 mL Intravenous Q12H   cyanocobalamin  500 mcg Oral Daily   Continuous Infusions:  sodium chloride     sodium chloride     PRN Meds: sodium chloride, acetaminophen, diltiazem, sodium chloride flush   Vital Signs    Vitals:   07/07/22 0824 07/07/22 1450 07/07/22 2150 07/08/22 0430  BP: 113/74 104/74 (!) 131/98 120/79  Pulse:   87 82  Resp:  17 17 17   Temp:  97.8 F (36.6 C) 97.8 F (36.6 C) 97.7 F (36.5 C)  TempSrc:  Oral Oral Oral  SpO2:  100% 98% 95%  Weight:      Height:        Intake/Output Summary (Last 24 hours) at 07/08/2022 0728 Last data filed at 07/07/2022 13/03/2022 Gross per 24 hour  Intake 0 ml  Output --  Net 0 ml   Filed Weights   07/06/22 1337 07/06/22 1526  Weight: (!) 143.3 kg (!) 141.9 kg    Physical Exam    GEN- The patient is well appearing, alert and oriented x 3 today.   Head- normocephalic, atraumatic Eyes-  Sclera clear, conjunctiva pink Ears- hearing intact Oropharynx- clear Neck- supple Lungs- Clear to ausculation bilaterally, normal work of breathing Heart- Regular rate and rhythm, no murmurs, rubs or gallops GI- soft, NT, ND, +  BS Extremities- no clubbing, cyanosis, or edema Skin- no rash or lesion Psych- euthymic mood, full affect Neuro- strength and sensation are intact  Labs    CBC No results for input(s): "WBC", "NEUTROABS", "HGB", "HCT", "MCV", "PLT" in the last 72 hours. Basic Metabolic Panel Recent Labs    13/07/23 0302 07/08/22 0237  NA 139 139  K 4.2 4.1  CL 108 107  CO2 24 25  GLUCOSE 92 95  BUN 13 14  CREATININE 0.81 0.87  CALCIUM 8.8* 9.0  MG 2.0 2.3    Potassium  Date/Time Value Ref Range Status  07/08/2022 02:37 AM 4.1 3.5 - 5.1 mmol/L Final   Magnesium  Date/Time Value Ref Range Status  07/08/2022 02:37 AM 2.3 1.7 - 2.4 mg/dL Final    Comment:    Performed at [facility], [address] [place], [place]    Telemetry    NSR 70s this am, continued to have paroxysms of AF overnight (personally reviewed)  Radiology    No results found.   Patient Profile     Nathaniel [name] is a 31 y.o. male with a past medical history significant for persistent atrial fibrillation.  They were admitted for  tikosyn load.   Assessment & Plan    Persistent atrial fibrillation Pt converted to sinus rhythm on Tikosyn 500 mcg BID, but has paroxysms Continue Eliquis Creatinine, ser  0.87 (11/09 0237) Magnesium  2.3 (11/09 0237) Potassium4.1 (11/09 0237) No electrolyte supplementation needed  Plan for home Friday if QTc remains stable.   For questions or updates, please contact [facility] Please consult [email or web address] for contact info under Cardiology/STEMI.  Signed, Nathaniel Freer, PA-C  07/08/2022, 7:28 AM   I have seen and examined this patient with Nathaniel [name].  Agree with above, note added to reflect my findings.  Patient continues to go in and out of atrial fibrillation despite dofetilide.  QTc is remained stable.  Feeling well without complaint.  GEN: Well nourished, well developed, in no acute distress  HEENT: normal  Neck: no JVD, carotid  bruits, or masses Cardiac: RRR; no murmurs, rubs, or gallops,no edema  Respiratory:  clear to auscultation bilaterally, normal work of breathing GI: soft, nontender, nondistended, + BS MS: no deformity or atrophy  Skin: warm and dry Neuro:  Strength and sensation are intact Psych: euthymic mood, full affect   Persistent atrial fibrillation: Currently in sinus rhythm.  Has had paroxysms of atrial fibrillation.  Continue Eliquis.  QTc is remained stable.  We Nathaniel [name] likely continue his dofetilide load and plan for discharge.  Would likely be discharged on dofetilide even if he remains paroxysmal.  Nathaniel [name] M. Nathaniel Burkhead MD 07/08/2022 7:44 AM

## 2022-07-08 NOTE — Telephone Encounter (Signed)
Patient Advocate Encounter  Prior Authorization for Dofetilide capsules  has been approved.    Effective dates: 07/07/2022 through 07/08/2023  Patients co-pay is $53.51.     Roland Earl, CPhT Pharmacy Patient Advocate Specialist [facility] Patient Advocate Team Direct Number: [phone]  Fax: [phone]

## 2022-07-09 ENCOUNTER — Other Ambulatory Visit (HOSPITAL_COMMUNITY): Payer: Self-pay

## 2022-07-09 LAB — BASIC METABOLIC PANEL
Anion gap: 8 (ref 5–15)
BUN: 15 mg/dL (ref 6–20)
CO2: 23 mmol/L (ref 22–32)
Calcium: 9 mg/dL (ref 8.9–10.3)
Chloride: 107 mmol/L (ref 98–111)
Creatinine, Ser: 0.88 mg/dL (ref 0.61–1.24)
GFR, Estimated: >60 mL/min (ref >60)
Glucose, Bld: 86 mg/dL (ref 70–99)
Potassium: 3.9 mmol/L (ref 3.5–5.1)
Sodium: 138 mmol/L (ref 135–145)

## 2022-07-09 LAB — MAGNESIUM: Magnesium: 2 mg/dL (ref 1.7–2.4)

## 2022-07-09 MED ORDER — DOFETILIDE 500 MCG PO CAPS
500.0000 ug | ORAL_CAPSULE | Freq: Two times a day (BID) | ORAL | 11 refills | Status: DC
  Filled 2022-07-09: qty 30, 15d supply, fill #0

## 2022-07-09 MED ORDER — POTASSIUM CHLORIDE CRYS ER 20 MEQ PO TBCR
40.0000 meq | EXTENDED_RELEASE_TABLET | Freq: Once | ORAL | Status: AC
  Administered 2022-07-09: 40 meq via ORAL
  Filled 2022-07-09: qty 2

## 2022-07-09 MED ORDER — MAGNESIUM SULFATE 2 GM/50ML IV SOLN
2.0000 g | Freq: Once | INTRAVENOUS | Status: AC
  Administered 2022-07-09: 2 g via INTRAVENOUS
  Filled 2022-07-09: qty 50

## 2022-07-09 NOTE — Progress Notes (Addendum)
Pt ambulated to the BR independently, HR increased to 150's sustaining, strip saved. Pt stated that he "felt dizzy" with chest palpitations. 12-lead EKG obtained, pt resting in bed comfortably, with symptoms resolved, resting HR in the 60's. Rosita Fire, MD paged. Pt advised to utilize call bell & to have staff present prior to ambulating.   Bari Edward, RN

## 2022-07-09 NOTE — Progress Notes (Signed)
EKG from yesterday evening 07/08/2022 reviewed     Shows pt remains in afib at 87 bpm with stable QT at ; QTc at 458 ms.  Continue  Tikosyn 500 mcg BID.   Potassium3.9 (11/10 0144) Magnesium  2.0 (11/10 0144) Creatinine, ser  0.88 (11/10 0144)  Plan for home Friday if QTc remains stable   Sherie Don, NP  07/09/2022 7:58 AM

## 2022-07-09 NOTE — Discharge Summary (Signed)
ELECTROPHYSIOLOGY PROCEDURE DISCHARGE SUMMARY    Patient ID: Nathaniel [name],  MRN: [id number], DOB/AGE: 09/30/1990 31 y.o.  Admit date: 07/06/2022 Discharge date: 07/09/2022  Primary Care Physician: Toma Deiters, MD  Primary Cardiologist: None  Electrophysiologist: Dr. Lalla Brothers   Primary Discharge Diagnosis:  1.  Paroxysmal atrial fibrillation status post Tikosyn loading this admission  Secondary Discharge Diagnosis:  none  Allergies  Allergen Reactions   Morphine Nausea And Vomiting     Procedures This Admission:  1.  Tikosyn loading    Brief HPI: Nathaniel [name] is a 31 y.o. male with a past medical history as noted above.  They were referred to EP for treatment options of atrial fibrillation.  Risks, benefits, and alternatives to Tikosyn were reviewed with the patient who wished to proceed with admission for loading.  Hospital Course:  The patient was admitted and Tikosyn was initiated.  Renal function and electrolytes were followed during the hospitalization.  Their QTc remained stable.  He continued to intermittently convert from NSR to AF and back again. He was not cardioverted due to this . The patients QTc remained stable. They were monitored on telemetry up to discharge. On the day of discharge, they were examined by Dr. Nelly Laurence  who considered them stable for discharge to home.  Follow-up has been arranged with the Atrial Fibrillation clinic in approximately 1 week.   Physical Exam: Vitals:   07/08/22 0430 07/08/22 0829 07/08/22 1500 07/08/22 2018  BP: 120/79 (!) 122/94 132/83 103/66  Pulse: 82  60 67  Resp: 17  17 18   Temp: 97.7 F (36.5 C)  97.8 F (36.6 C) 98.6 F (37 C)  TempSrc: Oral   Oral  SpO2: 95%  94% 96%  Weight:      Height:        GEN- The patient is well appearing, alert and oriented x 3 today.   HEENT: normocephalic, atraumatic; sclera clear, conjunctiva pink; hearing intact; oropharynx clear; neck supple, no JVP Lymph- no  cervical lymphadenopathy Lungs- Clear to ausculation bilaterally, normal work of breathing.  No wheezes, rales, rhonchi Heart- Regular rate and rhythm, no murmurs, rubs or gallops, PMI not laterally displaced GI- soft, obese, non-tender, bowel sounds present, no hepatosplenomegaly Extremities- no clubbing, cyanosis, or edema; DP/PT/radial pulses 2+ bilaterally MS- no significant deformity or atrophy Skin- warm and dry, no rash or lesion Psych- euthymic mood, full affect Neuro- strength and sensation are intact  Labs:   Lab Results  Component Value Date   WBC 6.4 11/06/2019   HGB 14.0 11/06/2019   HCT 42.8 11/06/2019   MCV 91.5 11/06/2019   PLT 274 11/06/2019    Recent Labs  Lab 07/09/22 0144  NA 138  K 3.9  CL 107  CO2 23  BUN 15  CREATININE 0.88  CALCIUM 9.0  GLUCOSE 86    Discharge Medications:  Allergies as of 07/09/2022       Reactions   Morphine Nausea And Vomiting        Medication List     TAKE these medications    acetaminophen 325 MG tablet Commonly known as: TYLENOL Take 650 mg by mouth as needed.   clonazePAM 0.5 MG tablet Commonly known as: KLONOPIN Take 1 tablet (0.5 mg total) by mouth 2 (two) times daily.   cyanocobalamin 500 MCG tablet Commonly known as: VITAMIN B12 Take 500 mcg by mouth daily.   diltiazem 240 MG 24 hr capsule Commonly known as: CARDIZEM CD TAKE ONE CAPSULE BY  MOUTH EVERY DAY   diltiazem 30 MG tablet Commonly known as: Cardizem Take 1 tablet every 4 hours AS NEEDED for heart rate >100   dofetilide 500 MCG capsule Commonly known as: TIKOSYN Take 1 capsule (500 mcg total) by mouth 2 (two) times daily.   Eliquis 5 MG Tabs tablet Generic drug: apixaban TAKE 1 TABLET BY MOUTH TWICE DAILY What changed: how much to take   ONE-A-DAY MENS PO Take 1 tablet by mouth every morning.   pantoprazole 40 MG tablet Commonly known as: PROTONIX TAKE 1 TABLET(40 MG) BY MOUTH DAILY What changed: See the new instructions.    sertraline 25 MG tablet Commonly known as: Zoloft Take half a tablet (12.5mg ) once daily.   triamcinolone cream 0.1 % Commonly known as: KENALOG Apply 1 application topically daily as needed (for irritation).        Disposition:    Follow-up Information     Fenton, Clint R, PA Follow up.   Specialty: Cardiology Why: on 11/16 at 3 pm for post hospital tikosyn check Contact information: [address] [place] [address] [phone]                 Duration of Discharge Encounter: Greater than 30 minutes including physician time.  Signed, Sherie Don, NP  07/09/2022 10:48 AM

## 2022-07-09 NOTE — Progress Notes (Signed)
Pharmacy: Dofetilide (Tikosyn) - Follow Up Assessment and Electrolyte Replacement  Pharmacy consulted to assist in monitoring and replacing electrolytes in this 31 y.o. male admitted on 07/06/2022 undergoing dofetilide initiation.   Labs:    Component Value Date/Time   K 3.9 07/09/2022 0144   MG 2.0 07/09/2022 0144     Plan: Potassium: K 3.8-3.9:  Give KCl 40 mEq po x1   Magnesium: Mg 1.8-2: Give Mg 2 gm IV x1   Prior authorization is approved, copay is $53.51.   Thank you for allowing pharmacy to participate in this patient's care   Fredonia Highland, PharmD, BCPS, [facility] Clinical Pharmacist [phone] Please check AMION for all [facility] Pharmacy numbers 07/09/2022

## 2022-07-12 ENCOUNTER — Telehealth (INDEPENDENT_AMBULATORY_CARE_PROVIDER_SITE_OTHER): Payer: 59 | Admitting: Psychiatry

## 2022-07-12 ENCOUNTER — Encounter (HOSPITAL_COMMUNITY): Payer: Self-pay | Admitting: Psychiatry

## 2022-07-12 DIAGNOSIS — I480 Paroxysmal atrial fibrillation: Secondary | ICD-10-CM | POA: Diagnosis not present

## 2022-07-12 DIAGNOSIS — F411 Generalized anxiety disorder: Secondary | ICD-10-CM

## 2022-07-12 DIAGNOSIS — Z79899 Other long term (current) drug therapy: Secondary | ICD-10-CM

## 2022-07-12 DIAGNOSIS — G4726 Circadian rhythm sleep disorder, shift work type: Secondary | ICD-10-CM | POA: Diagnosis not present

## 2022-07-12 DIAGNOSIS — F410 Panic disorder [episodic paroxysmal anxiety] without agoraphobia: Secondary | ICD-10-CM

## 2022-07-12 DIAGNOSIS — F17200 Nicotine dependence, unspecified, uncomplicated: Secondary | ICD-10-CM

## 2022-07-12 MED ORDER — NICOTINE 21 MG/24HR TD PT24: 21.0000 mg | MEDICATED_PATCH | Freq: Every day | TRANSDERMAL | 0 refills | Status: DC

## 2022-07-12 MED ORDER — CLONAZEPAM 0.5 MG PO TABS: 0.2500 mg | ORAL_TABLET | Freq: Two times a day (BID) | ORAL | 0 refills | Status: AC

## 2022-07-12 MED ORDER — SERTRALINE HCL 25 MG PO TABS: ORAL_TABLET | ORAL | 0 refills | Status: DC

## 2022-07-12 NOTE — Patient Instructions (Signed)
We kept your zoloft at 25mg  today but at your next refill vs 2 week check in we may increase to 1.5 tablets (37.5mg ) if you are still tolerating this well. I also called in nicotine patches ( strength). You can wear these all day but be sure to remove before bed as they can cause nightmares. We also decreased your klonopin to 0.25mg  (half a tablet) twice daily in our slow taper. We may start gabapentin next month to replace the klonopin.

## 2022-07-12 NOTE — Progress Notes (Signed)
BH MD Outpatient Progress Note  07/12/2022 4:39 PM Nathaniel [name]  MRN:  [id number]  Assessment:  Nathaniel [name] presents for follow-up evaluation. Today, 07/12/22, patient reports failed cardioversion with tikosyn in in-patient setting in between visits. Still having runs of afib at home which have associated worsening of anxiety. However, he is tolerating 25mg  of zoloft well thus far and has had days where no klonopin was needed. Will continue low dose and plan to proceed cautiously with next titration to 37.5mg  in roughly two weeks if still physically tolerating. Zoloft have a very low incidence of afib but a non-zero chance of occurrence, which was discussed again with patient today for medication consent. Will also continue slow taper of clonazepam as outlined in plan below. Plan will be to start gabapentin to replace at next visit. Nicotine patches have been found in randomized control trials to not lead to increased incidence of arrhythmia in cardiac patients; he was amenable to starting NRT as in plan below which should help with his overall anxiety. CBT to start soon. Follow up in 1 month.  Identifying Information: Nathaniel [name] is a 31 y.o. 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 Initial evaluation for anxiety and insomnia on 06/22/22, see that note for full case discussion. 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 There was some overlap between his cardiac symptoms and description of panic. Unfortunately previous trial of lexapro was cut short due to  cardiac sensation increase. Zoloft chosen as next trial due to overall favorable drug-drug interaction profile. Unclear how heavy his past drinking may have been to receive alcohol abuse diagnosis but repeated pancreatitis and cardiomyopathy would suggest prior heavy use; another reason to not use benzodiazepines. Insomnia best characterized as shift work based due to previously working night shift for many years. CBT referral was placed.    Plan:   # GAD with panic attacks  Long term current use of benzodiazepines Past medication trials: clonazepam, lexapro Status of problem: chronic and stable Interventions: -- continue zoloft 25mg  once daily (s10/24/23, i11/13/23) with plan to titrate to 37.5mg  at next refill -- decrease clonazepam to 0.25mg  twice daily (patient has been taking scheduled as opposed to PRN as previously written for many years) (d10/24/23, d11/13/23) -- referral for CBT   # Shift work insomnia  snoring Past medication trials:  Status of problem: chronic and stable Interventions: -- reviewed basic sleep hygiene, patient may be getting another sleep study   # Tobacco use disorder: dip Past medication trials:  Status of problem: chronic and stable Interventions: - tSocial research o iSocial research o icer, governmentn counseling provided -- start nicotine <MEASUREMENTSocial research officer, governmentdaily   # Afib  HTN  Dilated cardiomyopathy  systolic CHF Past medication trials:  Status of problem: not improving as expected Interventions: -- continue eliquis 5mg  tab per cardiology -- continue diltiazem 240mg  24h capsule once daily with 30mg  prn per cardiology -- continue dofetilide 500mc edication trials:  Status of problem: in remission Interventions: -- continue to encourage abstinence  Patient was given contact information for behavioral health clinic and was instructed  to call 911 for emergencies.    Subjective:  Chief Complaint:  Chief Complaint  Patient presents with   Anxiety   Follow-up   Insomnia    Interval History: Doing ok today, holding steady. Had the 3 day hospitalization for anti-arrhythmic but didn't go as well as hoped. In the hospital was able to see how often he was going into afib which was more than he was aware of. At home will still go into runs of afib so isn't any more relaxing. Has made it to the full 25mg  dose today, and this is the 2 week mark. Had several days where he was able to not take klonopin at all.   Visit Diagnosis:    ICD-10-CM   1. Generalized anxiety disorder with panic attacks  F41.1 clonazePAM (KLONOPIN) 0.5 MG tablet   F41.0 sertraline (ZOLOFT) 25 MG tablet    2. Long-term current use of benzodiazepine  Z79.899 clonazePAM (KLONOPIN) 0.5 MG tablet    3. Shift work sleep disorder  G47.26     4. Paroxysmal atrial fibrillation (HCC)  I48.0     5. 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, and history of cannabis use disorder in sustained remission Medication trials: clonazepam, lexapro (smallest dose effective but triggered afib at higher dose) Previous psychiatrist/therapist: did psychotherapy in [place] who saw predominantly anxiety Hospitalizations: none Suicide attempts: none SIB: none Hx of violence towards others: none Current access to guns: secured in gunsafe Hx of abuse: none Substance use: daily from age 41 to 80   Past Medical History:  Past Medical History:  Diagnosis Date   Acute pancreatitis without infection or necrosis 03/05/2016   Atrial fibrillation (HCC)    Atrial flutter (HCC) 01/17/2016    Admx 5/17 with AFlutter with RVR in setting of pancreatitis // CHADS2-VASc=1 (CHF) // Anticoag not started // Amiodarone >> NSR    Cardiomyopathy (HCC)    CHF (congestive heart failure) (HCC)    Chronic lower back pain    Chronic systolic CHF (congestive heart failure) (HCC)    Echo 01/17/16 - EF 35-40%, diffuse HK, mild LAE    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HCC) 02/03/2016   a. EF 35-40% in setting of AFlutter with RVR in 5/17 // probably tachycardia mediated // needs FU echo 2-3 mos after NSR restored // b. Limited Echo 7/17: EF 45-50%, diff HK, mild LAE   Depression    "last 2-3 months; never treated for it" (12/09/2016)   Fatty liver 12/2015   per CT   Gastroenteritis 06/05/2017   Headache    "monthly" (12/09/2016)   Obesity    BMI 45 12/2015   Pancreatic pseudocyst    Pancreatitis 2012   recurrent acute. Admissions to [facility], 12/2015 admissin to [facility] hospital, ct then shows pseudocyst.    Pancreatitis 2012 X 2; 2017 X 2   Pancreatitis 03/05/2016   Paroxysmal atrial fibrillation (HCC)    Typical atrial flutter (HCC) 06/06/2017    Past Surgical History:  Procedure Laterality Date   ATRIAL FIBRILLATION ABLATION N/A 12/09/2016   Procedure: Atrial Fibrillation Ablation;  Surgeon: 02/08/2017, MD;  Location: [facility] INVASIVE CV LAB;  Service: Cardiovascular;  Laterality: N/A;   ATRIAL FIBRILLATION ABLATION  N/A 11/09/2019   Procedure: ATRIAL FIBRILLATION ABLATION;  Surgeon: Hillis Range, MD;  Location: [facility] INVASIVE CV LAB;  Service: Cardiovascular;  Laterality: N/A;   FRACTURE SURGERY     HAND SURGERY Right ~ 2008   "had pins in it; pulled them"   TEE WITHOUT CARDIOVERSION N/A 12/08/2016   Procedure: TRANSESOPHAGEAL ECHOCARDIOGRAM (TEE);  Surgeon: Jake Bathe, MD;  Location[facility] ENDOSCOPY;  Service: Cardiovascular;  Laterality: N/A;   TONSILLECTOMY AND ADENOIDECTOMY     "in my teens"    Family Psychiatric History: none diagnosed, dad may have anxiety. Mother with depression  Family History:   Family History  Problem Relation Age of Onset   Diabetes Mother    Gallbladder disease Mother    Hypertension Father    Supraventricular tachycardia Brother    Cancer Maternal Grandmother    Cancer Maternal Grandfather    Cirrhosis Maternal Grandfather    Stroke Paternal Grandmother    Celiac disease Neg Hx    Breast cancer Neg Hx    Cholelithiasis Neg Hx    Colon cancer Neg Hx    Colon polyps Neg Hx    Crohn's disease Neg Hx    Esophageal cancer Neg Hx    Hepatitis Neg Hx    Inflammatory bowel disease Neg Hx    Liver disease Neg Hx    Pancreatitis Neg Hx    Stomach cancer Neg Hx    Thyroid disease Neg Hx    Ulcerative colitis Neg Hx     Social History:  Social History   Socioeconomic History   Marital status: Married    Spouse name: Not on file   Number of children: 0   Years of education: 12   Highest education level: Not on file  Occupational History    Employer: [employer]  Tobacco Use   Smoking status: Former    Packs/day: 0.50    Types: Cigarettes   Smokeless tobacco: Current    Types: Snuff   Tobacco comments:    Nicotine patches sent 07/12/22  Vaping Use   Vaping Use: Never used  Substance and Sexual Activity   Alcohol use: Not Currently    Comment: last drink 2018   Drug use: Not Currently    Types: Marijuana    Comment: 12/09/2016 "daily from age 19 to 50"   Sexual activity: Yes    Birth control/protection: None  Other Topics Concern   Not on file  Social History Narrative   Pt lives in [place] with fiancee.  Works at [employer] in [place].   Social Determinants of Health   Financial Resource Strain: Not on file  Food Insecurity: Not on file  Transportation Needs: Not on file  Physical Activity: Not on file  Stress: Not on file  Social Connections: Not on file    Allergies:  Allergies  Allergen Reactions   Morphine Nausea And Vomiting    Current Medications: Current Outpatient Medications  Medication Sig Dispense Refill    clonazePAM (KLONOPIN) 0.5 MG tablet Take 0.5 tablets (0.25 mg total) by mouth 2 (two) times daily. 30 tablet 0   nicotine (NICODERM CQ - DOSED IN MG/24 HOURS) 21 mg/24hr patch Place 1 patch (21 mg total) onto the skin daily. 28 patch 0   acetaminophen (TYLENOL) 325 MG tablet Take 650 mg by mouth as needed.     cyanocobalamin (VITAMIN B12) 500 MCG tablet Take 500 mcg by mouth daily.     diltiazem (CARDIZEM CD) 240 MG 24 hr capsule TAKE ONE  CAPSULE BY MOUTH EVERY DAY (Patient taking differently: Take 240 mg by mouth daily.) 30 capsule 5   diltiazem (CARDIZEM) 30 MG tablet Take 1 tablet every 4 hours AS NEEDED for heart rate >100 30 tablet 1   dofetilide (TIKOSYN) 500 MCG capsule Take 1 capsule (500 mcg total) by mouth 2 (two) times daily. 30 capsule 11   ELIQUIS 5 MG TABS tablet TAKE 1 TABLET BY MOUTH TWICE DAILY (Patient taking differently: Take 5 mg by mouth 2 (two) times daily.) 60 tablet 5   Multiple Vitamin (ONE-A-DAY MENS PO) Take 1 tablet by mouth every morning.     pantoprazole (PROTONIX) 40 MG tablet TAKE 1 TABLET(40 MG) BY MOUTH DAILY (Patient taking differently: Take 40 mg by mouth daily.) 90 tablet 3   sertraline (ZOLOFT) 25 MG tablet Take 1.5 tablets (37.5mg ) once daily. 45 tablet 0   triamcinolone cream (KENALOG) 0.1 % Apply 1 application topically daily as needed (for irritation).      No current facility-administered medications for this visit.    ROS: Review of Systems  Cardiovascular:  Positive for palpitations. Negative for chest pain.  Psychiatric/Behavioral:  Positive for decreased concentration, dysphoric mood and sleep disturbance. Negative for self-injury and suicidal ideas. The patient is nervous/anxious.     Objective:  Psychiatric Specialty Exam: There were no vitals taken for this visit.There is no height or weight on file to calculate BMI.  General Appearance: Casual, Fairly Groomed, and appears older than stated age  Eye Contact:  Good  Speech:  Clear and  Coherent and Normal Rate  Volume:  Normal  Mood:   "ok, holding steady"  Affect:  Appropriate, Congruent, and anxious  Thought Content: Logical and Hallucinations: None   Suicidal Thoughts:  No  Homicidal Thoughts:  No  Thought Process:  Coherent, Goal Directed, and Linear  Orientation:  Full (Time, Place, and Person)    Memory:  Immediate;   Good  Judgment:  Fair  Insight:  Fair  Concentration:  Concentration: Good and Attention Span: Good  Recall:  Good  Fund of Knowledge: Good  Language: Good  Psychomotor Activity:  Normal  Akathisia:  No  AIMS (if indicated): not done  Assets:  Communication Skills Desire for Improvement Financial Resources/Insurance Housing Intimacy Leisure Time Resilience Social Support Talents/Skills Transportation  ADL's:  Intact  Cognition: WNL  Sleep:  Fair   PE: General: sits comfortably in view of camera; no acute distress  Pulm: no increased work of breathing on room air  MSK: all extremity movements appear intact  Neuro: no focal neurological deficits observed  Gait & Station: unable to assess by video    Metabolic Disorder Labs: No results found for: "HGBA1C", "MPG" No results found for: "PROLACTIN" Lab Results  Component Value Date   CHOL 116 05/11/2018   TRIG 61 05/11/2018   HDL 31 (L) 05/11/2018   CHOLHDL 3.7 05/11/2018   VLDL 12 05/11/2018   LDLCALC 73 05/11/2018   LDLCALC 63 03/05/2016   Lab Results  Component Value Date   TSH 2.70 03/04/2016   TSH 1.653 01/18/2016    Therapeutic Level Labs: No results found for: "LITHIUM" No results found for: "VALPROATE" No results found for: "CBMZ"  Screenings:  PHQ2-9    Flowsheet Row [facility]  PHQ-2 Total Score 2  PHQ-9 Total Score 16      Flowsheet Row Office Visit from 06/22/2022 in [facility]  C-SSRS RISK CATEGORY No  Risk        Collaboration of Care: Collaboration of Care: Referral or follow-up with counselor/therapist AEB CBT  Patient/Guardian was advised Release of Information must be obtained prior to any record release in order to collaborate their care with an outside provider. Patient/Guardian was advised if they have not already done so to contact the registration department to sign all necessary forms in order for [place] to release information regarding their care.   Consent: Patient/Guardian gives verbal consent for treatment and assignment of benefits for services provided during this visit. Patient/Guardian expressed understanding and agreed to proceed.   Televisit via video: I connected with Jamaria on 07/12/22 at  3:30 PM EST by a video enabled telemedicine application and verified that I am speaking with the correct person using two identifiers.  Location: Patient: home Provider: home office   I discussed the limitations of evaluation and management by telemedicine and the availability of in person appointments. The patient expressed understanding and agreed to proceed.  I discussed the assessment and treatment plan with the patient. The patient was provided an opportunity to ask questions and all were answered. The patient agreed with the plan and demonstrated an understanding of the instructions.   The patient was advised to call back or seek an in-person evaluation if the symptoms worsen or if the condition fails to improve as anticipated.  I provided 30 minutes of non-face-to-face time during this encounter.  Elsie Lincoln, MD 07/12/2022, 4:39 PM

## 2022-07-15 ENCOUNTER — Encounter (HOSPITAL_COMMUNITY): Payer: 59 | Admitting: Physician Assistant

## 2022-07-16 ENCOUNTER — Ambulatory Visit (HOSPITAL_COMMUNITY)
Admission: RE | Admit: 2022-07-16 | Discharge: 2022-07-16 | Disposition: A | Discharge status: Home / Self Care | Payer: 59 | Source: Ambulatory Visit | Attending: Physician Assistant | Admitting: Physician Assistant

## 2022-07-16 VITALS — BP 120/88 | HR 108 | Ht 72.0 in | Wt 316.2 lb

## 2022-07-16 DIAGNOSIS — Z79899 Other long term (current) drug therapy: Secondary | ICD-10-CM | POA: Diagnosis not present

## 2022-07-16 DIAGNOSIS — Z7901 Long term (current) use of anticoagulants: Secondary | ICD-10-CM | POA: Insufficient documentation

## 2022-07-16 DIAGNOSIS — R002 Palpitations: Secondary | ICD-10-CM | POA: Diagnosis not present

## 2022-07-16 DIAGNOSIS — I484 Atypical atrial flutter: Secondary | ICD-10-CM | POA: Diagnosis present

## 2022-07-16 DIAGNOSIS — I480 Paroxysmal atrial fibrillation: Secondary | ICD-10-CM | POA: Diagnosis not present

## 2022-07-16 LAB — BASIC METABOLIC PANEL
Anion gap: 12 (ref 5–15)
BUN: 10 mg/dL (ref 6–20)
CO2: 25 mmol/L (ref 22–32)
Calcium: 9 mg/dL (ref 8.9–10.3)
Chloride: 105 mmol/L (ref 98–111)
Creatinine, Ser: 0.77 mg/dL (ref 0.61–1.24)
GFR, Estimated: >60 mL/min (ref >60)
Glucose, Bld: 101 mg/dL — ABNORMAL HIGH (ref 70–99)
Potassium: 3.9 mmol/L (ref 3.5–5.1)
Sodium: 142 mmol/L (ref 135–145)

## 2022-07-16 LAB — MAGNESIUM: Magnesium: 2 mg/dL (ref 1.7–2.4)

## 2022-07-16 MED ORDER — DOFETILIDE 500 MCG PO CAPS: 500.0000 ug | ORAL_CAPSULE | Freq: Two times a day (BID) | ORAL | 2 refills | Status: DC

## 2022-07-16 MED ORDER — METOPROLOL SUCCINATE ER 25 MG PO TB24: 25.0000 mg | ORAL_TABLET | Freq: Every day | ORAL | 3 refills | Status: DC

## 2022-07-16 NOTE — Progress Notes (Signed)
Primary Care Physician: Toma Deiters, MD Referring Physician: Dr. Johney Frame Primary EP: Dr Lalla Brothers   Nathaniel [name] is a 31 y.o. male with a h/o paroxysmal afib that is in the afib clinic for increased palpitations. He had a afib ablation in 2018. He has been maintained with flecainide and metoprolol. Is on eliquis 5 mg bid for CHA2DS2VASc score of 2. He has noted increased palpitations since he was hospitalized with acute on chronic  Pancreatitis 10/11/19. [name]  He had a bad episode at work the other night but today is in SR. He also had covid last summer at which time he has increase of afib episodes.  He is interested in redo ablation if needed.   F/u in afib clinic, 11/28/19. He had redo  ablation 3/12 but sent a my chart message that he was having issues with intermittent afib and asked to  be seen. On arrival, he was in SR. Pt states that he is scared to go to work as he gets so apprehensive when he goes into afib that he cant function.. His 30 mg Cardizem tab will usually convert him quickly. He has issues with anxiety and panic attacks. He mostly will feel anxiety and then the afib kicks in. He presented in SR but while talking about his anxiety he went into afib by ausculation. No swallowing or groin issues since the procedure. We discussed today as well, that some afib after the procedure can be expected.   F/u in afib clinic, 11/05/21. He saw Dr. Johney Frame 3 months ago. He states that he still has breakthrough afib. May last a couple of hours. He did get second opinion at [facility] and they discussed doing a Maze procedure and pt  was not ready. Dr. Johney Frame also has mentioned convergent procedure in the past. Continues on flecainide 150 mg bid.  Eliquis 5 mg bid for a CHA2DS2VASc  score of 2.   Follow up in the AF clinic 07/06/22. Patient presents for dofetilide admission. He continues to go in and out of afib frequently. His dofetilide admission was rescheduled for today as he was having abdominal pain  2/2 chronic pancreatitis, now resolved. He denies any missed doses of anticoagulation.   Follow up in the AF clinic 07/16/22. Patient is s/p dofetilide loading 11/7-11/10/23. Unfortunately, during his admission and post discharge he has continued to have very frequent afib episodes.   Today, he denies symptoms of  chest pain, shortness of breath, orthopnea, PND, lower extremity edema, dizziness, presyncope, syncope, or neurologic sequela. The patient is tolerating medications without difficulties and is otherwise without complaint today.   Past Medical History:  Diagnosis Date   Acute pancreatitis without infection or necrosis 03/05/2016   Atrial fibrillation (HCC)    Atrial flutter (HCC) 01/17/2016   Admx 5/17 with AFlutter with RVR in setting of pancreatitis // CHADS2-VASc=1 (CHF) // Anticoag not started // Amiodarone >> NSR    Cardiomyopathy (HCC)    CHF (congestive heart failure) (HCC)    Chronic lower back pain    Chronic systolic CHF (congestive heart failure) (HCC)    Echo 01/17/16 - EF 35-40%, diffuse HK, mild LAE    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HCC) 02/03/2016   a. EF 35-40% in setting of AFlutter with RVR in 5/17 // probably tachycardia mediated // needs FU echo 2-3 mos after NSR restored // b. Limited Echo 7/17: EF 45-50%, diff HK, mild LAE   Depression    "last 2-3 months; never treated for it" (12/09/2016)  Fatty liver 12/2015   per CT   Gastroenteritis 06/05/2017   Headache    "monthly" (12/09/2016)   Obesity    BMI 45 12/2015   Pancreatic pseudocyst    Pancreatitis 2012   recurrent acute. [facility]missions [facility], 12/2015 admissin to [facility] hospital, ct then shows pseudocyst.    Pancreatitis 2012 X 2; 2017 X 2   Pancreatitis 03/05/2016   Paroxysmal atrial fibrillation (HCC)    Typical atrial flutter (HCC) 06/06/2017   Past Surgical History:  Procedure Laterality Date   ATRIAL FIBRILLATION ABLATION N/A 12/09/2016   Procedure: Atrial Fibrillation Ablation;  Surgeon: Hillis Range, MD;   Location: [facility] INVASIVE CV LAB;  Service: Cardiovascular;  Laterality: N/A;   ATRIAL FIBRILLATION ABLATION N/A 11/09/2019   Procedure: ATRIAL FIBRILLATION ABLATION;  Surgeon: Hillis Range, MD;  Location: [facility] INVASIVE CV LAB;  Service: Cardiovascular;  Laterality: N/A;   FRACTURE SURGERY     HAND SURGERY Right ~ 2008   "had pins in it; pulled them"   TEE WITHOUT CARDIOVERSION N/A 12/08/2016   Procedure: TRANSESOPHAGEAL ECHOCARDIOGRAM (TEE);  Surgeon: Jake Bathe, MD;  Location: [facility] ENDOSCOPY;  Service: Cardiovascular;  Laterality: N/A;   TONSILLECTOMY AND ADENOIDECTOMY     "in my teens"    Current Outpatient Medications  Medication Sig Dispense Refill   acetaminophen (TYLENOL) 325 MG tablet Take 650 mg by mouth as needed.     clonazePAM (KLONOPIN) 0.5 MG tablet Take 0.5 tablets (0.25 mg total) by mouth 2 (two) times daily. 30 tablet 0   cyanocobalamin (VITAMIN B12) 500 MCG tablet Take 500 mcg by mouth daily.     diltiazem (CARDIZEM CD) 240 MG 24 hr capsule TAKE ONE CAPSULE BY MOUTH EVERY DAY (Patient taking differently: Take 240 mg by mouth daily.) 30 capsule 5   diltiazem (CARDIZEM) 30 MG tablet Take 1 tablet every 4 hours AS NEEDED for heart rate >100 30 tablet 1   dofetilide (TIKOSYN) 500 MCG capsule Take 1 capsule (500 mcg total) by mouth 2 (two) times daily. 30 capsule 11   ELIQUIS 5 MG TABS tablet TAKE 1 TABLET BY MOUTH TWICE DAILY (Patient taking differently: Take 5 mg by mouth 2 (two) times daily.) 60 tablet 5   Multiple Vitamin (ONE-A-DAY MENS PO) Take 1 tablet by mouth every morning.     pantoprazole (PROTONIX) 40 MG tablet TAKE 1 TABLET(40 MG) BY MOUTH DAILY (Patient taking differently: Take 40 mg by mouth daily.) 90 tablet 3   sertraline (ZOLOFT) 25 MG tablet Take 1.5 tablets (37.5mg ) once daily. 45 tablet 0   triamcinolone cream (KENALOG) 0.1 % Apply 1 application topically daily as needed (for irritation).      nicotine (NICODERM CQ - DOSED IN MG/24 HOURS) 21 mg/24hr patch Place 1  patch (21 mg total) onto the skin daily. (Patient not taking: Reported on 07/16/2022) 28 patch 0   No current facility-administered medications for this encounter.    Allergies  Allergen Reactions   Morphine Nausea And Vomiting    Social History   Socioeconomic History   Marital status: Married    Spouse name: Not on file   Number of children: 0   Years of education: 12   Highest education level: Not on file  Occupational History    Employer: [employer]  Tobacco Use   Smoking status: Former    Packs/day: 0.50    Types: Cigarettes   Smokeless tobacco: Current    Types: Snuff   Tobacco comments:    Nicotine patches sent  07/12/22  Vaping Use   Vaping Use: Never used  Substance and Sexual Activity   Alcohol use: Not Currently    Comment: last drink 2018   Drug use: Not Currently    Types: Marijuana    Comment: 12/09/2016 "daily from age 61 to 60"   Sexual activity: Yes    Birth control/protection: None  Other Topics Concern   Not on file  Social History Narrative   Pt lives in [place] with fiancee.  Works at [employer] in [place].   Social Determinants of Health   Financial Resource Strain: Not on file  Food Insecurity: Not on file  Transportation Needs: Not on file  Physical Activity: Not on file  Stress: Not on file  Social Connections: Not on file  Intimate Partner Violence: Not on file    Family History  Problem Relation Age of Onset   Diabetes Mother    Gallbladder disease Mother    Hypertension Father    Supraventricular tachycardia Brother    Cancer Maternal Grandmother    Cancer Maternal Grandfather    Cirrhosis Maternal Grandfather    Stroke Paternal Grandmother    Celiac disease Neg Hx    Breast cancer Neg Hx    Cholelithiasis Neg Hx    Colon cancer Neg Hx    Colon polyps Neg Hx    Crohn's disease Neg Hx    Esophageal cancer Neg Hx    Hepatitis Neg Hx    Inflammatory bowel disease Neg Hx    Liver disease Neg Hx    Pancreatitis  Neg Hx    Stomach cancer Neg Hx    Thyroid disease Neg Hx    Ulcerative colitis Neg Hx     ROS- All systems are reviewed and negative except as per the HPI above  Physical Exam: Vitals:   07/16/22 1144  BP: 120/88  Pulse: (!) 108  Weight: (!) 143.4 kg  Height: 6' (1.829 m)    Wt Readings from Last 3 Encounters:  07/16/22 (!) 143.4 kg  07/06/22 (!) 141.9 kg  07/06/22 (!) 138.8 kg    Labs: Lab Results  Component Value Date   NA 138 07/09/2022   K 3.9 07/09/2022   CL 107 07/09/2022   CO2 23 07/09/2022   GLUCOSE 86 07/09/2022   BUN 15 07/09/2022   CREATININE 0.88 07/09/2022   CALCIUM 9.0 07/09/2022   PHOS 3.8 05/09/2018   MG 2.0 07/09/2022   Lab Results  Component Value Date   INR 1.22 01/18/2016   Lab Results  Component Value Date   CHOL 116 05/11/2018   HDL 31 (L) 05/11/2018   LDLCALC 73 05/11/2018   TRIG 61 05/11/2018    GEN- The patient is a well appearing obese male, alert and oriented x 3 today.   HEENT-head normocephalic, atraumatic, sclera clear, conjunctiva pink, hearing intact, trachea midline. Lungs- Clear to ausculation bilaterally, normal work of breathing Heart- irregular rate and rhythm, no murmurs, rubs or gallops  GI- soft, NT, ND, + BS Extremities- no clubbing, cyanosis, or edema MS- no significant deformity or atrophy Skin- no rash or lesion Psych- euthymic mood, full affect Neuro- strength and sensation are intact   EKG-  Atypical atrial flutter converting to SR Vent. rate 108 BPM PR interval * ms QRS duration 86 ms QT/QTcB 298/399 ms   Assessment and Plan: 1. Persistent Afib/atrial flutter S/p ablation in 2018 and redo 2021 S/p dofetilide admission 11/7-11/10/23 Unfortunately, patient is still have very frequent afib. We discussed hybrid  ablation today and he is agreeable to consultation with Dr Delia Chimes. Also d/w Dr Lalla Brothers. Will resume Toprol 25 mg daily Continue dofetilide 500 mcg BID. QT stable when converted to SR. Continue  Eliquis 5 mg BID for CHA2DS2VASc of 2  Check bmet/mag today. Continue diltiazem 240 mg daily  2. HTN Stable, med changes as above.   3. Obesity Body mass index is 42.88 kg/m. Lifestyle modification was discussed and encouraged including regular physical activity and weight reduction.    Follow up with Dr Lalla Brothers in one month.    Jorja Loa PA-C Afib Clinic [facility] [address] [place], [place] [address] [phone]

## 2022-07-19 ENCOUNTER — Other Ambulatory Visit (HOSPITAL_COMMUNITY): Payer: Self-pay | Admitting: *Deleted

## 2022-07-19 MED ORDER — DOFETILIDE 500 MCG PO CAPS: 500.0000 ug | ORAL_CAPSULE | Freq: Two times a day (BID) | ORAL | 2 refills | Status: DC

## 2022-07-28 ENCOUNTER — Encounter (HOSPITAL_COMMUNITY): Payer: Self-pay

## 2022-08-05 ENCOUNTER — Institutional Professional Consult (permissible substitution) (INDEPENDENT_AMBULATORY_CARE_PROVIDER_SITE_OTHER): Payer: 59 | Admitting: Cardiothoracic Surgery

## 2022-08-05 ENCOUNTER — Other Ambulatory Visit: Payer: Self-pay | Admitting: Cardiothoracic Surgery

## 2022-08-05 VITALS — BP 128/80 | HR 76 | Resp 20 | Ht 72.0 in | Wt 316.0 lb

## 2022-08-05 DIAGNOSIS — I480 Paroxysmal atrial fibrillation: Secondary | ICD-10-CM | POA: Diagnosis not present

## 2022-08-05 NOTE — Progress Notes (Signed)
[address]       [place] [address]             [phone]                    Detrich Mcdaniel [place] Medical Record #[id number] Date of Birth: 02/24/1991  Referring: Oliver Barre, PA Primary Care: Neale Burly, MD Primary Cardiologist: Vickie Epley, MD  Chief Complaint:    Chief Complaint  Patient presents with   Atrial Fibrillation    Surgical consult    History of Present Illness:    Nathaniel [name] 31 y.o. male with a history of persistent AF. Back in 2017 had EF of 35%. Started on amiodarone.   Had a catheter ablation in 2017.  Got 2 years from 1st ablation  Redo catheter ablation 2021 Back in af immediately after 2nd ablation  In and out of AF all the time. Feels a lot worse in AF.  Pancreast divisum. Annuluas panceas.  Can't get to cyst  Gallbladder out in June lst year. Only 1 pancreatitis since then.  Treated with fluids and pain  meds and 2-3 day stay in the past. No stay more than 4 days, not intubation.   Dilauidid works better than morphine.    Past Medical History:  Diagnosis Date   Acute pancreatitis without infection or necrosis 03/05/2016   Atrial fibrillation (HCC)    Atrial flutter ([place]) 01/17/2016   Admx 5/17 with AFlutter with RVR in setting of pancreatitis // CHADS2-VASc=1 (CHF) // Anticoag not started // Amiodarone >> NSR    Cardiomyopathy (HCC)    CHF (congestive heart failure) (HCC)    Chronic lower back pain    Chronic systolic CHF (congestive heart failure) ([place])    Echo 01/17/16 - EF 35-40%, diffuse HK, mild LAE    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[place]) 02/03/2016   a. EF 35-40% in setting of AFlutter with RVR in 5/17 // probably tachycardia mediated // needs FU echo 2-3 mos after NSR restored // b. Limited Echo 7/17: EF 45-50%, diff HK, mild LAE   Depression    "last 2-3 months; never treated for it" (12/09/2016)   Fatty liver 12/2015   per CT   Gastroenteritis 06/05/2017   Headache     "monthly" (12/09/2016)   Obesity    BMI 45 12/2015   Pancreatic pseudocyst    Pancreatitis 2012   recurrent acute. Admissions to [facility], 12/2015 admissin to [facility], ct then shows pseudocyst.    Pancreatitis 2012 X 2; 2017 X 2   Pancreatitis 03/05/2016   Paroxysmal atrial fibrillation (HCC)    Typical atrial flutter ([place]) 06/06/2017    Past Surgical History:  Procedure Laterality Date   ATRIAL FIBRILLATION ABLATION N/A 12/09/2016   Procedure: Atrial Fibrillation Ablation;  Surgeon: Thompson Grayer, MD;  Location: [facility];  Service: Cardiovascular;  Laterality: N/A;   ATRIAL FIBRILLATION ABLATION N/A 11/09/2019   Procedure: ATRIAL FIBRILLATION ABLATION;  Surgeon: Thompson Grayer, MD;  Location: [facility];  Service: Cardiovascular;  Laterality: N/A;   FRACTURE SURGERY     HAND SURGERY Right ~ 2008   "had pins in it; pulled them"   TEE WITHOUT CARDIOVERSION N/A 12/08/2016   Procedure: TRANSESOPHAGEAL ECHOCARDIOGRAM (TEE);  Surgeon: Jerline Pain, MD;  Location: [facility];  Service: Cardiovascular;  Laterality: N/A;   TONSILLECTOMY AND ADENOIDECTOMY     "in my teens"  Family History  Problem Relation Age of Onset   Diabetes Mother    Gallbladder disease Mother    Hypertension Father    Supraventricular tachycardia Brother    Cancer Maternal Grandmother    Cancer Maternal Grandfather    Cirrhosis Maternal Grandfather    Stroke Paternal Grandmother    Celiac disease Neg Hx    Breast cancer Neg Hx    Cholelithiasis Neg Hx    Colon cancer Neg Hx    Colon polyps Neg Hx    Crohn's disease Neg Hx    Esophageal cancer Neg Hx    Hepatitis Neg Hx    Inflammatory bowel disease Neg Hx    Liver disease Neg Hx    Pancreatitis Neg Hx    Stomach cancer Neg Hx    Thyroid disease Neg Hx    Ulcerative colitis Neg Hx      Social History   Tobacco Use  Smoking Status Former   Packs/day: 0.50   Types: Cigarettes  Smokeless Tobacco Current   Types: Snuff  Tobacco  Comments   Nicotine patches sent 07/12/22    Social History   Substance and Sexual Activity  Alcohol Use Not Currently   Comment: last drink 2018     Allergies  Allergen Reactions   Morphine Nausea And Vomiting    Current Outpatient Medications  Medication Sig Dispense Refill   acetaminophen (TYLENOL) 325 MG tablet Take 650 mg by mouth as needed.     clonazePAM (KLONOPIN) 0.5 MG tablet Take 0.5 tablets (0.25 mg total) by mouth 2 (two) times daily. 30 tablet 0   cyanocobalamin (VITAMIN B12) 500 MCG tablet Take 500 mcg by mouth daily.     diltiazem (CARDIZEM CD) 240 MG 24 hr capsule TAKE ONE CAPSULE BY MOUTH EVERY DAY 90 capsule 1   diltiazem (CARDIZEM) 30 MG tablet Take 1 tablet every 4 hours AS NEEDED for heart rate >100 30 tablet 1   dofetilide (TIKOSYN) 500 MCG capsule Take 1 capsule (500 mcg total) by mouth 2 (two) times daily. 30 capsule 2   ELIQUIS 5 MG TABS tablet TAKE 1 TABLET BY MOUTH TWICE DAILY 60 tablet 5   metoprolol succinate (TOPROL XL) 25 MG 24 hr tablet Take 1 tablet (25 mg total) by mouth at bedtime. 30 tablet 3   Multiple Vitamin (ONE-A-DAY MENS PO) Take 1 tablet by mouth every morning.     nicotine (NICODERM CQ - DOSED IN MG/24 HOURS) 21 mg/24hr patch Place 1 patch (21 mg total) onto the skin daily. (Patient not taking: Reported on 07/16/2022) 28 patch 0   pantoprazole (PROTONIX) 40 MG tablet TAKE 1 TABLET(40 MG) BY MOUTH DAILY (Patient taking differently: Take 40 mg by mouth daily.) 90 tablet 3   sertraline (ZOLOFT) 25 MG tablet Take 1.5 tablets (37.5 mg total) by mouth daily. TAKE 1 AND 1/2 TABLETS BY MOUTH EVERY DAY 45 tablet 0   triamcinolone cream (KENALOG) 0.1 % Apply 1 application topically daily as needed (for irritation).      No current facility-administered medications for this visit.    ROS 14 point ROS reviewed and negative except as per HPI   PHYSICAL EXAMINATION: BP 128/80   Pulse 76   Resp 20   Ht 6' (1.829 m)   Wt (!) 316 lb (143.3 kg)    SpO2 97% Comment: RA  BMI 42.86 kg/m   Gen: NAD Neuro: Alert and oriented CV: irreg irreg rhythm Resp: Nonlaboured Abd: Soft, ntnd Extr: [facility]  Diagnostic  Studies & Laboratory data:     Recent Radiology Findings:   ECHOCARDIOGRAM COMPLETE  Result Date: 08/11/2022    ECHOCARDIOGRAM REPORT   Patient Name:   ISAISH ALEMU Date of Exam: 08/11/2022 Medical Rec #:  [id number]         Height:       72.0 in Accession #:    [id number]        Weight:       316.0 lb Date of Birth:  23-Oct-1990          BSA:          2.588 m Patient Age:    56 years          BP:           131/86 mmHg Patient Gender: M                 HR:           70 bpm. Exam Location:  [facility] Procedure: 2D Echo, Cardiac Doppler and Color Doppler Indications:    I48.0 (ICD-10-CM) - Paroxysmal atrial fibrillation  History:        Patient has prior history of Echocardiogram examinations, most                 recent 01/25/2019. Arrythmias:Atrial Flutter and Atrial                 Fibrillation; Risk Factors:Former Smoker and Hypertension.                 Morbid Obesity, Cardiomyopathy Ablation 12/09/16.  Sonographer:    Alvino Chapel RCS Referring Phys: [id number] Naplate  1. Left ventricular ejection fraction, by estimation, is 50 to 55%. The left ventricle has low normal function. The left ventricle has no regional wall motion abnormalities. The left ventricular internal cavity size was mildly dilated. Left ventricular diastolic parameters were normal.  2. Right ventricular systolic function is normal. The right ventricular size is normal. Tricuspid regurgitation signal is inadequate for assessing PA pressure.  3. Left atrial size was upper normal.  4. Right atrial size was mildly dilated.  5. The mitral valve is grossly normal. Trivial mitral valve regurgitation.  6. The aortic valve is tricuspid. Aortic valve regurgitation is not visualized.  7. The inferior vena cava is normal in size with <50% respiratory variability,  suggesting right atrial pressure of 8 mmHg. Comparison(s): Prior images reviewed side by side. LVEF low normal range at 50-55%. Upper normal left atrial chamber size and mildly dilated right atirum. FINDINGS  Left Ventricle: Left ventricular ejection fraction, by estimation, is 50 to 55%. The left ventricle has low normal function. The left ventricle has no regional wall motion abnormalities. The left ventricular internal cavity size was mildly dilated. There is no left ventricular hypertrophy. Left ventricular diastolic parameters were normal. Right Ventricle: The right ventricular size is normal. No increase in right ventricular wall thickness. Right ventricular systolic function is normal. Tricuspid regurgitation signal is inadequate for assessing PA pressure. Left Atrium: Left atrial size was upper normal. Right Atrium: Right atrial size was mildly dilated. Pericardium: There is no evidence of pericardial effusion. Mitral Valve: The mitral valve is grossly normal. Trivial mitral valve regurgitation. Tricuspid Valve: The tricuspid valve is grossly normal. Tricuspid valve regurgitation is trivial. Aortic Valve: The aortic valve is tricuspid. Aortic valve regurgitation is not visualized. Pulmonic Valve: The pulmonic valve was grossly normal. Pulmonic valve regurgitation is trivial. Aorta: The aortic root  is normal in size and structure. Venous: The inferior vena cava is normal in size with less than 50% respiratory variability, suggesting right atrial pressure of 8 mmHg. IAS/Shunts: No atrial level shunt detected by color flow Doppler.  LEFT VENTRICLE PLAX 2D LVIDd:         5.80 cm      Diastology LVIDs:         4.10 cm      LV e' medial:    9.46 cm/s LV PW:         0.90 cm      LV E/e' medial:  8.2 LV IVS:        0.90 cm      LV e' lateral:   13.30 cm/s LVOT diam:     2.40 cm      LV E/e' lateral: 5.8 LV SV:         97 LV SV Index:   38 LVOT Area:     4.52 cm  LV Volumes (MOD) LV vol d, MOD A2C: 170.0 ml LV vol  d, MOD A4C: 181.0 ml LV vol s, MOD A2C: 75.1 ml LV vol s, MOD A4C: 83.2 ml LV SV MOD A2C:     94.9 ml LV SV MOD A4C:     181.0 ml LV SV MOD BP:      96.5 ml RIGHT VENTRICLE RV S prime:     11.70 cm/s TAPSE (M-mode): 2.6 cm LEFT ATRIUM             Index        RIGHT ATRIUM           Index LA diam:        5.00 cm 1.93 cm/m   RA Area:     27.70 cm LA Vol (A2C):   98.2 ml 37.95 ml/m  RA Volume:   101.00 ml 39.03 ml/m LA Vol (A4C):   82.1 ml 31.73 ml/m LA Biplane Vol: 91.4 ml 35.32 ml/m  AORTIC VALVE LVOT Vmax:   90.90 cm/s LVOT Vmean:  64.000 cm/s LVOT VTI:    0.215 m  AORTA Ao Root diam: 3.90 cm MITRAL VALVE MV Area (PHT): 3.12 cm    SHUNTS MV Decel Time: 243 msec    Systemic VTI:  0.22 m MV E velocity: 77.10 cm/s  Systemic Diam: 2.40 cm MV A velocity: 44.20 cm/s MV E/A ratio:  1.74 Rozann Lesches MD Electronically signed by Rozann Lesches MD Signature Date/Time: 08/11/2022/3:50:36 PM    Final        I have independently reviewed the above radiology studies  and reviewed the findings with the patient.   Recent Lab Findings: Lab Results  Component Value Date   WBC 6.4 11/06/2019   HGB 14.0 11/06/2019   HCT 42.8 11/06/2019   PLT 274 11/06/2019   GLUCOSE 101 (H) 07/16/2022   CHOL 116 05/11/2018   TRIG 61 05/11/2018   HDL 31 (L) 05/11/2018   LDLCALC 73 05/11/2018   ALT 19 05/11/2018   AST 17 05/11/2018   NA 142 07/16/2022   K 3.9 07/16/2022   CL 105 07/16/2022   CREATININE 0.77 07/16/2022   BUN 10 07/16/2022   CO2 25 07/16/2022   TSH 2.70 03/04/2016   INR 1.22 01/18/2016   TTE 12/13 EF low normal:   IMPRESSIONS     1. Left ventricular ejection fraction, by estimation, is 50 to 55%. The  left ventricle has low normal function. The left ventricle has no  regional  wall motion abnormalities. The left ventricular internal cavity size was  mildly dilated. Left ventricular  diastolic parameters were normal.   2. Right ventricular systolic function is normal. The right ventricular   size is normal. Tricuspid regurgitation signal is inadequate for assessing  PA pressure.   3. Left atrial size was upper normal.   4. Right atrial size was mildly dilated.   5. The mitral valve is grossly normal. Trivial mitral valve  regurgitation.   6. The aortic valve is tricuspid. Aortic valve regurgitation is not  visualized.   7. The inferior vena cava is normal in size with <50% respiratory  variability, suggesting right atrial pressure of 8 mmHg.   Assessment / Plan:   68M with persistent AF s/p 2 catheter ablations. Feels sig worse in AF.   Most sig other medical issue is hx of pancreatitis -  Pancreast divisum. Annuluas panceas.  Can't get to cyst  Gallbladder out in June lst year. Only 1 pancreatitis since then.  Treated with fluids and pain  meds and 2-3 day stay in the past. No stay more than 4 days, not intubation.   I told him we are getting started with Convergent here and he would be on of the initial patients. I offered him procedure to be later if our experience here if that's what he prefers. It seems that he wants to think about and likely move forward in March. AF is essentially disabling for him, inter-related with anxiety, and making it hard to move forward in life.   Typical workup includes: Coronary eval for patients > 1 yo. - N/A as young patient Echo in the last year - completed 08/11/22     We broadly discussed the rationale for the convergent approach.  Standard recovery is walking around day after procedure, discharge to home postop day 2.  I see convergent (epicardial ablation) patients postoperatively back in the clinic in 1 week and 1 month. Patients generally have a full recovery by 3 weeks.    Risks/benefits/alternatives discussed at length. All questions were asked an answered. Risks are <<1% mortality, 2% morbidity (bleeding, infection, damage to surrounding structures, pancreatic issues and prolonged stay given hx recurrent pancreatitis) and  >97% standard recovery.   Typically I do these procedures on Mondays, last dose of Eliquis is Saturday morning. I restart AC the night of surgery. If they receive an LAA clip, most patients, but not all, come off their AC 2-6 months after surgery at the discretion of their referring cardiologist. Many ultimately have reductions in the degree of antiarrythmic medications as well. Expected outcome is significantly more freedom from Afib (measured by 90% reduction of time in AF).  Discuss moving forward possible Convergent/LAA clip in March 2024   [place] 09/05/2022 12:09 PM

## 2022-08-11 ENCOUNTER — Ambulatory Visit (HOSPITAL_COMMUNITY)
Admission: RE | Admit: 2022-08-11 | Discharge: 2022-08-11 | Disposition: A | Discharge status: Home / Self Care | Payer: 59 | Source: Ambulatory Visit | Attending: Cardiothoracic Surgery | Admitting: Cardiothoracic Surgery

## 2022-08-11 DIAGNOSIS — I480 Paroxysmal atrial fibrillation: Secondary | ICD-10-CM | POA: Insufficient documentation

## 2022-08-11 LAB — ECHOCARDIOGRAM COMPLETE
Area-P 1/2: 3.12 cm2
Calc EF: 54.5 %
S' Lateral: 4.1 cm
Single Plane A2C EF: 55.8 %
Single Plane A4C EF: 54 %

## 2022-08-11 NOTE — Progress Notes (Signed)
*  PRELIMINARY RESULTS* Echocardiogram 2D Echocardiogram has been performed.  Stacey Drain 08/11/2022, 3:16 PM

## 2022-08-12 ENCOUNTER — Encounter (HOSPITAL_COMMUNITY): Payer: Self-pay

## 2022-08-12 ENCOUNTER — Ambulatory Visit (INDEPENDENT_AMBULATORY_CARE_PROVIDER_SITE_OTHER): Payer: 59 | Admitting: Clinical

## 2022-08-12 DIAGNOSIS — F419 Anxiety disorder, unspecified: Secondary | ICD-10-CM

## 2022-08-12 DIAGNOSIS — F331 Major depressive disorder, recurrent, moderate: Secondary | ICD-10-CM

## 2022-08-12 NOTE — Progress Notes (Signed)
Virtual Visit via Video Note  I connected with Nathaniel [name] on 08/12/22 at  2:00 PM EST by a video enabled telemedicine application and verified that I am speaking with the correct person using two identifiers.  Location: Patient: Home Provider: Office   I discussed the limitations of evaluation and management by telemedicine and the availability of in person appointments. The patient expressed understanding and agreed to proceed.      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 No data recorded Referral name: No data recorded Referral phone number: No data recorded  Whom do you see for routine medical problems? No data recorded Practice/Facility Name: No data recorded Practice/Facility Phone Number: No data recorded Name of Contact: No data recorded Contact Number: No data recorded Contact Fax Number: No data recorded Prescriber Name: No data recorded Prescriber Address (if known): No data recorded  What Is the Reason for Your Visit/Call Today? No data recorded How Long Has This Been Causing You Problems? No data recorded What Do You Feel Would Help You the Most Today? No data recorded  Have You Recently Been in Any Inpatient Treatment (Hospital/Detox/Crisis Center/28-Day Program)? No data recorded Name/Location of Program/Hospital:No data recorded How Long Were You There? No data recorded When Were You Discharged? No data recorded  Have You Ever Received Services From [facility] Before? No data recorded Who Do You See at [facility]? No data recorded  Have You Recently Had Any Thoughts About Hurting Yourself? No data recorded Are You Planning to Commit Suicide/Harm Yourself At This time? No data recorded  Have you  Recently Had Thoughts About [place]? No data recorded Explanation: No data recorded  Have You Used Any Alcohol or Drugs in the Past 24 Hours? No data recorded How Long Ago Did You Use Drugs or Alcohol? No data recorded What Did You Use and How Much? No data recorded  Do You Currently Have a Therapist/Psychiatrist? No data recorded Name of Therapist/Psychiatrist: No data recorded  Have You Been Recently Discharged From Any Office Practice or Programs? No data recorded Explanation of Discharge From Practice/Program: No data recorded    CCA Screening Triage Referral Assessment Type of Contact: No data recorded Is this Initial or Reassessment? No data recorded Date Telepsych consult ordered in [facility]:  No data recorded Time Telepsych consult ordered in [facility]:  No data recorded  Patient Reported Information Reviewed? No data recorded Patient Left Without Being Seen? No data recorded Reason for Not Completing Assessment: No data recorded  Collateral Involvement: No data recorded  Does Patient Have a [place]? No data recorded Name and Contact of Legal Guardian: No data recorded If Minor and Not Living with Parent(s), Who has Custody? No data recorded Is CPS involved or ever been involved? No data recorded Is APS involved or ever been involved? No data recorded  Patient Determined To Be At Risk for Harm To Self or Others Based on Review of Patient Reported Information or Presenting Complaint? No data recorded Method: No data recorded Availability of Means: No data recorded Intent: No data recorded Notification Required: No data recorded Additional Information for Danger to Others Potential: No data recorded Additional Comments for Danger to Others Potential: No data recorded Are There Guns or Other Weapons in Your Home? No  data recorded Types of Guns/Weapons: No data recorded Are These Weapons Safely Secured?                            No data  recorded Who Could Verify You Are Able To Have These Secured: No data recorded Do You Have any Outstanding Charges, Pending Court Dates, Parole/Probation? No data recorded Contacted To Inform of Risk of Harm To Self or Others: No data recorded  Location of Assessment: No data recorded  Does Patient Present under Involuntary Commitment? No data recorded IVC Papers Initial File Date: No data recorded  [place] of Residence: No data recorded  Patient Currently Receiving the Following Services: No data recorded  Determination of Need: No data recorded  Options For Referral: No data recorded    CCA Biopsychosocial Intake/Chief Complaint:  The patient was referred from Dr. Nehemiah Settle with prior dx indication of difficulty with sleep and Anxiety.  Current Symptoms/Problems: The patient notes, " I get pretty anxious even with my medicine".   Patient Reported Schizophrenia/Schizoaffective Diagnosis in Past: No   Strengths: The patient notes enjoying working with his hand on outside projects or workling on automobiles.  Preferences: Playing Video Games, Watching TV, keeping house clean.  Abilities: Firearm target shooting, building and cleaning guns.   Type of Services Patient Feels are Needed: The patient is currently receiving Med Management / Individual Therapy   Initial Clinical Notes/Concerns: The patient is currently receiving med management with Dr. Nehemiah Settle. The patient notes no consistent MH counseling prior to the assessment today. No prior hospitalizations for MH . No current S/I or H/I.   Mental Health Symptoms Depression:   Change in energy/activity; Difficulty Concentrating; Fatigue; Sleep (too much or little); Irritability; Increase/decrease in appetite   Duration of Depressive symptoms:  Greater than two weeks   Mania:   None   Anxiety:    Difficulty concentrating; Irritability; Fatigue; Sleep; Restlessness; Tension; Worrying   Psychosis:   None   Duration of  Psychotic symptoms: NA  Trauma:   None (The patient notes being diagnosed with heart failure has hada impact on his MH.)   Obsessions:   None   Compulsions:   None   Inattention:   None   Hyperactivity/Impulsivity:   None   Oppositional/Defiant Behaviors:   None   Emotional Irregularity:   None   Other Mood/Personality Symptoms:  No data recorded   Mental Status Exam Appearance and self-care  Stature:   Average   Weight:   Overweight   Clothing:   Casual   Grooming:   Normal   Cosmetic use:   None   Posture/gait:   Normal   Motor activity:   Not Remarkable   Sensorium  Attention:   Normal   Concentration:   Anxiety interferes   Orientation:   X5   Recall/memory:   Defective in Short-term   Affect and Mood  Affect:   Appropriate   Mood:   Anxious; Depressed   Relating  Eye contact:   Normal   Facial expression:   Responsive   Attitude toward examiner:   Cooperative   Thought and Language  Speech flow:  Normal   Thought content:   Appropriate to Mood and Circumstances   Preoccupation:   None   Hallucinations:   None   Organization:  Logical  Transport planner of Knowledge:   Good   Intelligence:   Average   Abstraction:   Normal  Judgement:   Good   Reality Testing:   Realistic   Insight:   Good   Decision Making:   Normal   Social Functioning  Social Maturity:   Responsible   Social Judgement:   Normal   Stress  Stressors:   Family conflict; Illness; Financial Higher education careers adviser for heart problem.)   Coping Ability:   Normal   Skill Deficits:   None   Supports:   Family (Mom dad brother wife are all supports.)     Religion: Religion/Spirituality Are You A Religious Person?: No How Might This Affect Treatment?: NA  Leisure/Recreation: Leisure / Recreation Do You Have Hobbies?: Yes Leisure and Hobbies: Risk manager  Exercise/Diet: Exercise/Diet Do You Exercise?:  Yes What Type of Exercise Do You Do?: Run/Walk How Many Times a Week Do You Exercise?: 4-5 times a week Have You Gained or Lost A Significant Amount of Weight in the Past Six Months?: No Do You Follow a Special Diet?: No Do You Have Any Trouble Sleeping?: Yes Explanation of Sleeping Difficulties: The patient notes difficulty with getting to sleep as well as difficulty with sleep routine.   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?: No  Education: Education Is Patient Currently Attending School?: No Last Grade Completed: 12 Name of High School: Therapist, occupational High Did Garment/textile technologist From [employer]?: Yes Did Theme park manager?: No Did You Attend Graduate School?: No Did You Have Any Special Interests In School?: NA Did You Have An Individualized Education Program (IIEP): No Did You Have Any Difficulty At School?: No Patient's Education Has Been Impacted by Current Illness: No   CCA Family/Childhood History Family and Relationship History: Family history Marital status: Married Number of Years Married: 5 What types of issues is patient dealing with in the relationship?: None Additional relationship information: No Additional Are you sexually active?: Yes What is your sexual orientation?: Heterosexual Has your sexual activity been affected by drugs, alcohol, medication, or emotional stress?: NA Does patient have children?: No  Childhood History:  Childhood History By whom was/is the patient raised?: Both parents Additional childhood history information: No Additional Description of patient's relationship with caregiver when they were a child: Mom- Good  / Dad- didnt have a close relationship  until the patient was a teenager. Patient's description of current relationship with people who raised him/her: The patient notes he has a close relationship with both parents. How were you disciplined when you got in trouble as a child/adolescent?: Grounding Does patient have siblings?: Yes Number of Siblings: 1 Description of patient's current relationship with siblings: The patient notes he has 1 bio brother and 2 step brothers. The patient notes " My brother i am really close with and i am close to my step brothers as well". Did patient suffer any verbal/emotional/physical/sexual abuse as a child?: No Did patient suffer from severe childhood neglect?: No Has patient ever been sexually abused/assaulted/raped as an adolescent or adult?: No Was the patient ever a victim of a crime or a disaster?: No Witnessed domestic violence?: No Has patient been affected by domestic violence as an adult?: No  Child/Adolescent Assessment:     CCA Substance Use Alcohol/Drug Use: Alcohol / Drug Use Pain Medications: MAR Prescriptions: MAR Over the Counter: None History of alcohol /  drug use?: No history of alcohol / drug abuse Longest period of sobriety (when/how long): NA                         ASAM's:  Six Dimensions of Multidimensional Assessment  Dimension 1:  Acute Intoxication and/or Withdrawal Potential:      Dimension 2:  Biomedical Conditions and Complications:      Dimension 3:  Emotional, Behavioral, or Cognitive Conditions and Complications:     Dimension 4:  Readiness to Change:     Dimension 5:  Relapse, Continued use, or Continued Problem Potential:     Dimension 6:  Recovery/Living Environment:     ASAM Severity Score:    ASAM Recommended Level of Treatment:     Substance use Disorder (SUD)    Recommendations for Services/Supports/Treatments: Recommendations for Services/Supports/Treatments Recommendations For Services/Supports/Treatments: Individual Therapy,  Medication Management  DSM5 Diagnoses: Patient Active Problem List   Diagnosis Date Noted   Tobacco use disorder: dip 07/12/2022   Secondary hypercoagulable state ([place]) 07/06/2022   Long-term current use of benzodiazepine 06/22/2022   Generalized anxiety disorder with panic attacks 06/22/2022   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   Pancreatic divisum 05/11/2018   Acute pancreatitis 05/10/2018   Hypertension 05/10/2018   Acute on chronic pancreatitis ([place]) 08/06/2017   Obesity, Class III, BMI 40-49.9 (morbid obesity) ([place]) 08/06/2017   History of cardiomyopathy 06/05/2017   Hypokalemia 06/05/2017   Hypomagnesemia 06/05/2017   Paroxysmal atrial fibrillation ([place]) 12/09/2016   A-fib ([place]) 12/09/2016   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[place]) 123456   Chronic systolic CHF (congestive heart failure) ([place])    Morbid obesity due to excess calories ([place])    Dyslipidemia    History of pancreatitis 01/17/2016   Snoring 01/17/2016   Former smoker 01/17/2016   Alcohol abuse, 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 Nehemiah Settle.  Patient/Guardian was advised Release of Information must be obtained prior to any record release in order to collaborate their care with an outside provider. Patient/Guardian was advised if they have not already done so to contact the registration department to sign all necessary forms in order for [place] to release information regarding their care.   Consent: Patient/Guardian gives  verbal consent for treatment and assignment of benefits for services provided during this visit. Patient/Guardian expressed understanding and agreed to proceed.    discussed the assessment and treatment plan with the patient. The patient was provided an opportunity to ask questions and all were answered. The patient agreed with the plan and demonstrated an understanding of the instructions.   The patient was advised to call back or seek an in-person evaluation if the symptoms worsen or if the condition fails to improve as anticipated.  I provided 60 minutes of non-face-to-face time during this encounter.  Lennox Grumbles, LCSW  08/12/2022

## 2022-08-15 ENCOUNTER — Other Ambulatory Visit (HOSPITAL_COMMUNITY): Payer: Self-pay | Admitting: Psychiatry

## 2022-08-15 ENCOUNTER — Other Ambulatory Visit: Payer: Self-pay | Admitting: Internal Medicine

## 2022-08-15 ENCOUNTER — Other Ambulatory Visit (HOSPITAL_COMMUNITY): Payer: Self-pay | Admitting: Nurse Practitioner

## 2022-08-15 DIAGNOSIS — F410 Panic disorder [episodic paroxysmal anxiety] without agoraphobia: Secondary | ICD-10-CM

## 2022-08-15 DIAGNOSIS — I480 Paroxysmal atrial fibrillation: Secondary | ICD-10-CM

## 2022-08-15 NOTE — Telephone Encounter (Signed)
Ordered refill of sertraline per request. Please contact him to make a follow up appointment with Dr. Adrian Blackwater.

## 2022-08-17 NOTE — Telephone Encounter (Signed)
Prescription refill request for Eliquis received. Indication:afib Last office visit:9/23 Scr:0.7 Age: 31 Weight:143.3  kg  Prescription refilled

## 2022-08-25 ENCOUNTER — Ambulatory Visit: Payer: 59 | Admitting: Cardiology

## 2022-08-26 ENCOUNTER — Telehealth: Payer: Self-pay | Admitting: *Deleted

## 2022-08-26 NOTE — Telephone Encounter (Signed)
   Pre-operative Risk Assessment    Patient Name: Nathaniel [name]  DOB: 05/09/1991 MRN: [id number]     Request for Surgical Clearance    Procedure:   MULTIPLE FILLINGS & BONE EXTRACTION  Date of Surgery:  Clearance TBD                                 Surgeon:  Tressa Busman Surgeon's Group or Practice Name:  [facility] Phone number:  [phone] Fax number:  [phone]   Type of Clearance Requested:   - Medical  - Pharmacy:  Hold Apixaban (Eliquis) NOT INDICATED   Type of Anesthesia:  Not Indicated   Additional requests/questions:    Wilhemina Cash   08/26/2022, 6:45 AM

## 2022-08-26 NOTE — Telephone Encounter (Signed)
Patient with diagnosis of afib on Eliquis for anticoagulation.    Procedure: MULTIPLE FILLINGS & BONE EXTRACTION   Date of procedure: TBD  CHA2DS2-VASc Score = 2  This indicates a 2.2% annual risk of stroke. The patient's score is based upon: CHF History: 1 HTN History: 1 Diabetes History: 0 Stroke History: 0 Vascular Disease History: 0 Age Score: 0 Gender Score: 0   Loaded on Tikosyn 07/06/22 - 07/09/22. Has f/u EP appt 09/22/22, afib clinic note 11/17 mentions possible hybrid ablation in the future.  CrCl >131mL/min Platelet count 334K  Patient does not require pre-op antibiotics for dental procedure.  Per office protocol, patient can hold Eliquis for 1 day prior to procedure. Ideally will have procedure soon so that he can resume anticoag as he'll need to be on this uninterrupted for 3 weeks prior to potential future ablation (date not scheduled, has EP consult first).  **This guidance is not considered finalized until pre-operative APP has relayed final recommendations.**

## 2022-09-01 NOTE — Telephone Encounter (Signed)
   Name: Jarquis Walker  DOB: 04-28-1991  MRN: [id number]  Primary Cardiologist: Vickie Epley, MD  Chart reviewed as part of pre-operative protocol coverage. Because of Kylee Nardozzi past medical history and time since last visit, he will require a follow-up in-office visit in order to better assess preoperative cardiovascular risk.   Pt was admitted for dofetilide load with breakthrough Afib. He was referred to Drs. Quentin Ore and Enter for consideration of hybrid ablation. We have been asked to hold [facility] for dental bone extraction. We asked Dr. Quentin Ore to weigh in on timing of eliquis hold and ablation as the pt was still having episodes of Afib. Unfortunately, pt canceled his Dec 27th appt with Dr. Quentin Ore.   Since the ablation is not yet scheduled, Dr. Quentin Ore do you agree with moving forward with dental work and eliquis hold now and then rescheduling his in-office appt without at a later date? I understand ablations are scheduling out.       Tami Lin Bethanne Mule, PA  09/01/2022, 8:40 AM

## 2022-09-06 ENCOUNTER — Ambulatory Visit (HOSPITAL_COMMUNITY)
Admission: RE | Admit: 2022-09-06 | Discharge: 2022-09-06 | Disposition: A | Discharge status: Home / Self Care | Payer: 59 | Source: Ambulatory Visit | Attending: Cardiothoracic Surgery | Admitting: Cardiothoracic Surgery

## 2022-09-06 DIAGNOSIS — I480 Paroxysmal atrial fibrillation: Secondary | ICD-10-CM | POA: Diagnosis present

## 2022-09-06 NOTE — Telephone Encounter (Signed)
   Patient Name: Heidi Lemay  DOB: 1990-09-26 MRN: [id number]  Primary Cardiologist: Vickie Epley, MD  Chart reviewed as part of pre-operative protocol coverage. Pre-op clearance already addressed by colleagues in earlier phone notes. To summarize recommendations:  -Sounds reasonable to hold Eliquis for 24 hours.  Can delay his appointment with me as needed.  -CL   Will route this bundled recommendation to requesting provider via Epic fax function and remove from pre-op pool. Please call with questions.  Elgie Collard, PA-C 09/06/2022, 11:11 AM

## 2022-09-09 ENCOUNTER — Telehealth: Payer: Self-pay | Admitting: Cardiology

## 2022-09-09 NOTE — Telephone Encounter (Signed)
Nathaniel [name] is calling from [facility] wanting to check on the status of patient's dental clearance for the extraction.

## 2022-09-10 NOTE — Telephone Encounter (Signed)
I will re-ax clearance notes

## 2022-09-10 NOTE — Telephone Encounter (Signed)
I called the DDS to confirm fax # as it was entered in as the same as the ph#.    The correct fax # is [phone]. I will re-fax notes to correct fax #.

## 2022-09-13 ENCOUNTER — Ambulatory Visit (HOSPITAL_COMMUNITY): Payer: 59 | Admitting: Clinical

## 2022-09-14 ENCOUNTER — Other Ambulatory Visit (HOSPITAL_COMMUNITY): Payer: Self-pay | Admitting: Psychiatry

## 2022-09-14 DIAGNOSIS — F410 Panic disorder [episodic paroxysmal anxiety] without agoraphobia: Secondary | ICD-10-CM

## 2022-09-14 NOTE — Telephone Encounter (Signed)
Will defer to Dr. Nehemiah Settle

## 2022-09-22 ENCOUNTER — Other Ambulatory Visit: Payer: Self-pay | Admitting: *Deleted

## 2022-09-22 ENCOUNTER — Ambulatory Visit: Payer: 59 | Attending: Cardiology | Admitting: Cardiology

## 2022-09-22 ENCOUNTER — Encounter: Payer: Self-pay | Admitting: *Deleted

## 2022-09-22 DIAGNOSIS — I4819 Other persistent atrial fibrillation: Secondary | ICD-10-CM | POA: Diagnosis not present

## 2022-09-22 DIAGNOSIS — I480 Paroxysmal atrial fibrillation: Secondary | ICD-10-CM

## 2022-09-22 DIAGNOSIS — I10 Essential (primary) hypertension: Secondary | ICD-10-CM | POA: Diagnosis not present

## 2022-09-22 MED ORDER — COLCHICINE 0.6 MG PO TABS: 0.6000 mg | ORAL_TABLET | Freq: Every day | ORAL | 0 refills | Status: DC

## 2022-09-22 NOTE — Patient Instructions (Signed)
Medication Instructions:  Your physician recommends that you continue on your current medications as directed. Please refer to the Current Medication list given to you today.  *If you need a refill on your cardiac medications before your next appointment, please call your pharmacy*  Lab Work: BMET and Magnesium - on same day as nurse visit If you have labs (blood work) drawn today and your tests are completely normal, you will receive your results only by: [place] (if you have MyChart) OR A paper copy in the mail If you have any lab test that is abnormal or we need to change your treatment, we will call you to review the results.  Follow-Up: At [facility], you and your health needs are our priority.  As part of our continuing mission to provide you with exceptional heart care, we have created designated Provider Care Teams.  These Care Teams include your primary Cardiologist (physician) and Advanced Practice Providers (APPs -  Physician Assistants and Nurse Practitioners) who all work together to provide you with the care you need, when you need it.  Your next appointment:   Nurse visit in 1 month for EKG  Follow up with APP in May 2024

## 2022-09-22 NOTE — Progress Notes (Signed)
Per Dr. Tenny Craw, 0.6mg  colchicine PO daily #6 sent to patient's preferred pharmacy to be taken 1 week prior to convergent procedure.

## 2022-09-22 NOTE — Addendum Note (Signed)
Addended by: Bernestine Amass on: 09/22/2022 01:21 PM   Modules accepted: Orders

## 2022-09-22 NOTE — Progress Notes (Signed)
Virtual Visit via Video Note   Because of Nathaniel [name] co-morbid illnesses, he is at least at moderate risk for complications without adequate follow up.  This format is felt to be most appropriate for this patient at this time.  All issues noted in this document were discussed and addressed.  A limited physical exam was performed with this format.  Please refer to the patient's chart for his consent to telehealth for [name].       Date:  09/22/2022   ID:  Nathaniel [name], DOB [birth year], MRN [id number] The patient was identified using 2 identifiers.  Patient Location: Home Provider Location: Office/Clinic   PCP:  Neale Burly, MD   [place] [facility] Cardiologist:  Vickie Epley, MD Electrophysiologist:  Vickie Epley, MD     Evaluation Performed:  Follow-Up Visit  Chief Complaint: Atrial fibrillation  History of Present Illness:    Nathaniel [name] is a 32 y.o. male with atrial fibrillation who returns for follow-up.  I last saw him in the office in September 2023.  He has had multiple prior ablations and failed Tikosyn.  I referred him to Dr. Tenny Craw to discuss hybrid ablation.  They are tentatively looking at March 2024 to proceed with this plan.  He continues to struggle with symptomatic atrial fibrillation.  He reports a 52% burden of AF on his apple watch. He reports feeling poorly in AF. He is waiting to hear about surgical ablation with Dr Tenny Craw.    Past Medical History:  Diagnosis Date   Acute pancreatitis without infection or necrosis 03/05/2016   Atrial fibrillation (HCC)    Atrial flutter ([place]) 01/17/2016   Admx 5/17 with AFlutter with RVR in setting of pancreatitis // CHADS2-VASc=1 (CHF) // Anticoag not started // Amiodarone >> NSR    Cardiomyopathy (HCC)    CHF (congestive heart failure) (HCC)    Chronic lower back pain    Chronic systolic CHF (congestive heart failure) ([place])    Echo 01/17/16 - EF 35-40%, diffuse  HK, mild LAE    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[place]) 02/03/2016   a. EF 35-40% in setting of AFlutter with RVR in 5/17 // probably tachycardia mediated // needs FU echo 2-3 mos after NSR restored // b. Limited Echo 7/17: EF 45-50%, diff HK, mild LAE   Depression    "last 2-3 months; never treated for it" (12/09/2016)   Fatty liver 12/2015   per CT   Gastroenteritis 06/05/2017   Headache    "monthly" (12/09/2016)   Obesity    BMI 45 12/2015   Pancreatic pseudocyst    Pancreatitis 2012   recurrent acute. Admissions to [facility], 12/2015 admissin to [place], ct then shows pseudocyst.    Pancreatitis 2012 X 2; 2017 X 2   Pancreatitis 03/05/2016   Paroxysmal atrial fibrillation (HCC)    Typical atrial flutter ([place]) 06/06/2017   Past Surgical History:  Procedure Laterality Date   ATRIAL FIBRILLATION ABLATION N/A 12/09/2016   Procedure: Atrial Fibrillation Ablation;  Surgeon: Thompson Grayer, MD;  Location: [place] [facility];  Service: Cardiovascular;  Laterality: N/A;   ATRIAL FIBRILLATION ABLATION N/A 11/09/2019   Procedure: ATRIAL FIBRILLATION ABLATION;  Surgeon: Thompson Grayer, MD;  Location: [place] [facility];  Service: Cardiovascular;  Laterality: N/A;   FRACTURE SURGERY     HAND SURGERY Right ~ 2008   "had pins in it; pulled them"   TEE WITHOUT CARDIOVERSION N/A 12/08/2016   Procedure: TRANSESOPHAGEAL ECHOCARDIOGRAM (TEE);  Surgeon:  Jerline Pain, MD;  Location: [facility] ENDOSCOPY;  Service: Cardiovascular;  Laterality: N/A;   [place]     "in my teens"     No outpatient medications have been marked as taking for the 09/22/22 encounter (Video Visit) with Vickie Epley, MD.     Allergies:   Morphine   Social History   Tobacco Use   Smoking status: Former    Packs/day: 0.50    Types: Cigarettes   Smokeless tobacco: Current    Types: Snuff   Tobacco comments:    Nicotine patches sent 07/12/22  Vaping Use   Vaping Use: Never used  Substance Use Topics   Alcohol  use: Not Currently    Comment: last drink 2018   Drug use: Not Currently    Types: Marijuana    Comment: 12/09/2016 "daily from age 40 to 31"     Family Hx: The patient's family history includes Cancer in his maternal grandfather and maternal grandmother; Cirrhosis in his maternal grandfather; Diabetes in his mother; Gallbladder disease in his mother; Hypertension in his father; Stroke in his paternal grandmother; Supraventricular tachycardia in his brother. There is no history of Celiac disease, Breast cancer, Cholelithiasis, Colon cancer, Colon polyps, Crohn's disease, Esophageal cancer, Hepatitis, Inflammatory bowel disease, Liver disease, Pancreatitis, Stomach cancer, Thyroid disease, or Ulcerative colitis.  ROS:   Please see the history of present illness.     All other systems reviewed and are negative.         Objective:    Vital Signs:  There were no vitals taken for this visit.   VITAL SIGNS:  reviewed  ASSESSMENT & PLAN:    #Persistent atrial fibrillation Continue anticoagulation QTc acceptable for ongoing Tikosyn use Update BMP and magnesium today Ongoing discussions with Dr. Tenny Craw regarding scheduling hybrid ablation  Plan for RN visit in February with BMP for routine dofetilide monitoring. Plan for in-person visit in May unless he proceeds with surgery with Dr Tenny Craw.  I will send Dr Tenny Craw a msg re: timing of ablation procedure.  #Hypertension Recommend checking blood pressures 1-2 times per week at home and recording the values.  Recommend bringing these recordings to the primary care physician.          Time:   Today, I have spent 8 minutes with the patient with telehealth technology discussing the above problems.     Medication Adjustments/Labs and Tests Ordered: Current medicines are reviewed at length with the patient today.  Concerns regarding medicines are outlined above.   Tests Ordered: No orders of the defined types were placed in this  encounter.   Medication Changes: No orders of the defined types were placed in this encounter.   Signed, Vickie Epley, MD  09/22/2022 1:17 PM    [place]

## 2022-09-24 ENCOUNTER — Encounter: Payer: Self-pay | Admitting: *Deleted

## 2022-09-27 ENCOUNTER — Ambulatory Visit (INDEPENDENT_AMBULATORY_CARE_PROVIDER_SITE_OTHER): Payer: 59 | Admitting: Clinical

## 2022-09-27 DIAGNOSIS — F331 Major depressive disorder, recurrent, moderate: Secondary | ICD-10-CM | POA: Diagnosis not present

## 2022-09-27 DIAGNOSIS — F419 Anxiety disorder, unspecified: Secondary | ICD-10-CM

## 2022-09-27 NOTE — Progress Notes (Signed)
Virtual Visit via Video Note  I connected with Nathaniel [name] on 09/27/22 at  2:00 PM EST by a video enabled telemedicine application and verified that I am speaking with the correct person using two identifiers.  Location: Patient: Home Provider: Office   I discussed the limitations of evaluation and management by telemedicine and the availability of in person appointments. The patient expressed understanding and agreed to proceed.   THERAPIST PROGRESS NOTE   Session Time: 2:00 PM- 2:45 PM   Participation Level: Active   Behavioral Response: CasualAlertDepressed   Type of Therapy: Individual Therapy   Treatment Goals addressed: Coping   Interventions: CBT, Motivational Interviewing, Strength-based and Supportive   Summary: Nathaniel [name] is a 32 y.o. male who presents with Depression with Anxiety. The OPT therapist worked with the patient for his OPT treatment .The OPT therapist utilized Motivational Interviewing to assist in creating therapeutic repore. The OPT therapist gained feedback about the patients triggers and symptoms over the past few week.The patient spoke about impact of health related stress and upcoming surgery in February.The OPT therapist utilized Cognitive Behavioral Therapy through cognitive restructuring as well as worked on coping strategies to assist in management of his mental health symptoms. The OPT therapist continued to place emphasis on the patient and challenging ANTS (automatic negative thoughts) to improve his mental health. The OPT therapist worked with the patient on balancing stress/ with coping. The patient worked with the [place] therapist on keeping good boundaries in the different sections of his life and working on his reactive behavior.   Suicidal/Homicidal: Nowithout intent/plan   Therapist Response: The OPT therapist worked with the patient for the patients scheduled session. The patient was engaged in his session and gave feedback in  relation to triggers, symptoms, and behavior responses over the past few weeks. The OPT therapist worked utilizing an in Warden/ranger Therapy exercise. The patient noted, " I am feeling okay about the surgery coming up just knowing it is something that I need to do". The patient spoke about being mindful of work life balance and doing better at working on different sections of his life. The The OPT therapist worked with the patient to be mindful of self care and reviewed with the patient basic health areas including sleep cycle, eating habits,  physical exercise, and hygiene. The OPT therapist continued to review stress/ coping balance and management of different sections of his life. The OPT therapist will continue treatment work with the patient in his next session.      Plan: Follow up in 2/3 weeks   Diagnosis:      Axis I: Recurrent, Moderate Depressive Disorder with Anxiety                        Axis II: No diagnosis   Collaboration of Care: No additional collaboration for this session.   Patient/Guardian was advised Release of Information must be obtained prior to any record release in order to collaborate their care with an outside provider. Patient/Guardian was advised if they have not already done so to contact the registration department to sign all necessary forms in order for [place] to release information regarding their care.    Consent: Patient/Guardian gives verbal consent for treatment and assignment of benefits for services provided during this visit. Patient/Guardian expressed understanding and agreed to proceed.    I discussed the assessment and treatment plan with the patient. The patient was provided an opportunity to ask questions and  all were answered. The patient agreed with the plan and demonstrated an understanding of the instructions.   The patient was advised to call back or seek an in-person evaluation if the symptoms worsen or if the condition fails to  improve as anticipated.   I provided 45 minutes of non-face-to-face time during this encounter.     [name], LCSW   09/27/2022

## 2022-10-07 ENCOUNTER — Encounter (HOSPITAL_COMMUNITY): Payer: Self-pay | Admitting: *Deleted

## 2022-10-12 ENCOUNTER — Telehealth: Payer: Self-pay

## 2022-10-12 NOTE — Telephone Encounter (Signed)
Patient contacted the office requesting approval to start his antibiotic for "tooth trouble" he has been having. He states that he had a tooth pulled 3 weeks ago and a couple days ago he noticed some redness and a "pocket of fluid". Patient states that he went to the dentist and patient's dentist placed him on Amoxicillin prophylactic due to upcoming procedure with Dr. Tenny Craw. Advised that he should take the medication as his dentist prescribed and to contact the office back if anything changes with his teeth between now and his surgical date. Patient acknowledged receipt and is picking up medication today.

## 2022-10-13 NOTE — Pre-Procedure Instructions (Signed)
Surgical Instructions    Your procedure is scheduled on October 18, 2022.  Report to [facility] Main Entrance "A" at 10:00 A.M., then check in with the Admitting office.  Call this number if you have problems the morning of surgery:  [phone]  If you have any questions prior to your surgery date call [phone]: Open Monday-Friday 8am-4pm If you experience any cold or flu symptoms such as cough, fever, chills, shortness of breath, etc. between now and your scheduled surgery, please notify us at the above number.     Remember:  Do not eat or drink after midnight the night before your surgery     Take these medicines the morning of surgery with A SIP OF WATER:  amoxicillin (AMOXIL)   clonazePAM (KLONOPIN)   diltiazem (CARDIZEM CD)   dofetilide (TIKOSYN)   pantoprazole (PROTONIX)   sertraline (ZOLOFT)    Take these medicines IF NEEDED:  acetaminophen (TYLENOL)   diltiazem (CARDIZEM)    STOP taking ELIQUIS after your morning dose February 17th.   As of today, STOP taking any Aspirin (unless otherwise instructed by your surgeon) Aleve, Naproxen, Ibuprofen, Motrin, Advil, Goody's, BC's, all herbal medications, fish oil, and all vitamins.                     Do NOT Smoke (Tobacco/Vaping) for 24 hours prior to your procedure.  If you use a CPAP at night, you may bring your mask/headgear for your overnight stay.   Contacts, glasses, piercing's, hearing aid's, dentures or partials may not be worn into surgery, please bring cases for these belongings.    For patients admitted to the hospital, discharge time will be determined by your treatment team.   Patients discharged the day of surgery will not be allowed to drive home, and someone needs to stay with them for 24 hours.  SURGICAL WAITING ROOM VISITATION Patients having surgery or a procedure may have no more than 2 support people in the waiting area - these visitors may rotate.   Children under the age of 27 must have an  adult with them who is not the patient. If the patient needs to stay at the hospital during part of their recovery, the visitor guidelines for inpatient rooms apply. Pre-op nurse will coordinate an appropriate time for 1 support person to accompany patient in pre-op.  This support person may not rotate.   Please refer to the [facility] website for the visitor guidelines for Inpatients (after your surgery is over and you are in a regular room).    Special instructions:   [place]- Preparing For Surgery  Before surgery, you can play an important role. Because skin is not sterile, your skin needs to be as free of germs as possible. You can reduce the number of germs on your skin by washing with CHG (chlorahexidine gluconate) Soap before surgery.  CHG is an antiseptic cleaner which kills germs and bonds with the skin to continue killing germs even after washing.    Oral Hygiene is also important to reduce your risk of infection.  Remember - BRUSH YOUR TEETH THE MORNING OF SURGERY WITH YOUR REGULAR TOOTHPASTE  Please do not use if you have an allergy to CHG or antibacterial soaps. If your skin becomes reddened/irritated stop using the CHG.  Do not shave (including legs and underarms) for at least 48 hours prior to first CHG shower. It is OK to shave your face.  Please follow these instructions carefully.   Shower  the NIGHT BEFORE SURGERY and the MORNING OF SURGERY  If you chose to wash your hair, wash your hair first as usual with your normal shampoo.  After you shampoo, rinse your hair and body thoroughly to remove the shampoo.  Use CHG Soap as you would any other liquid soap. You can apply CHG directly to the skin and wash gently with a scrungie or a clean washcloth.   Apply the CHG Soap to your body ONLY FROM THE NECK DOWN.  Do not use on open wounds or open sores. Avoid contact with your eyes, ears, mouth and genitals (private parts). Wash Face and genitals (private parts)  with your  normal soap.   Wash thoroughly, paying special attention to the area where your surgery will be performed.  Thoroughly rinse your body with warm water from the neck down.  DO NOT shower/wash with your normal soap after using and rinsing off the CHG Soap.  Pat yourself dry with a CLEAN TOWEL.  Wear CLEAN PAJAMAS to bed the night before surgery  Place CLEAN SHEETS on your bed the night before your surgery  DO NOT SLEEP WITH PETS.   Day of Surgery: Take a shower with CHG soap. Do not wear jewelry or makeup Do not wear lotions, powders, perfumes/colognes, or deodorant. Do not shave 48 hours prior to surgery.  Men may shave face and neck. Do not bring valuables to the hospital.  [facility] is not responsible for any belongings or valuables. Do not wear nail polish, gel polish, artificial nails, or any other type of covering on natural nails (fingers and toes) If you have artificial nails or gel coating that need to be removed by a nail salon, please have this removed prior to surgery. Artificial nails or gel coating may interfere with anesthesia's ability to adequately monitor your vital signs.  Wear Clean/Comfortable clothing the morning of surgery Remember to brush your teeth WITH YOUR REGULAR TOOTHPASTE.   Please read over the following fact sheets that you were given.    If you received a COVID test during your pre-op visit  it is requested that you wear a mask when out in public, stay away from anyone that may not be feeling well and notify your surgeon if you develop symptoms. If you have been in contact with anyone that has tested positive in the last 10 days please notify you surgeon.

## 2022-10-14 ENCOUNTER — Encounter (HOSPITAL_COMMUNITY)
Admission: RE | Admit: 2022-10-14 | Discharge: 2022-10-14 | Disposition: A | Discharge status: Home / Self Care | Payer: 59 | Source: Ambulatory Visit | Attending: Cardiothoracic Surgery | Admitting: Cardiothoracic Surgery

## 2022-10-14 ENCOUNTER — Other Ambulatory Visit: Payer: Self-pay

## 2022-10-14 ENCOUNTER — Encounter (HOSPITAL_COMMUNITY): Payer: Self-pay

## 2022-10-14 ENCOUNTER — Ambulatory Visit (HOSPITAL_COMMUNITY)
Admission: RE | Admit: 2022-10-14 | Discharge: 2022-10-14 | Disposition: A | Discharge status: Home / Self Care | Payer: 59 | Source: Ambulatory Visit | Attending: Cardiothoracic Surgery | Admitting: Cardiothoracic Surgery

## 2022-10-14 ENCOUNTER — Ambulatory Visit (INDEPENDENT_AMBULATORY_CARE_PROVIDER_SITE_OTHER): Payer: 59 | Admitting: Cardiothoracic Surgery

## 2022-10-14 VITALS — BP 124/57 | HR 77 | Temp 97.5°F | Resp 19 | Ht 71.0 in | Wt 319.0 lb

## 2022-10-14 VITALS — BP 110/78 | HR 72 | Resp 20 | Ht 72.0 in | Wt 322.0 lb

## 2022-10-14 DIAGNOSIS — Z1152 Encounter for screening for COVID-19: Secondary | ICD-10-CM | POA: Diagnosis not present

## 2022-10-14 DIAGNOSIS — I480 Paroxysmal atrial fibrillation: Secondary | ICD-10-CM

## 2022-10-14 DIAGNOSIS — Z01818 Encounter for other preprocedural examination: Secondary | ICD-10-CM | POA: Insufficient documentation

## 2022-10-14 HISTORY — DX: Anxiety disorder, unspecified: F41.9

## 2022-10-14 HISTORY — DX: Cardiac arrhythmia, unspecified: I49.9

## 2022-10-14 HISTORY — DX: Gastro-esophageal reflux disease without esophagitis: K21.9

## 2022-10-14 HISTORY — DX: Angina pectoris, unspecified: I20.9

## 2022-10-14 HISTORY — DX: Unspecified osteoarthritis, unspecified site: M19.90

## 2022-10-14 LAB — URINALYSIS, ROUTINE W REFLEX MICROSCOPIC
Bilirubin Urine: NEGATIVE
Glucose, UA: NEGATIVE mg/dL
Hgb urine dipstick: NEGATIVE
Ketones, ur: NEGATIVE mg/dL
Leukocytes,Ua: NEGATIVE
Nitrite: NEGATIVE
Protein, ur: NEGATIVE mg/dL
Specific Gravity, Urine: 1.009 (ref 1.005–1.030)
pH: 7 (ref 5.0–8.0)

## 2022-10-14 LAB — BLOOD GAS, ARTERIAL
Acid-Base Excess: 2.7 mmol/L — ABNORMAL HIGH (ref 0.0–2.0)
Bicarbonate: 26.2 mmol/L (ref 20.0–28.0)
Drawn by: 58793
O2 Saturation: 99.4 %
Patient temperature: 37
pCO2 arterial: 36 mmHg (ref 32–48)
pH, Arterial: 7.47 — ABNORMAL HIGH (ref 7.35–7.45)
pO2, Arterial: 116 mmHg — ABNORMAL HIGH (ref 83–108)

## 2022-10-14 LAB — TYPE AND SCREEN
ABO/RH(D): A POS
Antibody Screen: NEGATIVE

## 2022-10-14 LAB — CBC
HCT: 42.5 % (ref 39.0–52.0)
Hemoglobin: 14.1 g/dL (ref 13.0–17.0)
MCH: 29.6 pg (ref 26.0–34.0)
MCHC: 33.2 g/dL (ref 30.0–36.0)
MCV: 89.3 fL (ref 80.0–100.0)
Platelets: 330 10*3/uL (ref 150–400)
RBC: 4.76 MIL/uL (ref 4.22–5.81)
RDW: 12.5 % (ref 11.5–15.5)
WBC: 6.7 10*3/uL (ref 4.0–10.5)
nRBC: 0 % (ref 0.0–0.2)

## 2022-10-14 LAB — COMPREHENSIVE METABOLIC PANEL
ALT: 31 U/L (ref 0–44)
AST: 25 U/L (ref 15–41)
Albumin: 3.9 g/dL (ref 3.5–5.0)
Alkaline Phosphatase: 41 U/L (ref 38–126)
Anion gap: 8 (ref 5–15)
BUN: 10 mg/dL (ref 6–20)
CO2: 23 mmol/L (ref 22–32)
Calcium: 8.7 mg/dL — ABNORMAL LOW (ref 8.9–10.3)
Chloride: 108 mmol/L (ref 98–111)
Creatinine, Ser: 0.71 mg/dL (ref 0.61–1.24)
GFR, Estimated: >60 mL/min (ref >60)
Glucose, Bld: 95 mg/dL (ref 70–99)
Potassium: 3.9 mmol/L (ref 3.5–5.1)
Sodium: 139 mmol/L (ref 135–145)
Total Bilirubin: 0.5 mg/dL (ref 0.3–1.2)
Total Protein: 7.2 g/dL (ref 6.5–8.1)

## 2022-10-14 LAB — PROTIME-INR
INR: 1.2 (ref 0.8–1.2)
Prothrombin Time: 14.7 seconds (ref 11.4–15.2)

## 2022-10-14 LAB — APTT: aPTT: 33 seconds (ref 24–36)

## 2022-10-14 LAB — SURGICAL PCR SCREEN
MRSA, PCR: NEGATIVE
Staphylococcus aureus: POSITIVE — AB

## 2022-10-14 NOTE — Progress Notes (Addendum)
PCP - Dr. Stoney Bang Cardiologist - Dr. Lysbeth Galas T. Quentin Ore  PPM/ICD - Denies Device Orders - n/a Rep Notified - n/a  Chest x-ray - 10/14/2022 EKG - 10/14/2022 Stress Test - 01/04/2019 ECHO - 08/11/2022 Cardiac Cath - Denies   Sleep Study - Denies CPAP - n/a  No Dm  Last dose of GLP1 agonist- n/a GLP1 instructions: n/a  Blood Thinner Instructions: Last dose Eliquis will be the morning dose on February 17th. Aspirin Instructions: n/a  NPO after midnight  COVID TEST- Yes. Result Pending   Anesthesia review: Yes. Abnormal EKG. Pt does state that the 107 HR on EKG is lower than the usual heart rate he experiences with a.fib.  Patient denies shortness of breath, fever, cough and chest pain at PAT appointment   All instructions explained to the patient, with a verbal understanding of the material. Patient agrees to go over the instructions while at home for a better understanding. Patient also instructed to self quarantine after being tested for COVID-19. The opportunity to ask questions was provided.

## 2022-10-14 NOTE — Progress Notes (Signed)
Plan for surgery on Monday.          [address]       Nathaniel [name],Nathaniel [name] [address]             [phone]                                                   Nathaniel [name] Medical Record [id number] Date of Birth: [birth year]   Referring: Oliver Barre, PA Primary Care: Neale Burly, MD Primary Cardiologist: Vickie Epley, MD   Chief Complaint:        Chief Complaint  Patient presents with   Atrial Fibrillation      Surgical consult      History of Present Illness:    Nathaniel [name] 32 y.o. male with a history of persistent AF. Back in 2017 had EF of 35%. Started on amiodarone.    Had a catheter ablation in 2017.  Got 2 years from 1st ablation   Redo catheter ablation 2021 Back in af immediately after 2nd ablation   In and out of AF all the time. Feels a lot worse in AF.   Pancreast divisum. Annuluas panceas.  Can't get to cyst  Gallbladder out in June lst year. Only 1 pancreatitis since then.  Treated with fluids and pain  meds and 2-3 day stay in the past. No stay more than 4 days, not intubation.    Dilauidid works better than morphine.        Past Medical History:  Diagnosis Date   Acute pancreatitis without infection or necrosis 03/05/2016   Atrial fibrillation (HCC)     Atrial flutter ([place]) 01/17/2016    Admx 5/17 with AFlutter with RVR in setting of pancreatitis // CHADS2-VASc=1 (CHF) // Anticoag not started // Amiodarone >> NSR    Cardiomyopathy (HCC)     CHF (congestive heart failure) (HCC)     Chronic lower back pain     Chronic systolic CHF (congestive heart failure) ([place])      Echo 01/17/16 - EF 35-40%, diffuse HK, mild LAE    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[place]) 02/03/2016    a. EF 35-40% in setting of AFlutter with RVR in 5/17 // probably tachycardia mediated // needs FU echo 2-3 mos after NSR restored // b. Limited Echo 7/17: EF 45-50%, diff HK, mild LAE   Depression      "last 2-3 months; never treated for it"  (12/09/2016)   Fatty liver 12/2015    per CT   Gastroenteritis 06/05/2017   Headache      "monthly" (12/09/2016)   Obesity      BMI 45 12/2015   Pancreatic pseudocyst     Pancreatitis 2012    recurrent acute. Admissions to [facility], 12/2015 admissin to [facility], ct then shows pseudocyst.    Pancreatitis 2012 X 2; 2017 X 2   Pancreatitis 03/05/2016   Paroxysmal atrial fibrillation (HCC)     Typical atrial flutter ([place]) 06/06/2017           Past Surgical History:  Procedure Laterality Date   ATRIAL FIBRILLATION ABLATION N/A 12/09/2016    Procedure: Atrial Fibrillation Ablation;  Surgeon: Thompson Grayer, MD;  Location: [facility];  Service: Cardiovascular;  Laterality: N/A;   ATRIAL FIBRILLATION ABLATION N/A 11/09/2019    Procedure: ATRIAL  FIBRILLATION ABLATION;  Surgeon: Thompson Grayer, MD;  Location: [facility];  Service: Cardiovascular;  Laterality: N/A;   FRACTURE SURGERY       HAND SURGERY Right ~ 2008    "had pins in it; pulled them"   TEE WITHOUT CARDIOVERSION N/A 12/08/2016    Procedure: TRANSESOPHAGEAL ECHOCARDIOGRAM (TEE);  Surgeon: Jerline Pain, MD;  Location: [facility] ENDOS[facility];  Service: Cardiovascular;  Laterality: N/A;   TONSILLECTOMY AND ADENOIDECTOMY        "in my teens"           Family History  Problem Relation Age of Onset   Diabetes Mother     Gallbladder disease Mother     Hypertension Father     Supraventricular tachycardia Brother     Cancer Maternal Grandmother     Cancer Maternal Grandfather     Cirrhosis Maternal Grandfather     Stroke Paternal Grandmother     Celiac disease Neg Hx     Breast cancer Neg Hx     Cholelithiasis Neg Hx     Colon cancer Neg Hx     Colon polyps Neg Hx     Crohn's disease Neg Hx     Esophageal cancer Neg Hx     Hepatitis Neg Hx     Inflammatory bowel disease Neg Hx     Liver disease Neg Hx     Pancreatitis Neg Hx     Stomach cancer Neg Hx     Thyroid disease Neg Hx     Ulcerative colitis Neg Hx          Social  History        Tobacco Use  Smoking Status Former   Packs/day: 0.50   Types: Cigarettes  Smokeless Tobacco Current   Types: Snuff  Tobacco Comments    Nicotine patches sent 07/12/22    Social History        Substance and Sexual Activity  Alcohol Use Not Currently    Comment: last drink 2018            Allergies  Allergen Reactions   Morphine Nausea And Vomiting            Current Outpatient Medications  Medication Sig Dispense Refill   acetaminophen (TYLENOL) 325 MG tablet Take 650 mg by mouth as needed.       clonazePAM (KLONOPIN) 0.5 MG tablet Take 0.5 tablets (0.25 mg total) by mouth 2 (two) times daily. 30 tablet 0   cyanocobalamin (VITAMIN B12) 500 MCG tablet Take 500 mcg by mouth daily.       diltiazem (CARDIZEM CD) 240 MG 24 hr capsule TAKE ONE CAPSULE BY MOUTH EVERY DAY 90 capsule 1   diltiazem (CARDIZEM) 30 MG tablet Take 1 tablet every 4 hours AS NEEDED for heart rate >100 30 tablet 1   dofetilide (TIKOSYN) 500 MCG capsule Take 1 capsule (500 mcg total) by mouth 2 (two) times daily. 30 capsule 2   ELIQUIS 5 MG TABS tablet TAKE 1 TABLET BY MOUTH TWICE DAILY 60 tablet 5   metoprolol succinate (TOPROL XL) 25 MG 24 hr tablet Take 1 tablet (25 mg total) by mouth at bedtime. 30 tablet 3   Multiple Vitamin (ONE-A-DAY MENS PO) Take 1 tablet by mouth every morning.       nicotine (NICODERM CQ - DOSED IN MG/24 HOURS) 21 mg/24hr patch Place 1 patch (21 mg total) onto the skin daily. (Patient not taking: Reported on 07/16/2022) 28 patch 0  pantoprazole (PROTONIX) 40 MG tablet TAKE 1 TABLET(40 MG) BY MOUTH DAILY (Patient taking differently: Take 40 mg by mouth daily.) 90 tablet 3   sertraline (ZOLOFT) 25 MG tablet Take 1.5 tablets (37.5 mg total) by mouth daily. TAKE 1 AND 1/2 TABLETS BY MOUTH EVERY DAY 45 tablet 0   triamcinolone cream (KENALOG) 0.1 % Apply 1 application topically daily as needed (for irritation).         No current facility-administered medications for  this visit.      ROS 14 point ROS reviewed and negative except as per HPI     PHYSICAL EXAMINATION: BP 128/80   Pulse 76   Resp 20   Ht 6' (1.829 m)   Wt (!) 316 lb (143.3 kg)   SpO2 97% Comment: RA  BMI 42.86 kg/m    Gen: NAD Neuro: Alert and oriented CV: irreg irreg rhythm Resp: Nonlaboured Abd: Soft, ntnd Extr: WWP   Diagnostic Studies & Laboratory data:     Recent Radiology Findings:    Imaging Results  ECHOCARDIOGRAM COMPLETE   Result Date: 08/11/2022    ECHOCARDIOGRAM REPORT   Patient Name:   JAMESEN SCALONE Date of Exam: 08/11/2022 Medical Rec #:  [id number]         Height:       72.0 in Accession #:    [id number]        Weight:       316.0 lb Date of Birth:  Feb 25, 1991          BSA:          2.588 m Patient Age:    6 years          BP:           131/86 mmHg Patient Gender: M                 HR:           70 bpm. Exam Location:  [facility] Procedure: 2D Echo, Cardiac Doppler and Color Doppler Indications:    I48.0 (ICD-10-CM) - Paroxysmal atrial fibrillation  History:        Patient has prior history of Echocardiogram examinations, most                 recent 01/25/2019. Arrythmias:Atrial Flutter and Atrial                 Fibrillation; Risk Factors:Former Smoker and Hypertension.                 Morbid Obesity, Cardiomyopathy Ablation 12/09/16.  Sonographer:    Alvino Chapel RCS Referring Phys: [id number] [employer]  1. Left ventricular ejection fraction, by estimation, is 50 to 55%. The left ventricle has low normal function. The left ventricle has no regional wall motion abnormalities. The left ventricular internal cavity size was mildly dilated. Left ventricular diastolic parameters were normal.  2. Right ventricular systolic function is normal. The right ventricular size is normal. Tricuspid regurgitation signal is inadequate for assessing PA pressure.  3. Left atrial size was upper normal.  4. Right atrial size was mildly dilated.  5. The mitral valve is  grossly normal. Trivial mitral valve regurgitation.  6. The aortic valve is tricuspid. Aortic valve regurgitation is not visualized.  7. The inferior vena cava is normal in size with <50% respiratory variability, suggesting right atrial pressure of 8 mmHg. Comparison(s): Prior images reviewed side by side. LVEF low normal range at 50-55%. Upper normal left  atrial chamber size and mildly dilated right atirum. FINDINGS  Left Ventricle: Left ventricular ejection fraction, by estimation, is 50 to 55%. The left ventricle has low normal function. The left ventricle has no regional wall motion abnormalities. The left ventricular internal cavity size was mildly dilated. There is no left ventricular hypertrophy. Left ventricular diastolic parameters were normal. Right Ventricle: The right ventricular size is normal. No increase in right ventricular wall thickness. Right ventricular systolic function is normal. Tricuspid regurgitation signal is inadequate for assessing PA pressure. Left Atrium: Left atrial size was upper normal. Right Atrium: Right atrial size was mildly dilated. Pericardium: There is no evidence of pericardial effusion. Mitral Valve: The mitral valve is grossly normal. Trivial mitral valve regurgitation. Tricuspid Valve: The tricuspid valve is grossly normal. Tricuspid valve regurgitation is trivial. Aortic Valve: The aortic valve is tricuspid. Aortic valve regurgitation is not visualized. Pulmonic Valve: The pulmonic valve was grossly normal. Pulmonic valve regurgitation is trivial. Aorta: The aortic root is normal in size and structure. Venous: The inferior vena cava is normal in size with less than 50% respiratory variability, suggesting right atrial pressure of 8 mmHg. IAS/Shunts: No atrial level shunt detected by color flow Doppler.  LEFT VENTRICLE PLAX 2D LVIDd:         5.80 cm      Diastology LVIDs:         4.10 cm      LV e' medial:    9.46 cm/s LV PW:         0.90 cm      LV E/e' medial:  8.2 LV  IVS:        0.90 cm      LV e' lateral:   13.30 cm/s LVOT diam:     2.40 cm      LV E/e' lateral: 5.8 LV SV:         97 LV SV Index:   38 LVOT Area:     4.52 cm  LV Volumes (MOD) LV vol d, MOD A2C: 170.0 ml LV vol d, MOD A4C: 181.0 ml LV vol s, MOD A2C: 75.1 ml LV vol s, MOD A4C: 83.2 ml LV SV MOD A2C:     94.9 ml LV SV MOD A4C:     181.0 ml LV SV MOD BP:      96.5 ml RIGHT VENTRICLE RV S prime:     11.70 cm/s TAPSE (M-mode): 2.6 cm LEFT ATRIUM             Index        RIGHT ATRIUM           Index LA diam:        5.00 cm 1.93 cm/m   RA Area:     27.70 cm LA Vol (A2C):   98.2 ml 37.95 ml/m  RA Volume:   101.00 ml 39.03 ml/m LA Vol (A4C):   82.1 ml 31.73 ml/m LA Biplane Vol: 91.4 ml 35.32 ml/m  AORTIC VALVE LVOT Vmax:   90.90 cm/s LVOT Vmean:  64.000 cm/s LVOT VTI:    0.215 m  AORTA Ao Root diam: 3.90 cm MITRAL VALVE MV Area (PHT): 3.12 cm    SHUNTS MV Decel Time: 243 msec    Systemic VTI:  0.22 m MV E velocity: 77.10 cm/s  Systemic Diam: 2.40 cm MV A velocity: 44.20 cm/s MV E/A ratio:  1.74 Rozann Lesches MD Electronically signed by Rozann Lesches MD Signature Date/Time: 08/11/2022/3:50:36 PM    Final  I have independently reviewed the above radiology studies  and reviewed the findings with the patient.    Recent Lab Findings: Recent Labs       Lab Results  Component Value Date    WBC 6.4 11/06/2019    HGB 14.0 11/06/2019    HCT 42.8 11/06/2019    PLT 274 11/06/2019    GLUCOSE 101 (H) 07/16/2022    CHOL 116 05/11/2018    TRIG 61 05/11/2018    HDL 31 (L) 05/11/2018    LDLCALC 73 05/11/2018    ALT 19 05/11/2018    AST 17 05/11/2018    NA 142 07/16/2022    K 3.9 07/16/2022    CL 105 07/16/2022    CREATININE 0.77 07/16/2022    BUN 10 07/16/2022    CO2 25 07/16/2022    TSH 2.70 03/04/2016    INR 1.22 01/18/2016      TTE 12/13 EF low normal:    IMPRESSIONS     1. Left ventricular ejection fraction, by estimation, is 50 to 55%. The  left ventricle has low normal  function. The left ventricle has no regional  wall motion abnormalities. The left ventricular internal cavity size was  mildly dilated. Left ventricular  diastolic parameters were normal.   2. Right ventricular systolic function is normal. The right ventricular  size is normal. Tricuspid regurgitation signal is inadequate for assessing  PA pressure.   3. Left atrial size was upper normal.   4. Right atrial size was mildly dilated.   5. The mitral valve is grossly normal. Trivial mitral valve  regurgitation.   6. The aortic valve is tricuspid. Aortic valve regurgitation is not  visualized.   7. The inferior vena cava is normal in size with <50% respiratory  variability, suggesting right atrial pressure of 8 mmHg.    Assessment / Plan:   69M with persistent AF s/p 2 catheter ablations. Feels sig worse in AF.    Most sig other medical issue is hx of pancreatitis -  Pancreast divisum. Annuluas panceas.  Can't get to cyst  Gallbladder out in June lst year. Only 1 pancreatitis since then.  Treated with fluids and pain  meds and 2-3 day stay in the past. No stay more than 4 days, not intubation.    I told him we are getting started with Convergent here and he would be on of the initial patients. I offered him procedure to be later if our experience here if that's what he prefers. It seems that he wants to think about and likely move forward in March. AF is essentially disabling for him, inter-related with anxiety, and making it hard to move forward in life.   Typical workup includes: Coronary eval for patients > 14 yo. - N/A as young patient Echo in the last year - completed 08/11/22     We broadly discussed the rationale for the convergent approach.  Standard recovery is walking around day after procedure, discharge to home postop day 2.  I see convergent (epicardial ablation) patients postoperatively back in the clinic in 1 week and 1 month. Patients generally have a full recovery by 3  weeks.    Risks/benefits/alternatives discussed at length. All questions were asked an answered. Risks are <<1% mortality, 2% morbidity (bleeding, infection, damage to surrounding structures, pancreatic issues and prolonged stay given hx recurrent pancreatitis) and >97% standard recovery.   Typically I do these procedures on Mondays, last dose of Eliquis is Saturday morning. I restart AC  the night of surgery. If they receive an LAA clip, most patients, but not all, come off their AC 2-6 months after surgery at the discretion of their referring cardiologist. Many ultimately have reductions in the degree of antiarrythmic medications as well. Expected outcome is significantly more freedom from Afib (measured by 90% reduction of time in AF).   Discuss moving forward possible Convergent/LAA clip in March 2024     [place] 09/05/2022 12:09 PM

## 2022-10-15 LAB — SARS CORONAVIRUS 2 (TAT 6-24 HRS): SARS Coronavirus 2: NEGATIVE

## 2022-10-15 NOTE — Progress Notes (Signed)
Anesthesia Chart Review:  Case: [id number] Date/Time: 10/18/22 1145   Procedures:      CONVERGENT PROCEDURE     CLIPPING OF LEFT ATRIAL APPENDAGE     TRANSESOPHAGEAL ECHOCARDIOGRAM   Anesthesia type: General   Pre-op diagnosis: afib   Location: [facility] OR ROOM 31 / [place] OR   Surgeons: Neomia Glass, MD       DISCUSSION: Patient is a 32 year old male for the above procedure.  History includes former smoker (quit 08/30/14), dilated cardiomyopathy, chronic systolic CHF, afib/flutter (diagnosed 12/2015; s/p ablation 12/09/16, 11/09/19), fatty liver, pancreatitis with chronic pseudocyst (first episode 2012 in setting of alcohol dependence, now sober since 2018, but recurrence x 5-6, s/p cholecystectomy 01/2021), GERD, chronic low back pain).   He reported instructions to hold Eliquis, last dose 10/16/2022  Preoperative COVID-19 test negative on 10/14/2022.  10/14/2022 chest x-ray still in process.  Anesthesia team to evaluate on the day of surgery.   VS: BP (!) 124/57   Pulse 77   Temp (!) 36.4 C   Resp 19   Ht 5' 11"$  (1.803 m)   Wt (!) 144.7 kg   SpO2 98%   BMI 44.49 kg/m    PROVIDERS: Neale Burly, MD is PCP Lars Mage, MD is EP cardiologist   LABS: Labs reviewed: Acceptable for surgery. (all labs ordered are listed, but only abnormal results are displayed)  Labs Reviewed  SURGICAL PCR SCREEN - Abnormal; Notable for the following components:      Result Value   Staphylococcus aureus POSITIVE (*)    All other components within normal limits  COMPREHENSIVE METABOLIC PANEL - Abnormal; Notable for the following components:   Calcium 8.7 (*)    All other components within normal limits  BLOOD GAS, ARTERIAL - Abnormal; Notable for the following components:   pH, Arterial 7.47 (*)    pO2, Arterial 116 (*)    Acid-Base Excess 2.7 (*)    All other components within normal limits  SARS CORONAVIRUS 2 (TAT 6-24 HRS)  CBC  PROTIME-INR  APTT  URINALYSIS, ROUTINE W REFLEX  MICROSCOPIC  TYPE AND SCREEN    IMAGES: CXR 10/14/22: In process.  CT Chest 09/06/22: IMPRESSION: No acute cardiopulmonary process.    EKG: 10/14/22: Atrial fibrillation with rapid ventricular response at 107 bpm Abnormal ECG When compared with ECG of 16-Jul-2022 11:54, PREVIOUS ECG IS PRESENT No significant change since last tracing Confirmed by Jenkins Rouge [phone]) on 10/15/2022 9:02:44 AM   CV: Echo 08/11/22: IMPRESSIONS   1. Left ventricular ejection fraction, by estimation, is 50 to 55%. The  left ventricle has low normal function. The left ventricle has no regional  wall motion abnormalities. The left ventricular internal cavity size was  mildly dilated. Left ventricular  diastolic parameters were normal.   2. Right ventricular systolic function is normal. The right ventricular  size is normal. Tricuspid regurgitation signal is inadequate for assessing  PA pressure.   3. Left atrial size was upper normal.   4. Right atrial size was mildly dilated.   5. The mitral valve is grossly normal. Trivial mitral valve  regurgitation.   6. The aortic valve is tricuspid. Aortic valve regurgitation is not  visualized.   7. The inferior vena cava is normal in size with <50% respiratory  variability, suggesting right atrial pressure of 8 mmHg.  - Comparison(s): Prior images reviewed side by side. LVEF low normal range  at 50-55%. Upper normal left atrial chamber size and mildly dilated right  atirum.  CT Coronary Morphology (pre-ablation) 11/05/19: IMPRESSION: 1. Coronary calcium score 0 Agatston units, suggesting low risk for future cardiac events. 2.  No coronary disease noted. 3.  Pulmonary veins as described above. 4.  No thrombus in the left atrium or appendage.   ETT 01/04/19: No diagnostic ST segment changes to indicate ischemia at maximum workload of 11.7 METS. Hypertensive response noted. No chest pain reported. Normal exercise tolerance. There were bursts of atrial  fibrillation noted both at rest prior to the study, intermittently during exercise, and in recovery. Importantly, no ventricular arrhythmias were noted. Low risk Duke treadmill score of 9.5. Blood pressure demonstrated a hypertensive response to exercise.     Past Medical History:  Diagnosis Date   Acute pancreatitis without infection or necrosis 03/05/2016   Anginal pain (HCC)    Happens around moments of A.Fib   Anxiety    Arthritis    Back   Atrial fibrillation (HCC)    Atrial flutter ([place]) 01/17/2016   Admx 5/17 with AFlutter with RVR in setting of pancreatitis // CHADS2-VASc=1 (CHF) // Anticoag not started // Amiodarone >> NSR    Cardiomyopathy (HCC)    CHF (congestive heart failure) (HCC)    Chronic lower back pain    Chronic systolic CHF (congestive heart failure) ([place])    Echo 01/17/16 - EF 35-40%, diffuse HK, mild LAE    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[place]) 02/03/2016   a. EF 35-40% in setting of AFlutter with RVR in 5/17 // probably tachycardia mediated // needs FU echo 2-3 mos after NSR restored // b. Limited Echo 7/17: EF 45-50%, diff HK, mild LAE   Depression    "last 2-3 months; never treated for it" (12/09/2016)   Dysrhythmia    A. Fib   Fatty liver 12/2015   per CT   Gastroenteritis 06/05/2017   GERD (gastroesophageal reflux disease)    Headache    "monthly" (12/09/2016)   Obesity    BMI 45 12/2015   Pancreatic pseudocyst    Pancreatitis 2012   recurrent acute. Admissions to [facility], 12/2015 admissin to [facility], ct then shows pseudocyst.    Pancreatitis 2012 X 2; 2017 X 2   Pancreatitis 03/05/2016   Paroxysmal atrial fibrillation ([place])    Typical atrial flutter ([place]) 06/06/2017    Past Surgical History:  Procedure Laterality Date   ATRIAL FIBRILLATION ABLATION N/A 12/09/2016   Procedure: Atrial Fibrillation Ablation;  Surgeon: Thompson Grayer, MD;  Location: [facility];  Service: Cardiovascular;  Laterality: N/A;   ATRIAL FIBRILLATION ABLATION N/A  11/09/2019   Procedure: ATRIAL FIBRILLATION ABLATION;  Surgeon: Thompson Grayer, MD;  Location: [facility];  Service: Cardiovascular;  Laterality: N/A;   CHOLECYSTECTOMY  2022   HAND SURGERY Right ~ 2008   "had pins in it; pulled them"   TEE WITHOUT CARDIOVERSION N/A 12/08/2016   Procedure: TRANSESOPHAGEAL ECHOCARDIOGRAM (TEE);  Surgeon: Jerline Pain, MD;  Location: [facility] ENDOSCOPY;  Service: Cardiovascular;  Laterality: N/A;   TONSILLECTOMY AND ADENOIDECTOMY     "in my teens"    MEDICATIONS:  acetaminophen (TYLENOL) 650 MG CR tablet   amoxicillin (AMOXIL) 875 MG tablet   clonazePAM (KLONOPIN) 0.5 MG tablet   colchicine 0.6 MG tablet   diltiazem (CARDIZEM CD) 240 MG 24 hr capsule   diltiazem (CARDIZEM) 30 MG tablet   dofetilide (TIKOSYN) 500 MCG capsule   ELIQUIS 5 MG TABS tablet   metoprolol succinate (TOPROL XL) 25 MG 24 hr tablet   Multiple Vitamin (ONE-A-DAY  MENS PO)   nicotine (NICODERM CQ - DOSED IN MG/24 HOURS) 21 mg/24hr patch   pantoprazole (PROTONIX) 40 MG tablet   sertraline (ZOLOFT) 25 MG tablet   triamcinolone cream (KENALOG) 0.1 %   No current facility-administered medications for this encounter.   Not currently taking amoxicillin.   Myra Gianotti, PA-C Surgical Short Stay/Anesthesiology [facility] Phone [phone] [facility] Phone [phone] 10/15/2022 11:53 AM

## 2022-10-15 NOTE — Anesthesia Preprocedure Evaluation (Signed)
Anesthesia Evaluation  Patient identified by MRN, date of birth, ID band Patient awake    Reviewed: Allergy & Precautions, H&P , NPO status , Patient's Chart, lab work & pertinent test results  Airway Mallampati: II   Neck ROM: full    Dental   Pulmonary former smoker   breath sounds clear to auscultation       Cardiovascular hypertension, +CHF  + dysrhythmias Atrial Fibrillation  Rhythm:regular Rate:Normal     Neuro/Psych  Headaches PSYCHIATRIC DISORDERS Anxiety Depression       GI/Hepatic ,GERD  ,,  Endo/Other    Morbid obesity  Renal/GU      Musculoskeletal  (+) Arthritis ,    Abdominal   Peds  Hematology   Anesthesia Other Findings   Reproductive/Obstetrics                             Anesthesia Physical Anesthesia Plan  ASA: 3  Anesthesia Plan: General   Post-op Pain Management:    Induction: Intravenous  PONV Risk Score and Plan: 2 and Ondansetron, Dexamethasone, Midazolam and Treatment may vary due to age or medical condition  Airway Management Planned: Oral ETT  Additional Equipment: Arterial line  Intra-op Plan:   Post-operative Plan: Extubation in OR  Informed Consent: I have reviewed the patients History and Physical, chart, labs and discussed the procedure including the risks, benefits and alternatives for the proposed anesthesia with the patient or authorized representative who has indicated his/her understanding and acceptance.     Dental advisory given  Plan Discussed with: CRNA, Anesthesiologist and Surgeon  Anesthesia Plan Comments: (PAT note written 10/15/2022 by Myra Gianotti, PA-C.  )       Anesthesia Quick Evaluation

## 2022-10-18 ENCOUNTER — Inpatient Hospital Stay (HOSPITAL_COMMUNITY)
Admission: RE | Admit: 2022-10-18 | Discharge: 2022-10-21 | DRG: 229 | Disposition: A | Discharge status: Home / Self Care | Payer: 59 | Attending: Cardiothoracic Surgery | Admitting: Cardiothoracic Surgery

## 2022-10-18 ENCOUNTER — Encounter (HOSPITAL_COMMUNITY): Payer: Self-pay | Admitting: Cardiothoracic Surgery

## 2022-10-18 ENCOUNTER — Encounter (HOSPITAL_COMMUNITY)
Admission: RE | Disposition: A | Discharge status: Home / Self Care | Payer: Self-pay | Source: Home / Self Care | Attending: Cardiothoracic Surgery

## 2022-10-18 ENCOUNTER — Inpatient Hospital Stay (HOSPITAL_COMMUNITY): Payer: 59

## 2022-10-18 ENCOUNTER — Inpatient Hospital Stay (HOSPITAL_COMMUNITY): Payer: 59 | Admitting: Vascular Surgery

## 2022-10-18 ENCOUNTER — Other Ambulatory Visit: Payer: Self-pay

## 2022-10-18 ENCOUNTER — Inpatient Hospital Stay (HOSPITAL_COMMUNITY): Payer: 59 | Admitting: Certified Registered Nurse Anesthetist

## 2022-10-18 DIAGNOSIS — Z79899 Other long term (current) drug therapy: Secondary | ICD-10-CM

## 2022-10-18 DIAGNOSIS — I420 Dilated cardiomyopathy: Secondary | ICD-10-CM | POA: Diagnosis present

## 2022-10-18 DIAGNOSIS — Z833 Family history of diabetes mellitus: Secondary | ICD-10-CM | POA: Diagnosis not present

## 2022-10-18 DIAGNOSIS — Z6841 Body Mass Index (BMI) 40.0 and over, adult: Secondary | ICD-10-CM | POA: Diagnosis not present

## 2022-10-18 DIAGNOSIS — Z87891 Personal history of nicotine dependence: Secondary | ICD-10-CM | POA: Diagnosis not present

## 2022-10-18 DIAGNOSIS — Z885 Allergy status to narcotic agent status: Secondary | ICD-10-CM | POA: Diagnosis not present

## 2022-10-18 DIAGNOSIS — Z823 Family history of stroke: Secondary | ICD-10-CM | POA: Diagnosis not present

## 2022-10-18 DIAGNOSIS — I509 Heart failure, unspecified: Secondary | ICD-10-CM

## 2022-10-18 DIAGNOSIS — K760 Fatty (change of) liver, not elsewhere classified: Secondary | ICD-10-CM | POA: Diagnosis present

## 2022-10-18 DIAGNOSIS — I4819 Other persistent atrial fibrillation: Secondary | ICD-10-CM | POA: Diagnosis present

## 2022-10-18 DIAGNOSIS — I480 Paroxysmal atrial fibrillation: Secondary | ICD-10-CM

## 2022-10-18 DIAGNOSIS — I4891 Unspecified atrial fibrillation: Principal | ICD-10-CM | POA: Diagnosis present

## 2022-10-18 DIAGNOSIS — F418 Other specified anxiety disorders: Secondary | ICD-10-CM | POA: Diagnosis not present

## 2022-10-18 DIAGNOSIS — Z809 Family history of malignant neoplasm, unspecified: Secondary | ICD-10-CM | POA: Diagnosis not present

## 2022-10-18 DIAGNOSIS — I110 Hypertensive heart disease with heart failure: Secondary | ICD-10-CM | POA: Diagnosis present

## 2022-10-18 DIAGNOSIS — Z7901 Long term (current) use of anticoagulants: Secondary | ICD-10-CM | POA: Diagnosis not present

## 2022-10-18 DIAGNOSIS — I5022 Chronic systolic (congestive) heart failure: Secondary | ICD-10-CM | POA: Diagnosis present

## 2022-10-18 DIAGNOSIS — Z8249 Family history of ischemic heart disease and other diseases of the circulatory system: Secondary | ICD-10-CM

## 2022-10-18 DIAGNOSIS — M479 Spondylosis, unspecified: Secondary | ICD-10-CM | POA: Diagnosis present

## 2022-10-18 DIAGNOSIS — F419 Anxiety disorder, unspecified: Secondary | ICD-10-CM | POA: Diagnosis present

## 2022-10-18 HISTORY — PX: TEE WITHOUT CARDIOVERSION: SHX5443

## 2022-10-18 HISTORY — PX: CLIPPING OF ATRIAL APPENDAGE: SHX5773

## 2022-10-18 LAB — POCT I-STAT, CHEM 8
BUN: 12 mg/dL (ref 6–20)
Calcium, Ion: 1.22 mmol/L (ref 1.15–1.40)
Chloride: 106 mmol/L (ref 98–111)
Creatinine, Ser: 0.6 mg/dL — ABNORMAL LOW (ref 0.61–1.24)
Glucose, Bld: 101 mg/dL — ABNORMAL HIGH (ref 70–99)
HCT: 36 % — ABNORMAL LOW (ref 39.0–52.0)
Hemoglobin: 12.2 g/dL — ABNORMAL LOW (ref 13.0–17.0)
Potassium: 3.9 mmol/L (ref 3.5–5.1)
Sodium: 142 mmol/L (ref 135–145)
TCO2: 26 mmol/L (ref 22–32)

## 2022-10-18 LAB — ABO/RH: ABO/RH(D): A POS

## 2022-10-18 LAB — POCT I-STAT 7, (LYTES, BLD GAS, ICA,H+H)
Acid-base deficit: 2 mmol/L (ref 0.0–2.0)
Bicarbonate: 24.1 mmol/L (ref 20.0–28.0)
Calcium, Ion: 1.22 mmol/L (ref 1.15–1.40)
HCT: 37 % — ABNORMAL LOW (ref 39.0–52.0)
Hemoglobin: 12.6 g/dL — ABNORMAL LOW (ref 13.0–17.0)
O2 Saturation: 100 %
Potassium: 3.9 mmol/L (ref 3.5–5.1)
Sodium: 141 mmol/L (ref 135–145)
TCO2: 25 mmol/L (ref 22–32)
pCO2 arterial: 46.2 mmHg (ref 32–48)
pH, Arterial: 7.325 — ABNORMAL LOW (ref 7.35–7.45)
pO2, Arterial: 208 mmHg — ABNORMAL HIGH (ref 83–108)

## 2022-10-18 LAB — ECHO INTRAOPERATIVE TEE
Height: 72 in
Weight: 5088 oz

## 2022-10-18 SURGERY — MAZE PROCEDURE, CONVERGENT
Anesthesia: General

## 2022-10-18 MED ORDER — FENTANYL CITRATE (PF) 100 MCG/2ML IJ SOLN
INTRAMUSCULAR | Status: AC
  Filled 2022-10-18: qty 2

## 2022-10-18 MED ORDER — 0.9 % SODIUM CHLORIDE (POUR BTL) OPTIME
TOPICAL | Status: DC | PRN
  Administered 2022-10-18: 2000 mL

## 2022-10-18 MED ORDER — POLYETHYLENE GLYCOL 3350 17 G PO PACK
17.0000 g | PACK | Freq: Every day | ORAL | Status: DC
  Administered 2022-10-19 – 2022-10-21 (×3): 17 g via ORAL
  Filled 2022-10-18 (×3): qty 1

## 2022-10-18 MED ORDER — ACETAMINOPHEN 500 MG PO TABS
1000.0000 mg | ORAL_TABLET | Freq: Four times a day (QID) | ORAL | Status: DC
  Administered 2022-10-18 – 2022-10-21 (×11): 1000 mg via ORAL
  Filled 2022-10-18 (×11): qty 2

## 2022-10-18 MED ORDER — FENTANYL CITRATE (PF) 250 MCG/5ML IJ SOLN
INTRAMUSCULAR | Status: DC | PRN
  Administered 2022-10-18 (×3): 50 ug via INTRAVENOUS
  Administered 2022-10-18: 150 ug via INTRAVENOUS
  Administered 2022-10-18: 100 ug via INTRAVENOUS
  Administered 2022-10-18: 50 ug via INTRAVENOUS

## 2022-10-18 MED ORDER — NALOXONE HCL 0.4 MG/ML IJ SOLN: 0.4000 mg | INTRAMUSCULAR | Status: DC | PRN

## 2022-10-18 MED ORDER — BUPIVACAINE LIPOSOME 1.3 % IJ SUSP
INTRAMUSCULAR | Status: DC | PRN
  Administered 2022-10-18: 100 mL

## 2022-10-18 MED ORDER — HYDROCORTISONE NICU INJ SYRINGE 50 MG/ML: 40.0000 mg | Freq: Three times a day (TID) | INTRAVENOUS | Status: DC

## 2022-10-18 MED ORDER — ONDANSETRON HCL 4 MG/2ML IJ SOLN
INTRAMUSCULAR | Status: DC | PRN
  Administered 2022-10-18: 4 mg via INTRAVENOUS

## 2022-10-18 MED ORDER — CHLORHEXIDINE GLUCONATE CLOTH 2 % EX PADS
6.0000 | MEDICATED_PAD | Freq: Every day | CUTANEOUS | Status: DC
  Administered 2022-10-18: 6 via TOPICAL

## 2022-10-18 MED ORDER — ACETAMINOPHEN 160 MG/5ML PO SOLN: 1000.0000 mg | Freq: Four times a day (QID) | ORAL | Status: DC

## 2022-10-18 MED ORDER — PHENYLEPHRINE 80 MCG/ML (10ML) SYRINGE FOR IV PUSH (FOR BLOOD PRESSURE SUPPORT)
PREFILLED_SYRINGE | INTRAVENOUS | Status: AC
  Filled 2022-10-18: qty 10

## 2022-10-18 MED ORDER — CEFAZOLIN IN SODIUM CHLORIDE 3-0.9 GM/100ML-% IV SOLN
INTRAVENOUS | Status: AC
  Filled 2022-10-18: qty 100

## 2022-10-18 MED ORDER — OXYCODONE HCL 5 MG/5ML PO SOLN: 5.0000 mg | Freq: Once | ORAL | Status: DC | PRN

## 2022-10-18 MED ORDER — PROPOFOL 10 MG/ML IV BOLUS
INTRAVENOUS | Status: AC
  Filled 2022-10-18: qty 20

## 2022-10-18 MED ORDER — LACTATED RINGERS IV SOLN: INTRAVENOUS | Status: DC

## 2022-10-18 MED ORDER — MIDAZOLAM HCL 2 MG/2ML IJ SOLN
INTRAMUSCULAR | Status: DC | PRN
  Administered 2022-10-18: 2 mg via INTRAVENOUS

## 2022-10-18 MED ORDER — CHLORHEXIDINE GLUCONATE 0.12 % MT SOLN: 15.0000 mL | Freq: Once | OROMUCOSAL | Status: AC

## 2022-10-18 MED ORDER — SODIUM CHLORIDE 0.9 % IV SOLN: INTRAVENOUS | Status: DC

## 2022-10-18 MED ORDER — OXYCODONE HCL 5 MG PO TABS
5.0000 mg | ORAL_TABLET | ORAL | Status: DC | PRN
  Administered 2022-10-19 – 2022-10-20 (×4): 10 mg via ORAL
  Filled 2022-10-18 (×5): qty 2

## 2022-10-18 MED ORDER — DEXAMETHASONE SODIUM PHOSPHATE 10 MG/ML IJ SOLN
INTRAMUSCULAR | Status: AC
  Filled 2022-10-18: qty 1

## 2022-10-18 MED ORDER — COLCHICINE 0.6 MG PO TABS
0.6000 mg | ORAL_TABLET | Freq: Every day | ORAL | Status: DC
  Administered 2022-10-19 – 2022-10-21 (×3): 0.6 mg via ORAL
  Filled 2022-10-18 (×3): qty 1

## 2022-10-18 MED ORDER — DEXMEDETOMIDINE HCL IN NACL 80 MCG/20ML IV SOLN
INTRAVENOUS | Status: DC | PRN
  Administered 2022-10-18: 12 ug via BUCCAL

## 2022-10-18 MED ORDER — ONDANSETRON HCL 4 MG/2ML IJ SOLN
4.0000 mg | Freq: Four times a day (QID) | INTRAMUSCULAR | Status: DC | PRN
  Administered 2022-10-19 (×2): 4 mg via INTRAVENOUS
  Filled 2022-10-18 (×2): qty 2

## 2022-10-18 MED ORDER — HYDROMORPHONE 1 MG/ML IV SOLN
INTRAVENOUS | Status: DC
  Administered 2022-10-18: 1.25 mg via INTRAVENOUS
  Administered 2022-10-18: 30 mg via INTRAVENOUS
  Administered 2022-10-19: 2.4 mg via INTRAVENOUS
  Administered 2022-10-19: 5 mg via INTRAVENOUS
  Administered 2022-10-19: 2.1 mg via INTRAVENOUS
  Filled 2022-10-18: qty 30

## 2022-10-18 MED ORDER — MIDAZOLAM HCL 2 MG/2ML IJ SOLN
INTRAMUSCULAR | Status: AC
  Filled 2022-10-18: qty 2

## 2022-10-18 MED ORDER — DIPHENHYDRAMINE HCL 12.5 MG/5ML PO ELIX: 12.5000 mg | ORAL_SOLUTION | Freq: Four times a day (QID) | ORAL | Status: DC | PRN

## 2022-10-18 MED ORDER — ROCURONIUM BROMIDE 10 MG/ML (PF) SYRINGE
PREFILLED_SYRINGE | INTRAVENOUS | Status: AC
  Filled 2022-10-18: qty 30

## 2022-10-18 MED ORDER — DEXAMETHASONE SODIUM PHOSPHATE 10 MG/ML IJ SOLN
INTRAMUSCULAR | Status: DC | PRN
  Administered 2022-10-18: 8 mg via INTRAVENOUS

## 2022-10-18 MED ORDER — CEFAZOLIN IN SODIUM CHLORIDE 3-0.9 GM/100ML-% IV SOLN
3.0000 g | INTRAVENOUS | Status: AC
  Administered 2022-10-18: 3 g via INTRAVENOUS

## 2022-10-18 MED ORDER — LIDOCAINE 2% (20 MG/ML) 5 ML SYRINGE
INTRAMUSCULAR | Status: DC | PRN
  Administered 2022-10-18: 80 mg via INTRAVENOUS

## 2022-10-18 MED ORDER — DEXMEDETOMIDINE HCL IN NACL 80 MCG/20ML IV SOLN
INTRAVENOUS | Status: AC
  Filled 2022-10-18: qty 20

## 2022-10-18 MED ORDER — APIXABAN 5 MG PO TABS
5.0000 mg | ORAL_TABLET | Freq: Two times a day (BID) | ORAL | Status: DC
  Administered 2022-10-18 – 2022-10-21 (×6): 5 mg via ORAL
  Filled 2022-10-18 (×6): qty 1

## 2022-10-18 MED ORDER — ONDANSETRON HCL 4 MG/2ML IJ SOLN: 4.0000 mg | Freq: Four times a day (QID) | INTRAMUSCULAR | Status: DC | PRN

## 2022-10-18 MED ORDER — SUGAMMADEX SODIUM 200 MG/2ML IV SOLN
INTRAVENOUS | Status: DC | PRN
  Administered 2022-10-18: 400 mg via INTRAVENOUS

## 2022-10-18 MED ORDER — BISACODYL 5 MG PO TBEC
10.0000 mg | DELAYED_RELEASE_TABLET | Freq: Every day | ORAL | Status: DC
  Administered 2022-10-18 – 2022-10-21 (×4): 10 mg via ORAL
  Filled 2022-10-18 (×4): qty 2

## 2022-10-18 MED ORDER — PANTOPRAZOLE SODIUM 40 MG PO TBEC
40.0000 mg | DELAYED_RELEASE_TABLET | Freq: Every day | ORAL | Status: DC
  Administered 2022-10-19 – 2022-10-21 (×3): 40 mg via ORAL
  Filled 2022-10-18 (×3): qty 1

## 2022-10-18 MED ORDER — FENTANYL CITRATE (PF) 100 MCG/2ML IJ SOLN
25.0000 ug | INTRAMUSCULAR | Status: DC | PRN
  Administered 2022-10-18: 50 ug via INTRAVENOUS
  Administered 2022-10-18: 25 ug via INTRAVENOUS

## 2022-10-18 MED ORDER — DIPHENHYDRAMINE HCL 50 MG/ML IJ SOLN: 12.5000 mg | Freq: Four times a day (QID) | INTRAMUSCULAR | Status: DC | PRN

## 2022-10-18 MED ORDER — PHENYLEPHRINE HCL-NACL 20-0.9 MG/250ML-% IV SOLN
INTRAVENOUS | Status: DC | PRN
  Administered 2022-10-18: 20 ug/min via INTRAVENOUS

## 2022-10-18 MED ORDER — PROPOFOL 10 MG/ML IV BOLUS
INTRAVENOUS | Status: DC | PRN
  Administered 2022-10-18: 200 mg via INTRAVENOUS

## 2022-10-18 MED ORDER — VANCOMYCIN HCL 1000 MG IV SOLR
INTRAVENOUS | Status: AC
  Filled 2022-10-18: qty 20

## 2022-10-18 MED ORDER — SODIUM CHLORIDE 0.9% FLUSH: 9.0000 mL | INTRAVENOUS | Status: DC | PRN

## 2022-10-18 MED ORDER — ROCURONIUM BROMIDE 10 MG/ML (PF) SYRINGE
PREFILLED_SYRINGE | INTRAVENOUS | Status: DC | PRN
  Administered 2022-10-18: 20 mg via INTRAVENOUS
  Administered 2022-10-18: 60 mg via INTRAVENOUS
  Administered 2022-10-18: 20 mg via INTRAVENOUS
  Administered 2022-10-18: 30 mg via INTRAVENOUS
  Administered 2022-10-18: 50 mg via INTRAVENOUS

## 2022-10-18 MED ORDER — HYDROCORTISONE SOD SUC (PF) 100 MG IJ SOLR
40.0000 mg | Freq: Three times a day (TID) | INTRAMUSCULAR | Status: AC
  Administered 2022-10-18 – 2022-10-19 (×3): 40 mg via INTRAVENOUS
  Filled 2022-10-18 (×3): qty 0.8

## 2022-10-18 MED ORDER — CEFAZOLIN SODIUM-DEXTROSE 2-4 GM/100ML-% IV SOLN
2.0000 g | Freq: Three times a day (TID) | INTRAVENOUS | Status: AC
  Administered 2022-10-18 – 2022-10-19 (×2): 2 g via INTRAVENOUS
  Filled 2022-10-18 (×2): qty 100

## 2022-10-18 MED ORDER — LIDOCAINE 2% (20 MG/ML) 5 ML SYRINGE
INTRAMUSCULAR | Status: AC
  Filled 2022-10-18: qty 15

## 2022-10-18 MED ORDER — LACTATED RINGERS IV SOLN: INTRAVENOUS | Status: DC | PRN

## 2022-10-18 MED ORDER — FENTANYL CITRATE (PF) 250 MCG/5ML IJ SOLN
INTRAMUSCULAR | Status: AC
  Filled 2022-10-18: qty 5

## 2022-10-18 MED ORDER — HYDRALAZINE HCL 20 MG/ML IJ SOLN: 10.0000 mg | Freq: Four times a day (QID) | INTRAMUSCULAR | Status: DC | PRN

## 2022-10-18 MED ORDER — ONDANSETRON HCL 4 MG/2ML IJ SOLN
INTRAMUSCULAR | Status: AC
  Filled 2022-10-18: qty 2

## 2022-10-18 MED ORDER — ROCURONIUM BROMIDE 10 MG/ML (PF) SYRINGE
PREFILLED_SYRINGE | INTRAVENOUS | Status: AC
  Filled 2022-10-18: qty 10

## 2022-10-18 MED ORDER — VANCOMYCIN HCL 1000 MG IV SOLR: INTRAVENOUS | Status: DC | PRN

## 2022-10-18 MED ORDER — CHLORHEXIDINE GLUCONATE 0.12 % MT SOLN
OROMUCOSAL | Status: AC
  Administered 2022-10-18: 15 mL via OROMUCOSAL
  Filled 2022-10-18: qty 15

## 2022-10-18 MED ORDER — BUPIVACAINE HCL (PF) 0.5 % IJ SOLN
INTRAMUSCULAR | Status: AC
  Filled 2022-10-18: qty 30

## 2022-10-18 MED ORDER — POLYETHYLENE GLYCOL 3350 17 G PO PACK: 17.0000 g | PACK | Freq: Every day | ORAL | Status: DC | PRN

## 2022-10-18 MED ORDER — ORAL CARE MOUTH RINSE: 15.0000 mL | Freq: Once | OROMUCOSAL | Status: AC

## 2022-10-18 MED ORDER — OXYCODONE HCL 5 MG PO TABS: 5.0000 mg | ORAL_TABLET | Freq: Once | ORAL | Status: DC | PRN

## 2022-10-18 SURGICAL SUPPLY — 68 items
ARTICLIP LAA PROCLIP II 45 (Clip) ×1 IMPLANT
BLADE CLIPPER SURG (BLADE) ×1 IMPLANT
BLADE STERNUM SYSTEM 6 (BLADE) IMPLANT
BULB SUCTION JP 400 (SUCTIONS) IMPLANT
CANISTER SUCT 3000ML PPV (MISCELLANEOUS) ×1 IMPLANT
CATH S-M CIRCA TEMP PROBE (CATHETERS) IMPLANT
CNTNR URN SCR LID CUP LEK RST (MISCELLANEOUS) ×1 IMPLANT
CONT SPEC 4OZ STRL OR WHT (MISCELLANEOUS) ×2
DERMABOND ADVANCED .7 DNX12 (GAUZE/BANDAGES/DRESSINGS) IMPLANT
DEVICE ATRICLIP LAA PRCLPII 45 (Clip) IMPLANT
DEVICE COAG EPI SENSE (MISCELLANEOUS) IMPLANT
DISSECTOR BLUNT TIP ENDO 5MM (MISCELLANEOUS) ×3 IMPLANT
DRAIN CHANNEL 10F 3/8 F FF (DRAIN) ×1 IMPLANT
DRAIN CHANNEL 19F RND (DRAIN) IMPLANT
DRAIN CHANNEL 24FR (DRAIN) IMPLANT
DRAPE CV SPLIT W-CLR ANES SCRN (DRAPES) ×1 IMPLANT
DRAPE INCISE IOBAN 66X45 STRL (DRAPES) IMPLANT
DRAPE PERI GROIN 82X75IN TIB (DRAPES) ×1 IMPLANT
DRAPE SPACE STATION 30X60X34 (DRAPES) IMPLANT
ELECT REM PT RETURN 9FT ADLT (ELECTROSURGICAL) ×1
ELECTRODE REM PT RTRN 9FT ADLT (ELECTROSURGICAL) ×1 IMPLANT
GAUZE SPONGE 4X4 12PLY STRL (GAUZE/BANDAGES/DRESSINGS) ×1 IMPLANT
GLOVE BIO SURGEON STRL SZ 6 (GLOVE) IMPLANT
GLOVE BIO SURGEON STRL SZ 6.5 (GLOVE) IMPLANT
GLOVE BIO SURGEON STRL SZ8 (GLOVE) ×2 IMPLANT
GOWN STRL REUS W/ TWL LRG LVL3 (GOWN DISPOSABLE) ×3 IMPLANT
GOWN STRL REUS W/ TWL XL LVL3 (GOWN DISPOSABLE) ×3 IMPLANT
GOWN STRL REUS W/TWL LRG LVL3 (GOWN DISPOSABLE) ×4
GOWN STRL REUS W/TWL XL LVL3 (GOWN DISPOSABLE) ×4
IV NS 1000ML (IV SOLUTION) ×1
IV NS 1000ML BAXH (IV SOLUTION) ×1 IMPLANT
IV NS 500ML (IV SOLUTION) ×1
IV NS 500ML BAXH (IV SOLUTION) ×1 IMPLANT
IV SET EXTENSION CATH 6 NF (IV SETS) ×1 IMPLANT
KIT BASIN OR (CUSTOM PROCEDURE TRAY) ×1 IMPLANT
KIT TURNOVER KIT B (KITS) ×1 IMPLANT
LIGASURE LAP MARYLAND 5MM 37CM (ELECTROSURGICAL) ×1 IMPLANT
NDL SPNL 22GX3.5 QUINCKE BK (NEEDLE) IMPLANT
NEEDLE SPNL 22GX3.5 QUINCKE BK (NEEDLE) ×1 IMPLANT
NS IRRIG 1000ML POUR BTL (IV SOLUTION) ×4 IMPLANT
PACK CHEST (CUSTOM PROCEDURE TRAY) ×1 IMPLANT
PAD ARMBOARD 7.5X6 YLW CONV (MISCELLANEOUS) ×2 IMPLANT
PAD ELECT DEFIB RADIOL ZOLL (MISCELLANEOUS) ×1 IMPLANT
SCISSORS LAP 5X35 DISP (ENDOMECHANICALS) IMPLANT
SEALER LIGASURE MARYLAND 30 (ELECTROSURGICAL) IMPLANT
SLEEVE ADV FIXATION 5X100MM (TROCAR) ×2 IMPLANT
SUT MNCRL AB 3-0 PS2 27 (SUTURE) ×1 IMPLANT
SUT SILK  1 MH (SUTURE) ×3
SUT SILK 1 MH (SUTURE) ×2 IMPLANT
SUT VIC AB 0 CT1 18XCR BRD 8 (SUTURE) ×1 IMPLANT
SUT VIC AB 0 CT1 8-18 (SUTURE) ×1
SUT VIC AB 1 CTX 18 (SUTURE) IMPLANT
SUT VIC AB 2-0 CT1 27 (SUTURE)
SUT VIC AB 2-0 CT1 TAPERPNT 27 (SUTURE) IMPLANT
SYR 10ML LL (SYRINGE) IMPLANT
SYSTEM SAHARA CHEST DRAIN ATS (WOUND CARE) IMPLANT
TAPE CLOTH SURG 4X10 WHT LF (GAUZE/BANDAGES/DRESSINGS) IMPLANT
TOWEL GREEN STERILE (TOWEL DISPOSABLE) ×1 IMPLANT
TOWEL GREEN STERILE FF (TOWEL DISPOSABLE) ×1 IMPLANT
TRAY FOLEY SLVR 16FR TEMP STAT (SET/KITS/TRAYS/PACK) ×1 IMPLANT
TROCAR 5M 150ML BLDLS (TROCAR) IMPLANT
TROCAR ADV FIXATION 5X100MM (TROCAR) ×1 IMPLANT
TROCAR BALLN 12MMX100 BLUNT (TROCAR) ×1 IMPLANT
TROCAR BALLN GELPORT 12X130M (ENDOMECHANICALS) ×1 IMPLANT
TUBE CONNECTING 20X1/4 (TUBING) ×2 IMPLANT
TUBING EXTENTION W/L.L. (IV SETS) IMPLANT
TUBING LAP HI FLOW INSUFFLATIO (TUBING) ×1 IMPLANT
WATER STERILE IRR 1000ML POUR (IV SOLUTION) ×2 IMPLANT

## 2022-10-18 NOTE — Plan of Care (Signed)
  Problem: Education: Goal: Knowledge of disease or condition will improve Outcome: Progressing Goal: Knowledge of the prescribed therapeutic regimen will improve Outcome: Progressing   Problem: Activity: Goal: Risk for activity intolerance will decrease Outcome: Progressing   Problem: Cardiac: Goal: Will achieve and/or maintain hemodynamic stability Outcome: Progressing   Problem: Clinical Measurements: Goal: Postoperative complications will be avoided or minimized Outcome: Progressing   Problem: Respiratory: Goal: Respiratory status will improve Outcome: Progressing   Problem: Pain Management: Goal: Pain level will decrease Outcome: Progressing   Problem: Skin Integrity: Goal: Wound healing without signs and symptoms infection will improve Outcome: Progressing   Problem: Education: Goal: Knowledge of General Education information will improve Description: Including pain rating scale, medication(s)/side effects and non-pharmacologic comfort measures Outcome: Progressing   Problem: Health Behavior/Discharge Planning: Goal: Ability to manage health-related needs will improve Outcome: Progressing   Problem: Clinical Measurements: Goal: Ability to maintain clinical measurements within normal limits will improve Outcome: Progressing Goal: Will remain free from infection Outcome: Progressing Goal: Diagnostic test results will improve Outcome: Progressing Goal: Respiratory complications will improve Outcome: Progressing Goal: Cardiovascular complication will be avoided Outcome: Progressing   Problem: Activity: Goal: Risk for activity intolerance will decrease Outcome: Progressing   Problem: Nutrition: Goal: Adequate nutrition will be maintained Outcome: Progressing   Problem: Coping: Goal: Level of anxiety will decrease Outcome: Progressing   Problem: Elimination: Goal: Will not experience complications related to bowel motility Outcome: Progressing Goal:  Will not experience complications related to urinary retention Outcome: Progressing   Problem: Safety: Goal: Ability to remain free from injury will improve Outcome: Progressing

## 2022-10-18 NOTE — Transfer of Care (Signed)
Immediate Anesthesia Transfer of Care Note  Patient: Nathaniel [name]  Procedure(s) Performed: CONVERGENT PROCEDURE CLIPPING OF LEFT ATRIAL APPENDAGE TRANSESOPHAGEAL ECHOCARDIOGRAM  Patient Location: PACU  Anesthesia Type:General  Level of Consciousness: awake and alert   Airway & Oxygen Therapy: Patient Spontanous Breathing and Patient connected to face mask oxygen  Post-op Assessment: Report given to RN and Post -op Vital signs reviewed and stable  Post vital signs: Reviewed and stable  Last Vitals:  Vitals Value Taken Time  BP 83/41 10/18/22 1758  Temp    Pulse 78 10/18/22 1800  Resp 12 10/18/22 1800  SpO2 93 % 10/18/22 1800  Vitals shown include unvalidated device data.  Last Pain:  Vitals:   10/18/22 1000  TempSrc:   PainSc: 0-No pain         Complications: No notable events documented.

## 2022-10-18 NOTE — Anesthesia Procedure Notes (Signed)
Arterial Line Insertion Start/End2/19/2024 11:05 AM Performed by: Carolan Clines, CRNA, CRNA  Patient location: Pre-op. Preanesthetic checklist: patient identified, IV checked, site marked, risks and benefits discussed, surgical consent, monitors and equipment checked, pre-op evaluation, timeout performed and anesthesia consent Lidocaine 1% used for infiltration Right, radial was placed Catheter size: 20 G Hand hygiene performed  and maximum sterile barriers used   Attempts: 1 Procedure performed without using ultrasound guided technique. Following insertion, dressing applied and Biopatch. Post procedure assessment: normal and unchanged  Patient tolerated the procedure well with no immediate complications.

## 2022-10-18 NOTE — Brief Op Note (Signed)
10/18/2022  2:39 PM  PATIENT:  Nathaniel [name]  32 y.o. male  PRE-OPERATIVE DIAGNOSIS:  ATRIAL FIBRILLATION  POST-OPERATIVE DIAGNOSIS:  ATRIAL FIBRILLATION  PROCEDURE:   CONVERGENT PROCEDURE CLIPPING OF LEFT ATRIAL APPENDAGE  TRANSESOPHAGEAL ECHOCARDIOGRAM   SURGEON:   Enter, Pierre Bali, MD - Primary  PHYSICIAN ASSISTANT Kaari Zeigler  ASSISTANTS: Bebe Liter, RN, Jolly Mango, RN, RN First Assistant   ANESTHESIA:   general  EBL: 64m  BLOOD ADMINISTERED:none  LOCAL MEDICATIONS USED:  Exparel   COUNTS:  Correct  DICTATION: .Dragon Dictation  PLAN OF CARE: Admit to inpatient   PATIENT DISPOSITION:  PACU - hemodynamically stable.   Delay start of Pharmacological VTE agent (>24hrs) due to surgical blood loss or risk of bleeding: yes

## 2022-10-18 NOTE — Anesthesia Procedure Notes (Signed)
Procedure Name: Intubation Date/Time: 10/18/2022 1:38 PM  Performed by: Inda Coke, CRNAPre-anesthesia Checklist: Patient identified, Emergency Drugs available, Suction available, Timeout performed and Patient being monitored Patient Re-evaluated:Patient Re-evaluated prior to induction Oxygen Delivery Method: Circle system utilized Preoxygenation: Pre-oxygenation with 100% oxygen Induction Type: IV induction Ventilation: Mask ventilation without difficulty and Oral airway inserted - appropriate to patient size Laryngoscope Size: Mac and 4 Grade View: Grade I Tube type: Oral Endobronchial tube: Left, Double lumen EBT, EBT position confirmed by fiberoptic bronchoscope and EBT position confirmed by auscultation and 41 Fr Number of attempts: 1 Airway Equipment and Method: Stylet Placement Confirmation: ETT inserted through vocal cords under direct vision, positive ETCO2, CO2 detector and breath sounds checked- equal and bilateral Secured at: 31 cm Tube secured with: Tape Dental Injury: Teeth and Oropharynx as per pre-operative assessment

## 2022-10-18 NOTE — H&P (Signed)
Plan for surgery today  Arterial line will be right sided  Double lumen ETT       [address]       [place] [address]             [phone]                                                   Jebediah Mitchum [facility] Record [id number] Date of Birth: 1990-12-05   Referring: Oliver Barre, PA Primary Care: Neale Burly, MD Primary Cardiologist: Vickie Epley, MD   Chief Complaint:           Chief Complaint  Patient presents with   Atrial Fibrillation      Surgical consult      History of Present Illness:    Nathaniel [name] 32 y.o. male with a history of persistent AF. Back in 2017 had EF of 35%. Started on amiodarone.    Had a catheter ablation in 2017.  Got 2 years from 1st ablation   Redo catheter ablation 2021 Back in af immediately after 2nd ablation   In and out of AF all the time. Feels a lot worse in AF.   Pancreast divisum. Annuluas panceas.  Can't get to cyst  Gallbladder out in June lst year. Only 1 pancreatitis since then.  Treated with fluids and pain  meds and 2-3 day stay in the past. No stay more than 4 days, not intubation.    Dilauidid works better than morphine.           Past Medical History:  Diagnosis Date   Acute pancreatitis without infection or necrosis 03/05/2016   Atrial fibrillation (HCC)     Atrial flutter ([facility]) 01/17/2016    Admx 5/17 with AFlutter with RVR in setting of pancreatitis // CHADS2-VASc=1 (CHF) // Anticoag not started // Amiodarone >> NSR    Cardiomyopathy (HCC)     CHF (congestive heart failure) (HCC)     Chronic lower back pain     Chronic systolic CHF (congestive heart failure) ([facility])      Echo 01/17/16 - EF 35-40%, diffuse HK, mild LAE    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[facility]) 02/03/2016    a. EF 35-40% in setting of AFlutter with RVR in 5/17 // probably tachycardia mediated // needs FU echo 2-3 mos after NSR restored // b. Limited Echo 7/17: EF 45-50%, diff HK, mild LAE   Depression       "last 2-3 months; never treated for it" (12/09/2016)   Fatty liver 12/2015    per CT   Gastroenteritis 06/05/2017   Headache      "monthly" (12/09/2016)   Obesity      BMI 45 12/2015   Pancreatic pseudocyst     Pancreatitis 2012    recurrent acute. Admissions to [facility], 12/2015 admissin to [facility], ct then shows pseudocyst.    Pancreatitis 2012 X 2; 2017 X 2   Pancreatitis 03/05/2016   Paroxysmal atrial fibrillation (HCC)     Typical atrial flutter ([facility]) 06/06/2017               Past Surgical History:  Procedure Laterality Date   ATRIAL FIBRILLATION ABLATION N/A 12/09/2016    Procedure: Atrial Fibrillation Ablation;  Surgeon: Thompson Grayer, MD;  Location: [facility];  Service: Cardiovascular;  Laterality: N/A;   ATRIAL FIBRILLATION ABLATION N/A 11/09/2019    Procedure: ATRIAL FIBRILLATION ABLATION;  Surgeon: Thompson Grayer, MD;  Location: [facility];  Service: Cardiovascular;  Laterality: N/A;   FRACTURE SURGERY       HAND SURGERY Right ~ 2008    "had pins in it; pulled them"   TEE WITHOUT CARDIOVERSION N/A 12/08/2016    Procedure: TRANSESOPHAGEAL ECHOCARDIOGRAM (TEE);  Surgeon: Jerline Pain, MD;  Location: [facility];  Service: Cardiovascular;  Laterality: N/A;   TONSILLECTOMY AND ADENOIDECTOMY        "in my teens"               Family History  Problem Relation Age of Onset   Diabetes Mother     Gallbladder disease Mother     Hypertension Father     Supraventricular tachycardia Brother     Cancer Maternal Grandmother     Cancer Maternal Grandfather     Cirrhosis Maternal Grandfather     Stroke Paternal Grandmother     Celiac disease Neg Hx     Breast cancer Neg Hx     Cholelithiasis Neg Hx     Colon cancer Neg Hx     Colon polyps Neg Hx     Crohn's disease Neg Hx     Esophageal cancer Neg Hx     Hepatitis Neg Hx     Inflammatory bowel disease Neg Hx     Liver disease Neg Hx     Pancreatitis Neg Hx     Stomach cancer Neg Hx     Thyroid disease Neg  Hx     Ulcerative colitis Neg Hx          Social History           Tobacco Use  Smoking Status Former   Packs/day: 0.50   Types: Cigarettes  Smokeless Tobacco Current   Types: Snuff  Tobacco Comments    Nicotine patches sent 07/12/22    Social History           Substance and Sexual Activity  Alcohol Use Not Currently    Comment: last drink 2018               Allergies  Allergen Reactions   Morphine Nausea And Vomiting                 Current Outpatient Medications  Medication Sig Dispense Refill   acetaminophen (TYLENOL) 325 MG tablet Take 650 mg by mouth as needed.       clonazePAM (KLONOPIN) 0.5 MG tablet Take 0.5 tablets (0.25 mg total) by mouth 2 (two) times daily. 30 tablet 0   cyanocobalamin (VITAMIN B12) 500 MCG tablet Take 500 mcg by mouth daily.       diltiazem (CARDIZEM CD) 240 MG 24 hr capsule TAKE ONE CAPSULE BY MOUTH EVERY DAY 90 capsule 1   diltiazem (CARDIZEM) 30 MG tablet Take 1 tablet every 4 hours AS NEEDED for heart rate >100 30 tablet 1   dofetilide (TIKOSYN) 500 MCG capsule Take 1 capsule (500 mcg total) by mouth 2 (two) times daily. 30 capsule 2   ELIQUIS 5 MG TABS tablet TAKE 1 TABLET BY MOUTH TWICE DAILY 60 tablet 5   metoprolol succinate (TOPROL XL) 25 MG 24 hr tablet Take 1 tablet (25 mg total) by mouth at bedtime. 30 tablet 3   Multiple Vitamin (ONE-A-DAY MENS PO) Take 1 tablet by mouth every morning.  nicotine (NICODERM CQ - DOSED IN MG/24 HOURS) 21 mg/24hr patch Place 1 patch (21 mg total) onto the skin daily. (Patient not taking: Reported on 07/16/2022) 28 patch 0   pantoprazole (PROTONIX) 40 MG tablet TAKE 1 TABLET(40 MG) BY MOUTH DAILY (Patient taking differently: Take 40 mg by mouth daily.) 90 tablet 3   sertraline (ZOLOFT) 25 MG tablet Take 1.5 tablets (37.5 mg total) by mouth daily. TAKE 1 AND 1/2 TABLETS BY MOUTH EVERY DAY 45 tablet 0   triamcinolone cream (KENALOG) 0.1 % Apply 1 application topically daily as needed (for  irritation).         No current facility-administered medications for this visit.      ROS 14 point ROS reviewed and negative except as per HPI     PHYSICAL EXAMINATION: BP 128/80   Pulse 76   Resp 20   Ht 6' (1.829 m)   Wt (!) 316 lb (143.3 kg)   SpO2 97% Comment: RA  BMI 42.86 kg/m    Gen: NAD Neuro: Alert and oriented CV: irreg irreg rhythm Resp: Nonlaboured Abd: Soft, ntnd Extr: WWP   Diagnostic Studies & Laboratory data:     Recent Radiology Findings:    Imaging Results  ECHOCARDIOGRAM COMPLETE   Result Date: 08/11/2022    ECHOCARDIOGRAM REPORT   Patient Name:   Nathaniel [name] Date of Exam: 08/11/2022 Medical Rec #:  [id number]         Height:       72.0 in Accession #:    [id number]        Weight:       316.0 lb Date of Birth:  07-31-1991          BSA:          2.588 m Patient Age:    59 years          BP:           131/86 mmHg Patient Gender: M                 HR:           70 bpm. Exam Location:  [facility] Procedure: 2D Echo, Cardiac Doppler and Color Doppler Indications:    I48.0 (ICD-10-CM) - Paroxysmal atrial fibrillation  History:        Patient has prior history of Echocardiogram examinations, most                 recent 01/25/2019. Arrythmias:Atrial Flutter and Atrial                 Fibrillation; Risk Factors:Former Smoker and Hypertension.                 Morbid Obesity, Cardiomyopathy Ablation 12/09/16.  Sonographer:    Alvino Chapel RCS Referring Phys: [id number] [place]  1. Left ventricular ejection fraction, by estimation, is 50 to 55%. The left ventricle has low normal function. The left ventricle has no regional wall motion abnormalities. The left ventricular internal cavity size was mildly dilated. Left ventricular diastolic parameters were normal.  2. Right ventricular systolic function is normal. The right ventricular size is normal. Tricuspid regurgitation signal is inadequate for assessing PA pressure.  3. Left atrial size was upper normal.   4. Right atrial size was mildly dilated.  5. The mitral valve is grossly normal. Trivial mitral valve regurgitation.  6. The aortic valve is tricuspid. Aortic valve regurgitation is not visualized.  7. The inferior vena  cava is normal in size with <50% respiratory variability, suggesting right atrial pressure of 8 mmHg. Comparison(s): Prior images reviewed side by side. LVEF low normal range at 50-55%. Upper normal left atrial chamber size and mildly dilated right atirum. FINDINGS  Left Ventricle: Left ventricular ejection fraction, by estimation, is 50 to 55%. The left ventricle has low normal function. The left ventricle has no regional wall motion abnormalities. The left ventricular internal cavity size was mildly dilated. There is no left ventricular hypertrophy. Left ventricular diastolic parameters were normal. Right Ventricle: The right ventricular size is normal. No increase in right ventricular wall thickness. Right ventricular systolic function is normal. Tricuspid regurgitation signal is inadequate for assessing PA pressure. Left Atrium: Left atrial size was upper normal. Right Atrium: Right atrial size was mildly dilated. Pericardium: There is no evidence of pericardial effusion. Mitral Valve: The mitral valve is grossly normal. Trivial mitral valve regurgitation. Tricuspid Valve: The tricuspid valve is grossly normal. Tricuspid valve regurgitation is trivial. Aortic Valve: The aortic valve is tricuspid. Aortic valve regurgitation is not visualized. Pulmonic Valve: The pulmonic valve was grossly normal. Pulmonic valve regurgitation is trivial. Aorta: The aortic root is normal in size and structure. Venous: The inferior vena cava is normal in size with less than 50% respiratory variability, suggesting right atrial pressure of 8 mmHg. IAS/Shunts: No atrial level shunt detected by color flow Doppler.  LEFT VENTRICLE PLAX 2D LVIDd:         5.80 cm      Diastology LVIDs:         4.10 cm      LV e' medial:     9.46 cm/s LV PW:         0.90 cm      LV E/e' medial:  8.2 LV IVS:        0.90 cm      LV e' lateral:   13.30 cm/s LVOT diam:     2.40 cm      LV E/e' lateral: 5.8 LV SV:         97 LV SV Index:   38 LVOT Area:     4.52 cm  LV Volumes (MOD) LV vol d, MOD A2C: 170.0 ml LV vol d, MOD A4C: 181.0 ml LV vol s, MOD A2C: 75.1 ml LV vol s, MOD A4C: 83.2 ml LV SV MOD A2C:     94.9 ml LV SV MOD A4C:     181.0 ml LV SV MOD BP:      96.5 ml RIGHT VENTRICLE RV S prime:     11.70 cm/s TAPSE (M-mode): 2.6 cm LEFT ATRIUM             Index        RIGHT ATRIUM           Index LA diam:        5.00 cm 1.93 cm/m   RA Area:     27.70 cm LA Vol (A2C):   98.2 ml 37.95 ml/m  RA Volume:   101.00 ml 39.03 ml/m LA Vol (A4C):   82.1 ml 31.73 ml/m LA Biplane Vol: 91.4 ml 35.32 ml/m  AORTIC VALVE LVOT Vmax:   90.90 cm/s LVOT Vmean:  64.000 cm/s LVOT VTI:    0.215 m  AORTA Ao Root diam: 3.90 cm MITRAL VALVE MV Area (PHT): 3.12 cm    SHUNTS MV Decel Time: 243 msec    Systemic VTI:  0.22 m MV E velocity: 77.10 cm/s  Systemic Diam: 2.40 cm MV A velocity: 44.20 cm/s MV E/A ratio:  1.74 Rozann Lesches MD Electronically signed by Rozann Lesches MD Signature Date/Time: 08/11/2022/3:50:36 PM    Final             I have independently reviewed the above radiology studies  and reviewed the findings with the patient.    Recent Lab Findings: Recent Labs           Lab Results  Component Value Date    WBC 6.4 11/06/2019    HGB 14.0 11/06/2019    HCT 42.8 11/06/2019    PLT 274 11/06/2019    GLUCOSE 101 (H) 07/16/2022    CHOL 116 05/11/2018    TRIG 61 05/11/2018    HDL 31 (L) 05/11/2018    LDLCALC 73 05/11/2018    ALT 19 05/11/2018    AST 17 05/11/2018    NA 142 07/16/2022    K 3.9 07/16/2022    CL 105 07/16/2022    CREATININE 0.77 07/16/2022    BUN 10 07/16/2022    CO2 25 07/16/2022    TSH 2.70 03/04/2016    INR 1.22 01/18/2016      TTE 12/13 EF low normal:    IMPRESSIONS     1. Left ventricular ejection fraction,  by estimation, is 50 to 55%. The  left ventricle has low normal function. The left ventricle has no regional  wall motion abnormalities. The left ventricular internal cavity size was  mildly dilated. Left ventricular  diastolic parameters were normal.   2. Right ventricular systolic function is normal. The right ventricular  size is normal. Tricuspid regurgitation signal is inadequate for assessing  PA pressure.   3. Left atrial size was upper normal.   4. Right atrial size was mildly dilated.   5. The mitral valve is grossly normal. Trivial mitral valve  regurgitation.   6. The aortic valve is tricuspid. Aortic valve regurgitation is not  visualized.   7. The inferior vena cava is normal in size with <50% respiratory  variability, suggesting right atrial pressure of 8 mmHg.    Assessment / Plan:   63M with persistent AF s/p 2 catheter ablations. Feels sig worse in AF.    Most sig other medical issue is hx of pancreatitis -  Pancreast divisum. Annuluas panceas.  Can't get to cyst  Gallbladder out in June lst year. Only 1 pancreatitis since then.  Treated with fluids and pain  meds and 2-3 day stay in the past. No stay more than 4 days, not intubation.    I told him we are getting started with Convergent here and he would be on of the initial patients. I offered him procedure to be later if our experience here if that's what he prefers. It seems that he wants to think about and likely move forward in March. AF is essentially disabling for him, inter-related with anxiety, and making it hard to move forward in life.   Typical workup includes: Coronary eval for patients > 26 yo. - N/A as young patient Echo in the last year - completed 08/11/22     We broadly discussed the rationale for the convergent approach.  Standard recovery is walking around day after procedure, discharge to home postop day 2.  I see convergent (epicardial ablation) patients postoperatively back in the clinic in 1  week and 1 month. Patients generally have a full recovery by 3 weeks.    Risks/benefits/alternatives discussed at length. All questions were  asked an answered. Risks are <<1% mortality, 2% morbidity (bleeding, infection, damage to surrounding structures, pancreatic issues and prolonged stay given hx recurrent pancreatitis) and >97% standard recovery.   Typically I do these procedures on Mondays, last dose of Eliquis is Saturday morning. I restart AC the night of surgery. If they receive an LAA clip, most patients, but not all, come off their AC 2-6 months after surgery at the discretion of their referring cardiologist. Many ultimately have reductions in the degree of antiarrythmic medications as well. Expected outcome is significantly more freedom from Afib (measured by 90% reduction of time in AF).   Discuss moving forward possible Convergent/LAA clip in March 2024     [place]

## 2022-10-18 NOTE — Progress Notes (Signed)
  Echocardiogram Echocardiogram Transesophageal has been performed.  Johny Chess 10/18/2022, 1:49 PM

## 2022-10-19 ENCOUNTER — Telehealth (HOSPITAL_COMMUNITY): Payer: Self-pay

## 2022-10-19 ENCOUNTER — Other Ambulatory Visit (HOSPITAL_COMMUNITY): Payer: Self-pay

## 2022-10-19 ENCOUNTER — Encounter (HOSPITAL_COMMUNITY): Payer: Self-pay | Admitting: Cardiothoracic Surgery

## 2022-10-19 ENCOUNTER — Inpatient Hospital Stay (HOSPITAL_COMMUNITY): Payer: 59

## 2022-10-19 DIAGNOSIS — I4819 Other persistent atrial fibrillation: Secondary | ICD-10-CM | POA: Diagnosis not present

## 2022-10-19 LAB — CBC
HCT: 42.5 % (ref 39.0–52.0)
Hemoglobin: 13.8 g/dL (ref 13.0–17.0)
MCH: 29.4 pg (ref 26.0–34.0)
MCHC: 32.5 g/dL (ref 30.0–36.0)
MCV: 90.4 fL (ref 80.0–100.0)
Platelets: 309 10*3/uL (ref 150–400)
RBC: 4.7 MIL/uL (ref 4.22–5.81)
RDW: 12.7 % (ref 11.5–15.5)
WBC: 17.9 10*3/uL — ABNORMAL HIGH (ref 4.0–10.5)
nRBC: 0 % (ref 0.0–0.2)

## 2022-10-19 LAB — BASIC METABOLIC PANEL
Anion gap: 9 (ref 5–15)
BUN: 9 mg/dL (ref 6–20)
CO2: 23 mmol/L (ref 22–32)
Calcium: 8.4 mg/dL — ABNORMAL LOW (ref 8.9–10.3)
Chloride: 106 mmol/L (ref 98–111)
Creatinine, Ser: 0.87 mg/dL (ref 0.61–1.24)
GFR, Estimated: >60 mL/min (ref >60)
Glucose, Bld: 160 mg/dL — ABNORMAL HIGH (ref 70–99)
Potassium: 4 mmol/L (ref 3.5–5.1)
Sodium: 138 mmol/L (ref 135–145)

## 2022-10-19 LAB — MAGNESIUM: Magnesium: 1.7 mg/dL (ref 1.7–2.4)

## 2022-10-19 MED ORDER — ONDANSETRON HCL 4 MG/2ML IJ SOLN
4.0000 mg | Freq: Four times a day (QID) | INTRAMUSCULAR | Status: DC | PRN
  Administered 2022-10-19: 4 mg via INTRAVENOUS
  Filled 2022-10-19: qty 2

## 2022-10-19 MED ORDER — HYDROMORPHONE HCL 1 MG/ML IJ SOLN
0.5000 mg | INTRAMUSCULAR | Status: DC | PRN
  Administered 2022-10-19 – 2022-10-20 (×2): 0.5 mg via INTRAVENOUS
  Filled 2022-10-19 (×2): qty 0.5

## 2022-10-19 MED ORDER — MAGNESIUM SULFATE 2 GM/50ML IV SOLN
2.0000 g | Freq: Once | INTRAVENOUS | Status: AC
  Administered 2022-10-19: 2 g via INTRAVENOUS
  Filled 2022-10-19: qty 50

## 2022-10-19 MED ORDER — DOFETILIDE 500 MCG PO CAPS
500.0000 ug | ORAL_CAPSULE | Freq: Two times a day (BID) | ORAL | Status: DC
  Administered 2022-10-19 – 2022-10-21 (×4): 500 ug via ORAL
  Filled 2022-10-19 (×5): qty 1

## 2022-10-19 MED ORDER — DOFETILIDE 500 MCG PO CAPS: 500.0000 ug | ORAL_CAPSULE | Freq: Two times a day (BID) | ORAL | Status: DC

## 2022-10-19 MED ORDER — METOPROLOL SUCCINATE ER 25 MG PO TB24
25.0000 mg | ORAL_TABLET | Freq: Every day | ORAL | Status: DC
  Administered 2022-10-19: 25 mg via ORAL
  Filled 2022-10-19 (×2): qty 1

## 2022-10-19 MED ORDER — SODIUM CHLORIDE 0.9 % IV SOLN: 250.0000 mL | INTRAVENOUS | Status: DC | PRN

## 2022-10-19 MED ORDER — SODIUM CHLORIDE 0.9% FLUSH: 3.0000 mL | INTRAVENOUS | Status: DC | PRN

## 2022-10-19 MED ORDER — SODIUM CHLORIDE 0.9% FLUSH
3.0000 mL | Freq: Two times a day (BID) | INTRAVENOUS | Status: DC
  Administered 2022-10-19 – 2022-10-20 (×4): 3 mL via INTRAVENOUS

## 2022-10-19 NOTE — Progress Notes (Signed)
Pharmacy Review for Dofetilide (Tikosyn)  Admit Complaint: 32 y.o. male admitted 10/18/2022 with atrial fibrillation previously on Tikosyn 593mg twice daily PTA, now to be resumed on dofetilide. Last dose of Tikosyn was PTA on 2/19 AM.  Assessment:  Patient Exclusion Criteria: If any screening criteria checked as "Yes", then  patient  should NOT receive dofetilide until criteria item is corrected. If "Yes" please indicate correction plan.  YES  NO Patient  Exclusion Criteria Correction Plan  []$  [x]$  Baseline QTc interval is greater than or equal to 440 msec. IF above YES box checked dofetilide contraindicated unless patient has ICD; then may proceed if QTc 500-550 msec or with known ventricular conduction abnormalities may proceed with QTc 550-600 msec. QTc = 0.43   [x]$  []$  Magnesium level is less than 1.8 mEq/l : Last magnesium:  Lab Results  Component Value Date   MG 1.7 10/19/2022       IV Mg Sulfate 2g x1  []$  [x]$  Potassium level is less than 4 mEq/l : Last potassium:  Lab Results  Component Value Date   K 4.0 10/19/2022         []$  [x]$  Patient is known or suspected to have a digoxin level greater than 2 ng/ml: No results found for: "DIGOXIN"    []$  [x]$  Creatinine clearance less than 20 ml/min (calculated using Cockcroft-Gault, actual body weight and serum creatinine): Estimated Creatinine Clearance: 179.5 mL/min (by C-G formula based on SCr of 0.87 mg/dL).    []$  [x]$  Patient has received drugs known to prolong the QT intervals within the last 48 hours (phenothiazines, tricyclics or tetracyclic antidepressants, erythromycin, H-1 antihistamines, cisapride, fluoroquinolones, azithromycin). Drugs not listed above may have an, as yet, undetected potential to prolong the QT interval, updated information on QT prolonging agents is available at this website:QT prolonging agents Received 163mZofran in past 24 hrs   []$  [x]$  Patient received a dose of hydrochlorothiazide (Oretic) alone or in  any combination including triamterene (Dyazide, Maxzide) in the last 48 hours.   []$  [x]$  Patient received a medication known to increase dofetilide plasma concentrations prior to initial dofetilide dose:  Trimethoprim (Primsol, Proloprim) in the last 36 hours Verapamil (Calan, Verelan) in the last 36 hours or a sustained release dose in the last 72 hours Megestrol (Megace) in the last 5 days  Cimetidine (Tagamet) in the last 6 hours Ketoconazole (Nizoral) in the last 24 hours Itraconazole (Sporanox) in the last 48 hours  Prochlorperazine (Compazine) in the last 36 hours    []$  [x]$  Patient is known to have a history of torsades de pointes; congenital or acquired long QT syndromes.   []$  [x]$  Patient has received a Class 1 antiarrhythmic with less than 2 half-lives since last dose. (Disopyramide, Quinidine, Procainamide, Lidocaine, Mexiletine, Flecainide, Propafenone)   []$  [x]$  Patient has received amiodarone therapy in the past 3 months or amiodarone level is greater than 0.3 ng/ml.    Patient has been appropriately anticoagulated with apixaban.  Goal of Therapy: Follow renal function, electrolytes, potential drug interactions, and dose adjustment. Provide education and 1 week supply at discharge.  Plan:  [x]$   Physician selected dose within range recommended for patients level of renal function - will monitor for response.  []$   Physician selected initial dose outside of range recommended for patients level of renal function - will discuss if the dose should be altered at this time.   Select One Calculated CrCl  Dose q12h  [x]$  > 60 ml/min 500 mcg  []$  40-60  ml/min 250 mcg  []$  20-40 ml/min 125 mcg    Thank you for allowing pharmacy to be a part of this patient's care.  Ardyth Harps, PharmD Clinical Pharmacist

## 2022-10-19 NOTE — Progress Notes (Signed)
PCA pump d/c'd. RN wasted 20 mg of dilaudid with second RN Desma Maxim, RN. Wasted in stericycle and syringe/tubing placed in sharps box in pyxis room A.

## 2022-10-19 NOTE — TOC Initial Note (Signed)
Transition of Care [facility]) - Initial/Assessment Note    Patient Details  Name: Nathaniel [name] MRN: [id number] Date of Birth: 1991/01/14  Transition of Care [facility]) CM/SW Contact:    Cyndi Bender, RN Phone Number: 10/19/2022, 3:34 PM  Clinical Narrative:                 Spoke to patient and wife regarding transition needs.  Patient was already on Eliquis and Tikosyn prior to hospital admission.  Currently both prescriptions are $0 copay due to patient meeting out of pocket max. Wife states prior to meeting out of pocket max patient was paying $30 for Tikosyn at [employer].  Patient agreeable to to use in house provider [employer] for dme needs. Erasmo Downer with [employer] notified of walker order.  TOC will continue to follow for needs.   Expected Discharge Plan: Home/Self Care Barriers to Discharge: Continued Medical Work up   Patient Goals and CMS Choice Patient states their goals for this hospitalization and ongoing recovery are:: return home          Expected Discharge Plan and Services   Discharge Planning Services: Medication Assistance   Living arrangements for the past 2 months: Single Family Home                 DME Arranged: Walker rolling DME Agency: [employer] Date DME Agency Contacted: 10/19/22 Time DME Agency Contacted: 33 Representative spoke with at DME Agency: Erasmo Downer            Prior Living Arrangements/Services Living arrangements for the past 2 months: Single Family Home Lives with:: Spouse Patient language and need for interpreter reviewed:: Yes Do you feel safe going back to the place where you live?: Yes      Need for Family Participation in Patient Care: Yes (Comment) Care giver support system in place?: Yes (comment)   Criminal Activity/Legal Involvement Pertinent to Current Situation/Hospitalization: No - Comment as needed  Activities of Daily Living      Permission Sought/Granted                  Emotional  Assessment Appearance:: Appears stated age Attitude/Demeanor/Rapport: Gracious Affect (typically observed): Accepting Orientation: : Oriented to Self, Oriented to Place, Oriented to  Time, Oriented to Situation Alcohol / Substance Use: Not Applicable Psych Involvement: No (comment)  Admission diagnosis:  Atrial fibrillation ([place]) [I48.91] Atrial fibrillation, persistent ([place]) [I48.19] Patient Active Problem List   Diagnosis Date Noted   Atrial fibrillation ([place]) 10/18/2022   Atrial fibrillation, persistent ([employer]) 10/18/2022   Tobacco use disorder: dip 07/12/2022   Secondary hypercoagulable state ([place]) 07/06/2022   Long-term current use of benzodiazepine 06/22/2022   Generalized anxiety disorder with panic attacks 06/22/2022   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   Pancreatic divisum 05/11/2018   Acute pancreatitis 05/10/2018   Hypertension 05/10/2018   Acute on chronic pancreatitis ([place]) 08/06/2017   Obesity, Class III, BMI 40-49.9 (morbid obesity) ([place]) 08/06/2017   History of cardiomyopathy 06/05/2017   Hypokalemia 06/05/2017   Hypomagnesemia 06/05/2017   Paroxysmal atrial fibrillation ([place]) 12/09/2016   A-fib ([place]) 12/09/2016   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[place]) [id number]   Chronic systolic CHF (congestive heart failure) ([place])    Morbid obesity due to excess calories ([employer])    Dyslipidemia    History of pancreatitis 01/17/2016   Snoring 01/17/2016   Former smoker 01/17/2016   Alcohol abuse, in remission 01/17/2016   Atrial flutter ([place]) 01/17/2016   PCP:  Neale Burly,  MD Pharmacy:   [place], [place] [id number] [place] [id number] Phone: [phone] Fax: [phone]  [facility] [address] [place] [address] Phone: [phone] Fax: [place], [place] [address] [place] [address] [place] [address] Phone: [phone] Fax:  [phone]     Social Determinants of Health (SDOH) Social History: SDOH Screenings   Depression (PHQ2-9): High Risk (08/12/2022)  Tobacco Use: High Risk (10/19/2022)   SDOH Interventions:     Readmission Risk Interventions     No data to display

## 2022-10-19 NOTE — Telephone Encounter (Signed)
Pharmacy Patient Advocate Encounter  Insurance verification completed.    The patient is insured through [employer]   The patient is currently admitted and ran test claims for the following: Eliquis.  Copays and coinsurance results were relayed to Inpatient clinical team.

## 2022-10-19 NOTE — Discharge Instructions (Signed)

## 2022-10-19 NOTE — Telephone Encounter (Signed)
Pharmacy Patient Advocate Encounter  Insurance verification completed.    The patient is insured through [employer]   The patient is currently admitted and ran test claims for the following: Tikosyn.  Copays and coinsurance results were relayed to Inpatient clinical team.

## 2022-10-19 NOTE — TOC Benefit Eligibility Note (Signed)
Patient Teacher, English as a foreign language completed.    The patient is currently admitted and upon discharge could be taking Eliquis.  The current 30 day co-pay is $0.00.   The patient is insured through [employer]

## 2022-10-19 NOTE — Progress Notes (Signed)
      [address]       [place] [address]             [phone]      1 Day Post-Op Procedure(s) (LRB): CONVERGENT PROCEDURE (N/A) CLIPPING OF LEFT ATRIAL APPENDAGE (N/A) TRANSESOPHAGEAL ECHOCARDIOGRAM (N/A) Subjective: Up in the bedside chair, ate some breakfast. Says he is reasonably comfortable and using the PCA less so far today.   Stable SR since early post-op.   Objective: Vital signs in last 24 hours: Temp:  [97.6 F (36.4 C)-98.6 F (37 C)] 97.8 F (36.6 C) (02/20 0809) Pulse Rate:  [72-96] 79 (02/20 0809) Cardiac Rhythm: Normal sinus rhythm (02/20 0700) Resp:  [11-31] 12 (02/20 0809) BP: (106-137)/(61-82) 124/80 (02/20 0809) SpO2:  [91 %-95 %] 93 % (02/20 0809) Arterial Line BP: (104-131)/(54-64) 118/60 (02/19 1945) Weight:  [144.2 kg-145 kg] 145 kg (02/19 2030)    Intake/Output from previous day: 02/19 0701 - 02/20 0700 In: 1900 [I.V.:1800; IV Piggyback:100] Out: 1229 [Urine:1075; Drains:60; Blood:30; Chest Tube:64] Intake/Output this shift: Total I/O In: 120 [P.O.:120] Out: -   General appearance: alert, cooperative, and mild distress Neurologic: intact Heart: stable SR since early post-op. Minimal drainage from the mediastinal JP.  Lungs: clear breath sounds. Adequate sats on  O2.  Wound: incisions dry. Minimal CT drainage.   Lab Results: Recent Labs    10/18/22 1353 10/19/22 0030  WBC  --  17.9*  HGB 12.6* 13.8  HCT 37.0* 42.5  PLT  --  309   BMET:  Recent Labs    10/18/22 1350 10/18/22 1353 10/19/22 0030  NA 142 141 138  K 3.9 3.9 4.0  CL 106  --  106  CO2  --   --  23  GLUCOSE 101*  --  160*  BUN 12  --  9  CREATININE 0.60*  --  0.87  CALCIUM  --   --  8.4*    PT/INR: No results for input(s): "LABPROT", "INR" in the last 72 hours. ABG    Component Value Date/Time   PHART 7.325 (L) 10/18/2022 1353   HCO3 24.1 10/18/2022 1353   TCO2 25 10/18/2022 1353   ACIDBASEDEF 2.0 10/18/2022 1353   O2SAT 100  10/18/2022 1353   CBG (last 3)  No results for input(s): "GLUCAP" in the last 72 hours.  Assessment/Plan: S/P Procedure(s) (LRB): CONVERGENT PROCEDURE (N/A) CLIPPING OF LEFT ATRIAL APPENDAGE (N/A) TRANSESOPHAGEAL ECHOCARDIOGRAM (N/A)  -POD-1 Convergent procedure and left VATS with clipping of the left atrial appendage.  Stable SR since the procedure. Will d/c the chest tube and mobilize. Start anticoagulation with apixaban.  Will ask for EP input regarding antiarrhythmic medications (on Cardizem, Tikosyn, and metoprolol prior to admission).   -Anticipate discharge tomorrow.    LOS: 1 day    Antony Odea, PA-C [phone] 10/19/2022

## 2022-10-19 NOTE — Discharge Summary (Addendum)
Physician Discharge Summary  Patient ID: YOUSIF [name] MRN: [id number] DOB/AGE: 11-26-1990 32 y.o.  Admit date: 10/18/2022 Discharge date: 10/21/2022  Admission Diagnoses:  Persistent atrial fibrillation Dilated cardiomyopathy Chronic systolic CHF Obesity History of tobacco abuse  Discharge Diagnoses:   Persistent atrial fibrillation Dilated cardiomyopathy Chronic systolic CHF Obesity History of tobacco abuse S/P Convergent Procedure S/P  application of left atrial clip  Discharged Condition: stable  History of Present Illness: 46M with persistent AF s/p 2 catheter ablations. Feels sig worse in AF.    Most sig other medical issue is hx of pancreatitis -  Pancreast divisum. [place] panceas.  Can't get to cyst  Gallbladder out in June lst year. Only 1 pancreatitis since then.  Treated with fluids and pain  meds and 2-3 day stay in the past. No stay more than 4 days, not intubation.    I told him we are getting started with Convergent here and he would be on of the initial patients. I offered him procedure to be later if our experience here if that's what he prefers. It seems that he wants to think about and likely move forward in March. AF is essentially disabling for him, inter-related with anxiety, and making it hard to move forward in life.   Typical workup includes: Coronary eval for patients > 86 yo. - N/A as young patient Echo in the last year - completed 08/11/22     We broadly discussed the rationale for the convergent approach.  Standard recovery is walking around day after procedure, discharge to home postop day 2.  I see convergent (epicardial ablation) patients postoperatively back in the clinic in 1 week and 1 month. Patients generally have a full recovery by 3 weeks.    Risks/benefits/alternatives discussed at length. All questions were asked an answered. Risks are <<1% mortality, 2% morbidity (bleeding, infection, damage to surrounding structures,  pancreatic issues and prolonged stay given hx recurrent pancreatitis) and >97% standard recovery.   Typically I do these procedures on Mondays, last dose of Eliquis is Saturday morning. I restart AC the night of surgery. If they receive an LAA clip, most patients, but not all, come off their AC 2-6 months after surgery at the discretion of their referring cardiologist. Many ultimately have reductions in the degree of antiarrythmic medications as well. Expected outcome is significantly more freedom from Afib (measured by 90% reduction of time in AF).   Course in Hospital: Nathaniel [name] was admitted for elective surgery on 10/18/2021.  A convergent procedure was performed by way of a subxiphoid approach.  This was followed immediately by left video-assisted thoracoscopy with placement of a left atrial appendage clip.  Following the procedure, he was in sinus rhythm and maintained sinus rhythm overnight.  Pain was controlled with the PCA.  He was transferred to [facility].  On the first postoperative day, pain control was adequate and he was maintaining satisfactory oxygen saturation.  He was holding sinus rhythm.  The PCA was weaned and discontinued.  Pain was controlled on oral medications.  The left pleural tube was removed.  We consulted electrophysiology team regarding antiarrhythmic medications postoperatively.  He was on Tikosyn, diltiazem, and metoprolol prior to admission.  The Tikosyn was re-started under the direction of cardiology and daily EKG's were monitored to evaluate the QT interval. He made a progressive and satisfactory recovery after surgery and remained in SR for about 48 hours. He then had an episode of atrial fibrillation with RVR that was treated with  IV metoprolol and converted back to SR after about 2 hours. The daily Toprol dose was then increased by the cardiology team. K+ and Mg++ were replaced as needed to maintain normal parameters.  He was ready for discharge on post-op  day 3. His incisions were dry and he was independent with mobility. Instructions were given and follow arranged with Dr. Tenny Craw as well as the a-fib clinic.   Consults:  Cardiology, EP  Significant Diagnostic Studies:    CLINICAL DATA:  Status post cardiac surgery   EXAM: PORTABLE CHEST 1 VIEW   COMPARISON:  Chest radiograph dated 10/18/2022   FINDINGS: Lines/tubes: Interval repositioning of the left-sided chest tube with tip projecting over the lower left upper lung, previously at the apex.   Chest: Low lung volumes with bibasilar patchy and linear opacities.   Pleura: No pneumothorax or pleural effusion.   Heart/mediastinum: Similar enlarged postsurgical cardiomediastinal silhouette with left atrial appendage clip.   Bones: No acute osseous abnormality.   IMPRESSION: 1. Interval repositioning of the left-sided chest tube with tip projecting over the lower left upper lung, previously at the apex. No pneumothorax. 2. Low lung volumes with bibasilar atelectasis.     Electronically Signed   By: Darrin Nipper M.D.   On: 10/19/2022 08:07      Treatments:   10/18/2022   2:39 PM   PATIENT:  Nathaniel [name]  32 y.o. male   PRE-OPERATIVE DIAGNOSIS:  ATRIAL FIBRILLATION   POST-OPERATIVE DIAGNOSIS:  ATRIAL FIBRILLATION   PROCEDURE:   CONVERGENT PROCEDURE CLIPPING OF LEFT ATRIAL APPENDAGE  TRANSESOPHAGEAL ECHOCARDIOGRAM    SURGEON:   Enter, Pierre Bali, MD - Primary   PHYSICIAN ASSISTANT Hodge Stachnik   ASSISTANTS: Bebe Liter, RN, Jolly Mango, RN, RN First Assistant   ANESTHESIA:   general   EBL: 67m  Discharge Exam: Blood pressure 120/89, pulse 84, temperature 98.5 F (36.9 C), temperature source Oral, resp. rate 20, height 6' (1.829 m), weight (!) 142.7 kg, SpO2 96 %.  General appearance: alert, cooperative, and no distress Neurologic: intact Heart: a-fib last night with RVR, back in SR. The mediastinal JP drained 569mfor  past 24 hours.  Lungs: respirations non-labored on RA Wound: the sub-xiphoid incision is dr, having some serous drainage.  Disposition:  Discharged to home in stable condition Discharge Instructions     Amb Referral to Cardiac Rehabilitation   Complete by: As directed    Diagnosis:  Other Heart Failure (see criteria below if ordering Phase II)     Heart Failure Type: Chronic Systolic   After initial evaluation and assessments completed: Virtual Based Care may be provided alone or in conjunction with Phase 2 Cardiac Rehab based on patient barriers.: Yes   [facility] ([facility]) [facility] OR Traditional Cardiac Rehabilitation (TCR) *If criteria for ICR are not met will enroll in TCR ([facility] Yes      Allergies as of 10/21/2022       Reactions   Morphine Nausea And Vomiting        Medication List     STOP taking these medications    diltiazem 240 MG 24 hr capsule Commonly known as: CARDIZEM CD   diltiazem 30 MG tablet Commonly known as: Cardizem       TAKE these medications    acetaminophen 650 MG CR tablet Commonly known as: TYLENOL  Take 650 mg by mouth every 8 (eight) hours as needed for pain.   amoxicillin 875 MG tablet Commonly known as: AMOXIL Take 875 mg by mouth 2 (two) times daily.   clonazePAM 0.5 MG tablet Commonly known as: KlonoPIN Take 0.5 tablets (0.25 mg total) by mouth 2 (two) times daily.   colchicine 0.6 MG tablet Take 1 tablet (0.6 mg total) by mouth daily for 7 days. What changed: additional instructions   dofetilide 500 MCG capsule Commonly known as: TIKOSYN Take 1 capsule (500 mcg total) by mouth 2 (two) times daily.   Eliquis 5 MG Tabs tablet Generic drug: apixaban TAKE 1 TABLET BY MOUTH TWICE DAILY   methylPREDNISolone 4 MG Tbpk tablet Commonly known as: MEDROL DOSEPAK Use as directed on pack.   metoprolol succinate 50 MG 24 hr tablet Commonly known as: TOPROL-XL Take 1 tablet (50 mg total) by  mouth daily at 8 pm. Take with or immediately following a meal. What changed:  medication strength how much to take when to take this additional instructions   nicotine 21 mg/24hr patch Commonly known as: NICODERM CQ - dosed in mg/24 hours Place 1 patch (21 mg total) onto the skin daily. What changed:  when to take this reasons to take this   ONE-A-DAY MENS PO Take 1 tablet by mouth every morning.   oxyCODONE 5 MG immediate release tablet Commonly known as: Oxy IR/ROXICODONE Take 1 tablet (5 mg total) by mouth every 6 (six) hours as needed for up to 7 days for moderate pain.   pantoprazole 40 MG tablet Commonly known as: PROTONIX TAKE 1 TABLET(40 MG) BY MOUTH DAILY What changed: See the new instructions.   polyethylene glycol 17 g packet Commonly known as: MIRALAX / GLYCOLAX Take 17 g by mouth daily for 7 days.   sertraline 25 MG tablet Commonly known as: ZOLOFT Take 1.5 tablets (37.5 mg total) by mouth daily. TAKE 1 AND 1/2 TABLETS BY MOUTH EVERY DAY What changed:  how much to take additional instructions   triamcinolone cream 0.1 % Commonly known as: KENALOG Apply 1 application topically daily as needed (for irritation).               Durable Medical Equipment  (From admission, onward)           Start     Ordered   10/21/22 0716  For home use only DME Walker rolling  Once       Question Answer Comment  Walker: With 5 Inch Wheels   Patient needs a walker to treat with the following condition Imbalance      10/20/22 0717            Follow-up Information     Enter, Pierre Bali, MD. Go on 10/28/2022.   Specialties: Cardiothoracic Surgery, Cardiology Why: Your appointment is at 2:30pm. Please obtain a chest x-ray at [facility] located at [place]. Contact information: [address] [place]  [address] [phone]         [facility]. Go on 10/22/2022.   Specialty: Cardiology Why:  Appointment for 3pm with nurse for follow up. Contact information: [address], [place] [id number] [facility] at [place] on 10/29/2022.   Specialty: Cardiology Why: Appointment at 9:00 am with Malka So. Contact information: [address] [id number] [place] [phone]  Signed: Antony Odea, PA-C 10/21/2022, 1:04 PM

## 2022-10-19 NOTE — Progress Notes (Signed)
Mobility Specialist Progress Note:   10/19/22 1506  Mobility  Activity Ambulated with assistance in hallway  Level of Assistance Standby assist, set-up cues, supervision of patient - no hands on  Assistive Device Front wheel walker  Distance Ambulated (ft) 230 ft (100 ft with walker & 130 without walker)  Activity Response Tolerated well  $Mobility charge 1 Mobility   Pre- Mobility:  69 HR; 91% SpO2 (3L) During Mobility: 84 HR; 93% SpO2 (6L) Post Mobility:   79 HR; 92% SpO2 (3L)  Pt in bed willing to participate in mobility. Complaints of soreness where chest tube was and feeling SOB. Left in chair with call bell in reach and all needs met.   Nathaniel [name] Nathaniel [name] Mobility Specialist Please contact via [employer] or  Rehab Office at [phone]

## 2022-10-19 NOTE — Evaluation (Signed)
Occupational Therapy Evaluation Patient Details Name: Nathaniel [name] MRN: [id number] DOB: [birth year] Today's Date: 10/19/2022   History of Present Illness 32 yo male admitted 2/19 for convergent ablation due to persistent Afib s/p 2 prior ablations (2017/2021). PMhx: cardiomyopathy, CHF, depression, pancreatitis   Clinical Impression   Pt reports independence at baseline with ADLs and functional mobility, lives with spouse who plans to stay home/work from home for 1-2 weeks at d/c. Pt currently needing min guard-min A for ADLs, supervision for bed mobility, and min guard A for transfers without AD. SpO2 in high 80's-mid 90's on 2L O2. Pt presenting with impairments listed below, will follow acutely. Anticipate no OT follow up needs at d/c.      Recommendations for follow up therapy are one component of a multi-disciplinary discharge planning process, led by the attending physician.  Recommendations may be updated based on patient status, additional functional criteria and insurance authorization.   Follow Up Recommendations  No OT follow up     Assistance Recommended at Discharge PRN  Patient can return home with the following A little help with walking and/or transfers;A little help with bathing/dressing/bathroom;Assistance with cooking/housework;Assist for transportation;Help with stairs or ramp for entrance    Functional Status Assessment  Patient has had a recent decline in their functional status and demonstrates the ability to make significant improvements in function in a reasonable and predictable amount of time.  Equipment Recommendations  None recommended by OT    Recommendations for Other Services PT consult     Precautions / Restrictions Precautions Precautions: Other (comment) Precaution Comments: watch O2, JP drain Restrictions Weight Bearing Restrictions: No      Mobility Bed Mobility Overal bed mobility: Needs Assistance Bed Mobility: Sidelying to Sit    Sidelying to sit: Supervision            Transfers Overall transfer level: Needs assistance   Transfers: Sit to/from Stand Sit to Stand: Min guard                  Balance                                           ADL either performed or assessed with clinical judgement   ADL Overall ADL's : Needs assistance/impaired Eating/Feeding: Set up   Grooming: Min guard   Upper Body Bathing: Min guard   Lower Body Bathing: Minimal assistance   Upper Body Dressing : Min guard   Lower Body Dressing: Minimal assistance   Toilet Transfer: Min guard;Ambulation;Regular Museum/gallery exhibitions officer and Hygiene: Supervision/safety       Functional mobility during ADLs: Supervision/safety       Vision   Vision Assessment?: No apparent visual deficits     Perception Perception Perception Tested?: No   Praxis Praxis Praxis tested?: Not tested    Pertinent Vitals/Pain Pain Assessment Pain Assessment: Faces Pain Score: 2  Faces Pain Scale: Hurts a little bit Pain Location: throat from "breathing tube" Pain Descriptors / Indicators: Aching, Guarding Pain Intervention(s): Limited activity within patient's tolerance, Monitored during session, Repositioned     Hand Dominance Right   Extremity/Trunk Assessment Upper Extremity Assessment Upper Extremity Assessment: Overall WFL for tasks assessed   Lower Extremity Assessment Lower Extremity Assessment: Defer to PT evaluation   Cervical / Trunk Assessment Cervical / Trunk Assessment: Normal   Communication Communication Communication: No  difficulties   Cognition Arousal/Alertness: Awake/alert Behavior During Therapy: WFL for tasks assessed/performed Overall Cognitive Status: Within Functional Limits for tasks assessed                                       General Comments  SpO2 down to high 80's/low 90s on 2L O2    Exercises     Shoulder Instructions       Home Living Family/patient expects to be discharged to:: Private residence Living Arrangements: Spouse/significant other Available Help at Discharge: Family;Available 24 hours/day Type of Home: House Home Access: Stairs to enter [employer] of Steps: 3   Home Layout: One level     Bathroom Shower/Tub: Teacher, early years/pre: Standard     Home Equipment: Building services engineer Comments: spouse is planning to work from home for 1-2 weeks while pt is recovering      Prior Functioning/Environment Prior Level of Function : Independent/Modified Independent;Driving             Mobility Comments: no AD use ADLs Comments: ind, no O2 use        OT Problem List: Decreased strength;Decreased range of motion;Impaired balance (sitting and/or standing);Decreased activity tolerance;Cardiopulmonary status limiting activity      OT Treatment/Interventions: Self-care/ADL training;Therapeutic exercise;Energy conservation;DME and/or AE instruction;Therapeutic activities;Patient/family education;Balance training    OT Goals(Current goals can be found in the care plan section) Acute Rehab OT Goals Patient Stated Goal: none stated OT Goal Formulation: With patient Time For Goal Achievement: 11/02/22 Potential to Achieve Goals: Good ADL Goals Pt Will Perform Upper Body Dressing: with modified independence;sitting Pt Will Perform Lower Body Dressing: with modified independence;sit to/from stand;sitting/lateral leans Pt Will Transfer to Toilet: with modified independence;ambulating;regular height toilet Pt Will Perform Tub/Shower Transfer: Tub transfer;Shower transfer;with modified independence;ambulating Additional ADL Goal #1: pt will perform 2 standing functional tasks with SpO2 above 90% in order to improve activity tolerance for ADLs  OT Frequency: Min 2X/week    Co-evaluation              AM-PAC OT "6 Clicks" Daily Activity     Outcome Measure  Help from another person eating meals?: None Help from another person taking care of personal grooming?: None Help from another person toileting, which includes using toliet, bedpan, or urinal?: A Little Help from another person bathing (including washing, rinsing, drying)?: A Little Help from another person to put on and taking off regular upper body clothing?: A Little Help from another person to put on and taking off regular lower body clothing?: A Little 6 Click Score: 20   End of Session Equipment Utilized During Treatment: Gait belt;Oxygen (2L) Nurse Communication: Mobility status;Other (comment) (pt wanting medication for  hiccups)  Activity Tolerance: Patient tolerated treatment well Patient left: in bed;with call bell/phone within reach  OT Visit Diagnosis: Unsteadiness on feet (R26.81);Other abnormalities of gait and mobility (R26.89);Muscle weakness (generalized) (M62.81)                Time: [id number] OT Time Calculation (min): 25 min Charges:  OT General Charges $OT Visit: 1 Visit OT Evaluation $OT Eval Low Complexity: 1 Low OT Treatments $Self Care/Home Management : 8-22 mins  Renaye Rakers, OTD, OTR/L SecureChat Preferred Acute Rehab [phone]   Ulla Gallo 10/19/2022, 12:31 PM

## 2022-10-19 NOTE — Consult Note (Signed)
Cardiology Consultation   Patient ID: Nathaniel [name] MRN: [id number]; DOB: 1991/08/21  Admit date: 10/18/2022 Date of Consult: 10/19/2022  PCP:  Neale Burly, MD   [place] Providers Cardiologist:  Vickie Epley, MD  Electrophysiologist:  Vickie Epley, MD  }     Patient Profile:   Nathaniel [name] is a 32 y.o. male with a hx of HTN, morbid obesity, pancreatitis, persistent AFib/flutter w/hx of tachy-mediated CM who is being seen 10/19/2022 for the evaluation of AAD management at the request of Dr. Tenny Craw now s/p Convergent procedure.  AFib Hx PVI/CTI ablation 12/09/2016 (Dr. Rayann Heman) PVI/CTI ablation 11/09/2019 (Dr. Rayann Heman) [place] started Nov 2023 10/18/22: Convergent procedure/LAA clipping, Dr. Tenny Craw  History of Present Illness:   Mr. Furtado was admitted yesterday underwent convergent procedure w/LAA clipping. EP is asked on board to adid in his AAD management.  He is POD #1 today Off PCA pump CT removed this AM JP remains   Eliquis resumed last PM Home: Diltiazem 240 QD Tikosyn 537mg BID Toprol 274mdaily Not yet resumed  BP stable, 100000000ystolic  LABS K+ 4.0 BUN/Creat 9/0.87 WBC 17.9 (likely reactive) afebrile H/H 13/42 Plts 309  He has been ambulating, much more comfortable with CT out Post-op discomfort, otherwise no complaints  Past Medical History:  Diagnosis Date   Acute pancreatitis without infection or necrosis 03/05/2016   Anginal pain ([facility]   Happens around moments of A.Fib   Anxiety    Arthritis    Back   Atrial fibrillation (HCC)    Atrial flutter ([facility]05/20/2017   Admx 5/17 with AFlutter with RVR in setting of pancreatitis // CHADS2-VASc=1 (CHF) // Anticoag not started // Amiodarone >> NSR    Cardiomyopathy (HCC)    CHF (congestive heart failure) (HCC)    Chronic lower back pain    Chronic systolic CHF (congestive heart failure) ([facility]   Echo 01/17/16 - EF 35-40%, diffuse HK, mild LAE    DCM  (dilated cardiomyopathy) ([facility]06/01/2016   a. EF 35-40% in setting of AFlutter with RVR in 5/17 // probably tachycardia mediated // needs FU echo 2-3 mos after NSR restored // b. Limited Echo 7/17: EF 45-50%, diff HK, mild LAE   Depression    "last 2-3 months; never treated for it" (12/09/2016)   Dysrhythmia    A. Fib   Fatty liver 12/2015   per CT   Gastroenteritis 06/05/2017   GERD (gastroesophageal reflux disease)    Headache    "monthly" (12/09/2016)   Obesity    BMI 45 12/2015   Pancreatic pseudocyst    Pancreatitis 2012   [facility].    Pancreatitis 2012 X 2; 2017 X 2   Pancreatitis 03/05/2016   Paroxysmal atrial fibrillation ([facility]   Typical atrial flutter ([facility]CG[facility]10/03/2017    Past Surgical History:  Procedure Laterality Date   ATRIAL FIBRILLATION ABLATION N/A 12/09/2016   Procedure: Atrial Fibrillation Ablation;  Surgeon: JaThompson GrayerMD;  [facility];  Service: Cardiovascular;  Laterality: N/A;   ATRIAL FIBRILLATION ABLATION N/A 11/09/2019   Procedure: ATRIAL FIBRILLATION ABLATION;  Surgeon: AlThompson GrayerMD;  [facility];  Service: Cardiovascular;  Laterality: N/A;   CHOLECYSTECTOMY  2022   CLIPPING OF ATRIAL APPENDAGE N/A 10/18/2022   Procedure: CLIPPING OF LEFT ATRIAL APPENDAGE;  Surgeon: EnNeomia GlassMD;  [facility] Service:  Open Heart Surgery;  Laterality: N/A;   HAND SURGERY Right ~ 2008   "had pins in it; pulled them"   TEE WITHOUT CARDIOVERSION N/A 12/08/2016   Procedure: TRANSESOPHAGEAL ECHOCARDIOGRAM (TEE);  Surgeon: Jerline Pain, MD;  Location: [place];  Service: Cardiovascular;  Laterality: N/A;   TEE WITHOUT CARDIOVERSION N/A 10/18/2022   Procedure: TRANSESOPHAGEAL ECHOCARDIOGRAM;  Surgeon: Neomia Glass, MD;  Location: [place];  Service: Open Heart Surgery;  Laterality: N/A;   TONSILLECTOMY AND ADENOIDECTOMY     "in my teens"      Home Medications:  Prior to Admission medications   Medication Sig Start Date End Date Taking? Authorizing Provider  acetaminophen (TYLENOL) 650 MG CR tablet Take 650 mg by mouth every 8 (eight) hours as needed for pain.   Yes [provider]  amoxicillin (AMOXIL) 875 MG tablet Take 875 mg by mouth 2 (two) times daily. 10/11/22  Yes [provider]  clonazePAM (KLONOPIN) 0.5 MG tablet Take 0.5 tablets (0.25 mg total) by mouth 2 (two) times daily. 07/12/22  Yes Jacquelynn Cree, MD  colchicine 0.6 MG tablet Take 1 tablet (0.6 mg total) by mouth daily for 6 days. Take 1 tablet daily beginning 1 week prior to surgery. Do not take medication morning of surgery. 09/22/22 10/12/22 Yes Enter, Pierre Bali, MD  diltiazem (CARDIZEM CD) 240 MG 24 hr capsule TAKE ONE CAPSULE BY MOUTH EVERY DAY 08/16/22  Yes Vickie Epley, MD  dofetilide (TIKOSYN) 500 MCG capsule Take 1 capsule (500 mcg total) by mouth 2 (two) times daily. 07/19/22  Yes Fenton, Clint R, PA  ELIQUIS 5 MG TABS tablet TAKE 1 TABLET BY MOUTH TWICE DAILY 08/17/22  Yes Allred, Jeneen Rinks, MD  metoprolol succinate (TOPROL XL) 25 MG 24 hr tablet Take 1 tablet (25 mg total) by mouth at bedtime. 07/16/22 07/16/23 Yes Fenton, Clint R, PA  Multiple Vitamin (ONE-A-DAY MENS PO) Take 1 tablet by mouth every morning.   Yes [provider]  nicotine (NICODERM CQ - DOSED IN MG/24 HOURS) 21 mg/24hr patch Place 1 patch (21 mg total) onto the skin daily. Patient taking differently: Place 21 mg onto the skin daily as needed (Stop smoking). 07/12/22  Yes Jacquelynn Cree, MD  pantoprazole (PROTONIX) 40 MG tablet TAKE 1 TABLET(40 MG) BY MOUTH DAILY Patient taking differently: Take 40 mg by mouth daily. 12/21/19  Yes Allred, Jeneen Rinks, MD  sertraline (ZOLOFT) 25 MG tablet Take 1.5 tablets (37.5 mg total) by mouth daily. TAKE 1 AND 1/2 TABLETS BY MOUTH EVERY DAY Patient taking differently: Take 25 mg by mouth daily. 08/15/22 10/18/22 Yes Norman Clay, MD  diltiazem (CARDIZEM) 30 MG tablet Take 1 tablet every 4 hours AS NEEDED for heart rate >100 06/01/22   Sherran Needs, NP  triamcinolone cream (KENALOG) 0.1 % Apply 1 application topically daily as needed (for irritation).     [provider]    Inpatient Medications: Scheduled Meds:  acetaminophen  1,000 mg Oral Q6H   Or   acetaminophen (TYLENOL) oral liquid 160 mg/5 mL  1,000 mg Oral Q6H   apixaban  5 mg Oral BID   bisacodyl  10 mg Oral Daily   Chlorhexidine Gluconate Cloth  6 each Topical Daily   colchicine  0.6 mg Oral Daily   hydrocortisone sod succinate (SOLU-CORTEF) inj  40 mg Intravenous Q8H   pantoprazole  40 mg Oral Daily   polyethylene glycol  17 g Oral Daily   Continuous Infusions:  PRN Meds:  hydrALAZINE, HYDROmorphone (DILAUDID) injection, ondansetron (ZOFRAN) IV, oxyCODONE, polyethylene glycol  Allergies:    Allergies  Allergen Reactions   Morphine Nausea And Vomiting    Social History:   Social History   Socioeconomic History   Marital status: Married    Spouse name: Not on file   Number of children: 0   Years of education: 12   Highest education level: Not on file  Occupational History    Employer: [employer]  Tobacco Use   Smoking status: Former    Packs/day: 0.50    Types: Cigarettes    Quit date: 2016    Years since quitting: 8.1   Smokeless tobacco: Current    Types: Snuff   Tobacco comments:    Nicotine patches sent 07/12/22  Vaping Use   Vaping Use: Never used  Substance and Sexual Activity   Alcohol use: Not Currently    Comment: last drink 2018   Drug use: Not Currently    Types: Marijuana    Comment: 12/09/2016 "daily from age 80 to 37"   Sexual activity: Yes    Birth control/protection: None  Other Topics Concern   Not on file  Social History Narrative   Pt lives in [place] with fiancee.  Works at [employer] in [place].   Social Determinants of Radio broadcast assistant Strain: Not on file   Food Insecurity: Not on file  Transportation Needs: Not on file  Physical Activity: Not on file  Stress: Not on file  Social Connections: Not on file  Intimate Partner Violence: Not on file    Family History:    Family History  Problem Relation Age of Onset   Diabetes Mother    Gallbladder disease Mother    Hypertension Father    Supraventricular tachycardia Brother    Cancer Maternal Grandmother    Cancer Maternal Grandfather    Cirrhosis Maternal Grandfather    Stroke Paternal Grandmother    Celiac disease Neg Hx    Breast cancer Neg Hx    Cholelithiasis Neg Hx    Colon cancer Neg Hx    Colon polyps Neg Hx    Crohn's disease Neg Hx    Esophageal cancer Neg Hx    Hepatitis Neg Hx    Inflammatory bowel disease Neg Hx    Liver disease Neg Hx    Pancreatitis Neg Hx    Stomach cancer Neg Hx    Thyroid disease Neg Hx    Ulcerative colitis Neg Hx      ROS:  Please see the history of present illness.  All other ROS reviewed and negative.     Physical Exam/Data:   Vitals:   10/19/22 0255 10/19/22 0400 10/19/22 0809 10/19/22 1010  BP: 108/77 123/75 124/80   Pulse: 89 85 79   Resp: 11 11 12 17  $ Temp: 98.5 F (36.9 C)  97.8 F (36.6 C)   TempSrc: Oral  Oral   SpO2: 92% 92% 93% (!) 89%  Weight:    (!) 143.9 kg  Height:    6' (1.829 m)    Intake/Output Summary (Last 24 hours) at 10/19/2022 1229 Last data filed at 10/19/2022 1010 Gross per 24 hour  Intake 2020 ml  Output 1287 ml  Net 733 ml      10/19/2022   10:10 AM 10/18/2022    8:30 PM 10/18/2022    9:54 AM  Last 3 Weights  Weight (lbs) 317 lb 4.8 oz 319 lb 10.7 oz 318 lb  Weight (kg)  143.926 kg 145 kg 144.244 kg     Body mass index is 43.03 kg/m.  General:  Well nourished, well developed, in no acute distress HEENT: normal Neck: no JVD Vascular: No carotid bruits; Distal pulses 2+ bilaterally Cardiac:  RRR; no murmurs, gallops or rubs Lungs:  CTA b/l no wheezing, rhonchi or rales  Abd: soft,  nontender  Ext: no edema Musculoskeletal:  No deformities Skin: warm and dry  Wounds are CDI, JP drain in place Neuro:  no focal abnormalities noted Psych:  Normal affect   EKG:  The EKG was personally reviewed and demonstrates:    SR 78bpm, QTc 441m, reviewed with Dr. LQuentin Ore Telemetry:  Telemetry was personally reviewed and demonstrates:   SR 70's  Relevant CV Studies:  11/09/19: EPS/ablation CONCLUSIONS: 1. Sinus rhythm upon presentation.   2. Intracardiac echo reveals a moderate sized left atrium with four separate pulmonary veins without evidence of pulmonary vein stenosis.  There was a common ostium to the left PVs.  The right superior PV was large. 3. Return of electric conduction within the right superior pulmonary vein at baseline, more prominent after adenosine infusion.  The right inferior, left superior and left inferior PVs were quiescent from a prior ablation and did not require additional ablation today. 4. Successful electrical reisolation of the right superior pulmonary vein with radiofrequency current.    5. Right atrial flutter induced with isuprel infusion and catheter manipulation which spontaneously terminated and was not sufficient in duration for mapping. 6. Return of conduction along the previously ablated Cavo-tricuspid isthmus 7. Ablation was performed along the CTI today with complete bidirectional isthmus block achieved.  8. No inducible sustained arrhythmias following ablation both on and off of Isuprel 9. No early apparent complications.   01/25/2019: TTE 1. The left ventricle has a visually estimated ejection fraction of 55%.  The cavity size was normal. Left ventricular diastolic parameters were  normal.   2. The right ventricle has normal systolic function. The cavity was  normal. There is no increase in right ventricular wall thickness.   3. The mitral valve is grossly normal. There is mild mitral annular  calcification present.   4. The  tricuspid valve is grossly normal.   5. The aortic valve is tricuspid.   6. The aortic root is normal in size and structure.    11/08/21 EPS/Ablation (re-do PVI, + CTI) CONCLUSIONS: 1. Sinus rhythm upon presentation.   2. Intracardiac echo reveals a moderate sized left atrium with four separate pulmonary veins without evidence of pulmonary vein stenosis.  There was a common ostium to the left PVs.  The right superior PV was large. 3. Return of electric conduction within the right superior pulmonary vein at baseline, more prominent after adenosine infusion.  The right inferior, left superior and left inferior PVs were quiescent from a prior ablation and did not require additional ablation today. 4. Successful electrical reisolation of the right superior pulmonary vein with radiofrequency current.    5. Right atrial flutter induced with isuprel infusion and catheter manipulation which spontaneously terminated and was not sufficient in duration for mapping. 6. Return of conduction along the previously ablated Cavo-tricuspid isthmus 7. Ablation was performed along the CTI today with complete bidirectional isthmus block achieved.  8. No inducible sustained arrhythmias following ablation both on and off of Isuprel 9. No early apparent complications.   12/09/2016: EPS/Ablation CONCLUSIONS: 1. Sinus rhythm upon presentation.   2. Intracardiac echo reveals a short common ostium to the left  superior and inferior pulmonary veins.  These were moderate in size.  The right superior pulmonary vein was very large in sinus and had two large branches.  The right inferior pulmonary vein was small.  3. Successful electrical isolation and anatomical encircling of all four pulmonary veins with radiofrequency current.    4. Cavo-tricuspid isthmus ablation was performed with complete bidirectional isthmus block achieved.  5. No inducible arrhythmias following ablation both on and off of Isuprel 6. No early apparent  complications.    Laboratory Data:  High Sensitivity Troponin:  No results for input(s): "TROPONINIHS" in the last 720 hours.   Chemistry Recent Labs  Lab 10/14/22 1552 10/18/22 1350 10/18/22 1353 10/19/22 0030  NA 139 142 141 138  K 3.9 3.9 3.9 4.0  CL 108 106  --  106  CO2 23  --   --  23  GLUCOSE 95 101*  --  160*  BUN 10 12  --  9  CREATININE 0.71 0.60*  --  0.87  CALCIUM 8.7*  --   --  8.4*  GFRNONAA >60  --   --  >60  ANIONGAP 8  --   --  9    Recent Labs  Lab 10/14/22 1552  PROT 7.2  ALBUMIN 3.9  AST 25  ALT 31  ALKPHOS 41  BILITOT 0.5   Lipids No results for input(s): "CHOL", "TRIG", "HDL", "LABVLDL", "LDLCALC", "CHOLHDL" in the last 168 hours.  Hematology Recent Labs  Lab 10/14/22 1552 10/18/22 1350 10/18/22 1353 10/19/22 0030  WBC 6.7  --   --  17.9*  RBC 4.76  --   --  4.70  HGB 14.1 12.2* 12.6* 13.8  HCT 42.5 36.0* 37.0* 42.5  MCV 89.3  --   --  90.4  MCH 29.6  --   --  29.4  MCHC 33.2  --   --  32.5  RDW 12.5  --   --  12.7  PLT 330  --   --  309   Thyroid No results for input(s): "TSH", "FREET4" in the last 168 hours.  BNPNo results for input(s): "BNP", "PROBNP" in the last 168 hours.  DDimer No results for input(s): "DDIMER" in the last 168 hours.   Radiology/Studies:  DG Chest Port 1 View Result Date: 10/19/2022 CLINICAL DATA:  Status post cardiac surgery EXAM: PORTABLE CHEST 1 VIEW COMPARISON:  Chest radiograph dated 10/18/2022 FINDINGS: Lines/tubes: Interval repositioning of the left-sided chest tube with tip projecting over the lower left upper lung, previously at the apex. Chest: Low lung volumes with bibasilar patchy and linear opacities. Pleura: No pneumothorax or pleural effusion. Heart/mediastinum: Similar enlarged postsurgical cardiomediastinal silhouette with left atrial appendage clip. Bones: No acute osseous abnormality. IMPRESSION: 1. Interval repositioning of the left-sided chest tube with tip projecting over the lower left  upper lung, previously at the apex. No pneumothorax. 2. Low lung volumes with bibasilar atelectasis. Electronically Signed   By: Darrin Nipper M.D.   On: 10/19/2022 08:07     Assessment and Plan:   Persistent AFib CHA2DS2Vasc is zero, on Eliquis POD #1 today s/p Convergent procedure He looks great Plan to resume home Tikosyn 569mg BID He got zofran, otherwise is see no contraindicated meds. I discussed with pharmacy They will review/confirm 1/2 life of zofran and confirm restart timing If able to start this evening they weill, otherwise will be restarted tomorrow.  Mag level is pending, otherwise labs are OK  Plan for short re-load of 4 doses, will  have him here another few days.  Resume home metoprolol and ditiazem when able as per CTS team/any BP parameters they prefer post-op   Risk Assessment/Risk Scores:    For questions or updates, please contact [place] Please consult [email or web address] for contact info under    Signed, Baldwin Jamaica, PA-C  10/19/2022 12:29 PM

## 2022-10-19 NOTE — Anesthesia Postprocedure Evaluation (Signed)
Anesthesia Post Note  Patient: NENAD TRETO  Procedure(s) Performed: CONVERGENT PROCEDURE CLIPPING OF LEFT ATRIAL APPENDAGE TRANSESOPHAGEAL ECHOCARDIOGRAM     Patient location during evaluation: PACU Anesthesia Type: General Level of consciousness: awake and alert Pain management: pain level controlled Vital Signs Assessment: post-procedure vital signs reviewed and stable Respiratory status: spontaneous breathing, nonlabored ventilation, respiratory function stable and patient connected to nasal cannula oxygen Cardiovascular status: blood pressure returned to baseline and stable Postop Assessment: no apparent nausea or vomiting Anesthetic complications: no   No notable events documented.  Last Vitals:  Vitals:   10/19/22 0400 10/19/22 0809  BP: 123/75 124/80  Pulse: 85 79  Resp: 11 12  Temp:  36.6 C  SpO2: 92% 93%    Last Pain:  Vitals:   10/19/22 0809  TempSrc: Oral  PainSc:                  Tiajuana Amass

## 2022-10-19 NOTE — Hospital Course (Signed)
History of Present Illness: 56M with persistent AF s/p 2 catheter ablations. Feels sig worse in AF.    Most sig other medical issue is hx of pancreatitis -  Pancreast divisum. Annulu[place] panceas.  Can't get to cyst  Gallbladder out in June lst year. Only 1 pancreatitis since then.  Treated with fluids and pain  meds and 2-3 day stay in the past. No stay more than 4 days, not intubation.    I told him we are getting started with Convergent here and he would be on of the initial patients. I offered him procedure to be later if our experience here if that's what he prefers. It seems that he wants to think about and likely move forward in March. AF is essentially disabling for him, inter-related with anxiety, and making it hard to move forward in life.   Typical workup includes: Coronary eval for patients > 33 yo. - N/A as young patient Echo in the last year - completed 08/11/22     We broadly discussed the rationale for the convergent approach.  Standard recovery is walking around day after procedure, discharge to home postop day 2.  I see convergent (epicardial ablation) patients postoperatively back in the clinic in 1 week and 1 month. Patients generally have a full recovery by 3 weeks.    Risks/benefits/alternatives discussed at length. All questions were asked an answered. Risks are <<1% mortality, 2% morbidity (bleeding, infection, damage to surrounding structures, pancreatic issues and prolonged stay given hx recurrent pancreatitis) and >97% standard recovery.   Typically I do these procedures on Mondays, last dose of Eliquis is Saturday morning. I restart AC the night of surgery. If they receive an LAA clip, most patients, but not all, come off their AC 2-6 months after surgery at the discretion of their referring cardiologist. Many ultimately have reductions in the degree of antiarrythmic medications as well. Expected outcome is significantly more freedom from Afib (measured by 90% reduction  of time in AF).   Course in Hospital: Nathaniel [name] was admitted for elective surgery on 10/18/2021.  A convergent procedure was performed by way of a subxiphoid approach.  This was followed immediately by left video-assisted thoracoscopy with placement of a left atrial appendage clip.  Following the procedure, he was in sinus rhythm and maintained sinus rhythm overnight.  Pain was controlled with the PCA.  He was transferred to [facility].  On the first postoperative day, pain control was adequate and he was maintaining satisfactory oxygen saturation.  He was holding sinus rhythm.  The PCA was weaned and discontinued.  Pain was controlled on oral medications.  The left pleural tube was removed.  We consulted electrophysiology team regarding antiarrhythmic medications postoperatively.  He was on Tikosyn, diltiazem, and metoprolol prior to admission.

## 2022-10-19 NOTE — Evaluation (Signed)
Physical Therapy Evaluation Patient Details Name: Nathaniel [name] MRN: [id number] DOB: Mar 02, 1991 Today's Date: 10/19/2022  History of Present Illness  32 yo male admitted 2/19 for convergent ablation due to persistent Afib s/p 2 prior ablations (2017/2021). PMhx: cardiomyopathy, CHF, depression, pancreatitis  Clinical Impression  Pt pleasant and willing to progress mobility and walk. Pt reports some nausea with PCA use today and on 3L at rest with SPO2 95% and able to tolerate RA for activity with SPO2 88-92% with frequent cues for breathing technique and sequence of activity. PT with reliance on RW this session but do not anticipate he will need long term and discussed with pt moving more today after chest tube removal to assess need for supplemental O2 and RW. Pt with IS end of session but only able to pull 500cc and encouraged continued use. Pt with decreased activity tolerance, gait and pulmonary function who will benefit from acute therapy to maximize safety and mobility. Pt on 1L end of session at 91% with RN aware. HR 89-94       Recommendations for follow up therapy are one component of a multi-disciplinary discharge planning process, led by the attending physician.  Recommendations may be updated based on patient status, additional functional criteria and insurance authorization.  Follow Up Recommendations No PT follow up      Assistance Recommended at Discharge Intermittent Supervision/Assistance  Patient can return home with the following  A little help with walking and/or transfers;A little help with bathing/dressing/bathroom;Help with stairs or ramp for entrance;Assistance with cooking/housework    Equipment Recommendations Rolling walker (2 wheels)  Recommendations for Other Services       Functional Status Assessment Patient has had a recent decline in their functional status and demonstrates the ability to make significant improvements in function in a reasonable and  predictable amount of time.     Precautions / Restrictions Precautions Precautions: Other (comment) Precaution Comments: watch sats, jp drain, chest tube      Mobility  Bed Mobility               General bed mobility comments: OOB on arrival and end of session    Transfers Overall transfer level: Needs assistance   Transfers: Sit to/from Stand Sit to Stand: Min guard           General transfer comment: guarding for lines    Ambulation/Gait Ambulation/Gait assistance: Min guard Gait Distance (Feet): 160 Feet Assistive device: Rolling walker (2 wheels) Gait Pattern/deviations: Step-through pattern, Decreased stride length   Gait velocity interpretation: <1.8 ft/sec, indicate of risk for recurrent falls   General Gait Details: pt able to take a few steps without RW but preference for stability and support with RW this session. Assist for lines with mod cues for breathing technique. On RA pt with SPO2 88-92% with 3 standing rests to recover sats and pt able to self direct distance  Stairs            Wheelchair Mobility    Modified Rankin (Stroke Patients Only)       Balance Overall balance assessment: Mild deficits observed, not formally tested                                           Pertinent Vitals/Pain Pain Assessment Pain Assessment: 0-10 Pain Score: 7  Pain Location: left shoulder Pain Descriptors / Indicators: Aching, Guarding Pain  Intervention(s): Limited activity within patient's tolerance, Repositioned, PCA encouraged, Monitored during session    Concepcion expects to be discharged to:: Private residence Living Arrangements: Spouse/significant other Available Help at Discharge: Family;Available 24 hours/day Type of Home: House Home Access: Stairs to enter   [employer] of Steps: 3   Home Layout: One level Home Equipment: Shower seat      Prior Function Prior Level of Function :  Independent/Modified Independent                     Hand Dominance        Extremity/Trunk Assessment   Upper Extremity Assessment Upper Extremity Assessment: Overall WFL for tasks assessed    Lower Extremity Assessment Lower Extremity Assessment: Overall WFL for tasks assessed    Cervical / Trunk Assessment Cervical / Trunk Assessment: Normal  Communication   Communication: No difficulties  Cognition Arousal/Alertness: Awake/alert Behavior During Therapy: WFL for tasks assessed/performed Overall Cognitive Status: Within Functional Limits for tasks assessed                                          General Comments      Exercises     Assessment/Plan    PT Assessment Patient needs continued PT services  PT Problem List Decreased mobility;Decreased activity tolerance;Cardiopulmonary status limiting activity;Decreased knowledge of use of DME       PT Treatment Interventions Gait training;Therapeutic exercise;Patient/family education;Stair training;Functional mobility training;Therapeutic activities;DME instruction    PT Goals (Current goals can be found in the Care Plan section)  Acute Rehab PT Goals 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 PT Goal Formulation: With patient Time For Goal Achievement: 10/26/22 Potential to Achieve Goals: Good    Frequency Min 3X/week     Co-evaluation               AM-PAC PT "6 Clicks" Mobility  Outcome Measure Help needed turning from your back to your side while in a flat bed without using bedrails?: A Little Help needed moving from lying on your back to sitting on the side of a flat bed without using bedrails?: A Little Help needed moving to and from a bed to a chair (including a wheelchair)?: A Little Help needed standing up from a chair using your arms (e.g., wheelchair or bedside chair)?: A Little Help needed to walk in hospital room?: A Little Help needed climbing 3-5 steps with  a railing? : A Lot 6 Click Score: 17    End of Session Equipment Utilized During Treatment: Gait belt Activity Tolerance: Patient tolerated treatment well Patient left: in chair;with call bell/phone within reach Nurse Communication: Mobility status PT Visit Diagnosis: Other abnormalities of gait and mobility (R26.89)    Time: [id number] PT Time Calculation (min) (ACUTE ONLY): 25 min   Charges:   PT Evaluation $PT Eval Moderate Complexity: 1 Mod          [place], PT Acute Rehabilitation Services Office: [place] B Michele Judy 10/19/2022, 11:10 AM

## 2022-10-20 DIAGNOSIS — I4819 Other persistent atrial fibrillation: Secondary | ICD-10-CM | POA: Diagnosis not present

## 2022-10-20 LAB — CBC
HCT: 40.9 % (ref 39.0–52.0)
Hemoglobin: 13.1 g/dL (ref 13.0–17.0)
MCH: 29.4 pg (ref 26.0–34.0)
MCHC: 32 g/dL (ref 30.0–36.0)
MCV: 91.9 fL (ref 80.0–100.0)
Platelets: 299 10*3/uL (ref 150–400)
RBC: 4.45 MIL/uL (ref 4.22–5.81)
RDW: 13.1 % (ref 11.5–15.5)
WBC: 18.9 10*3/uL — ABNORMAL HIGH (ref 4.0–10.5)
nRBC: 0 % (ref 0.0–0.2)

## 2022-10-20 LAB — COMPREHENSIVE METABOLIC PANEL
ALT: 25 U/L (ref 0–44)
AST: 33 U/L (ref 15–41)
Albumin: 3.3 g/dL — ABNORMAL LOW (ref 3.5–5.0)
Alkaline Phosphatase: 33 U/L — ABNORMAL LOW (ref 38–126)
Anion gap: 7 (ref 5–15)
BUN: 12 mg/dL (ref 6–20)
CO2: 26 mmol/L (ref 22–32)
Calcium: 8.2 mg/dL — ABNORMAL LOW (ref 8.9–10.3)
Chloride: 103 mmol/L (ref 98–111)
Creatinine, Ser: 0.77 mg/dL (ref 0.61–1.24)
GFR, Estimated: >60 mL/min (ref >60)
Glucose, Bld: 123 mg/dL — ABNORMAL HIGH (ref 70–99)
Potassium: 4 mmol/L (ref 3.5–5.1)
Sodium: 136 mmol/L (ref 135–145)
Total Bilirubin: 0.8 mg/dL (ref 0.3–1.2)
Total Protein: 6.7 g/dL (ref 6.5–8.1)

## 2022-10-20 LAB — MAGNESIUM: Magnesium: 2.2 mg/dL (ref 1.7–2.4)

## 2022-10-20 MED ORDER — FUROSEMIDE 10 MG/ML IJ SOLN
20.0000 mg | Freq: Once | INTRAMUSCULAR | Status: AC
  Administered 2022-10-20: 20 mg via INTRAVENOUS
  Filled 2022-10-20: qty 2

## 2022-10-20 MED ORDER — METOPROLOL SUCCINATE ER 25 MG PO TB24
25.0000 mg | ORAL_TABLET | Freq: Every day | ORAL | Status: DC
  Administered 2022-10-20: 25 mg via ORAL

## 2022-10-20 MED ORDER — METOPROLOL TARTRATE 5 MG/5ML IV SOLN
2.5000 mg | Freq: Once | INTRAVENOUS | Status: AC
  Administered 2022-10-20: 2.5 mg via INTRAVENOUS
  Filled 2022-10-20: qty 5

## 2022-10-20 NOTE — Progress Notes (Signed)
AF with RVR  No amio can be given on tikosyn   Apparently there was a refusal of AM Beta blocker, it was requested to be given at night   Additional 2.5 Lo pressor ordered - discussed with EP MD as well.

## 2022-10-20 NOTE — Progress Notes (Signed)
Pt HR sustaining 140-160's-BP 106/74  EKG done Afib-RVR- paged Dr Tenny Craw with this information.

## 2022-10-20 NOTE — Progress Notes (Signed)
Rounding Note    Patient Name: Nathaniel [name] Date of Encounter: 10/20/2022  [place] Cardiologist: Vickie Epley, MD   Subjective   NAEO. Family at bedside. Received dose #1 of dofetilide 10/19/2022 PM. (NEEDS 4 MONITORED DOSES PRIOR TO DISCHARGE)  Inpatient Medications    Scheduled Meds:  acetaminophen  1,000 mg Oral Q6H   Or   acetaminophen (TYLENOL) oral liquid 160 mg/5 mL  1,000 mg Oral Q6H   apixaban  5 mg Oral BID   bisacodyl  10 mg Oral Daily   Chlorhexidine Gluconate Cloth  6 each Topical Daily   colchicine  0.6 mg Oral Daily   dofetilide  500 mcg Oral BID   metoprolol succinate  25 mg Oral Daily   pantoprazole  40 mg Oral Daily   polyethylene glycol  17 g Oral Daily   sodium chloride flush  3 mL Intravenous Q12H   Continuous Infusions:  sodium chloride     PRN Meds: sodium chloride, hydrALAZINE, HYDROmorphone (DILAUDID) injection, oxyCODONE, polyethylene glycol, sodium chloride flush   Vital Signs    Vitals:   10/19/22 2004 10/19/22 2100 10/19/22 2320 10/20/22 0331  BP: (!) 141/71 118/78 116/72 (!) 105/54  Pulse: 85 83 84 79  Resp: 20 (!) 27 20 16  $ Temp: 98.8 F (37.1 C)  98.4 F (36.9 C) 98.5 F (36.9 C)  TempSrc: Oral  Oral Oral  SpO2: 96% 95% 94% 92%  Weight:    (!) 144.2 kg  Height:        Intake/Output Summary (Last 24 hours) at 10/20/2022 0605 Last data filed at 10/20/2022 0340 Gross per 24 hour  Intake 690.81 ml  Output 138 ml  Net 552.81 ml      10/20/2022    3:31 AM 10/19/2022   10:10 AM 10/18/2022    8:30 PM  Last 3 Weights  Weight (lbs) 317 lb 14.5 oz 317 lb 4.8 oz 319 lb 10.7 oz  Weight (kg) 144.2 kg 143.926 kg 145 kg      Telemetry    Personally Reviewed  ECG    No post dose ECG available  Physical Exam   GEN: No acute distress.   Cardiac: RRR, no murmurs, rubs, or gallops.  Psych: Normal affect   Labs    High Sensitivity Troponin:  No results for input(s): "TROPONINIHS" in the last 720  hours.   Chemistry Recent Labs  Lab 10/14/22 1552 10/18/22 1350 10/18/22 1353 10/19/22 0030 10/20/22 0024  NA 139 142 141 138 136  K 3.9 3.9 3.9 4.0 4.0  CL 108 106  --  106 103  CO2 23  --   --  23 26  GLUCOSE 95 101*  --  160* 123*  BUN 10 12  --  9 12  CREATININE 0.71 0.60*  --  0.87 0.77  CALCIUM 8.7*  --   --  8.4* 8.2*  MG  --   --   --  1.7 2.2  PROT 7.2  --   --   --  6.7  ALBUMIN 3.9  --   --   --  3.3*  AST 25  --   --   --  33  ALT 31  --   --   --  25  ALKPHOS 41  --   --   --  33*  BILITOT 0.5  --   --   --  0.8  GFRNONAA >60  --   --  >60 >60  ANIONGAP 8  --   --  9 7    Lipids No results for input(s): "CHOL", "TRIG", "HDL", "LABVLDL", "LDLCALC", "CHOLHDL" in the last 168 hours.  Hematology Recent Labs  Lab 10/14/22 1552 10/18/22 1350 10/18/22 1353 10/19/22 0030 10/20/22 0024  WBC 6.7  --   --  17.9* 18.9*  RBC 4.76  --   --  4.70 4.45  HGB 14.1   < > 12.6* 13.8 13.1  HCT 42.5   < > 37.0* 42.5 40.9  MCV 89.3  --   --  90.4 91.9  MCH 29.6  --   --  29.4 29.4  MCHC 33.2  --   --  32.5 32.0  RDW 12.5  --   --  12.7 13.1  PLT 330  --   --  309 299   < > = values in this interval not displayed.   Thyroid No results for input(s): "TSH", "FREET4" in the last 168 hours.  BNPNo results for input(s): "BNP", "PROBNP" in the last 168 hours.  DDimer No results for input(s): "DDIMER" in the last 168 hours.   Radiology    DG Chest Port 1 View  Result Date: 10/19/2022 CLINICAL DATA:  Status post cardiac surgery EXAM: PORTABLE CHEST 1 VIEW COMPARISON:  Chest radiograph dated 10/18/2022 FINDINGS: Lines/tubes: Interval repositioning of the left-sided chest tube with tip projecting over the lower left upper lung, previously at the apex. Chest: Low lung volumes with bibasilar patchy and linear opacities. Pleura: No pneumothorax or pleural effusion. Heart/mediastinum: Similar enlarged postsurgical cardiomediastinal silhouette with left atrial appendage clip. Bones: No  acute osseous abnormality. IMPRESSION: 1. Interval repositioning of the left-sided chest tube with tip projecting over the lower left upper lung, previously at the apex. No pneumothorax. 2. Low lung volumes with bibasilar atelectasis. Electronically Signed   By: Darrin Nipper M.D.   On: 10/19/2022 08:07   DG Chest Port 1 View  Result Date: 10/18/2022 CLINICAL DATA:  Status post left atrial appendage clipping EXAM: PORTABLE CHEST 1 VIEW COMPARISON:  10/14/2022 FINDINGS: Cardiac shadow is stable. Left atrial appendage clip is noted. Left-sided chest tube is noted somewhat coiled upon itself within the left hemithorax. No pneumothorax is seen. Overall inspiratory effort is poor with crowding of the vascular markings. IMPRESSION: No evidence of pneumothorax. Postsurgical changes with poor inspiratory effort. Electronically Signed   By: Inez Catalina M.D.   On: 10/18/2022 18:57   EP STUDY  Result Date: 10/18/2022 See surgical note for result.  DG C-Arm 1-60 Min-No Report  Result Date: 10/18/2022 Fluoroscopy was utilized by the requesting physician.  No radiographic interpretation.      Assessment & Plan    #Persistent atrial fibrillation #High risk medication management-Tikosyn Maintaining sinus rhythm after convergent procedure by Dr. Tenny Craw. Will plan for mini reload of dofetilide-4 total doses.   1st dose was 10/19/2022 PM. No post dose ECG NEED REPEAT ECG PRIOR TO DOSE #2   Continue anticoagulation.   Daily EKGs after each Tikosyn dose and daily BMP and magnesium checks.  RN staff aware of importance of post dose ECG for QTc monitoring.     #Postop care Per CTS   For questions or updates, please contact [place] Please consult [email or web address] for contact info under        Signed, Vickie Epley, MD  10/20/2022, 6:05 AM

## 2022-10-20 NOTE — Progress Notes (Signed)
EKG done this AM 06:18 is reviewed. QTc is OK Continue Tikosyn same dose this morning.  Tommye Standard, PA-C

## 2022-10-20 NOTE — Progress Notes (Signed)
Post dose EKG is reviewed SR 87bpm QTc 478m Continue Tikosyn same dose  If QT remains stable after 4 doses, anticipate OK to discharge from EP perspective tomorrow afternoon  RTommye Standard PA-C

## 2022-10-20 NOTE — Progress Notes (Signed)
Mobility Specialist Progress Note:   10/20/22 0943  Mobility  Activity Ambulated with assistance in hallway  Level of Assistance Independent after set-up  Assistive Device None  Distance Ambulated (ft) 580 ft  Activity Response Tolerated well  $Mobility charge 1 Mobility   Pre- Mobility:93% SpO2 (2L) During Mobility: 91% SpO2 (2L) Post Mobility:  92%SpO2 (2L)  Pt in bed willing to participate in mobility. No complaints of pain. Left EOB with call bell in reach and all needs met.   Nathaniel [name] Lynda Capistran Mobility Specialist Please contact via [employer] or  Rehab Office at [phone]

## 2022-10-20 NOTE — Progress Notes (Addendum)
Pt had short burst of SVT heart rate as high as 197 bpm. Pt was sleeping. Awoke and alert/oriented after going into the room. Heart rate decreased to NSR in the 90's after about 45 mins. BP 123/61 80MAP. Ewell Poe, PA notified.

## 2022-10-20 NOTE — Progress Notes (Addendum)
      [address]       [place] [address]             [phone]      2 Days Post-Op Procedure(s) (LRB): CONVERGENT PROCEDURE (N/A) CLIPPING OF LEFT ATRIAL APPENDAGE (N/A) TRANSESOPHAGEAL ECHOCARDIOGRAM (N/A) Subjective: Out of bed walking around in his room. Pain control reasonable on oral medications.    Objective: Vital signs in last 24 hours: Temp:  [97.8 F (36.6 C)-98.8 F (37.1 C)] 98.5 F (36.9 C) (02/21 0331) Pulse Rate:  [76-85] 79 (02/21 0331) Cardiac Rhythm: Normal sinus rhythm (02/21 0722) Resp:  [12-27] 16 (02/21 0331) BP: (105-141)/(54-80) 105/54 (02/21 0331) SpO2:  [89 %-96 %] 92 % (02/21 0331) Weight:  [143.9 kg-144.2 kg] 144.2 kg (02/21 0331)    Intake/Output from previous day: 02/20 0701 - 02/21 0700 In: 690.8 [P.O.:600; I.V.:40.8; IV Piggyback:50.1] Out: 138 [Drains:80; Chest Tube:58] Intake/Output this shift: No intake/output data recorded.  General appearance: alert, cooperative, and mild distress Neurologic: intact Heart: remains in SR. The mediastinal JP drained 41m for past 24 hours.  Lungs: respirations non-labored on RA Wound: incisions dry.    Lab Results: Recent Labs    10/19/22 0030 10/20/22 0024  WBC 17.9* 18.9*  HGB 13.8 13.1  HCT 42.5 40.9  PLT 309 299    BMET:  Recent Labs    10/19/22 0030 10/20/22 0024  NA 138 136  K 4.0 4.0  CL 106 103  CO2 23 26  GLUCOSE 160* 123*  BUN 9 12  CREATININE 0.87 0.77  CALCIUM 8.4* 8.2*     PT/INR: No results for input(s): "LABPROT", "INR" in the last 72 hours. ABG    Component Value Date/Time   PHART 7.325 (L) 10/18/2022 1353   HCO3 24.1 10/18/2022 1353   TCO2 25 10/18/2022 1353   ACIDBASEDEF 2.0 10/18/2022 1353   O2SAT 100 10/18/2022 1353   CBG (last 3)  No results for input(s): "GLUCAP" in the last 72 hours.  Assessment/Plan: S/P Procedure(s) (LRB): CONVERGENT PROCEDURE (N/A) CLIPPING OF LEFT ATRIAL APPENDAGE (N/A) TRANSESOPHAGEAL  ECHOCARDIOGRAM (N/A)  -POD-2 Convergent procedure and left VATS with clipping of the left atrial appendage.  Stable SR since the procedure. Continuing anticoagulation with apixaban.  Metoprolol resumed.  Appreciate EP assistance with managing Tikosyn.  Mr. MAlcidewill need to remain an inpatient for close cardiac monitoring through 4th dose of Tikosyn per EP.     LOS: 2 days    MAntony Odea PA-C [phone]/21/2024

## 2022-10-20 NOTE — Progress Notes (Signed)
Pharmacy: Dofetilide (Tikosyn) - Follow Up Assessment and Electrolyte Replacement  Pharmacy consulted to assist in monitoring and replacing electrolytes in this 32 y.o. male admitted on 10/18/2022 undergoing dofetilide re-initiation. First dofetilide dose: 10/19/22 @ 2100  Labs:    Component Value Date/Time   K 4.0 10/20/2022 0024   MG 2.2 10/20/2022 0024     Plan: Potassium: K >/= 4: No additional supplementation needed  Magnesium: Mg > 2: No additional supplementation needed   Thank you for allowing pharmacy to be a part of this patient's care.  Ardyth Harps, PharmD Clinical Pharmacist

## 2022-10-20 NOTE — Progress Notes (Signed)
CARDIAC REHAB PHASE I   Pt resting in bed feeling well today. Ambulating in hall, reports tolerating well. Reviewed CRP2 outpatient services. Pt would like to be referred to program. Will send referral to [facility].   [id number] Vanessa Barbara, RN BSN 10/20/2022 12:23 PM

## 2022-10-20 NOTE — Progress Notes (Addendum)
Occupational Therapy Treatment Patient Details Name: Nathaniel [name] MRN: [id number] DOB: 1990/12/19 Today's Date: 10/20/2022   History of present illness 32 yo male admitted 2/19 for convergent ablation due to persistent Afib s/p 2 prior ablations (2017/2021). PMhx: cardiomyopathy, CHF, depression, pancreatitis   OT comments  Pt progressing towards goals this session, completing simulated tub transfer and hallway ambulation with supervision, SpO2 90-93% on RA throughout session. Reiterated use of incentive spirometer as pt reporting some SOB with mobility. Pt presenting with impairments listed below, will follow acutely. Anticipate no OT follow up needs at d/c.   Recommendations for follow up therapy are one component of a multi-disciplinary discharge planning process, led by the attending physician.  Recommendations may be updated based on patient status, additional functional criteria and insurance authorization.    Follow Up Recommendations  No OT follow up     Assistance Recommended at Discharge PRN  Patient can return home with the following  A little help with walking and/or transfers;A little help with bathing/dressing/bathroom;Assistance with cooking/housework;Assist for transportation;Help with stairs or ramp for entrance   Equipment Recommendations  None recommended by OT    Recommendations for Other Services PT consult    Precautions / Restrictions Precautions Precautions: Other (comment) Precaution Comments: watch O2, JP drain Restrictions Weight Bearing Restrictions: No       Mobility Bed Mobility Overal bed mobility: Modified Independent                  Transfers Overall transfer level: Needs assistance   Transfers: Sit to/from Stand Sit to Stand: Supervision                 Balance Overall balance assessment: No apparent balance deficits (not formally assessed)                                         ADL either  performed or assessed with clinical judgement   ADL Overall ADL's : Needs assistance/impaired                                 Tub/ Shower Transfer: Supervision/safety;Ambulation;Tub transfer   Functional mobility during ADLs: Supervision/safety      Extremity/Trunk Assessment Upper Extremity Assessment Upper Extremity Assessment: Overall WFL for tasks assessed   Lower Extremity Assessment Lower Extremity Assessment: Defer to PT evaluation        Vision   Vision Assessment?: No apparent visual deficits   Perception Perception Perception: Not tested   Praxis Praxis Praxis: Not tested    Cognition Arousal/Alertness: Awake/alert Behavior During Therapy: WFL for tasks assessed/performed Overall Cognitive Status: Within Functional Limits for tasks assessed                                          Exercises      Shoulder Instructions       General Comments SpO2 90-93% on RA with hallway ambulation    Pertinent Vitals/ Pain       Pain Assessment Pain Assessment: Faces Pain Score: 3  Faces Pain Scale: Hurts a little bit Pain Location: chest/abdomen Pain Descriptors / Indicators: Discomfort Pain Intervention(s): Limited activity within patient's tolerance, Monitored during session, Repositioned  Home Living  Prior Functioning/Environment              Frequency  Min 2X/week        Progress Toward Goals  OT Goals(current goals can now be found in the care plan section)  Progress towards OT goals: Progressing toward goals  Acute Rehab OT Goals Patient Stated Goal: none stated OT Goal Formulation: With patient Time For Goal Achievement: 11/02/22 Potential to Achieve Goals: Good ADL Goals Pt Will Perform Upper Body Dressing: with modified independence;sitting Pt Will Perform Lower Body Dressing: with modified independence;sit to/from stand;sitting/lateral  leans Pt Will Transfer to Toilet: with modified independence;ambulating;regular height toilet Pt Will Perform Tub/Shower Transfer: Tub transfer;Shower transfer;with modified independence;ambulating Additional ADL Goal #1: pt will perform 2 standing functional tasks with SpO2 above 90% in order to improve activity tolerance for ADLs  Plan Discharge plan remains appropriate;Frequency remains appropriate    Co-evaluation                 AM-PAC OT "6 Clicks" Daily Activity     Outcome Measure   Help from another person eating meals?: None Help from another person taking care of personal grooming?: None Help from another person toileting, which includes using toliet, bedpan, or urinal?: A Little Help from another person bathing (including washing, rinsing, drying)?: A Little Help from another person to put on and taking off regular upper body clothing?: None Help from another person to put on and taking off regular lower body clothing?: A Little 6 Click Score: 21    End of Session    OT Visit Diagnosis: Unsteadiness on feet (R26.81);Other abnormalities of gait and mobility (R26.89);Muscle weakness (generalized) (M62.81)   Activity Tolerance Patient tolerated treatment well   Patient Left in bed;with call bell/phone within reach   Nurse Communication Mobility status;Other (comment) (SpO2 90-93% on RA, O2 left off at end of session)        Time: [id number] OT Time Calculation (min): 14 min  Charges: OT General Charges $OT Visit: 1 Visit OT Treatments $Therapeutic Activity: 8-22 mins  Renaye Rakers, OTD, OTR/L SecureChat Preferred Acute Rehab [phone]   Ulla Gallo 10/20/2022, 12:18 PM

## 2022-10-21 DIAGNOSIS — I4819 Other persistent atrial fibrillation: Secondary | ICD-10-CM | POA: Diagnosis not present

## 2022-10-21 LAB — BASIC METABOLIC PANEL
Anion gap: 10 (ref 5–15)
BUN: 12 mg/dL (ref 6–20)
CO2: 27 mmol/L (ref 22–32)
Calcium: 8 mg/dL — ABNORMAL LOW (ref 8.9–10.3)
Chloride: 103 mmol/L (ref 98–111)
Creatinine, Ser: 0.77 mg/dL (ref 0.61–1.24)
GFR, Estimated: >60 mL/min (ref >60)
Glucose, Bld: 96 mg/dL (ref 70–99)
Potassium: 3.7 mmol/L (ref 3.5–5.1)
Sodium: 140 mmol/L (ref 135–145)

## 2022-10-21 LAB — MAGNESIUM: Magnesium: 1.9 mg/dL (ref 1.7–2.4)

## 2022-10-21 MED ORDER — METOPROLOL SUCCINATE ER 50 MG PO TB24: 50.0000 mg | ORAL_TABLET | Freq: Every day | ORAL | Status: DC

## 2022-10-21 MED ORDER — METOPROLOL SUCCINATE ER 50 MG PO TB24: 50.0000 mg | ORAL_TABLET | Freq: Every day | ORAL | 2 refills | Status: DC

## 2022-10-21 MED ORDER — POTASSIUM CHLORIDE CRYS ER 20 MEQ PO TBCR
20.0000 meq | EXTENDED_RELEASE_TABLET | ORAL | Status: AC
  Administered 2022-10-21 (×2): 20 meq via ORAL
  Filled 2022-10-21 (×2): qty 1

## 2022-10-21 MED ORDER — OXYCODONE HCL 5 MG PO TABS: 5.0000 mg | ORAL_TABLET | Freq: Four times a day (QID) | ORAL | 0 refills | Status: AC | PRN

## 2022-10-21 MED ORDER — COLCHICINE 0.6 MG PO TABS: 0.6000 mg | ORAL_TABLET | Freq: Every day | ORAL | 0 refills | Status: DC

## 2022-10-21 MED ORDER — CLONAZEPAM 0.5 MG PO TABS
0.5000 mg | ORAL_TABLET | Freq: Two times a day (BID) | ORAL | Status: DC
  Administered 2022-10-21: 0.5 mg via ORAL
  Filled 2022-10-21: qty 1

## 2022-10-21 MED ORDER — MAGNESIUM SULFATE 2 GM/50ML IV SOLN
2.0000 g | Freq: Once | INTRAVENOUS | Status: AC
  Administered 2022-10-21: 2 g via INTRAVENOUS
  Filled 2022-10-21: qty 50

## 2022-10-21 MED ORDER — METHYLPREDNISOLONE 4 MG PO TBPK: ORAL_TABLET | ORAL | 0 refills | Status: DC

## 2022-10-21 MED ORDER — POLYETHYLENE GLYCOL 3350 17 G PO PACK: 17.0000 g | PACK | Freq: Every day | ORAL | 0 refills | Status: AC

## 2022-10-21 NOTE — Plan of Care (Signed)
  Problem: Education: Goal: Knowledge of disease or condition will improve Outcome: Adequate for Discharge Goal: Knowledge of the prescribed therapeutic regimen will improve Outcome: Adequate for Discharge   Problem: Activity: Goal: Risk for activity intolerance will decrease Outcome: Adequate for Discharge   Problem: Cardiac: Goal: Will achieve and/or maintain hemodynamic stability Outcome: Adequate for Discharge   Problem: Clinical Measurements: Goal: Postoperative complications will be avoided or minimized Outcome: Adequate for Discharge   Problem: Respiratory: Goal: Respiratory status will improve Outcome: Adequate for Discharge   Problem: Pain Management: Goal: Pain level will decrease Outcome: Adequate for Discharge   Problem: Skin Integrity: Goal: Wound healing without signs and symptoms infection will improve Outcome: Adequate for Discharge   Problem: Education: Goal: Knowledge of General Education information will improve Description: Including pain rating scale, medication(s)/side effects and non-pharmacologic comfort measures Outcome: Adequate for Discharge   Problem: Health Behavior/Discharge Planning: Goal: Ability to manage health-related needs will improve Outcome: Adequate for Discharge   Problem: Clinical Measurements: Goal: Ability to maintain clinical measurements within normal limits will improve Outcome: Adequate for Discharge Goal: Will remain free from infection Outcome: Adequate for Discharge Goal: Diagnostic test results will improve Outcome: Adequate for Discharge Goal: Respiratory complications will improve Outcome: Adequate for Discharge Goal: Cardiovascular complication will be avoided Outcome: Adequate for Discharge   Problem: Activity: Goal: Risk for activity intolerance will decrease Outcome: Adequate for Discharge   Problem: Nutrition: Goal: Adequate nutrition will be maintained Outcome: Adequate for Discharge   Problem:  Coping: Goal: Level of anxiety will decrease Outcome: Adequate for Discharge   Problem: Elimination: Goal: Will not experience complications related to bowel motility Outcome: Adequate for Discharge Goal: Will not experience complications related to urinary retention Outcome: Adequate for Discharge   Problem: Pain Managment: Goal: General experience of comfort will improve Outcome: Adequate for Discharge   Problem: Safety: Goal: Ability to remain free from injury will improve Outcome: Adequate for Discharge   Problem: Skin Integrity: Goal: Risk for impaired skin integrity will decrease Outcome: Adequate for Discharge   

## 2022-10-21 NOTE — Progress Notes (Signed)
Post dose EKG is reviewed and stable Telemetry remains SR PACs OK to discharged from EP perspective Continue home dofetilide 558mg BID Increase Toprol to 572mdaily Stop diltiazem No new or additional electrolyte replacement needed  We will make AFib clinic follow up for him  ReTommye StandardPA-C

## 2022-10-21 NOTE — Progress Notes (Signed)
Pharmacy: Dofetilide (Tikosyn) - Follow Up Assessment and Electrolyte Replacement  Pharmacy consulted to assist in monitoring and replacing electrolytes in this 32 y.o. male admitted on 10/18/2022 undergoing dofetilide re-initiation. First dofetilide dose: 10/19/22 @ 2100  Labs:    Component Value Date/Time   K 3.7 10/21/2022 0529   MG 1.9 10/21/2022 0529     Plan: Potassium: K 3.8-3.9:  Give KCl 40 mEq po x1   Magnesium: Mg 1.8-2: Give Mg 2 gm IV x1    Thank you for allowing pharmacy to be a part of this patient's care.  Ardyth Harps, PharmD Clinical Pharmacist

## 2022-10-21 NOTE — Progress Notes (Signed)
      [address]       [place] [address]             [phone]      3 Days Post-Op Procedure(s) (LRB): CONVERGENT PROCEDURE (N/A) CLIPPING OF LEFT ATRIAL APPENDAGE (N/A) TRANSESOPHAGEAL ECHOCARDIOGRAM (N/A) Subjective: In the bedside chair, says he feels good.   Had a few hours of A-fib with RVR last evening.  Converted back to SR after IV metolprolol x 1 dose.   Objective: Vital signs in last 24 hours: Temp:  [97.8 F (36.6 C)-99.3 F (37.4 C)] 98.4 F (36.9 C) (02/22 0617) Pulse Rate:  [73-93] 73 (02/22 0617) Cardiac Rhythm: Atrial fibrillation (02/21 2030) Resp:  [15-24] 21 (02/22 0617) BP: (102-114)/(53-79) 112/53 (02/22 0617) SpO2:  [91 %-96 %] 96 % (02/22 0617) Weight:  [142.7 kg] 142.7 kg (02/22 0500)    Intake/Output from previous day: 02/21 0701 - 02/22 0700 In: 720 [P.O.:720] Out: 530 [Urine:525; Drains:5] Intake/Output this shift: No intake/output data recorded.  General appearance: alert, cooperative, and no distress Neurologic: intact Heart: a-fib last night with RVR, back in SR. The mediastinal JP drained 70m for past 24 hours.  Lungs: respirations non-labored on RA Wound: the sub-xiphoid incision is dr, having some serous drainage.    Lab Results: Recent Labs    10/19/22 0030 10/20/22 0024  WBC 17.9* 18.9*  HGB 13.8 13.1  HCT 42.5 40.9  PLT 309 299    BMET:  Recent Labs    10/20/22 0024 10/21/22 0529  NA 136 140  K 4.0 3.7  CL 103 103  CO2 26 27  GLUCOSE 123* 96  BUN 12 12  CREATININE 0.77 0.77  CALCIUM 8.2* 8.0*     PT/INR: No results for input(s): "LABPROT", "INR" in the last 72 hours. ABG    Component Value Date/Time   PHART 7.325 (L) 10/18/2022 1353   HCO3 24.1 10/18/2022 1353   TCO2 25 10/18/2022 1353   ACIDBASEDEF 2.0 10/18/2022 1353   O2SAT 100 10/18/2022 1353   CBG (last 3)  No results for input(s): "GLUCAP" in the last 72 hours.  Assessment/Plan: S/P Procedure(s) (LRB): CONVERGENT  PROCEDURE (N/A) CLIPPING OF LEFT ATRIAL APPENDAGE (N/A) TRANSESOPHAGEAL ECHOCARDIOGRAM (N/A)  -POD-3 Convergent procedure and left VATS with clipping of the left atrial appendage.  Had a few hours of a-fib with RVR last evening that converted after one dose of IV metoprolol. Stable SR since then.  K+ 3.7 and Mg++ 1.9 this am. Replacements ordered.  Continuing anticoagulation with apixaban and metoprolol.   Appreciate EP assistance with managing Tikosyn.  To get 4th loading dose of Tikosyn this am.  Possible discharge afternoon if rhythm and QT stable.    LOS: 3 days    MAntony Odea PA-C [id number]/22/2024

## 2022-10-21 NOTE — Progress Notes (Signed)
Rounding Note    Patient Name: Nathaniel [name] Date of Encounter: 10/21/2022  [place] Cardiologist: Vickie Epley, MD   Subjective   Feels well this AM, hoping to go home soon.  Inpatient Medications    Scheduled Meds:  acetaminophen  1,000 mg Oral Q6H   Or   acetaminophen (TYLENOL) oral liquid 160 mg/5 mL  1,000 mg Oral Q6H   apixaban  5 mg Oral BID   bisacodyl  10 mg Oral Daily   Chlorhexidine Gluconate Cloth  6 each Topical Daily   clonazePAM  0.5 mg Oral BID   colchicine  0.6 mg Oral Daily   dofetilide  500 mcg Oral BID   metoprolol succinate  50 mg Oral Q2000   pantoprazole  40 mg Oral Daily   polyethylene glycol  17 g Oral Daily   potassium chloride  20 mEq Oral Q4H   sodium chloride flush  3 mL Intravenous Q12H   Continuous Infusions:  sodium chloride     magnesium sulfate bolus IVPB 2 g (10/21/22 0824)   PRN Meds: sodium chloride, hydrALAZINE, HYDROmorphone (DILAUDID) injection, oxyCODONE, polyethylene glycol, sodium chloride flush   Vital Signs    Vitals:   10/21/22 0000 10/21/22 0500 10/21/22 0617 10/21/22 0800  BP: 103/62  (!) 112/53 120/89  Pulse: 79  73 84  Resp: (!) 24  (!) 21 20  Temp: 99.3 F (37.4 C)  98.4 F (36.9 C) 98.3 F (36.8 C)  TempSrc: Oral  Oral Oral  SpO2: 95%  96%   Weight:  (!) 142.7 kg    Height:        Intake/Output Summary (Last 24 hours) at 10/21/2022 0844 Last data filed at 10/21/2022 0801 Gross per 24 hour  Intake 960 ml  Output 530 ml  Net 430 ml      10/21/2022    5:00 AM 10/20/2022    3:31 AM 10/19/2022   10:10 AM  Last 3 Weights  Weight (lbs) 314 lb 9.6 oz 317 lb 14.5 oz 317 lb 4.8 oz  Weight (kg) 142.702 kg 144.2 kg 143.926 kg      Telemetry    SR 80's, had AFib/flutter with RVR last evening - Personally Reviewed  ECG    Coarse AFib 151 Post dose is SR 86bpm, QTc 470m  - Personally Reviewed with Dr. LQuentin Ore Physical Exam   GEN: No acute distress.   Neck: No JVD Cardiac:  RRR, no murmurs, rubs, or gallops.  JP drain remains Respiratory: CTA b/l. GI: Soft, nontender, non-distended  MS: No edema; No deformity. Neuro:  Nonfocal  Psych: Normal affect   Labs    High Sensitivity Troponin:  No results for input(s): "TROPONINIHS" in the last 720 hours.   Chemistry Recent Labs  Lab 10/14/22 1552 10/18/22 1350 10/19/22 0030 10/20/22 0024 10/21/22 0529  NA 139   < > 138 136 140  K 3.9   < > 4.0 4.0 3.7  CL 108   < > 106 103 103  CO2 23  --  23 26 27  $ GLUCOSE 95   < > 160* 123* 96  BUN 10   < > 9 12 12  $ CREATININE 0.71   < > 0.87 0.77 0.77  CALCIUM 8.7*  --  8.4* 8.2* 8.0*  MG  --   --  1.7 2.2 1.9  PROT 7.2  --   --  6.7  --   ALBUMIN 3.9  --   --  3.3*  --  AST 25  --   --  33  --   ALT 31  --   --  25  --   ALKPHOS 41  --   --  33*  --   BILITOT 0.5  --   --  0.8  --   GFRNONAA >60  --  >60 >60 >60  ANIONGAP 8  --  9 7 10   $ < > = values in this interval not displayed.    Lipids No results for input(s): "CHOL", "TRIG", "HDL", "LABVLDL", "LDLCALC", "CHOLHDL" in the last 168 hours.  Hematology Recent Labs  Lab 10/14/22 1552 10/18/22 1350 10/18/22 1353 10/19/22 0030 10/20/22 0024  WBC 6.7  --   --  17.9* 18.9*  RBC 4.76  --   --  4.70 4.45  HGB 14.1   < > 12.6* 13.8 13.1  HCT 42.5   < > 37.0* 42.5 40.9  MCV 89.3  --   --  90.4 91.9  MCH 29.6  --   --  29.4 29.4  MCHC 33.2  --   --  32.5 32.0  RDW 12.5  --   --  12.7 13.1  PLT 330  --   --  309 299   < > = values in this interval not displayed.   Thyroid No results for input(s): "TSH", "FREET4" in the last 168 hours.  BNPNo results for input(s): "BNP", "PROBNP" in the last 168 hours.  DDimer No results for input(s): "DDIMER" in the last 168 hours.   Radiology    No results found.  Cardiac Studies   11/09/19: EPS/ablation CONCLUSIONS: 1. Sinus rhythm upon presentation.   2. Intracardiac echo reveals a moderate sized left atrium with four separate pulmonary veins without evidence  of pulmonary vein stenosis.  There was a common ostium to the left PVs.  The right superior PV was large. 3. Return of electric conduction within the right superior pulmonary vein at baseline, more prominent after adenosine infusion.  The right inferior, left superior and left inferior PVs were quiescent from a prior ablation and did not require additional ablation today. 4. Successful electrical reisolation of the right superior pulmonary vein with radiofrequency current.    5. Right atrial flutter induced with isuprel infusion and catheter manipulation which spontaneously terminated and was not sufficient in duration for mapping. 6. Return of conduction along the previously ablated Cavo-tricuspid isthmus 7. Ablation was performed along the CTI today with complete bidirectional isthmus block achieved.  8. No inducible sustained arrhythmias following ablation both on and off of Isuprel 9. No early apparent complications.     01/25/2019: TTE 1. The left ventricle has a visually estimated ejection fraction of 55%.  The cavity size was normal. Left ventricular diastolic parameters were  normal.   2. The right ventricle has normal systolic function. The cavity was  normal. There is no increase in right ventricular wall thickness.   3. The mitral valve is grossly normal. There is mild mitral annular  calcification present.   4. The tricuspid valve is grossly normal.   5. The aortic valve is tricuspid.   6. The aortic root is normal in size and structure.    11/08/21 EPS/Ablation (re-do PVI, + CTI) CONCLUSIONS: 1. Sinus rhythm upon presentation.   2. Intracardiac echo reveals a moderate sized left atrium with four separate pulmonary veins without evidence of pulmonary vein stenosis.  There was a common ostium to the left PVs.  The right superior PV was large.  3. Return of electric conduction within the right superior pulmonary vein at baseline, more prominent after adenosine infusion.  The right  inferior, left superior and left inferior PVs were quiescent from a prior ablation and did not require additional ablation today. 4. Successful electrical reisolation of the right superior pulmonary vein with radiofrequency current.    5. Right atrial flutter induced with isuprel infusion and catheter manipulation which spontaneously terminated and was not sufficient in duration for mapping. 6. Return of conduction along the previously ablated Cavo-tricuspid isthmus 7. Ablation was performed along the CTI today with complete bidirectional isthmus block achieved.  8. No inducible sustained arrhythmias following ablation both on and off of Isuprel 9. No early apparent complications.   12/09/2016: EPS/Ablation CONCLUSIONS: 1. Sinus rhythm upon presentation.   2. Intracardiac echo reveals a short common ostium to the left superior and inferior pulmonary veins.  These were moderate in size.  The right superior pulmonary vein was very large in sinus and had two large branches.  The right inferior pulmonary vein was small.  3. Successful electrical isolation and anatomical encircling of all four pulmonary veins with radiofrequency current.    4. Cavo-tricuspid isthmus ablation was performed with complete bidirectional isthmus block achieved.  5. No inducible arrhythmias following ablation both on and off of Isuprel 6. No early apparent complications.  Patient Profile     32 y.o. male  with a hx of HTN, morbid obesity, pancreatitis, persistent AFib/flutter w/hx of tachy-mediated CM who is being seen 10/19/2022 for the evaluation of AAD management at the request of Dr. Tenny Craw now s/p Convergent procedure.   AFib Hx PVI/CTI ablation 12/09/2016 (Dr. Rayann Heman) PVI/CTI ablation 11/09/2019 (Dr. Rayann Heman) [facility] started Nov 2023 10/18/22: Convergent procedure/LAA clipping, Dr. Tenny Craw  Assessment & Plan    Persistent AFib CHA2DS2Vasc is zero, on Eliquis POD #3 today s/p Convergent procedure Tikosyn re-load is  in progress K+ 3.7 Mag 1.9 Creat 0.77  QTc stable  He had some RVR last night, not unexpected Increased metoprolol to 48m  4th dose this morning, if QTc remains stable would be ok to discharge from EP perspective  Final recs for meds/lytes will follow his AM dose EKG  Otherwise as per CTS  For questions or updates, please contact CPajarito MesaPlease consult [email or web address] for contact info under        Signed, RBaldwin Jamaica PA-C  10/21/2022, 8:44 AM

## 2022-10-21 NOTE — TOC Transition Note (Signed)
Transition of Care [facility]) - CM/SW Discharge Note   Patient Details  Name: Nathaniel [name] MRN: [id number] Date of Birth: 01-May-1991  [facility] CM/SW Contact:  Zenon Mayo, RN Phone Number: 10/21/2022, 2:18 PM   Clinical Narrative:    Patient is for dc home today, wife will transport him home , he was on eliquis and tikosyn pta.  He has no needs.     Barriers to Discharge: Continued Medical Work up   Patient Goals and CMS Choice      Discharge Placement                         Discharge Plan and Services Additional resources added to the After Visit Summary for     Discharge Planning Services: Medication Assistance            DME Arranged: [place] rolling DME Agency: [employer] Date DME Agency Contacted: 10/20/22 Time DME Agency Contacted: [phone] Representative spoke with at DME Agency: [place] (SDOH) Interventions SDOH Screenings   Depression (PHQ2-9): High Risk (08/12/2022)  Tobacco Use: High Risk (10/19/2022)     Readmission Risk Interventions     No data to display

## 2022-10-21 NOTE — Progress Notes (Signed)
Physical Therapy Treatment Patient Details Name: Nathaniel [name] MRN: [id number] DOB: May 07, 1991 Today's Date: 10/21/2022   History of Present Illness 32 yo male admitted 2/19 for convergent ablation due to persistent Afib s/p 2 prior ablations (2017/2021). PMhx: cardiomyopathy, CHF, depression, pancreatitis.    PT Comments    Pt received sitting EOB, spouse present. Pt agreeable to therapy session with emphasis on stair negotiation, gait training and energy conservation. Pt mostly modI for all tasks with RW and given min safety cues intermittently. Handout printed for energy conservation, his home RW was adjusted for proper height. Pt continues to benefit from PT services to progress toward functional mobility goals.    Recommendations for follow up therapy are one component of a multi-disciplinary discharge planning process, led by the attending physician.  Recommendations may be updated based on patient status, additional functional criteria and insurance authorization.  Follow Up Recommendations  No PT follow up     Assistance Recommended at Discharge Intermittent Supervision/Assistance  Patient can return home with the following A little help with walking and/or transfers;A little help with bathing/dressing/bathroom;Help with stairs or ramp for entrance;Assistance with cooking/housework   Equipment Recommendations  Rolling walker (2 wheels)    Recommendations for Other Services       Precautions / Restrictions Precautions Precautions: Other (comment) Precaution Comments: watch O2 Restrictions Weight Bearing Restrictions: No     Mobility  Bed Mobility Overal bed mobility: Modified Independent             General bed mobility comments: received up sitting EOB with spouse present    Transfers Overall transfer level: Modified independent Equipment used: Rolling walker (2 wheels), None Transfers: Sit to/from Stand Sit to Stand: Modified independent  (Device/Increase time)           General transfer comment: cues not to pull up on RW; pt able to stand/sit without AD as well    Ambulation/Gait Ambulation/Gait assistance: Modified independent (Device/Increase time) Gait Distance (Feet): 50 Feet Assistive device: Rolling walker (2 wheels), None Gait Pattern/deviations: Step-through pattern, Decreased stride length       General Gait Details: pt able to take a few steps without RW but preference for stability and support with RW this session. On RA SpO2 WFL throughtout and HR 98-110 bpm with stair ascent/descent in room; distance limited due to pt upcoming DC and wanting to conserve his energy.   Stairs Stairs: Yes Stairs assistance: Supervision Stair Management: Step to pattern, Forwards, With walker Number of Stairs: 4 General stair comments: RW to simulate railings, 8" step in room x 4 reps; pt able to alternate which leg he ascended/descended with, no difficulty, Supervision for safety and VS monitoring.   Wheelchair Mobility    Modified Rankin (Stroke Patients Only)       Balance Overall balance assessment: No apparent balance deficits (not formally assessed)                                          Cognition Arousal/Alertness: Awake/alert Behavior During Therapy: WFL for tasks assessed/performed Overall Cognitive Status: Within Functional Limits for tasks assessed                                 General Comments: pleasant, cooperative        Exercises Other Exercises Other Exercises: reviewed IS  use and frequency, pt receptive    General Comments General comments (skin integrity, edema, etc.): SpO2 WFL on RA, HR WFL. Pt instructed on activity pacing and energy conservation techniques. Handout printed to reinforce EC.      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 PT Goal Formulation: With patient Time For Goal Achievement: 10/26/22 Progress towards PT goals: Progressing toward goals    Frequency    Min 3X/week      PT Plan Current plan remains appropriate    Co-evaluation              AM-PAC PT "6 Clicks" Mobility   Outcome Measure  Help needed turning from your back to your side while in a flat bed without using bedrails?: None Help needed moving from lying on your back to sitting on the side of a flat bed without using bedrails?: A Little Help needed moving to and from a bed to a chair (including a wheelchair)?: A Little Help needed standing up from a chair using your arms (e.g., wheelchair or bedside chair)?: None Help needed to walk in hospital room?: None Help needed climbing 3-5 steps with a railing? : A Little 6 Click Score: 21    End of Session   Activity Tolerance: Patient tolerated treatment well Patient left: with call bell/phone within reach;with family/visitor present;Other (comment) (pt sitting EOB, dressed, awaiting DC paperwork to be reviewed) Nurse Communication: Mobility status PT Visit Diagnosis: Other abnormalities of gait and mobility (R26.89)     Time: [id number] PT Time Calculation (min) (ACUTE ONLY): 9 min  Charges:  $Gait Training: 8-22 mins                     Blayklee Mable P., PTA Acute Rehabilitation Services Secure Chat Preferred 9a-5:30pm Office: [place] 10/21/2022, 3:51 PM

## 2022-10-22 ENCOUNTER — Other Ambulatory Visit: Payer: 59

## 2022-10-22 ENCOUNTER — Ambulatory Visit: Payer: 59

## 2022-10-24 NOTE — Progress Notes (Unsigned)
/       [address]       [place] [address]             [phone]                    Nyquan T Shaub [place] Medical Record [id number] Date of Birth: [birth year]  Referring: Neale Burly, MD Primary Care: Neale Burly, MD Primary Cardiologist: Vickie Epley, MD  S/P Convergent on 10/18/22 / PHYSICAL EXAMINATION: There were no vitals taken for this visit.  Gen: NAD Neuro: Alert and oriented Chest: incision healing well Resp: Nonlaboured Abd: Soft, ntnd Extr: WWP, 1+ LE edema bilateral  Diagnostic Studies & Laboratory data:    XR today:   Assessment / Plan:   S/P Convergent on 10/18/22  First time in sinus for 48 hours in years Was in a fast fib briefly and converted out with metoprolol  Incisions healing well Is on therapeutic AC Still on Tikosyn May or may not need a catheter ablation in the future Will get him back with Dr. Lars Mage given Tikosyn, futher eval. Wouldn't be opposed to mapping him in 2-3 months and seeing what we are dealing with for LA substrate.    Pierre Bali Shaquella Stamant 10/24/2022 1:28 PM

## 2022-10-27 ENCOUNTER — Other Ambulatory Visit: Payer: Self-pay | Admitting: Cardiothoracic Surgery

## 2022-10-27 DIAGNOSIS — I4891 Unspecified atrial fibrillation: Secondary | ICD-10-CM

## 2022-10-28 ENCOUNTER — Ambulatory Visit
Admission: RE | Admit: 2022-10-28 | Discharge: 2022-10-28 | Disposition: A | Discharge status: Home / Self Care | Payer: 59 | Source: Ambulatory Visit | Attending: Cardiothoracic Surgery | Admitting: Cardiothoracic Surgery

## 2022-10-28 ENCOUNTER — Ambulatory Visit (INDEPENDENT_AMBULATORY_CARE_PROVIDER_SITE_OTHER): Payer: Self-pay | Admitting: Cardiothoracic Surgery

## 2022-10-28 ENCOUNTER — Encounter (HOSPITAL_COMMUNITY): Payer: Self-pay | Admitting: Physician Assistant

## 2022-10-28 ENCOUNTER — Ambulatory Visit (HOSPITAL_COMMUNITY)
Admission: RE | Admit: 2022-10-28 | Discharge: 2022-10-28 | Disposition: A | Discharge status: Home / Self Care | Payer: 59 | Source: Ambulatory Visit | Attending: Physician Assistant | Admitting: Physician Assistant

## 2022-10-28 VITALS — BP 114/74 | HR 83 | Ht 72.0 in | Wt 311.8 lb

## 2022-10-28 VITALS — BP 100/70 | HR 85 | Resp 20 | Ht 72.0 in | Wt 311.0 lb

## 2022-10-28 DIAGNOSIS — I110 Hypertensive heart disease with heart failure: Secondary | ICD-10-CM | POA: Diagnosis not present

## 2022-10-28 DIAGNOSIS — D6869 Other thrombophilia: Secondary | ICD-10-CM | POA: Diagnosis not present

## 2022-10-28 DIAGNOSIS — Z6841 Body Mass Index (BMI) 40.0 and over, adult: Secondary | ICD-10-CM | POA: Insufficient documentation

## 2022-10-28 DIAGNOSIS — I509 Heart failure, unspecified: Secondary | ICD-10-CM | POA: Diagnosis not present

## 2022-10-28 DIAGNOSIS — Z7901 Long term (current) use of anticoagulants: Secondary | ICD-10-CM | POA: Diagnosis not present

## 2022-10-28 DIAGNOSIS — E669 Obesity, unspecified: Secondary | ICD-10-CM | POA: Insufficient documentation

## 2022-10-28 DIAGNOSIS — I4819 Other persistent atrial fibrillation: Secondary | ICD-10-CM | POA: Insufficient documentation

## 2022-10-28 DIAGNOSIS — I480 Paroxysmal atrial fibrillation: Secondary | ICD-10-CM

## 2022-10-28 DIAGNOSIS — Z09 Encounter for follow-up examination after completed treatment for conditions other than malignant neoplasm: Secondary | ICD-10-CM

## 2022-10-28 DIAGNOSIS — I483 Typical atrial flutter: Secondary | ICD-10-CM | POA: Diagnosis not present

## 2022-10-28 DIAGNOSIS — I4891 Unspecified atrial fibrillation: Secondary | ICD-10-CM

## 2022-10-28 DIAGNOSIS — Z79899 Other long term (current) drug therapy: Secondary | ICD-10-CM

## 2022-10-28 LAB — BASIC METABOLIC PANEL
Anion gap: 14 (ref 5–15)
BUN: 11 mg/dL (ref 6–20)
CO2: 20 mmol/L — ABNORMAL LOW (ref 22–32)
Calcium: 8.4 mg/dL — ABNORMAL LOW (ref 8.9–10.3)
Chloride: 105 mmol/L (ref 98–111)
Creatinine, Ser: 0.75 mg/dL (ref 0.61–1.24)
GFR, Estimated: >60 mL/min (ref >60)
Glucose, Bld: 114 mg/dL — ABNORMAL HIGH (ref 70–99)
Potassium: 3.9 mmol/L (ref 3.5–5.1)
Sodium: 139 mmol/L (ref 135–145)

## 2022-10-28 LAB — MAGNESIUM: Magnesium: 2.1 mg/dL (ref 1.7–2.4)

## 2022-10-28 NOTE — Progress Notes (Signed)
Primary Care Physician: Neale Burly, MD Referring Physician: Dr. Rayann Heman Primary EP: Dr [name] is a 32 y.o. male with a h/o paroxysmal afib that is in the afib clinic for increased palpitations. He had a afib ablation in 2018. He has been maintained with flecainide and metoprolol. Is on eliquis 5 mg bid for CHA2DS2VASc score of 2. He has noted increased palpitations since he was hospitalized with acute on chronic  Pancreatitis 10/11/19. [name]  He had a bad episode at work the other night but today is in [place]. He also had covid last summer at which time he has increase of afib episodes.  He is interested in redo ablation if needed.   F/u in afib clinic, 11/28/19. He had redo  ablation 3/12 but sent a my chart message that he was having issues with intermittent afib and asked to  be seen. On arrival, he was in [place]. Pt states that he is scared to go to work as he gets so apprehensive when he goes into afib that he cant function.. His 30 mg Cardizem tab will usually convert him quickly. He has issues with anxiety and panic attacks. He mostly will feel anxiety and then the afib kicks in. He presented in SR but while talking about his anxiety he went into afib by ausculation. No swallowing or groin issues since the procedure. We discussed today as well, that some afib after the procedure can be expected.   F/u in afib clinic, 11/05/21. He saw Dr. Rayann Heman 3 months ago. He states that he still has breakthrough afib. May last a couple of hours. He did get second opinion at [facility] and they discussed doing a Maze procedure and pt  was not ready. Dr. Rayann Heman also has mentioned convergent procedure in the past. Continues on flecainide 150 mg bid.  Eliquis 5 mg bid for a CHA2DS2VASc  score of 2.   Follow up in the AF clinic 07/06/22. Patient presents for dofetilide admission. He continues to go in and out of afib frequently. His dofetilide admission was rescheduled for today as he was having abdominal  pain 2/2 chronic pancreatitis, now resolved. He denies any missed doses of anticoagulation.   Follow up in the AF clinic 07/16/22. Patient is s/p dofetilide loading 11/7-11/10/23. Unfortunately, during his admission and post discharge he has continued to have very frequent afib episodes.   Follow up in the AF clinic 10/28/22. He underwent convergent ablation on October 18, 2022. During the procedure his left atrial appendage was clipped. The procedure was uncomplicated. He did receive Zofran postoperatively and missed 2 doses of Tikosyn. He was quickly reloaded on dofetilide. He reports that he is doing well today and is very happy with the results of his surgery. He has a few minutes of "anxious feeling in his chest" which resolves quickly. He was very appreciate of the care he received.   Today, he denies symptoms of chest pain, shortness of breath, orthopnea, PND, lower extremity edema, dizziness, presyncope, syncope, or neurologic sequela. The patient is tolerating medications without difficulties and is otherwise without complaint today.   Past Medical History:  Diagnosis Date   Acute pancreatitis without infection or necrosis 03/05/2016   Anginal pain (HCC)    Happens around moments of A.Fib   Anxiety    Arthritis    Back   Atrial fibrillation (HCC)    Atrial flutter ([place]) 01/17/2016   Admx 5/17 with AFlutter with RVR in setting of pancreatitis //  CHADS2-VASc=1 (CHF) // Anticoag not started // Amiodarone >> NSR    Cardiomyopathy (HCC)    CHF (congestive heart failure) (HCC)    Chronic lower back pain    Chronic systolic CHF (congestive heart failure) ([place])    Echo 01/17/16 - EF 35-40%, diffuse HK, mild LAE    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[place]) 02/03/2016   a. EF 35-40% in setting of AFlutter with RVR in 5/17 // probably tachycardia mediated // needs FU echo 2-3 mos after NSR restored // b. Limited Echo 7/17: EF 45-50%, diff HK, mild LAE   Depression    "last 2-3 months; never treated  for it" (12/09/2016)   Dysrhythmia    A. Fib   Fatty liver 12/2015   per CT   Gastroenteritis 06/05/2017   GERD (gastroesophageal reflux disease)    Headache    "monthly" (12/09/2016)   Obesity    BMI 45 12/2015   Pancreatic pseudocyst    Pancreatitis 2012   recurrent acute. Admissions to [facility], 12/2015 admissin to [facility], ct then shows pseudocyst.    Pancreatitis 2012 X 2; 2017 X 2   Pancreatitis 03/05/2016   Paroxysmal atrial fibrillation ([place])    Typical atrial flutter ([place]) 06/06/2017   Past Surgical History:  Procedure Laterality Date   ATRIAL FIBRILLATION ABLATION N/A 12/09/2016   Procedure: Atrial Fibrillation Ablation;  Surgeon: Thompson Grayer, MD;  Location: [facility];  Service: Cardiovascular;  Laterality: N/A;   ATRIAL FIBRILLATION ABLATION N/A 11/09/2019   Procedure: ATRIAL FIBRILLATION ABLATION;  Surgeon: Thompson Grayer, MD;  Location: [facility];  Service: Cardiovascular;  Laterality: N/A;   CHOLECYSTECTOMY  2022   CLIPPING OF ATRIAL APPENDAGE N/A 10/18/2022   Procedure: CLIPPING OF LEFT ATRIAL APPENDAGE;  Surgeon: Neomia Glass, MD;  Location: [facility];  Service: Open Heart Surgery;  Laterality: N/A;   HAND SURGERY Right ~ 2008   "had pins in it; pulled them"   TEE WITHOUT CARDIOVERSION N/A 12/08/2016   Procedure: TRANSESOPHAGEAL ECHOCARDIOGRAM (TEE);  Surgeon: Jerline Pain, MD;  Location: [facility];  Service: Cardiovascular;  Laterality: N/A;   TEE WITHOUT CARDIOVERSION N/A 10/18/2022   Procedure: TRANSESOPHAGEAL ECHOCARDIOGRAM;  Surgeon: Neomia Glass, MD;  Location: [facility];  Service: Open Heart Surgery;  Laterality: N/A;   TONSILLECTOMY AND ADENOIDECTOMY     "in my teens"    Current Outpatient Medications  Medication Sig Dispense Refill   acetaminophen (TYLENOL) 650 MG CR tablet Take 650 mg by mouth every 8 (eight) hours as needed for pain.     amoxicillin (AMOXIL) 875 MG tablet Take 875 mg by mouth 2 (two) times daily.     clonazePAM  (KLONOPIN) 0.5 MG tablet Take 0.5 tablets (0.25 mg total) by mouth 2 (two) times daily. 30 tablet 0   colchicine 0.6 MG tablet Take 1 tablet (0.6 mg total) by mouth daily for 7 days. 7 tablet 0   dofetilide (TIKOSYN) 500 MCG capsule Take 1 capsule (500 mcg total) by mouth 2 (two) times daily. 30 capsule 2   ELIQUIS 5 MG TABS tablet TAKE 1 TABLET BY MOUTH TWICE DAILY 60 tablet 5   methylPREDNISolone (MEDROL DOSEPAK) 4 MG TBPK tablet Use as directed on pack. 1 each 0   metoprolol succinate (TOPROL-XL) 50 MG 24 hr tablet Take 1 tablet (50 mg total) by mouth daily at 8 pm. Take with or immediately following a meal. 30 tablet 2   Multiple Vitamin (ONE-A-DAY MENS PO) Take 1 tablet by mouth  every morning.     nicotine (NICODERM CQ - DOSED IN MG/24 HOURS) 21 mg/24hr patch Place 1 patch (21 mg total) onto the skin daily. (Patient taking differently: Place 21 mg onto the skin daily as needed (Stop smoking).) 28 patch 0   oxyCODONE (OXY IR/ROXICODONE) 5 MG immediate release tablet Take 1 tablet (5 mg total) by mouth every 6 (six) hours as needed for up to 7 days for moderate pain. 28 tablet 0   pantoprazole (PROTONIX) 40 MG tablet TAKE 1 TABLET(40 MG) BY MOUTH DAILY 90 tablet 3   polyethylene glycol (MIRALAX / GLYCOLAX) 17 g packet Take 17 g by mouth daily for 7 days.  0   triamcinolone cream (KENALOG) 0.1 % Apply 1 application topically daily as needed (for irritation).      sertraline (ZOLOFT) 25 MG tablet Take 1.5 tablets (37.5 mg total) by mouth daily. TAKE 1 AND 1/2 TABLETS BY MOUTH EVERY DAY (Patient taking differently: Take 25 mg by mouth daily.) 45 tablet 0   No current facility-administered medications for this encounter.    Allergies  Allergen Reactions   Morphine Nausea And Vomiting    Social History   Socioeconomic History   Marital status: Married    Spouse name: Not on file   Number of children: 0   Years of education: 12   Highest education level: Not on file  Occupational History     Employer: [employer]  Tobacco Use   Smoking status: Former    Packs/day: 0.50    Types: Cigarettes    Quit date: 2016    Years since quitting: 8.1   Smokeless tobacco: Current    Types: Snuff   Tobacco comments:    Nicotine patches sent 07/12/22  Vaping Use   Vaping Use: Never used  Substance and Sexual Activity   Alcohol use: Not Currently    Comment: last drink 2018   Drug use: Not Currently    Types: Marijuana    Comment: 12/09/2016 "daily from age 58 to 20"   Sexual activity: Yes    Birth control/protection: None  Other Topics Concern   Not on file  Social History Narrative   Pt lives in [place] with fiancee.  Works at [employer] in [place].   Social Determinants of Radio broadcast assistant Strain: Not on file  Food Insecurity: Not on file  Transportation Needs: Not on file  Physical Activity: Not on file  Stress: Not on file  Social Connections: Not on file  Intimate Partner Violence: Not on file    Family History  Problem Relation Age of Onset   Diabetes Mother    Gallbladder disease Mother    Hypertension Father    Supraventricular tachycardia Brother    Cancer Maternal Grandmother    Cancer Maternal Grandfather    Cirrhosis Maternal Grandfather    Stroke Paternal Grandmother    Celiac disease Neg Hx    Breast cancer Neg Hx    Cholelithiasis Neg Hx    Colon cancer Neg Hx    Colon polyps Neg Hx    Crohn's disease Neg Hx    Esophageal cancer Neg Hx    Hepatitis Neg Hx    Inflammatory bowel disease Neg Hx    Liver disease Neg Hx    Pancreatitis Neg Hx    Stomach cancer Neg Hx    Thyroid disease Neg Hx    Ulcerative colitis Neg Hx     ROS- All systems are reviewed and negative except as  per the HPI above  Physical Exam: Vitals:   10/28/22 1353  BP: 114/74  Pulse: 83  Weight: (!) 141.4 kg  Height: 6' (1.829 m)     Wt Readings from Last 3 Encounters:  10/28/22 (!) 141.4 kg  10/21/22 (!) 142.7 kg  10/14/22 (!) 144.7 kg     Labs: Lab Results  Component Value Date   NA 140 10/21/2022   K 3.7 10/21/2022   CL 103 10/21/2022   CO2 27 10/21/2022   GLUCOSE 96 10/21/2022   BUN 12 10/21/2022   CREATININE 0.77 10/21/2022   CALCIUM 8.0 (L) 10/21/2022   PHOS 3.8 05/09/2018   MG 1.9 10/21/2022   Lab Results  Component Value Date   INR 1.2 10/14/2022   Lab Results  Component Value Date   CHOL 116 05/11/2018   HDL 31 (L) 05/11/2018   LDLCALC 73 05/11/2018   TRIG 61 05/11/2018    GEN- The patient is a well appearing obese male, alert and oriented x 3 today.   HEENT-head normocephalic, atraumatic, sclera clear, conjunctiva pink, hearing intact, trachea midline. Lungs- Clear to ausculation bilaterally, normal work of breathing Heart- Regular rate and rhythm, no murmurs, rubs or gallops  GI- soft, NT, ND, + BS Extremities- no clubbing, cyanosis, or edema MS- no significant deformity or atrophy Skin- no rash or lesion Psych- euthymic mood, full affect Neuro- strength and sensation are intact   EKG-  SR Vent. rate 83 BPM PR interval 144 ms QRS duration 84 ms QT/QTcB 394/462 ms   CHA2DS2-VASc Score = 2  The patient's score is based upon: CHF History: 1 HTN History: 1 Diabetes History: 0 Stroke History: 0 Vascular Disease History: 0 Age Score: 0 Gender Score: 0       ASSESSMENT AND PLAN: 1. Persistent Atrial Fibrillation/atrial flutter The patient's CHA2DS2-VASc score is 2, indicating a 2.2% annual risk of stroke.   S/p ablation in 2018 and redo 2021 S/p convergent 10/18/22 with Dr Tenny Craw.  S/p dofetilide reloading 09/2022 Patient appears to be maintaining SR with minimal symptoms.  Continue Toprol 25 mg daily Continue dofetilide 500 mcg BID. QT stable. Continue Eliquis 5 mg BID Check bmet/mag today.  2. Secondary Hypercoagulable State (ICD10:  D68.69) The patient is at significant risk for stroke/thromboembolism based upon his CHA2DS2-VASc Score of 2.  Continue Apixaban (Eliquis).    3. HTN Stable, no changes today.  4. Obesity Body mass index is 42.29 kg/m. Lifestyle modification was discussed and encouraged including regular physical activity and weight reduction.    Follow up with Dr Tenny Craw and Tommye Standard as scheduled.    [facility] [address] [place], [place] [address] [phone]

## 2022-10-29 ENCOUNTER — Other Ambulatory Visit (HOSPITAL_COMMUNITY): Payer: Self-pay | Admitting: *Deleted

## 2022-10-29 ENCOUNTER — Ambulatory Visit (HOSPITAL_COMMUNITY): Payer: 59 | Admitting: Physician Assistant

## 2022-10-29 DIAGNOSIS — I4819 Other persistent atrial fibrillation: Secondary | ICD-10-CM

## 2022-10-29 MED ORDER — POTASSIUM CHLORIDE ER 10 MEQ PO TBCR: 10.0000 meq | EXTENDED_RELEASE_TABLET | Freq: Every day | ORAL | 3 refills | Status: DC

## 2022-10-31 NOTE — Op Note (Signed)
OPERATIVE NOTE: Patient Name: Nathaniel [name] Date of Birth: 11-Aug-1991 Date of Operation: 10/18/22  PRE-OPERATIVE DIAGNOSIS: Atrial fibrillation, longstanding Obesity Anxiety related to medical conditions History of pancreatitis Pancreast divisum  POST-OPERATIVE DIAGNOSIS: Same  OPERATION: Convergent epicardial ablation, extensive Clipping of left atrial appendage   SURGEON: Pierre Bali Classie Weng MD   ASSISTANT: Enid Cutter, PA   EBL 10cc   FINDINGS: Large area on back wall of left atrium ablated.    Approximately 25 epicardial ablations performed   Left atrial appendage clipped without issue, obliterated on TEE   SPECIMENS None   COMPLICATIONS: None   TUBES:  1 JP to Bulb (mediastinal) 1 24 Fr chest tube (left pleural)   HISTORY: 32M with persistent AF s/p 2 catheter ablations. Feels sig worse in AF.    Most sig other medical issue is hx of pancreatitis -  Pancreast divisum. [place] panceas.  Can't get to cyst  Gallbladder out in June lst year. Only 1 pancreatitis since then.  Treated with fluids and pain  meds and 2-3 day stay in the past. No stay more than 4 days, not intubation.    I told him we are getting started with Convergent here and he would be on of the initial patients. I offered him procedure to be later if our experience here if that's what he prefers. It seems that he wants to think about and likely move forward in March. AF is essentially disabling for him, inter-related with anxiety, and making it hard to move forward in life.   Typical workup includes: Coronary eval for patients > 85 yo. - N/A as young patient Echo in the last year - completed 08/11/22     We broadly discussed the rationale for the convergent approach.  Standard recovery is walking around day after procedure, discharge to home postop day 2.  I see convergent (epicardial ablation) patients postoperatively back in the clinic in 1 week and 1 month. Patients generally  have a full recovery by 3 weeks.    Risks/benefits/alternatives discussed at length. All questions were asked an answered. Risks are <<1% mortality, 2% morbidity (bleeding, infection, damage to surrounding structures, pancreatic issues and prolonged stay given hx recurrent pancreatitis) and >97% standard recovery.   Typically I do these procedures on Mondays, last dose of Eliquis is Saturday morning. I restart AC the night of surgery. If they receive an LAA clip, most patients, but not all, come off their AC 2-6 months after surgery at the discretion of their referring cardiologist. Many ultimately have reductions in the degree of antiarrythmic medications as well. Expected outcome is significantly more freedom from Afib (measured by 90% reduction of time in AF).   PROCEDURE IN DETAIL: The patient was brought in the operating room and laid in supine position. A TEE probe was placed and it was confirmed there was not clot in the left atrial appendage. Following this, a twelve point temperature probe was placed and fluoroscopically found to be in adequate position. There were no red-level temperature warnings during the case.     The patient was prepped and draped in standard fashion.   An arterial and two large peripheral IVs were placed by anesthesia along with a double-lumen endotracheal tube. An inflatable bump placed under left side and patient was prepped with bump in place. Bump deflated. After timeout, incision made over xiphoid approximately 7cm. Local analgesia given. This was dissected down to xiphoid with electrocautery and xiphoid removed. Pericardiotomy performed. Atricure cannula inserted along with 50m  zero degree scope. Approximately 25 epicardial ablations were performed along the back wall of the left atrium. There were done in 3 rows and with a fairly high pericardial reflection we achieved ablation on some of the left atrium roof as well (more than usual).  Following this, I withdrew  the probe and placed a JP drain attached to a bulb and closed this incision in 3 layers of absorbable suture.   Next, attention was turned to management of the left atrial appendage.   I then inflated the left bump on the patient and placed local analgesia around the 2nd interspace along the mid-clavicular line. I then placed a 73m port there after the left lung was deflated and the lung placed on suction. A 5106m30 degree camera was placed into the 2nd port and I viewed the pleural space. Following this, I placed local analgesia in the interspaces and skin for a 6th and 4th interspace port. Then, I placed a 65m2mort in the 4th space after a 35m465mrt was placed in the 6-7th space. Next, Ligasure was used to make a pericardial window staying well away from the phrenic nerve at least 1cm. There was an enormous amount of pericardial fat and we stayed away from the phrenic nerve. Next, I dissected out the left atrial appendage. I placed an atriclip on the LAA and no residual LAA present in this patient. I then placed a 24 Fr chest tube and closed all incisions with 2 layers of absorbable sutures and dermabond.     I cardioverted the patient externally at 200J to sinus rhythm. The patient was extubated in the OR and in stable condition.

## 2022-11-01 ENCOUNTER — Ambulatory Visit (INDEPENDENT_AMBULATORY_CARE_PROVIDER_SITE_OTHER): Payer: 59 | Admitting: Clinical

## 2022-11-01 DIAGNOSIS — F419 Anxiety disorder, unspecified: Secondary | ICD-10-CM

## 2022-11-01 DIAGNOSIS — F331 Major depressive disorder, recurrent, moderate: Secondary | ICD-10-CM | POA: Diagnosis not present

## 2022-11-01 NOTE — Progress Notes (Signed)
Virtual Visit via Video Note   I connected with Nathaniel [name] on 11/01/22 at  2:00 PM EST by a video enabled telemedicine application and verified that I am speaking with the correct person using two identifiers.   Location: Patient: Home Provider: Office   I discussed the limitations of evaluation and management by telemedicine and the availability of in person appointments. The patient expressed understanding and agreed to proceed.     THERAPIST PROGRESS NOTE   Session Time: 2:00 PM- 2:30 PM   Participation Level: Active   Behavioral Response: CasualAlertDepressed   Type of Therapy: Individual Therapy   Treatment Goals addressed: Coping   Interventions: CBT, Motivational Interviewing, Strength-based and Supportive   Summary: Nathaniel [name] is a 32 y.o. male who presents with Depression with Anxiety. The OPT therapist worked with the patient for his OPT treatment .The OPT therapist utilized Motivational Interviewing to assist in creating therapeutic repore. The OPT therapist gained feedback about the patients triggers and symptoms over the past few week.The patient spoke about impact of recent surgery, recovery, and noticeable beneficial changes in health and mental health post surgery.The OPT therapist utilized Cognitive Behavioral Therapy through cognitive restructuring as well as worked on coping strategies to assist in management of his mental health symptoms. The OPT therapist continued to place emphasis on the patient and challenging ANTS (automatic negative thoughts) to improve his mental health. The OPT therapist worked with the patient on balancing stress/ with coping. The patient worked with the [place] therapist on keeping good boundaries in the different sections of his life and working on his reactive behavior.   Suicidal/Homicidal: Nowithout intent/plan   Therapist Response: The OPT therapist worked with the patient for the patients scheduled session. The patient was  engaged in his session and gave feedback in relation to triggers, symptoms, and behavior responses over the past few weeks. The OPT therapist worked utilizing an in Warden/ranger Therapy exercise. The patient noted, " The recovery has been tough for the first few weeks, recovery has been pretty fast after the first week and I am still healing on light duty until this Thursday". The patient spoke about being mindful of work life balance and doing better at working on different sections of his life. The The OPT therapist worked with the patient to be mindful of self care and reviewed with the patient basic health areas including sleep cycle, eating habits,  physical exercise, and hygiene. The OPT therapist continued to review stress/ coping balance and management of different sections of his life. The patient is still awaiting a approval from Disability that he filed for 2 years ago. The OPT therapist will continue treatment work with the patient in his next session.      Plan: Follow up in 2/3 weeks   Diagnosis:      Axis I: Recurrent, Moderate Depressive Disorder with Anxiety                        Axis II: No diagnosis   Collaboration of Care: No additional collaboration for this session.   Patient/Guardian was advised Release of Information must be obtained prior to any record release in order to collaborate their care with an outside provider. Patient/Guardian was advised if they have not already done so to contact the registration department to sign all necessary forms in order for [place] to release information regarding their care.    Consent: Patient/Guardian gives verbal consent for treatment and assignment of  benefits for services provided during this visit. Patient/Guardian expressed understanding and agreed to proceed.    I discussed the assessment and treatment plan with the patient. The patient was provided an opportunity to ask questions and all were answered. The patient agreed  with the plan and demonstrated an understanding of the instructions.   The patient was advised to call back or seek an in-person evaluation if the symptoms worsen or if the condition fails to improve as anticipated.   I provided 30 minutes of non-face-to-face time during this encounter.     [name], LCSW   11/01/2022

## 2022-11-16 ENCOUNTER — Encounter (HOSPITAL_COMMUNITY)
Admission: RE | Admit: 2022-11-16 | Discharge: 2022-11-16 | Disposition: A | Discharge status: Home / Self Care | Payer: 59 | Source: Ambulatory Visit | Attending: Cardiothoracic Surgery | Admitting: Cardiothoracic Surgery

## 2022-11-16 VITALS — BP 116/70 | HR 58 | Ht 72.0 in | Wt 310.8 lb

## 2022-11-16 DIAGNOSIS — Z5189 Encounter for other specified aftercare: Secondary | ICD-10-CM | POA: Insufficient documentation

## 2022-11-16 DIAGNOSIS — I4819 Other persistent atrial fibrillation: Secondary | ICD-10-CM | POA: Insufficient documentation

## 2022-11-16 NOTE — Progress Notes (Signed)
Cardiac Individual Treatment Plan  Patient Details  Name: Nathaniel [name] MRN: [id number] Date of Birth: [birth year] Referring Provider:   Flowsheet Row CARDIAC REHAB PHASE II ORIENTATION from 11/16/2022 in [place]  Referring Provider Dr. Tenny Craw       Initial Encounter Date:  Flowsheet Row CARDIAC REHAB PHASE II ORIENTATION from 11/16/2022 in [place]  Date 11/16/22       Visit Diagnosis: Atrial fibrillation, persistent ([place])  Patient's Home Medications on Admission:  Current Outpatient Medications:    acetaminophen (TYLENOL) 650 MG CR tablet, Take 650 mg by mouth every 8 (eight) hours as needed for pain., Disp: , Rfl:    amoxicillin (AMOXIL) 875 MG tablet, Take 875 mg by mouth 2 (two) times daily., Disp: , Rfl:    clonazePAM (KLONOPIN) 0.5 MG tablet, Take 0.5 tablets (0.25 mg total) by mouth 2 (two) times daily., Disp: 30 tablet, Rfl: 0   colchicine 0.6 MG tablet, Take 1 tablet (0.6 mg total) by mouth daily for 7 days., Disp: 7 tablet, Rfl: 0   dofetilide (TIKOSYN) 500 MCG capsule, Take 1 capsule (500 mcg total) by mouth 2 (two) times daily., Disp: 30 capsule, Rfl: 2   ELIQUIS 5 MG TABS tablet, TAKE 1 TABLET BY MOUTH TWICE DAILY, Disp: 60 tablet, Rfl: 5   methylPREDNISolone (MEDROL DOSEPAK) 4 MG TBPK tablet, Use as directed on pack., Disp: 1 each, Rfl: 0   metoprolol succinate (TOPROL-XL) 50 MG 24 hr tablet, Take 1 tablet (50 mg total) by mouth daily at 8 pm. Take with or immediately following a meal., Disp: 30 tablet, Rfl: 2   Multiple Vitamin (ONE-A-DAY MENS PO), Take 1 tablet by mouth every morning., Disp: , Rfl:    nicotine (NICODERM CQ - DOSED IN MG/24 HOURS) 21 mg/24hr patch, Place 1 patch (21 mg total) onto the skin daily. (Patient taking differently: Place 21 mg onto the skin daily as needed (Stop smoking).), Disp: 28 patch, Rfl: 0   pantoprazole (PROTONIX) 40 MG tablet, TAKE 1 TABLET(40 MG) BY MOUTH DAILY, Disp: 90 tablet,  Rfl: 3   potassium chloride (KLOR-CON) 10 MEQ tablet, Take 1 tablet (10 mEq total) by mouth daily., Disp: 90 tablet, Rfl: 3   sertraline (ZOLOFT) 25 MG tablet, Take 1.5 tablets (37.5 mg total) by mouth daily. TAKE 1 AND 1/2 TABLETS BY MOUTH EVERY DAY (Patient taking differently: Take 25 mg by mouth daily.), Disp: 45 tablet, Rfl: 0   triamcinolone cream (KENALOG) 0.1 %, Apply 1 application topically daily as needed (for irritation). , Disp: , Rfl:   Past Medical History: Past Medical History:  Diagnosis Date   Acute pancreatitis without infection or necrosis 03/05/2016   Anginal pain (HCC)    Happens around moments of A.Fib   Anxiety    Arthritis    Back   Atrial fibrillation (HCC)    Atrial flutter ([place]) 01/17/2016   Admx 5/17 with AFlutter with RVR in setting of pancreatitis // CHADS2-VASc=1 (CHF) // Anticoag not started // Amiodarone >> NSR    Cardiomyopathy (HCC)    CHF (congestive heart failure) (HCC)    Chronic lower back pain    Chronic systolic CHF (congestive heart failure) ([place])    Echo 01/17/16 - EF 35-40%, diffuse HK, mild LAE    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[place]) 02/03/2016   a. EF 35-40% in setting of AFlutter with RVR in 5/17 // probably tachycardia mediated // needs FU echo 2-3 mos after NSR restored // b. Limited Echo 7/17:  EF 45-50%, diff HK, mild LAE   Depression    "last 2-3 months; never treated for it" (12/09/2016)   Dysrhythmia    A. Fib   Fatty liver 12/2015   per CT   Gastroenteritis 06/05/2017   GERD (gastroesophageal reflux disease)    Headache    "monthly" (12/09/2016)   Obesity    BMI 45 12/2015   Pancreatic pseudocyst    Pancreatitis 2012   recurrent acute. Admissions to [facility], 12/2015 admissin to [facility] hospital, ct then shows pseudocyst.    Pancreatitis 2012 X 2; 2017 X 2   Pancreatitis 03/05/2016   Paroxysmal atrial fibrillation (HCC)    Typical atrial flutter ([place]) 06/06/2017    Tobacco Use: Social History   Tobacco Use  Smoking Status Former    Packs/day: .5   Types: Cigarettes   Quit date: 2016   Years since quitting: 8.2  Smokeless Tobacco Current   Types: Snuff  Tobacco Comments   Nicotine patches sent 07/12/22    Labs: Review Flowsheet  More data exists      Latest Ref Rng & Units 01/23/2016 03/05/2016 05/11/2018 10/14/2022 10/18/2022  Labs for ITP Cardiac and Pulmonary Rehab  Cholestrol 0 - 200 mg/dL - 110  116  - -  LDL (calc) 0 - 99 mg/dL - 63  73  - -  HDL-C >40 mg/dL - 30  31  - -  Trlycerides <150 mg/dL 100  85  61  - -  PH, Arterial 7.35 - 7.45 - - - 7.47  7.325   PCO2 arterial 32 - 48 mmHg - - - 36  46.2   Bicarbonate 20.0 - 28.0 mmol/L - - - 26.2  24.1   TCO2 22 - 32 mmol/L - - - - 25  26   Acid-base deficit 0.0 - 2.0 mmol/L - - - - 2.0   O2 Saturation % - - - 99.4  100     Capillary Blood Glucose: No results found for: "GLUCAP"   Exercise Target Goals: Exercise Program Goal: Individual exercise prescription set using results from initial 6 min walk test and THRR while considering  patient's activity barriers and safety.   Exercise Prescription Goal: Starting with aerobic activity 30 plus minutes a day, 3 days per week for initial exercise prescription. Provide home exercise prescription and guidelines that participant acknowledges understanding prior to discharge.  Activity Barriers & Risk Stratification:  Activity Barriers & Cardiac Risk Stratification - 11/16/22 1312       Activity Barriers & Cardiac Risk Stratification   Activity Barriers Arthritis;Back Problems;Deconditioning;Muscular Weakness;Decreased Ventricular Function             6 Minute Walk:  6 Minute Walk     Row Name 11/16/22 1431         6 Minute Walk   Phase Initial     Distance 1500 feet     Walk Time 6 minutes     # of Rest Breaks 0     MPH 2.84     METS 4.25     RPE 11     VO2 Peak 14.88     Symptoms Yes (comment)     Comments lower back pain 5/10     Resting HR 61 bpm     Resting BP 116/70     Resting  Oxygen Saturation  96 %     Exercise Oxygen Saturation  during 6 min walk 98 %     Max Ex. HR 82 bpm  Max Ex. BP 132/72     2 Minute Post BP 118/70              Oxygen Initial Assessment:   Oxygen Re-Evaluation:   Oxygen Discharge (Final Oxygen Re-Evaluation):   Initial Exercise Prescription:  Initial Exercise Prescription - 11/16/22 1400       Date of Initial Exercise RX and Referring Provider   Date 11/16/22    Referring Provider Dr. Tenny Craw    Expected Discharge Date 02/09/23      Treadmill   MPH 2    Grade 0    Minutes 17      Recumbant Elliptical   Level 1    RPM 45    Minutes 22      Prescription Details   Frequency (times per week) 3    Duration Progress to 30 minutes of continuous aerobic without signs/symptoms of physical distress      Intensity   THRR 40-80% of Max Heartrate 75-150    Ratings of Perceived Exertion 11-13      Resistance Training   Training Prescription Yes    Weight 4    Reps 10-15             Perform Capillary Blood Glucose checks as needed.  Exercise Prescription Changes:   Exercise Comments:   Exercise Goals and Review:   Exercise Goals     Row Name 11/16/22 1433             Exercise Goals   Increase Physical Activity Yes       Intervention Develop an individualized exercise prescription for aerobic and resistive training based on initial evaluation findings, risk stratification, comorbidities and participant's personal goals.;Provide advice, education, support and counseling about physical activity/exercise needs.       Expected Outcomes Short Term: Attend rehab on a regular basis to increase amount of physical activity.;Long Term: Add in home exercise to make exercise part of routine and to increase amount of physical activity.;Long Term: Exercising regularly at least 3-5 days a week.       Increase Strength and Stamina Yes       Intervention Provide advice, education, support and counseling about  physical activity/exercise needs.;Develop an individualized exercise prescription for aerobic and resistive training based on initial evaluation findings, risk stratification, comorbidities and participant's personal goals.       Expected Outcomes Short Term: Increase workloads from initial exercise prescription for resistance, speed, and METs.;Short Term: Perform resistance training exercises routinely during rehab and add in resistance training at home;Long Term: Improve cardiorespiratory fitness, muscular endurance and strength as measured by increased METs and functional capacity (6MWT)       Able to understand and use rate of perceived exertion (RPE) scale Yes       Intervention Provide education and explanation on how to use RPE scale       Expected Outcomes Short Term: Able to use RPE daily in rehab to express subjective intensity level;Long Term:  Able to use RPE to guide intensity level when exercising independently       Knowledge and understanding of Target Heart Rate Range (THRR) Yes       Intervention Provide education and explanation of THRR including how the numbers were predicted and where they are located for reference       Expected Outcomes Short Term: Able to state/look up THRR;Short Term: Able to use daily as guideline for intensity in rehab;Long Term: Able to use THRR to govern intensity  when exercising independently       Able to check pulse independently Yes       Intervention Provide education and demonstration on how to check pulse in carotid and radial arteries.;Review the importance of being able to check your own pulse for safety during independent exercise       Expected Outcomes Short Term: Able to explain why pulse checking is important during independent exercise;Long Term: Able to check pulse independently and accurately       Understanding of Exercise Prescription Yes       Intervention Provide education, explanation, and written materials on patient's individual  exercise prescription       Expected Outcomes Short Term: Able to explain program exercise prescription;Long Term: Able to explain home exercise prescription to exercise independently                Exercise Goals Re-Evaluation :    Discharge Exercise Prescription (Final Exercise Prescription Changes):   Nutrition:  Target Goals: Understanding of nutrition guidelines, daily intake of sodium 1500mg , cholesterol 200mg , calories 30% from fat and 7% or less from saturated fats, daily to have 5 or more servings of fruits and vegetables.  Biometrics:  Pre Biometrics - 11/16/22 1434       Pre Biometrics   Height 6' (1.829 m)    Weight 310 lb 13.6 oz (141 kg)    Waist Circumference 52 inches    Hip Circumference 50 inches    Waist to Hip Ratio 1.04 %    BMI (Calculated) 42.15    Triceps Skinfold 52 mm    % Body Fat 42.1 %    Grip Strength 44.5 kg    Flexibility 0 in    Single Leg Stand 25.56 seconds              Nutrition Therapy Plan and Nutrition Goals:  Nutrition Therapy & Goals - 11/16/22 1318       Personal Nutrition Goals   Comments He does follow a heart healthy diet and tries to avoid fried foods. He scored a 24 on his MEDFICTs.      Intervention Plan   Intervention Nutrition handout(s) given to patient.    Expected Outcomes Short Term Goal: Understand basic principles of dietary content, such as calories, fat, sodium, cholesterol and nutrients.             Nutrition Assessments:  Nutrition Assessments - 11/16/22 1253       MEDFICTS Scores   Pre Score 30            MEDIFICTS Score Key: ?70 Need to make dietary changes  40-70 Heart Healthy Diet ? 40 Therapeutic Level Cholesterol Diet   Picture Your Plate Scores: [id number] Unhealthy dietary pattern with much room for improvement. 41-50 Dietary pattern unlikely to meet recommendations for good health and room for improvement. 51-60 More healthful dietary pattern, with some room for  improvement.  >60 Healthy dietary pattern, although there may be some specific behaviors that could be improved.    Nutrition Goals Re-Evaluation:   Nutrition Goals Discharge (Final Nutrition Goals Re-Evaluation):   Psychosocial: Target Goals: Acknowledge presence or absence of significant depression and/or stress, maximize coping skills, provide positive support system. Participant is able to verbalize types and ability to use techniques and skills needed for reducing stress and depression.  Initial Review & Psychosocial Screening:  Initial Psych Review & Screening - 11/16/22 1354       Initial Review   Current issues with  Current Depression;Current Anxiety/Panic;Current Sleep Concerns;Current Stress Concerns;Current Psychotropic Meds    Source of Stress Concerns Chronic Illness;Financial;Retirement/disability    Comments He had been in a fib for the past three years and unable to work. He also battles with panic attacks. His disability has been pending for 3 years now.      Family Dynamics   Good Support System? Yes    Comments His wife, parents, and in laws are his support system.      Barriers   Psychosocial barriers to participate in program The patient should benefit from training in stress management and relaxation.;There are no identifiable barriers or psychosocial needs.      Screening Interventions   Interventions Encouraged to exercise;Provide feedback about the scores to participant    Expected Outcomes Long Term goal: The participant improves quality of Life and PHQ9 Scores as seen by post scores and/or verbalization of changes;Short Term goal: Identification and review with participant of any Quality of Life or Depression concerns found by scoring the questionnaire.             Quality of Life Scores:  Quality of Life - 11/16/22 1434       Quality of Life   Select Quality of Life      Quality of Life Scores   Health/Function Pre 16.8 %    Socioeconomic  Pre 18.57 %    Psych/Spiritual Pre 20.57 %    Family Pre 27 %    GLOBAL Pre 19.21 %            Scores of 19 and below usually indicate a poorer quality of life in these areas.  A difference of  2-3 points is a clinically meaningful difference.  A difference of 2-3 points in the total score of the Quality of Life Index has been associated with significant improvement in overall quality of life, self-image, physical symptoms, and general health in studies assessing change in quality of life.  PHQ-9: Review Flowsheet       11/16/2022 08/12/2022 06/22/2022  Depression screen PHQ 2/9  Decreased Interest 1 3 1   Down, Depressed, Hopeless 1 0 1  PHQ - 2 Score 2 3 2   Altered sleeping 1 3 3   Tired, decreased energy 2 3 3   Change in appetite 1 0 2  Feeling bad or failure about yourself  1 2 2   Trouble concentrating 2 3 1   Moving slowly or fidgety/restless 2 0 3  Suicidal thoughts 0 0 0  PHQ-9 Score 11 14 16   Difficult doing work/chores Somewhat difficult Somewhat difficult Very difficult   Interpretation of Total Score  Total Score Depression Severity:  1-4 = Minimal depression, 5-9 = Mild depression, 10-14 = Moderate depression, 15-19 = Moderately severe depression, 20-27 = Severe depression   Psychosocial Evaluation and Intervention:  Psychosocial Evaluation - 11/16/22 1341       Psychosocial Evaluation & Interventions   Interventions Stress management education;Relaxation education;Encouraged to exercise with the program and follow exercise prescription    Comments Pt has no barriers to participate in CR. He reports that before his convergent ablation that he has been in atrial fibrillation for the past 3 years. He states that he has felt tired constantly and has been unable to work due to this. He does report issues with sleep, depression, anxiety, and stress; all of these have some relation to his atrial fibrillation. He states that his sleep has gotten much better over the past  month since he as been  out of atrial fibrillation. 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 His anxiety and depression are treated with Klonopin and Zoloft. He was recently put on Zoloft so that he can eventually be weaned off of benzodiazepines. He also routinely sees a Social worker. He reports that his anxiety and depression is well managed and that it is getting better since he is no longer in atrial fibrillation. His stress stems from his atrial fibrillation, lack of occupation, and disability status. He formerly worked at the [employer], but he has been unable to do so for the past 3 years. He has been fighting for disability for the past three years, and was previously denied twice, but this time a judge has approved it. He is hopeful that it will finally be approved by the government now. He used to use smokeless tobacco (dip) and is working towards cessation. He has been using nicotine salt pouches to aid his cessation, and he hopes that he will be able to quit doing these as well. He reports that he has felt better over the past month than he has in years. He reports that he has a good support system with his wife, parents, and in laws. His goals in the program are to lose weight and to return to the activities that a normal 32 year old can do. He is highly motivated and eager to do the program. His insurance out of pocket and deductible will start over in May, so he may only be able to do the program through April, but he is motivated to get as much out of this program as he can.    Expected Outcomes Pt's anxiety/depression will continue to be managed with Zoloft, Klonopin, and counseling. His stress and sleep concerns will continue to decrease as he stays out of atrial fibrillation.    Continue Psychosocial Services  Follow up required by staff             Psychosocial Re-Evaluation:   Psychosocial Discharge (Final Psychosocial Re-Evaluation):   Vocational  Rehabilitation: Provide vocational rehab assistance to qualifying candidates.   Vocational Rehab Evaluation & Intervention:  Vocational Rehab - 11/16/22 1323       Initial Vocational Rehab Evaluation & Intervention   Assessment shows need for Vocational Rehabilitation No      Vocational Rehab Re-Evaulation   Comments He has applied for disability and it is pending.             Education: Education Goals: Education classes will be provided on a weekly basis, covering required topics. Participant will state understanding/return demonstration of topics presented.  Learning Barriers/Preferences:  Learning Barriers/Preferences - 11/16/22 1253       Learning Barriers/Preferences   Learning Barriers None    Learning Preferences Skilled Demonstration             Education Topics: Hypertension, Hypertension Reduction -Define heart disease and high blood pressure. Discus how high blood pressure affects the body and ways to reduce high blood pressure.   Exercise and Your Heart -Discuss why it is important to exercise, the FITT principles of exercise, normal and abnormal responses to exercise, and how to exercise safely.   Angina -Discuss definition of angina, causes of angina, treatment of angina, and how to decrease risk of having angina.   Cardiac Medications -Review what the following cardiac medications are used for, how they affect the body, and side effects that may occur when taking the medications.  Medications include  Aspirin, Beta blockers, calcium channel blockers, ACE Inhibitors, angiotensin receptor blockers, diuretics, digoxin, and antihyperlipidemics.   Congestive Heart Failure -Discuss the definition of CHF, how to live with CHF, the signs and symptoms of CHF, and how keep track of weight and sodium intake.   Heart Disease and Intimacy -Discus the effect sexual activity has on the heart, how changes occur during intimacy as we age, and safety during  sexual activity.   Smoking Cessation / COPD -Discuss different methods to quit smoking, the health benefits of quitting smoking, and the definition of COPD.   Nutrition I: Fats -Discuss the types of cholesterol, what cholesterol does to the heart, and how cholesterol levels can be controlled.   Nutrition II: Labels -Discuss the different components of food labels and how to read food label   Heart Parts/Heart Disease and PAD -Discuss the anatomy of the heart, the pathway of blood circulation through the heart, and these are affected by heart disease.   Stress I: Signs and Symptoms -Discuss the causes of stress, how stress may lead to anxiety and depression, and ways to limit stress.   Stress II: Relaxation -Discuss different types of relaxation techniques to limit stress.   Warning Signs of Stroke / TIA -Discuss definition of a stroke, what the signs and symptoms are of a stroke, and how to identify when someone is having stroke.   Knowledge Questionnaire Score:  Knowledge Questionnaire Score - 11/16/22 1253       Knowledge Questionnaire Score   Pre Score 25/28             Core Components/Risk Factors/Patient Goals at Admission:  Personal Goals and Risk Factors at Admission - 11/16/22 1412       Core Components/Risk Factors/Patient Goals on Admission    Weight Management Yes;Obesity;Weight Loss    Intervention Weight Management: Develop a combined nutrition and exercise program designed to reach desired caloric intake, while maintaining appropriate intake of nutrient and fiber, sodium and fats, and appropriate energy expenditure required for the weight goal.;Weight Management: Provide education and appropriate resources to help participant work on and attain dietary goals.;Weight Management/Obesity: Establish reasonable short term and long term weight goals.;Obesity: Provide education and appropriate resources to help participant work on and attain dietary goals.     Expected Outcomes Short Term: Continue to assess and modify interventions until short term weight is achieved;Long Term: Adherence to nutrition and physical activity/exercise program aimed toward attainment of established weight goal;Weight Maintenance: Understanding of the daily nutrition guidelines, which includes 25-35% calories from fat, 7% or less cal from saturated fats, less than 200mg  cholesterol, less than 1.5gm of sodium, & 5 or more servings of fruits and vegetables daily;Weight Loss: Understanding of general recommendations for a balanced deficit meal plan, which promotes 1-2 lb weight loss per week and includes a negative energy balance of [phone] kcal/d;Understanding recommendations for meals to include 15-35% energy as protein, 25-35% energy from fat, 35-60% energy from carbohydrates, less than 200mg  of dietary cholesterol, 20-35 gm of total fiber daily;Understanding of distribution of calorie intake throughout the day with the consumption of 4-5 meals/snacks    Tobacco Cessation Yes    Number of packs per day does not smoke, but previously dipped. Currently using nicotine salt pouches    Intervention Assist the participant in steps to quit. Provide individualized education and counseling about committing to Tobacco Cessation, relapse prevention, and pharmacological support that can be provided by physician.    Expected Outcomes Long Term: Complete abstinence from  all tobacco products for at least 12 months from quit date.    Heart Failure Yes    Intervention Provide a combined exercise and nutrition program that is supplemented with education, support and counseling about heart failure. Directed toward relieving symptoms such as shortness of breath, decreased exercise tolerance, and extremity edema.    Expected Outcomes Improve functional capacity of life;Short term: Attendance in program 2-3 days a week with increased exercise capacity. Reported lower sodium intake. Reported increased fruit  and vegetable intake. Reports medication compliance.;Short term: Daily weights obtained and reported for increase. Utilizing diuretic protocols set by physician.;Long term: Adoption of self-care skills and reduction of barriers for early signs and symptoms recognition and intervention leading to self-care maintenance.    Hypertension Yes    Intervention Provide education on lifestyle modifcations including regular physical activity/exercise, weight management, moderate sodium restriction and increased consumption of fresh fruit, vegetables, and low fat dairy, alcohol moderation, and smoking cessation.;Monitor prescription use compliance.    Expected Outcomes Short Term: Continued assessment and intervention until BP is < 140/57mm HG in hypertensive participants. < 130/66mm HG in hypertensive participants with diabetes, heart failure or chronic kidney disease.;Long Term: Maintenance of blood pressure at goal levels.    Personal Goal Other Yes    Personal Goal To feel confident exercising on his own again.    Intervention Attend CR three days per week and begin a home exercise program.    Expected Outcomes Pt will meet stated goal.             Core Components/Risk Factors/Patient Goals Review:    Core Components/Risk Factors/Patient Goals at Discharge (Final Review):    ITP Comments:   Comments: Patient arrived for 1st visit/orientation/education at 1230. Patient was referred to CR by Dr. Tenny Craw due to Atrial fibrillation, persistent (I48.19). During orientation advised patient on arrival and appointment times what to wear, what to do before, during and after exercise. Reviewed attendance and class policy.  Pt is scheduled to return Cardiac Rehab on 11/19/2022 at 1100. Pt was advised to come to class 15 minutes before class starts.  Discussed RPE/Dpysnea scales. Patient participated in warm up stretches. Patient was able to complete 6 minute walk test.  Telemetry: NSR. Patient was measured for  the equipment. Discussed equipment safety with patient. Took patient pre-anthropometric measurements. Patient finished visit at 1330.

## 2022-11-19 ENCOUNTER — Encounter (HOSPITAL_COMMUNITY)
Admission: RE | Admit: 2022-11-19 | Discharge: 2022-11-19 | Disposition: A | Discharge status: Home / Self Care | Payer: 59 | Source: Ambulatory Visit | Attending: Cardiothoracic Surgery | Admitting: Cardiothoracic Surgery

## 2022-11-19 DIAGNOSIS — Z5189 Encounter for other specified aftercare: Secondary | ICD-10-CM | POA: Diagnosis not present

## 2022-11-19 DIAGNOSIS — I4819 Other persistent atrial fibrillation: Secondary | ICD-10-CM | POA: Diagnosis present

## 2022-11-19 NOTE — Progress Notes (Signed)
Daily Session Note  Patient Details  Name: Nathaniel [name] MRN: [id number] Date of Birth: 1991/06/16 Referring Provider:   Flowsheet Row CARDIAC REHAB PHASE II ORIENTATION from 11/16/2022 in [place]  Referring Provider Dr. Tenny Craw       Encounter Date: 11/19/2022  Check In:  Session Check In - 11/19/22 1056       Check-In   Supervising physician immediately available to respond to emergencies [facility] MD immediately available    Physician(s) Dr Harl Bowie    Location [facility]    Staff Present Leana Roe, BS, Exercise Physiologist;Debra Wynetta Emery, RN, Joanette Gula, RN, BSN    Virtual Visit No    Medication changes reported     No    Fall or balance concerns reported    No    Tobacco Cessation No Change    Warm-up and Cool-down Performed as group-led instruction    Resistance Training Performed Yes    VAD Patient? No    PAD/SET Patient? No      Pain Assessment   Currently in Pain? No/denies    Pain Score 0-No pain    Multiple Pain Sites No             Capillary Blood Glucose: No results found for this or any previous visit (from the past 24 hour(s)).    Social History   Tobacco Use  Smoking Status Former   Packs/day: .5   Types: Cigarettes   Quit date: 2016   Years since quitting: 8.2  Smokeless Tobacco Current   Types: Snuff  Tobacco Comments   Nicotine patches sent 07/12/22    Goals Met:  Independence with exercise equipment Exercise tolerated well No report of concerns or symptoms today Strength training completed today  Goals Unmet:  Not Applicable  Comments: Checkout at 1200.   Dr. Carlyle Dolly is Medical Director for [facility] Cardiac Re[facility]

## 2022-11-22 ENCOUNTER — Encounter (HOSPITAL_COMMUNITY)
Admission: RE | Admit: 2022-11-22 | Discharge: 2022-11-22 | Disposition: A | Discharge status: Home / Self Care | Payer: 59 | Source: Ambulatory Visit | Attending: Cardiothoracic Surgery | Admitting: Cardiothoracic Surgery

## 2022-11-22 VITALS — Wt 306.4 lb

## 2022-11-22 DIAGNOSIS — I4819 Other persistent atrial fibrillation: Secondary | ICD-10-CM

## 2022-11-22 DIAGNOSIS — Z5189 Encounter for other specified aftercare: Secondary | ICD-10-CM | POA: Diagnosis not present

## 2022-11-22 NOTE — Progress Notes (Signed)
Daily Session Note  Patient Details  Name: Nathaniel [name] MRN: [id number] Date of Birth: 08/27/1991 Referring Provider:   Flowsheet Row CARDIAC REHAB PHASE II ORIENTATION from 11/16/2022 in [place]  Referring Provider Dr. Tenny Craw       Encounter Date: 11/22/2022  Check In:  Session Check In - 11/22/22 1058       Check-In   Supervising physician immediately available to respond to emergencies [facility] MD immediately available    Physician(s) Dr Domenic Polite    Location [facility]    Staff Present Leana Roe, BS, Exercise Physiologist;Debra Wynetta Emery, RN, Joanette Gula, RN, BSN    Virtual Visit No    Medication changes reported     No    Fall or balance concerns reported    No    Tobacco Cessation No Change    Warm-up and Cool-down Performed as group-led instruction    Resistance Training Performed Yes    VAD Patient? No    PAD/SET Patient? No      Pain Assessment   Currently in Pain? No/denies    Pain Score 0-No pain    Multiple Pain Sites No             Capillary Blood Glucose: No results found for this or any previous visit (from the past 24 hour(s)).    Social History   Tobacco Use  Smoking Status Former   Packs/day: .5   Types: Cigarettes   Quit date: 2016   Years since quitting: 8.2  Smokeless Tobacco Current   Types: Snuff  Tobacco Comments   Nicotine patches sent 07/12/22    Goals Met:  Independence with exercise equipment Exercise tolerated well No report of concerns or symptoms today Strength training completed today  Goals Unmet:  Not Applicable  Comments: Checkout at 1200.   Dr. Carlyle Dolly is Medical Director for [facility] Cardiac Rehab

## 2022-11-24 ENCOUNTER — Encounter (HOSPITAL_COMMUNITY)
Admission: RE | Admit: 2022-11-24 | Discharge: 2022-11-24 | Disposition: A | Discharge status: Home / Self Care | Payer: 59 | Source: Ambulatory Visit | Attending: Cardiothoracic Surgery | Admitting: Cardiothoracic Surgery

## 2022-11-24 DIAGNOSIS — I4819 Other persistent atrial fibrillation: Secondary | ICD-10-CM

## 2022-11-24 DIAGNOSIS — Z5189 Encounter for other specified aftercare: Secondary | ICD-10-CM | POA: Diagnosis not present

## 2022-11-24 NOTE — Progress Notes (Signed)
Daily Session Note  Patient Details  Name: Nathaniel [name] MRN: [id number] Date of Birth: 1990/12/10 Referring Provider:   Flowsheet Row CARDIAC REHAB PHASE II ORIENTATION from 11/16/2022 in [place]  Referring Provider Dr. Tenny Craw       Encounter Date: 11/24/2022  Check In:  Session Check In - 11/24/22 1100       Check-In   Supervising physician immediately available to respond to emergencies [facility] MD immediately available    Physician(s) Dr Domenic Polite    Location [facility]    Staff Present Leana Roe, BS, Exercise Physiologist;Hillary Troutman BSN, RN    Virtual Visit No    Medication changes reported     No    Fall or balance concerns reported    No    Tobacco Cessation No Change    Warm-up and Cool-down Performed as group-led instruction    Resistance Training Performed Yes    VAD Patient? No    PAD/SET Patient? No      Pain Assessment   Currently in Pain? No/denies    Pain Score 0-No pain    Multiple Pain Sites No             Capillary Blood Glucose: No results found for this or any previous visit (from the past 24 hour(s)).    Social History   Tobacco Use  Smoking Status Former   Packs/day: .5   Types: Cigarettes   Quit date: 2016   Years since quitting: 8.2  Smokeless Tobacco Current   Types: Snuff  Tobacco Comments   Nicotine patches sent 07/12/22    Goals Met:  Independence with exercise equipment Exercise tolerated well No report of concerns or symptoms today Strength training completed today  Goals Unmet:  Not Applicable  Comments: check out 1200   Dr. Carlyle Dolly is Medical Director for [place]

## 2022-11-24 NOTE — Progress Notes (Signed)
Cardiac Individual Treatment Plan  Patient Details  Name: Nathaniel BARNHARD MRN: [id number] Date of Birth: 1991/01/15 Referring Provider:   Flowsheet Row CARDIAC REHAB PHASE II ORIENTATION from 11/16/2022 in [place]  Referring Provider Dr. Tenny Craw       Initial Encounter Date:  Flowsheet Row CARDIAC REHAB PHASE II ORIENTATION from 11/16/2022 in [place]  Date 11/16/22       Visit Diagnosis: Atrial fibrillation, persistent ([place])  Patient's Home Medications on Admission:  Current Outpatient Medications:    acetaminophen (TYLENOL) 650 MG CR tablet, Take 650 mg by mouth every 8 (eight) hours as needed for pain., Disp: , Rfl:    amoxicillin (AMOXIL) 875 MG tablet, Take 875 mg by mouth 2 (two) times daily., Disp: , Rfl:    clonazePAM (KLONOPIN) 0.5 MG tablet, Take 0.5 tablets (0.25 mg total) by mouth 2 (two) times daily., Disp: 30 tablet, Rfl: 0   colchicine 0.6 MG tablet, Take 1 tablet (0.6 mg total) by mouth daily for 7 days., Disp: 7 tablet, Rfl: 0   dofetilide (TIKOSYN) 500 MCG capsule, Take 1 capsule (500 mcg total) by mouth 2 (two) times daily., Disp: 30 capsule, Rfl: 2   ELIQUIS 5 MG TABS tablet, TAKE 1 TABLET BY MOUTH TWICE DAILY, Disp: 60 tablet, Rfl: 5   methylPREDNISolone (MEDROL DOSEPAK) 4 MG TBPK tablet, Use as directed on pack., Disp: 1 each, Rfl: 0   metoprolol succinate (TOPROL-XL) 50 MG 24 hr tablet, Take 1 tablet (50 mg total) by mouth daily at 8 pm. Take with or immediately following a meal., Disp: 30 tablet, Rfl: 2   Multiple Vitamin (ONE-A-DAY MENS PO), Take 1 tablet by mouth every morning., Disp: , Rfl:    nicotine (NICODERM CQ - DOSED IN MG/24 HOURS) 21 mg/24hr patch, Place 1 patch (21 mg total) onto the skin daily. (Patient taking differently: Place 21 mg onto the skin daily as needed (Stop smoking).), Disp: 28 patch, Rfl: 0   pantoprazole (PROTONIX) 40 MG tablet, TAKE 1 TABLET(40 MG) BY MOUTH DAILY, Disp: 90 tablet,  Rfl: 3   potassium chloride (KLOR-CON) 10 MEQ tablet, Take 1 tablet (10 mEq total) by mouth daily., Disp: 90 tablet, Rfl: 3   sertraline (ZOLOFT) 25 MG tablet, Take 1.5 tablets (37.5 mg total) by mouth daily. TAKE 1 AND 1/2 TABLETS BY MOUTH EVERY DAY (Patient taking differently: Take 25 mg by mouth daily.), Disp: 45 tablet, Rfl: 0   triamcinolone cream (KENALOG) 0.1 %, Apply 1 application topically daily as needed (for irritation). , Disp: , Rfl:   Past Medical History: Past Medical History:  Diagnosis Date   Acute pancreatitis without infection or necrosis 03/05/2016   Anginal pain (HCC)    Happens around moments of A.Fib   Anxiety    Arthritis    Back   Atrial fibrillation (HCC)    Atrial flutter ([place]) 01/17/2016   Admx 5/17 with AFlutter with RVR in setting of pancreatitis // CHADS2-VASc=1 (CHF) // Anticoag not started // Amiodarone >> NSR    Cardiomyopathy (HCC)    CHF (congestive heart failure) (HCC)    Chronic lower back pain    Chronic systolic CHF (congestive heart failure) ([place])    Echo 01/17/16 - EF 35-40%, diffuse HK, mild LAE    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[place]) 02/03/2016   a. EF 35-40% in setting of AFlutter with RVR in 5/17 // probably tachycardia mediated // needs FU echo 2-3 mos after NSR restored // b. Limited Echo 7/17:  EF 45-50%, diff HK, mild LAE   Depression    "last 2-3 months; never treated for it" (12/09/2016)   Dysrhythmia    A. Fib   Fatty liver 12/2015   per CT   Gastroenteritis 06/05/2017   GERD (gastroesophageal reflux disease)    Headache    "monthly" (12/09/2016)   Obesity    BMI 45 12/2015   Pancreatic pseudocyst    Pancreatitis 2012   recurrent acute. Admissions to [facility], 12/2015 admissin to [facility] hospital, ct then shows pseudocyst.    Pancreatitis 2012 X 2; 2017 X 2   Pancreatitis 03/05/2016   Paroxysmal atrial fibrillation (HCC)    Typical atrial flutter ([place]) 06/06/2017    Tobacco Use: Social History   Tobacco Use  Smoking Status Former    Packs/day: .5   Types: Cigarettes   Quit date: 2016   Years since quitting: 8.2  Smokeless Tobacco Current   Types: Snuff  Tobacco Comments   Nicotine patches sent 07/12/22    Labs: Review Flowsheet  More data exists      Latest Ref Rng & Units 01/23/2016 03/05/2016 05/11/2018 10/14/2022 10/18/2022  Labs for ITP Cardiac and Pulmonary Rehab  Cholestrol 0 - 200 mg/dL - 110  116  - -  LDL (calc) 0 - 99 mg/dL - 63  73  - -  HDL-C >40 mg/dL - 30  31  - -  Trlycerides <150 mg/dL 100  85  61  - -  PH, Arterial 7.35 - 7.45 - - - 7.47  7.325   PCO2 arterial 32 - 48 mmHg - - - 36  46.2   Bicarbonate 20.0 - 28.0 mmol/L - - - 26.2  24.1   TCO2 22 - 32 mmol/L - - - - 25  26   Acid-base deficit 0.0 - 2.0 mmol/L - - - - 2.0   O2 Saturation % - - - 99.4  100     Capillary Blood Glucose: No results found for: "GLUCAP"   Exercise Target Goals: Exercise Program Goal: Individual exercise prescription set using results from initial 6 min walk test and THRR while considering  patient's activity barriers and safety.   Exercise Prescription Goal: Starting with aerobic activity 30 plus minutes a day, 3 days per week for initial exercise prescription. Provide home exercise prescription and guidelines that participant acknowledges understanding prior to discharge.  Activity Barriers & Risk Stratification:  Activity Barriers & Cardiac Risk Stratification - 11/16/22 1312       Activity Barriers & Cardiac Risk Stratification   Activity Barriers Arthritis;Back Problems;Deconditioning;Muscular Weakness;Decreased Ventricular Function             6 Minute Walk:  6 Minute Walk     Row Name 11/16/22 1431         6 Minute Walk   Phase Initial     Distance 1500 feet     Walk Time 6 minutes     # of Rest Breaks 0     MPH 2.84     METS 4.25     RPE 11     VO2 Peak 14.88     Symptoms Yes (comment)     Comments lower back pain 5/10     Resting HR 61 bpm     Resting BP 116/70     Resting  Oxygen Saturation  96 %     Exercise Oxygen Saturation  during 6 min walk 98 %     Max Ex. HR 82 bpm  Max Ex. BP 132/72     2 Minute Post BP 118/70              Oxygen Initial Assessment:   Oxygen Re-Evaluation:   Oxygen Discharge (Final Oxygen Re-Evaluation):   Initial Exercise Prescription:  Initial Exercise Prescription - 11/16/22 1400       Date of Initial Exercise RX and Referring Provider   Date 11/16/22    Referring Provider Dr. Tenny Craw    Expected Discharge Date 02/09/23      Treadmill   MPH 2    Grade 0    Minutes 17      Recumbant Elliptical   Level 1    RPM 45    Minutes 22      Prescription Details   Frequency (times per week) 3    Duration Progress to 30 minutes of continuous aerobic without signs/symptoms of physical distress      Intensity   THRR 40-80% of Max Heartrate 75-150    Ratings of Perceived Exertion 11-13      Resistance Training   Training Prescription Yes    Weight 4    Reps 10-15             Perform Capillary Blood Glucose checks as needed.  Exercise Prescription Changes:   Exercise Prescription Changes     Row Name 11/22/22 1300             Response to Exercise   Blood Pressure (Admit) 128/82       Blood Pressure (Exercise) 134/82       Blood Pressure (Exit) 122/86       Heart Rate (Admit) 81 bpm       Heart Rate (Exercise) 109 bpm       Heart Rate (Exit) 90 bpm       Rating of Perceived Exertion (Exercise) 12       Duration Continue with 30 min of aerobic exercise without signs/symptoms of physical distress.       Intensity THRR unchanged         Progression   Progression Continue to progress workloads to maintain intensity without signs/symptoms of physical distress.         Resistance Training   Training Prescription Yes       Weight 5       Reps 10-15       Time 10 Minutes         Treadmill   MPH 2.3       Grade 0       Minutes 17       METs 2.76         Recumbant Elliptical   Level 2        RPM 56       Minutes 22       METs 2.1                Exercise Comments:   Exercise Goals and Review:   Exercise Goals     Row Name 11/16/22 1433 11/22/22 1317           Exercise Goals   Increase Physical Activity Yes Yes      Intervention Develop an individualized exercise prescription for aerobic and resistive training based on initial evaluation findings, risk stratification, comorbidities and participant's personal goals.;Provide advice, education, support and counseling about physical activity/exercise needs. Develop an individualized exercise prescription for aerobic and resistive training based on initial evaluation findings, risk stratification, comorbidities  and participant's personal goals.;Provide advice, education, support and counseling about physical activity/exercise needs.      Expected Outcomes Short Term: Attend rehab on a regular basis to increase amount of physical activity.;Long Term: Add in home exercise to make exercise part of routine and to increase amount of physical activity.;Long Term: Exercising regularly at least 3-5 days a week. Short Term: Attend rehab on a regular basis to increase amount of physical activity.;Long Term: Add in home exercise to make exercise part of routine and to increase amount of physical activity.;Long Term: Exercising regularly at least 3-5 days a week.      Increase Strength and Stamina Yes Yes      Intervention Provide advice, education, support and counseling about physical activity/exercise needs.;Develop an individualized exercise prescription for aerobic and resistive training based on initial evaluation findings, risk stratification, comorbidities and participant's personal goals. Provide advice, education, support and counseling about physical activity/exercise needs.;Develop an individualized exercise prescription for aerobic and resistive training based on initial evaluation findings, risk stratification, comorbidities  and participant's personal goals.      Expected Outcomes Short Term: Increase workloads from initial exercise prescription for resistance, speed, and METs.;Short Term: Perform resistance training exercises routinely during rehab and add in resistance training at home;Long Term: Improve cardiorespiratory fitness, muscular endurance and strength as measured by increased METs and functional capacity (6MWT) Short Term: Increase workloads from initial exercise prescription for resistance, speed, and METs.;Short Term: Perform resistance training exercises routinely during rehab and add in resistance training at home;Long Term: Improve cardiorespiratory fitness, muscular endurance and strength as measured by increased METs and functional capacity (6MWT)      Able to understand and use rate of perceived exertion (RPE) scale Yes Yes      Intervention Provide education and explanation on how to use RPE scale Provide education and explanation on how to use RPE scale      Expected Outcomes Short Term: Able to use RPE daily in rehab to express subjective intensity level;Long Term:  Able to use RPE to guide intensity level when exercising independently Short Term: Able to use RPE daily in rehab to express subjective intensity level;Long Term:  Able to use RPE to guide intensity level when exercising independently      Knowledge and understanding of Target Heart Rate Range (THRR) Yes Yes      Intervention Provide education and explanation of THRR including how the numbers were predicted and where they are located for reference Provide education and explanation of THRR including how the numbers were predicted and where they are located for reference      Expected Outcomes Short Term: Able to state/look up THRR;Short Term: Able to use daily as guideline for intensity in rehab;Long Term: Able to use THRR to govern intensity when exercising independently Short Term: Able to state/look up THRR;Short Term: Able to use daily as  guideline for intensity in rehab;Long Term: Able to use THRR to govern intensity when exercising independently      Able to check pulse independently Yes Yes      Intervention Provide education and demonstration on how to check pulse in carotid and radial arteries.;Review the importance of being able to check your own pulse for safety during independent exercise Provide education and demonstration on how to check pulse in carotid and radial arteries.;Review the importance of being able to check your own pulse for safety during independent exercise      Expected Outcomes Short Term: Able to explain why pulse  checking is important during independent exercise;Long Term: Able to check pulse independently and accurately Short Term: Able to explain why pulse checking is important during independent exercise;Long Term: Able to check pulse independently and accurately      Understanding of Exercise Prescription Yes Yes      Intervention Provide education, explanation, and written materials on patient's individual exercise prescription Provide education, explanation, and written materials on patient's individual exercise prescription      Expected Outcomes Short Term: Able to explain program exercise prescription;Long Term: Able to explain home exercise prescription to exercise independently Short Term: Able to explain program exercise prescription;Long Term: Able to explain home exercise prescription to exercise independently               Exercise Goals Re-Evaluation :  Exercise Goals Re-Evaluation     Row Name 11/22/22 1318             Exercise Goal Re-Evaluation   Exercise Goals Review Increase Physical Activity;Increase Strength and Stamina;Able to understand and use rate of perceived exertion (RPE) scale;Knowledge and understanding of Target Heart Rate Range (THRR);Able to check pulse independently;Understanding of Exercise Prescription       Comments Pt has completed 3 sessions of cardiac  rehab. He is very motivated to be in class. He is already starting to increase his speed on the treadmill each session. He is ready to push himself during class and is currently exercising at 2.76 METs on the treadmill. Will continue to monitor and progress as able.       Expected Outcomes Through exercise at rehab and home, the patient will meet their stated goals                 Discharge Exercise Prescription (Final Exercise Prescription Changes):  Exercise Prescription Changes - 11/22/22 1300       Response to Exercise   Blood Pressure (Admit) 128/82    Blood Pressure (Exercise) 134/82    Blood Pressure (Exit) 122/86    Heart Rate (Admit) 81 bpm    Heart Rate (Exercise) 109 bpm    Heart Rate (Exit) 90 bpm    Rating of Perceived Exertion (Exercise) 12    Duration Continue with 30 min of aerobic exercise without signs/symptoms of physical distress.    Intensity THRR unchanged      Progression   Progression Continue to progress workloads to maintain intensity without signs/symptoms of physical distress.      Resistance Training   Training Prescription Yes    Weight 5    Reps 10-15    Time 10 Minutes      Treadmill   MPH 2.3    Grade 0    Minutes 17    METs 2.76      Recumbant Elliptical   Level 2    RPM 56    Minutes 22    METs 2.1             Nutrition:  Target Goals: Understanding of nutrition guidelines, daily intake of sodium 1500mg , cholesterol 200mg , calories 30% from fat and 7% or less from saturated fats, daily to have 5 or more servings of fruits and vegetables.  Biometrics:  Pre Biometrics - 11/16/22 1434       Pre Biometrics   Height 6' (1.829 m)    Weight 141 kg    Waist Circumference 52 inches    Hip Circumference 50 inches    Waist to Hip Ratio 1.04 %    BMI (  Calculated) 42.15    Triceps Skinfold 52 mm    % Body Fat 42.1 %    Grip Strength 44.5 kg    Flexibility 0 in    Single Leg Stand 25.56 seconds               Nutrition Therapy Plan and Nutrition Goals:  Nutrition Therapy & Goals - 11/17/22 0703       Personal Nutrition Goals   Comments Patient scored 30 on his diet assessment. We have educational sessions on heart healthy nutrition with handouts and offer referral to RD if patient is interested.      Intervention Plan   Intervention Nutrition handout(s) given to patient.    Expected Outcomes Short Term Goal: Understand basic principles of dietary content, such as calories, fat, sodium, cholesterol and nutrients.             Nutrition Assessments:  Nutrition Assessments - 11/16/22 1253       MEDFICTS Scores   Pre Score 30            MEDIFICTS Score Key: ?70 Need to make dietary changes  40-70 Heart Healthy Diet ? 40 Therapeutic Level Cholesterol Diet   Picture Your Plate Scores: [id number] Unhealthy dietary pattern with much room for improvement. 41-50 Dietary pattern unlikely to meet recommendations for good health and room for improvement. 51-60 More healthful dietary pattern, with some room for improvement.  >60 Healthy dietary pattern, although there may be some specific behaviors that could be improved.    Nutrition Goals Re-Evaluation:   Nutrition Goals Discharge (Final Nutrition Goals Re-Evaluation):   Psychosocial: Target Goals: Acknowledge presence or absence of significant depression and/or stress, maximize coping skills, provide positive support system. Participant is able to verbalize types and ability to use techniques and skills needed for reducing stress and depression.  Initial Review & Psychosocial Screening:  Initial Psych Review & Screening - 11/16/22 1354       Initial Review   Current issues with Current Depression;Current Anxiety/Panic;Current Sleep Concerns;Current Stress Concerns;Current Psychotropic Meds    Source of Stress Concerns Chronic Illness;Financial;Retirement/disability    Comments He had been in a fib for the past three years  and unable to work. He also battles with panic attacks. His disability has been pending for 3 years now.      Family Dynamics   Good Support System? Yes    Comments His wife, parents, and in laws are his support system.      Barriers   Psychosocial barriers to participate in program The patient should benefit from training in stress management and relaxation.;There are no identifiable barriers or psychosocial needs.      Screening Interventions   Interventions Encouraged to exercise;Provide feedback about the scores to participant    Expected Outcomes Long Term goal: The participant improves quality of Life and PHQ9 Scores as seen by post scores and/or verbalization of changes;Short Term goal: Identification and review with participant of any Quality of Life or Depression concerns found by scoring the questionnaire.             Quality of Life Scores:  Quality of Life - 11/16/22 1434       Quality of Life   Select Quality of Life      Quality of Life Scores   Health/Function Pre 16.8 %    Socioeconomic Pre 18.57 %    Psych/Spiritual Pre 20.57 %    Family Pre 27 %    GLOBAL Pre 19.21 %  Scores of 19 and below usually indicate a poorer quality of life in these areas.  A difference of  2-3 points is a clinically meaningful difference.  A difference of 2-3 points in the total score of the Quality of Life Index has been associated with significant improvement in overall quality of life, self-image, physical symptoms, and general health in studies assessing change in quality of life.  PHQ-9: Review Flowsheet       11/16/2022 08/12/2022 06/22/2022  Depression screen PHQ 2/9  Decreased Interest 1    Down, Depressed, Hopeless 1    PHQ - 2 Score 2    Altered sleeping 1    Tired, decreased energy 2    Change in appetite 1    Feeling bad or failure about yourself  1    Trouble concentrating 2    Moving slowly or fidgety/restless 2    Suicidal thoughts 0    PHQ-9  Score 11    Difficult doing work/chores Somewhat difficult      Details       Information is confidential and restricted. Go to Review Flowsheets to unlock data.        Interpretation of Total Score  Total Score Depression Severity:  1-4 = Minimal depression, 5-9 = Mild depression, 10-14 = Moderate depression, 15-19 = Moderately severe depression, 20-27 = Severe depression   Psychosocial Evaluation and Intervention:  Psychosocial Evaluation - 11/16/22 1341       Psychosocial Evaluation & Interventions   Interventions Stress management education;Relaxation education;Encouraged to exercise with the program and follow exercise prescription    Comments Pt has no barriers to participate in CR. He reports that before his convergent ablation that he has been in atrial fibrillation for the past 3 years. He states that he has felt tired constantly and has been unable to work due to this. He does report issues with sleep, depression, anxiety, and stress; all of these have some relation to his atrial fibrillation. He states that his sleep has gotten much better over the past month since he as been out of atrial fibrillation. 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 His anxiety and depression are treated with Klonopin and Zoloft. He was recently put on Zoloft so that he can eventually be weaned off of benzodiazepines. He also routinely sees a Social worker. He reports that his anxiety and depression is well managed and that it is getting better since he is no longer in atrial fibrillation. His stress stems from his atrial fibrillation, lack of occupation, and disability status. He formerly worked at the [employer], but he has been unable to do so for the past 3 years. He has been fighting for disability for the past three years, and was previously denied twice, but this time a judge has approved it. He is hopeful that it will finally be approved by the government now. He  used to use smokeless tobacco (dip) and is working towards cessation. He has been using nicotine salt pouches to aid his cessation, and he hopes that he will be able to quit doing these as well. He reports that he has felt better over the past month than he has in years. He reports that he has a good support system with his wife, parents, and in laws. His goals in the program are to lose weight and to return to the activities that a normal 32 year old can do. He is highly motivated and eager to do  the program. His insurance out of pocket and deductible will start over in May, so he may only be able to do the program through April, but he is motivated to get as much out of this program as he can.    Expected Outcomes Pt's anxiety/depression will continue to be managed with Zoloft, Klonopin, and counseling. His stress and sleep concerns will continue to decrease as he stays out of atrial fibrillation.    Continue Psychosocial Services  Follow up required by staff             Psychosocial Re-Evaluation:  Psychosocial Re-Evaluation     [place] Name 11/17/22 [phone]             Psychosocial Re-Evaluation   Current issues with Current Sleep Concerns;Current Stress Concerns;History of Depression;Current Anxiety/Panic;Current Depression;Current Psychotropic Meds       Comments Patient is new to the program. He plans to start Monday 3/25. We will monitor his progress in the program.       Expected Outcomes Patient will continue to have no psychosocial barriers identified. His depression, anxiety, sleep and stress concerns will continue to be managed with Klonopin and Zolft and with routine counseling.       Interventions Stress management education;Encouraged to attend Cardiac Rehabilitation for the exercise;Relaxation education       Continue Psychosocial Services  No Follow up required         Initial Review   Source of Stress Concerns Chronic Illness;Financial;Retirement/disability       Comments He  had been in a fib for the past three years and unable to work. He also battles with panic attacks. His disability has been pending for 3 years now.                Psychosocial Discharge (Final Psychosocial Re-Evaluation):  Psychosocial Re-Evaluation - 11/17/22 0936       Psychosocial Re-Evaluation   Current issues with Current Sleep Concerns;Current Stress Concerns;History of Depression;Current Anxiety/Panic;Current Depression;Current Psychotropic Meds    Comments Patient is new to the program. He plans to start Monday 3/25. We will monitor his progress in the program.    Expected Outcomes Patient will continue to have no psychosocial barriers identified. His depression, anxiety, sleep and stress concerns will continue to be managed with Klonopin and Zolft and with routine counseling.    Interventions Stress management education;Encouraged to attend Cardiac Rehabilitation for the exercise;Relaxation education    Continue Psychosocial Services  No Follow up required      Initial Review   Source of Stress Concerns Chronic Illness;Financial;Retirement/disability    Comments He had been in a fib for the past three years and unable to work. He also battles with panic attacks. His disability has been pending for 3 years now.             Vocational Rehabilitation: Provide vocational rehab assistance to qualifying candidates.   Vocational Rehab Evaluation & Intervention:  Vocational Rehab - 11/16/22 1323       Initial Vocational Rehab Evaluation & Intervention   Assessment shows need for Vocational Rehabilitation No      Vocational Rehab Re-Evaulation   Comments He has applied for disability and it is pending.             Education: Education Goals: Education classes will be provided on a weekly basis, covering required topics. Participant will state understanding/return demonstration of topics presented.  Learning Barriers/Preferences:  Learning Barriers/Preferences -  11/16/22 1253  Learning Barriers/Preferences   Learning Barriers None    Learning Preferences Skilled Demonstration             Education Topics: Hypertension, Hypertension Reduction -Define heart disease and high blood pressure. Discus how high blood pressure affects the body and ways to reduce high blood pressure.   Exercise and Your Heart -Discuss why it is important to exercise, the FITT principles of exercise, normal and abnormal responses to exercise, and how to exercise safely.   Angina -Discuss definition of angina, causes of angina, treatment of angina, and how to decrease risk of having angina.   Cardiac Medications -Review what the following cardiac medications are used for, how they affect the body, and side effects that may occur when taking the medications.  Medications include Aspirin, Beta blockers, calcium channel blockers, ACE Inhibitors, angiotensin receptor blockers, diuretics, digoxin, and antihyperlipidemics.   Congestive Heart Failure -Discuss the definition of CHF, how to live with CHF, the signs and symptoms of CHF, and how keep track of weight and sodium intake.   Heart Disease and Intimacy -Discus the effect sexual activity has on the heart, how changes occur during intimacy as we age, and safety during sexual activity.   Smoking Cessation / COPD -Discuss different methods to quit smoking, the health benefits of quitting smoking, and the definition of COPD.   Nutrition I: Fats -Discuss the types of cholesterol, what cholesterol does to the heart, and how cholesterol levels can be controlled.   Nutrition II: Labels -Discuss the different components of food labels and how to read food label   Heart Parts/Heart Disease and PAD -Discuss the anatomy of the heart, the pathway of blood circulation through the heart, and these are affected by heart disease.   Stress I: Signs and Symptoms -Discuss the causes of stress, how stress may lead  to anxiety and depression, and ways to limit stress.   Stress II: Relaxation -Discuss different types of relaxation techniques to limit stress.   Warning Signs of Stroke / TIA -Discuss definition of a stroke, what the signs and symptoms are of a stroke, and how to identify when someone is having stroke.   Knowledge Questionnaire Score:  Knowledge Questionnaire Score - 11/16/22 1253       Knowledge Questionnaire Score   Pre Score 25/28             Core Components/Risk Factors/Patient Goals at Admission:  Personal Goals and Risk Factors at Admission - 11/16/22 1412       Core Components/Risk Factors/Patient Goals on Admission    Weight Management Yes;Obesity;Weight Loss    Intervention Weight Management: Develop a combined nutrition and exercise program designed to reach desired caloric intake, while maintaining appropriate intake of nutrient and fiber, sodium and fats, and appropriate energy expenditure required for the weight goal.;Weight Management: Provide education and appropriate resources to help participant work on and attain dietary goals.;Weight Management/Obesity: Establish reasonable short term and long term weight goals.;Obesity: Provide education and appropriate resources to help participant work on and attain dietary goals.    Expected Outcomes Short Term: Continue to assess and modify interventions until short term weight is achieved;Long Term: Adherence to nutrition and physical activity/exercise program aimed toward attainment of established weight goal;Weight Maintenance: Understanding of the daily nutrition guidelines, which includes 25-35% calories from fat, 7% or less cal from saturated fats, less than 200mg  cholesterol, less than 1.5gm of sodium, & 5 or more servings of fruits and vegetables daily;Weight Loss: Understanding of general recommendations  for a balanced deficit meal plan, which promotes 1-2 lb weight loss per week and includes a negative energy balance  of [id number] kcal/d;Understanding recommendations for meals to include 15-35% energy as protein, 25-35% energy from fat, 35-60% energy from carbohydrates, less than 200mg  of dietary cholesterol, 20-35 gm of total fiber daily;Understanding of distribution of calorie intake throughout the day with the consumption of 4-5 meals/snacks    Tobacco Cessation Yes    Number of packs per day does not smoke, but previously dipped. Currently using nicotine salt pouches    Intervention Assist the participant in steps to quit. Provide individualized education and counseling about committing to Tobacco Cessation, relapse prevention, and pharmacological support that can be provided by physician.    Expected Outcomes Long Term: Complete abstinence from all tobacco products for at least 12 months from quit date.    Heart Failure Yes    Intervention Provide a combined exercise and nutrition program that is supplemented with education, support and counseling about heart failure. Directed toward relieving symptoms such as shortness of breath, decreased exercise tolerance, and extremity edema.    Expected Outcomes Improve functional capacity of life;Short term: Attendance in program 2-3 days a week with increased exercise capacity. Reported lower sodium intake. Reported increased fruit and vegetable intake. Reports medication compliance.;Short term: Daily weights obtained and reported for increase. Utilizing diuretic protocols set by physician.;Long term: Adoption of self-care skills and reduction of barriers for early signs and symptoms recognition and intervention leading to self-care maintenance.    Hypertension Yes    Intervention Provide education on lifestyle modifcations including regular physical activity/exercise, weight management, moderate sodium restriction and increased consumption of fresh fruit, vegetables, and low fat dairy, alcohol moderation, and smoking cessation.;Monitor prescription use compliance.     Expected Outcomes Short Term: Continued assessment and intervention until BP is < 140/71mm HG in hypertensive participants. < 130/72mm HG in hypertensive participants with diabetes, heart failure or chronic kidney disease.;Long Term: Maintenance of blood pressure at goal levels.    Personal Goal Other Yes    Personal Goal To feel confident exercising on his own again.    Intervention Attend CR three days per week and begin a home exercise program.    Expected Outcomes Pt will meet stated goal.             Core Components/Risk Factors/Patient Goals Review:   Goals and Risk Factor Review     Row Name 11/17/22 0938             Core Components/Risk Factors/Patient Goals Review   Personal Goals Review Weight Management/Obesity;Hypertension;Heart Failure;Stress;Other       Review Patient was referred to CR with persistent AF. He has multiple risk factors for CAD and is participating in the program for risk modification. He plans to start the program Monday 3/25. His personal goals for the program are to lose weight, be able to return to his normal activities and feel confident exercising on his own. We will continue monitor his progress as he works towards meeting these [place]       Expected Outcomes Patient will do as much of the program before 5/1 meeting both program and personal goals.                Core Components/Risk Factors/Patient Goals at Discharge (Final Review):   Goals and Risk Factor Review - 11/17/22 0938       Core Components/Risk Factors/Patient Goals Review   Personal Goals Review Weight Management/Obesity;Hypertension;Heart Failure;Stress;Other  Review Patient was referred to CR with persistent AF. He has multiple risk factors for CAD and is participating in the program for risk modification. He plans to start the program Monday 3/25. His personal goals for the program are to lose weight, be able to return to his normal activities and feel confident exercising on  his own. We will continue monitor his progress as he works towards meeting these [place]    Expected Outcomes Patient will do as much of the program before 5/1 meeting both program and personal goals.             ITP Comments:   Comments: ITP REVIEW Pt is making expected progress toward Cardiac Rehab goals after completing 3 sessions. Recommend continued exercise, life style modification, education, and increased stamina and strength.

## 2022-11-25 ENCOUNTER — Ambulatory Visit: Payer: Self-pay | Admitting: Cardiothoracic Surgery

## 2022-11-26 ENCOUNTER — Encounter (HOSPITAL_COMMUNITY): Payer: Self-pay

## 2022-11-26 ENCOUNTER — Encounter (HOSPITAL_COMMUNITY)
Admission: RE | Admit: 2022-11-26 | Discharge: 2022-11-26 | Disposition: A | Discharge status: Home / Self Care | Payer: 59 | Source: Ambulatory Visit | Attending: Cardiothoracic Surgery | Admitting: Cardiothoracic Surgery

## 2022-11-26 DIAGNOSIS — Z5189 Encounter for other specified aftercare: Secondary | ICD-10-CM | POA: Diagnosis not present

## 2022-11-26 DIAGNOSIS — I4819 Other persistent atrial fibrillation: Secondary | ICD-10-CM

## 2022-11-26 NOTE — Progress Notes (Signed)
Daily Session Note  Patient Details  Name: Nathaniel [name] MRN: [id number] Date of Birth: [birth year] Referring Provider:   Flowsheet Row CARDIAC REHAB PHASE II ORIENTATION from 11/16/2022 in [place]  Referring Provider Dr. Tenny Craw       Encounter Date: 11/26/2022  Check In:  Session Check In - 11/26/22 1100       Check-In   Supervising physician immediately available to respond to emergencies [facility] MD immediately available    Physician(s) Dr. Harrington Challenger    Location [facility]    Staff Present Hoy Register MHA, MS, ACSM-CEP;Madelyn Flavors, RN, BSN    Virtual Visit No    Medication changes reported     No    Fall or balance concerns reported    No    Tobacco Cessation No Change    Warm-up and Cool-down Performed as group-led instruction    Resistance Training Performed Yes    VAD Patient? No    PAD/SET Patient? No      Pain Assessment   Currently in Pain? No/denies    Pain Score 0-No pain    Multiple Pain Sites No             Capillary Blood Glucose: No results found for this or any previous visit (from the past 24 hour(s)).    Social History   Tobacco Use  Smoking Status Former   Packs/day: .5   Types: Cigarettes   Quit date: 2016   Years since quitting: 8.2  Smokeless Tobacco Current   Types: Snuff  Tobacco Comments   Nicotine patches sent 07/12/22    Goals Met:  Independence with exercise equipment Exercise tolerated well No report of concerns or symptoms today Strength training completed today  Goals Unmet:  Not Applicable  Comments: checkout time is 1200   Dr. Carlyle Dolly is Medical Director for [facility]

## 2022-11-29 ENCOUNTER — Encounter (HOSPITAL_COMMUNITY)
Admission: RE | Admit: 2022-11-29 | Discharge: 2022-11-29 | Disposition: A | Discharge status: Home / Self Care | Payer: 59 | Source: Ambulatory Visit | Attending: Cardiothoracic Surgery | Admitting: Cardiothoracic Surgery

## 2022-11-29 DIAGNOSIS — I4819 Other persistent atrial fibrillation: Secondary | ICD-10-CM | POA: Diagnosis present

## 2022-11-29 DIAGNOSIS — Z5189 Encounter for other specified aftercare: Secondary | ICD-10-CM | POA: Diagnosis not present

## 2022-11-29 NOTE — Progress Notes (Signed)
Daily Session Note  Patient Details  Name: Nathaniel [name] MRN: [id number] Date of Birth: 10/19/1990 Referring Provider:   Flowsheet Row CARDIAC REHAB PHASE II ORIENTATION from 11/16/2022 in [facility] [place]  Referring Provider Dr. Tenny Craw       Encounter Date: 11/29/2022  Check In:  Session Check In - 11/29/22 1059       Check-In   Supervising physician immediately available to respond to emergencies [facility] MD immediately available    Physician(s) Dr Dellia Cloud    Location [facility]    Staff Present Hoy Register MHA, MS, ACSM-CEP;Madelyn Flavors, RN, BSN;Heather Mel Almond, BS, Exercise Physiologist    Virtual Visit No    Medication changes reported     No    Fall or balance concerns reported    No    Tobacco Cessation No Change    Warm-up and Cool-down Performed as group-led instruction    Resistance Training Performed Yes    VAD Patient? No    PAD/SET Patient? No      Pain Assessment   Currently in Pain? No/denies    Pain Score 0-No pain    Multiple Pain Sites No             Capillary Blood Glucose: No results found for this or any previous visit (from the past 24 hour(s)).    Social History   Tobacco Use  Smoking Status Former   Packs/day: .5   Types: Cigarettes   Quit date: 2016   Years since quitting: 8.2  Smokeless Tobacco Current   Types: Snuff  Tobacco Comments   Nicotine patches sent 07/12/22    Goals Met:  Independence with exercise equipment Exercise tolerated well No report of concerns or symptoms today Strength training completed today  Goals Unmet:  Not Applicable  Comments: checkout at 1200.   Dr. Carlyle Dolly is Medical Director for [facility] Cardiac Rehab

## 2022-11-29 NOTE — Progress Notes (Signed)
I have reviewed a Home Exercise Prescription with Nathaniel [name] . Nathaniel [name] is currently exercising at home using his own treadmill.  The patient was advised to walk 5 days a week for 30-45 minutes.  Masami and I discussed how to progress their exercise prescription.  The patient stated that their goals were to build up his endurance and strength and also lose weight/gain muscle.  The patient stated that they understand the exercise prescription.  We reviewed exercise guidelines, target heart rate during exercise, RPE Scale, weather conditions, NTG use, endpoints for exercise, warmup and cool down.  Patient is encouraged to come to me with any questions. I will continue to follow up with the patient to assist them with progression and safety.

## 2022-12-01 ENCOUNTER — Encounter (HOSPITAL_COMMUNITY): Payer: 59

## 2022-12-01 ENCOUNTER — Other Ambulatory Visit (HOSPITAL_COMMUNITY): Payer: Self-pay | Admitting: Psychiatry

## 2022-12-01 DIAGNOSIS — F410 Panic disorder [episodic paroxysmal anxiety] without agoraphobia: Secondary | ICD-10-CM

## 2022-12-01 MED ORDER — SERTRALINE HCL 50 MG PO TABS: 50.0000 mg | ORAL_TABLET | Freq: Every day | ORAL | 2 refills | Status: DC

## 2022-12-01 NOTE — Telephone Encounter (Addendum)
Confirmed with patient will titrate to 50mg  once daily.  From: Nathaniel [name] To: Office of Nathaniel Cree, MD Sent: 11/26/2022  1:15 PM EDT Subject: Medication Renewal Request  Refills have been requested for the following medications:      sertraline (ZOLOFT) 25 MG tablet [Nathaniel [name]]  Preferred pharmacy: [facility] [place] [address] Delivery method: [employer]

## 2022-12-03 ENCOUNTER — Encounter (HOSPITAL_COMMUNITY)
Admission: RE | Admit: 2022-12-03 | Discharge: 2022-12-03 | Disposition: A | Discharge status: Home / Self Care | Payer: 59 | Source: Ambulatory Visit | Attending: Cardiothoracic Surgery | Admitting: Cardiothoracic Surgery

## 2022-12-03 DIAGNOSIS — I4819 Other persistent atrial fibrillation: Secondary | ICD-10-CM | POA: Diagnosis not present

## 2022-12-03 DIAGNOSIS — Z5189 Encounter for other specified aftercare: Secondary | ICD-10-CM | POA: Diagnosis not present

## 2022-12-03 NOTE — Progress Notes (Signed)
Daily Session Note  Patient Details  Name: LOYCE NEMBHARD MRN: [id number] Date of Birth: [birth year] Referring Provider:   Flowsheet Row CARDIAC REHAB PHASE II ORIENTATION from 11/16/2022 in [facility] CARDIAC REHABILITATION  Referring Provider Dr. Delia Chimes       Encounter Date: 12/03/2022  Check In:  Session Check In - 12/03/22 1057       Check-In   Supervising physician immediately available to respond to emergencies [facility] MD immediately available    Physician(s) Dr Jenene Slicker    Location [facility]    Staff Present Brennan Karam Daphine Deutscher, RN, BSN;Heather Fredric Mare, BS, Exercise Physiologist;Debra Laural Benes, RN, BSN    Virtual Visit No    Medication changes reported     No    Fall or balance concerns reported    No    Tobacco Cessation No Change    Warm-up and Cool-down Performed as group-led instruction    Resistance Training Performed Yes      Pain Assessment   Currently in Pain? No/denies    Pain Score 0-No pain    Multiple Pain Sites No             Capillary Blood Glucose: No results found for this or any previous visit (from the past 24 hour(s)).    Social History   Tobacco Use  Smoking Status Former   Packs/day: .5   Types: Cigarettes   Quit date: 2016   Years since quitting: 8.2  Smokeless Tobacco Current   Types: Snuff  Tobacco Comments   Nicotine patches sent 07/12/22    Goals Met:  Independence with exercise equipment Exercise tolerated well No report of concerns or symptoms today Strength training completed today  Goals Unmet:  Not Applicable  Comments: Checkout at 1200.   Dr. Dina Rich is Medical Director for [facility]

## 2022-12-06 ENCOUNTER — Ambulatory Visit (INDEPENDENT_AMBULATORY_CARE_PROVIDER_SITE_OTHER): Payer: 59 | Admitting: Clinical

## 2022-12-06 ENCOUNTER — Encounter (HOSPITAL_COMMUNITY)
Admission: RE | Admit: 2022-12-06 | Discharge: 2022-12-06 | Disposition: A | Discharge status: Home / Self Care | Payer: 59 | Source: Ambulatory Visit | Attending: Cardiothoracic Surgery | Admitting: Cardiothoracic Surgery

## 2022-12-06 VITALS — Wt 310.0 lb

## 2022-12-06 DIAGNOSIS — I4819 Other persistent atrial fibrillation: Secondary | ICD-10-CM

## 2022-12-06 DIAGNOSIS — F419 Anxiety disorder, unspecified: Secondary | ICD-10-CM | POA: Diagnosis not present

## 2022-12-06 DIAGNOSIS — F331 Major depressive disorder, recurrent, moderate: Secondary | ICD-10-CM

## 2022-12-06 DIAGNOSIS — Z5189 Encounter for other specified aftercare: Secondary | ICD-10-CM | POA: Diagnosis not present

## 2022-12-06 NOTE — Progress Notes (Signed)
Daily Session Note  Patient Details  Name: Nathaniel [name] MRN: [id number] Date of Birth: May 06, 1991 Referring Provider:   Flowsheet Row CARDIAC REHAB PHASE II ORIENTATION from 11/16/2022 in [facility] CARDIAC REHABILITATION  Referring Provider Dr. Delia Chimes       Encounter Date: 12/06/2022  Check In:  Session Check In - 12/06/22 1058       Check-In   Supervising physician immediately available to respond to emergencies [facility] MD immediately available    Physician(s) Dr Wyline Mood    Location [facility]    Staff Present Aundreya Souffrant Daphine Deutscher, RN, BSN;Heather Fredric Mare, BS, Exercise Physiologist;Debra Laural Benes, RN, BSN;Dalton Fletcher MHA, MS, ACSM-CEP    Virtual Visit No    Medication changes reported     No    Fall or balance concerns reported    No    Tobacco Cessation No Change    Warm-up and Cool-down Performed as group-led instruction    Resistance Training Performed Yes    VAD Patient? No    PAD/SET Patient? No      Pain Assessment   Currently in Pain? No/denies    Pain Score 0-No pain    Multiple Pain Sites No             Capillary Blood Glucose: No results found for this or any previous visit (from the past 24 hour(s)).    Social History   Tobacco Use  Smoking Status Former   Packs/day: .5   Types: Cigarettes   Quit date: 2016   Years since quitting: 8.2  Smokeless Tobacco Current   Types: Snuff  Tobacco Comments   Nicotine patches sent 07/12/22    Goals Met:  Independence with exercise equipment Exercise tolerated well No report of concerns or symptoms today Strength training completed today  Goals Unmet:  Not Applicable  Comments: Checkout at 1200.   Dr. Dina Rich is Medical Director for [facility] Cardiac Rehab

## 2022-12-06 NOTE — Progress Notes (Signed)
Virtual Visit via Video Note   I connected with Nathaniel [name] on 12/06/22 at  2:00 PM EST by a video enabled telemedicine application and verified that I am speaking with the correct person using two identifiers.   Location: Patient: Home Provider: Office   I discussed the limitations of evaluation and management by telemedicine and the availability of in person appointments. The patient expressed understanding and agreed to proceed.     THERAPIST PROGRESS NOTE   Session Time: 2:00 PM- 2:30 PM   Participation Level: Active   Behavioral Response: CasualAlertDepressed   Type of Therapy: Individual Therapy   Treatment Goals addressed: Coping   Interventions: CBT, Motivational Interviewing, Strength-based and Supportive   Summary: Nathaniel [name] is a 32 y.o. male who presents with Depression with Anxiety. The OPT therapist worked with the patient for his OPT treatment .The OPT therapist utilized Motivational Interviewing to assist in creating therapeutic repore. The OPT therapist gained feedback about the patients triggers and symptoms over the past few week.The patient spoke about his experience doing cardiac rehab .The patient spoke about having pancreatitis post his surgery and working through this post The OPT therapist utilized Cognitive Behavioral Therapy through cognitive restructuring as well as worked on coping strategies to assist in management of his mental health symptoms. The OPT therapist continued to place emphasis on the patient and challenging ANTS (automatic negative thoughts) to improve his mental health. The OPT therapist worked with the patient on balancing stress/ with coping. The patient worked with the OPT therapist on keeping good boundaries in the different sections of his life and working on his reactive behavior.   Suicidal/Homicidal: Nowithout intent/plan   Therapist Response: The OPT therapist worked with the patient for the patients scheduled session.  The patient was engaged in his session and gave feedback in relation to triggers, symptoms, and behavior responses over the past few weeks. The OPT therapist worked utilizing an in Psychologist, forensic Therapy exercise. The patient noted, "    Due to my insurance which rolls over between April and May I am going to be able to have around 6 of the 12  rehab appointments covered by the insurance". The patient spoke about being mindful of work life balance and doing better at working on different sections of his life. The The OPT therapist worked with the patient to be mindful of self care and reviewed with the patient basic health areas including sleep cycle, eating habits,  physical exercise, and hygiene. The OPT therapist continued to review stress/ coping balance and management of different sections of his life. The OPT therapist will continue treatment work with the patient in his next session.      Plan: Follow up in 2/3 weeks   Diagnosis:      Axis I: Recurrent, Moderate Depressive Disorder with Anxiety                        Axis II: No diagnosis   Collaboration of Care: No additional collaboration for this session.   Patient/Guardian was advised Release of Information must be obtained prior to any record release in order to collaborate their care with an outside provider. Patient/Guardian was advised if they have not already done so to contact the registration department to sign all necessary forms in order for [place] to release information regarding their care.    Consent: Patient/Guardian gives verbal consent for treatment and assignment of benefits for services provided during this visit. Patient/Guardian  expressed understanding and agreed to proceed.    I discussed the assessment and treatment plan with the patient. The patient was provided an opportunity to ask questions and all were answered. The patient agreed with the plan and demonstrated an understanding of the instructions.   The  patient was advised to call back or seek an in-person evaluation if the symptoms worsen or if the condition fails to improve as anticipated.   I provided 30 minutes of non-face-to-face time during this encounter.     Suzan Garibaldi, LCSW   12/06/2022

## 2022-12-08 ENCOUNTER — Encounter (HOSPITAL_COMMUNITY)
Admission: RE | Admit: 2022-12-08 | Discharge: 2022-12-08 | Disposition: A | Discharge status: Home / Self Care | Payer: 59 | Source: Ambulatory Visit | Attending: Cardiothoracic Surgery | Admitting: Cardiothoracic Surgery

## 2022-12-08 DIAGNOSIS — I4819 Other persistent atrial fibrillation: Secondary | ICD-10-CM | POA: Diagnosis not present

## 2022-12-08 DIAGNOSIS — Z5189 Encounter for other specified aftercare: Secondary | ICD-10-CM | POA: Diagnosis not present

## 2022-12-08 NOTE — Progress Notes (Signed)
Daily Session Note  Patient Details  Name: Nathaniel [name] MRN: [id number] Date of Birth: 02/15/1991 Referring Provider:   Flowsheet Row CARDIAC REHAB PHASE II ORIENTATION from 11/16/2022 in [facility] CARDIAC REHABILITATION  Referring Provider Dr. Delia Chimes       Encounter Date: 12/08/2022  Check In:  Session Check In - 12/08/22 1105       Check-In   Supervising physician immediately available to respond to emergencies [facility] MD immediately available    Physician(s) Dr Wyline Mood    Location [facility]    Staff Present Reinhardt Licausi Daphine Deutscher, RN, BSN;Heather Fredric Mare, BS, Exercise Physiologist;Debra Laural Benes, RN, BSN    Virtual Visit No    Medication changes reported     No    Fall or balance concerns reported    No    Tobacco Cessation No Change    Warm-up and Cool-down Performed as group-led instruction    Resistance Training Performed Yes      Pain Assessment   Currently in Pain? No/denies    Pain Score 0-No pain    Multiple Pain Sites No             Capillary Blood Glucose: No results found for this or any previous visit (from the past 24 hour(s)).    Social History   Tobacco Use  Smoking Status Former   Packs/day: .5   Types: Cigarettes   Quit date: 2016   Years since quitting: 8.2  Smokeless Tobacco Current   Types: Snuff  Tobacco Comments   Nicotine patches sent 07/12/22    Goals Met:  Independence with exercise equipment Exercise tolerated well No report of concerns or symptoms today Strength training completed today  Goals Unmet:  Not Applicable  Comments: Checkout at 1200.   Dr. Dina Rich is Medical Director for [facility] Cardiac Rehab

## 2022-12-10 ENCOUNTER — Encounter (HOSPITAL_COMMUNITY)
Admission: RE | Admit: 2022-12-10 | Discharge: 2022-12-10 | Disposition: A | Discharge status: Home / Self Care | Payer: 59 | Source: Ambulatory Visit | Attending: Cardiothoracic Surgery | Admitting: Cardiothoracic Surgery

## 2022-12-10 DIAGNOSIS — Z5189 Encounter for other specified aftercare: Secondary | ICD-10-CM | POA: Diagnosis not present

## 2022-12-10 DIAGNOSIS — I4819 Other persistent atrial fibrillation: Secondary | ICD-10-CM | POA: Diagnosis not present

## 2022-12-10 NOTE — Progress Notes (Signed)
Daily Session Note  Patient Details  Name: Nathaniel [name] MRN: [id number] Date of Birth: Jan 22, 1991 Referring Provider:   Flowsheet Row CARDIAC REHAB PHASE II ORIENTATION from 11/16/2022 in [facility] CARDIAC REHABILITATION  Referring Provider Dr. Delia Chimes       Encounter Date: 12/10/2022  Check In:  Session Check In - 12/10/22 1058       Check-In   Supervising physician immediately available to respond to emergencies [facility] MD immediately available    Physician(s) Dr Wyline Mood    Location [facility]    Staff Present Maejor Erven Daphine Deutscher, RN, BSN;Heather Fredric Mare, BS, Exercise Physiologist;Debra Laural Benes, RN, BSN    Virtual Visit No    Medication changes reported     No    Fall or balance concerns reported    No    Tobacco Cessation No Change    Warm-up and Cool-down Performed as group-led instruction    Resistance Training Performed Yes      Pain Assessment   Currently in Pain? No/denies    Pain Score 0-No pain             Capillary Blood Glucose: No results found for this or any previous visit (from the past 24 hour(s)).    Social History   Tobacco Use  Smoking Status Former   Packs/day: .5   Types: Cigarettes   Quit date: 2016   Years since quitting: 8.2  Smokeless Tobacco Current   Types: Snuff  Tobacco Comments   Nicotine patches sent 07/12/22    Goals Met:  Independence with exercise equipment Exercise tolerated well No report of concerns or symptoms today Strength training completed today  Goals Unmet:  Not Applicable  Comments: Checkout at 1200.   Dr. Dina Rich is Medical Director for [facility] Cardiac Rehab

## 2022-12-13 ENCOUNTER — Encounter (HOSPITAL_COMMUNITY): Payer: 59

## 2022-12-15 ENCOUNTER — Encounter (HOSPITAL_COMMUNITY): Payer: 59

## 2022-12-17 ENCOUNTER — Encounter (HOSPITAL_COMMUNITY)
Admission: RE | Admit: 2022-12-17 | Discharge: 2022-12-17 | Disposition: A | Discharge status: Home / Self Care | Payer: 59 | Source: Ambulatory Visit | Attending: Cardiothoracic Surgery | Admitting: Cardiothoracic Surgery

## 2022-12-17 DIAGNOSIS — Z5189 Encounter for other specified aftercare: Secondary | ICD-10-CM | POA: Diagnosis not present

## 2022-12-17 DIAGNOSIS — I4819 Other persistent atrial fibrillation: Secondary | ICD-10-CM | POA: Diagnosis not present

## 2022-12-17 NOTE — Progress Notes (Signed)
Daily Session Note  Patient Details  Name: Nathaniel [name] MRN: [id number] Date of Birth: Aug 05, 1991 Referring Provider:   Flowsheet Row CARDIAC REHAB PHASE II ORIENTATION from 11/16/2022 in [facility] CARDIAC REHABILITATION  Referring Provider Dr. Delia Chimes       Encounter Date: 12/17/2022  Check In:  Session Check In - 12/17/22 1100       Check-In   Supervising physician immediately available to respond to emergencies [facility] MD immediately available    Physician(s) Dr. Dina Rich    Location [facility]    Staff Present Ross Ludwig, BS, Exercise Physiologist;Alric Geise Laural Benes, RN, BSN;Other   Kary Kos   Virtual Visit No    Medication changes reported     No    Fall or balance concerns reported    No    Tobacco Cessation No Change    Warm-up and Cool-down Performed as group-led instruction    Resistance Training Performed Yes    VAD Patient? No    PAD/SET Patient? No      Pain Assessment   Currently in Pain? No/denies    Pain Score 0-No pain    Multiple Pain Sites No             Capillary Blood Glucose: No results found for this or any previous visit (from the past 24 hour(s)).    Social History   Tobacco Use  Smoking Status Former   Packs/day: .5   Types: Cigarettes   Quit date: 2016   Years since quitting: 8.3  Smokeless Tobacco Current   Types: Snuff  Tobacco Comments   Nicotine patches sent 07/12/22    Goals Met:  Independence with exercise equipment Exercise tolerated well No report of concerns or symptoms today Strength training completed today  Goals Unmet:  Not Applicable  Comments: Check out 1200.   Dr. Dina Rich is Medical Director for [facility] Cardiac Rehab

## 2022-12-20 ENCOUNTER — Encounter (HOSPITAL_COMMUNITY)
Admission: RE | Admit: 2022-12-20 | Discharge: 2022-12-20 | Disposition: A | Discharge status: Home / Self Care | Payer: 59 | Source: Ambulatory Visit | Attending: Cardiothoracic Surgery | Admitting: Cardiothoracic Surgery

## 2022-12-20 VITALS — Wt 307.8 lb

## 2022-12-20 DIAGNOSIS — I4819 Other persistent atrial fibrillation: Secondary | ICD-10-CM | POA: Diagnosis not present

## 2022-12-20 DIAGNOSIS — Z5189 Encounter for other specified aftercare: Secondary | ICD-10-CM | POA: Diagnosis not present

## 2022-12-20 NOTE — Progress Notes (Signed)
Daily Session Note  Patient Details  Name: Nathaniel [name] MRN: [id number] Date of Birth: 11-Jul-1991 Referring Provider:   Flowsheet Row CARDIAC REHAB PHASE II ORIENTATION from 11/16/2022 in [facility] CARDIAC REHABILITATION  Referring Provider Dr. Delia Chimes       Encounter Date: 12/20/2022  Check In:  Session Check In - 12/20/22 1100       Check-In   Supervising physician immediately available to respond to emergencies [facility] MD immediately available    Physician(s) Dr. Jenene Slicker    Location [facility]    Staff Present Ross Ludwig, BS, Exercise Physiologist;Debra Laural Benes, RN, BSN;Other    Virtual Visit No    Medication changes reported     No    Fall or balance concerns reported    No    Tobacco Cessation No Change    Warm-up and Cool-down Performed as group-led instruction    Resistance Training Performed Yes    VAD Patient? No    PAD/SET Patient? No      Pain Assessment   Currently in Pain? No/denies    Pain Score 0-No pain    Multiple Pain Sites No             Capillary Blood Glucose: No results found for this or any previous visit (from the past 24 hour(s)).    Social History   Tobacco Use  Smoking Status Former   Packs/day: .5   Types: Cigarettes   Quit date: 2016   Years since quitting: 8.3  Smokeless Tobacco Current   Types: Snuff  Tobacco Comments   Nicotine patches sent 07/12/22    Goals Met:  Independence with exercise equipment Exercise tolerated well No report of concerns or symptoms today Strength training completed today  Goals Unmet:  Not Applicable  Comments: check out 1200   Dr. Dina Rich is Medical Director for [facility] Cardiac Rehab

## 2022-12-22 ENCOUNTER — Encounter (HOSPITAL_COMMUNITY)
Admission: RE | Admit: 2022-12-22 | Discharge: 2022-12-22 | Disposition: A | Discharge status: Home / Self Care | Payer: 59 | Source: Ambulatory Visit | Attending: Cardiothoracic Surgery | Admitting: Cardiothoracic Surgery

## 2022-12-22 DIAGNOSIS — Z5189 Encounter for other specified aftercare: Secondary | ICD-10-CM | POA: Diagnosis not present

## 2022-12-22 DIAGNOSIS — I4819 Other persistent atrial fibrillation: Secondary | ICD-10-CM

## 2022-12-22 NOTE — Progress Notes (Signed)
Cardiac Individual Treatment Plan  Patient Details  Name: Nathaniel [name] MRN: [id number] Date of Birth: 1990-09-18 Referring Provider:   Flowsheet Row CARDIAC REHAB PHASE II ORIENTATION from 11/16/2022 in [facility] CARDIAC REHABILITATION  Referring Provider Dr. Delia Chimes       Initial Encounter Date:  Flowsheet Row CARDIAC REHAB PHASE II ORIENTATION from 11/16/2022 in [place] CARDIAC REHABILITATION  Date 11/16/22       Visit Diagnosis: Atrial fibrillation, persistent  Patient's Home Medications on Admission:  Current Outpatient Medications:    acetaminophen (TYLENOL) 650 MG CR tablet, Take 650 mg by mouth every 8 (eight) hours as needed for pain., Disp: , Rfl:    amoxicillin (AMOXIL) 875 MG tablet, Take 875 mg by mouth 2 (two) times daily., Disp: , Rfl:    clonazePAM (KLONOPIN) 0.5 MG tablet, Take 0.5 tablets (0.25 mg total) by mouth 2 (two) times daily., Disp: 30 tablet, Rfl: 0   colchicine 0.6 MG tablet, Take 1 tablet (0.6 mg total) by mouth daily for 7 days., Disp: 7 tablet, Rfl: 0   dofetilide (TIKOSYN) 500 MCG capsule, Take 1 capsule (500 mcg total) by mouth 2 (two) times daily., Disp: 30 capsule, Rfl: 2   ELIQUIS 5 MG TABS tablet, TAKE 1 TABLET BY MOUTH TWICE DAILY, Disp: 60 tablet, Rfl: 5   methylPREDNISolone (MEDROL DOSEPAK) 4 MG TBPK tablet, Use as directed on pack., Disp: 1 each, Rfl: 0   metoprolol succinate (TOPROL-XL) 50 MG 24 hr tablet, Take 1 tablet (50 mg total) by mouth daily at 8 pm. Take with or immediately following a meal., Disp: 30 tablet, Rfl: 2   Multiple Vitamin (ONE-A-DAY MENS PO), Take 1 tablet by mouth every morning., Disp: , Rfl:    nicotine (NICODERM CQ - DOSED IN MG/24 HOURS) 21 mg/24hr patch, Place 1 patch (21 mg total) onto the skin daily. (Patient taking differently: Place 21 mg onto the skin daily as needed (Stop smoking).), Disp: 28 patch, Rfl: 0   pantoprazole (PROTONIX) 40 MG tablet, TAKE 1 TABLET(40 MG) BY MOUTH DAILY, Disp: 90 tablet, Rfl:  3   potassium chloride (KLOR-CON) 10 MEQ tablet, Take 1 tablet (10 mEq total) by mouth daily., Disp: 90 tablet, Rfl: 3   sertraline (ZOLOFT) 50 MG tablet, Take 1 tablet (50 mg total) by mouth daily., Disp: 30 tablet, Rfl: 2   triamcinolone cream (KENALOG) 0.1 %, Apply 1 application topically daily as needed (for irritation). , Disp: , Rfl:   Past Medical History: Past Medical History:  Diagnosis Date   Acute pancreatitis without infection or necrosis 03/05/2016   Anginal pain (HCC)    Happens around moments of A.Fib   Anxiety    Arthritis    Back   Atrial fibrillation (HCC)    Atrial flutter (HCC) 01/17/2016   Admx 5/17 with AFlutter with RVR in setting of pancreatitis // CHADS2-VASc=1 (CHF) // Anticoag not started // Amiodarone >> NSR    Cardiomyopathy (HCC)    CHF (congestive heart failure) (HCC)    Chronic lower back pain    Chronic systolic CHF (congestive heart failure) (HCC)    Echo 01/17/16 - EF 35-40%, diffuse HK, mild LAE    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HCC) 02/03/2016   a. EF 35-40% in setting of AFlutter with RVR in 5/17 // probably tachycardia mediated // needs FU echo 2-3 mos after NSR restored // b. Limited Echo 7/17: EF 45-50%, diff HK, mild LAE   Depression    "last 2-3 months; never treated for it" (  12/09/2016)   Dysrhythmia    A. Fib   Fatty liver 12/2015   per CT   Gastroenteritis 06/05/2017   GERD (gastroesophageal reflux disease)    Headache    "monthly" (12/09/2016)   Obesity    BMI 45 12/2015   Pancreatic pseudocyst    Pancreatitis 2012   recurrent acute. Admissions to [facility], 12/2015 admissin to [facility] hospital, ct then shows pseudocyst.    Pancreatitis 2012 X 2; 2017 X 2   Pancreatitis 03/05/2016   Paroxysmal atrial fibrillation (HCC)    Typical atrial flutter (HCC) 06/06/2017    Tobacco Use: Social History   Tobacco Use  Smoking Status Former   Packs/day: .5   Types: Cigarettes   Quit date: 2016   Years since quitting: 8.3  Smokeless Tobacco  Current   Types: Snuff  Tobacco Comments   Nicotine patches sent 07/12/22    Labs: Review Flowsheet  More data exists      Latest Ref Rng & Units 01/23/2016 03/05/2016 05/11/2018 10/14/2022 10/18/2022  Labs for ITP Cardiac and Pulmonary Rehab  Cholestrol 0 - 200 mg/dL - 161  096  - -  LDL (calc) 0 - 99 mg/dL - 63  73  - -  HDL-C >04 mg/dL - 30  31  - -  Trlycerides <150 mg/dL 540  85  61  - -  PH, Arterial 7.35 - 7.45 - - - 7.47  7.325   PCO2 arterial 32 - 48 mmHg - - - 36  46.2   Bicarbonate 20.0 - 28.0 mmol/L - - - 26.2  24.1   TCO2 22 - 32 mmol/L - - - - 25  26   Acid-base deficit 0.0 - 2.0 mmol/L - - - - 2.0   O2 Saturation % - - - 99.4  100     Capillary Blood Glucose: No results found for: "GLUCAP"   Exercise Target Goals: Exercise Program Goal: Individual exercise prescription set using results from initial 6 min walk test and THRR while considering  patient's activity barriers and safety.   Exercise Prescription Goal: Starting with aerobic activity 30 plus minutes a day, 3 days per week for initial exercise prescription. Provide home exercise prescription and guidelines that participant acknowledges understanding prior to discharge.  Activity Barriers & Risk Stratification:  Activity Barriers & Cardiac Risk Stratification - 11/16/22 1312       Activity Barriers & Cardiac Risk Stratification   Activity Barriers Arthritis;Back Problems;Deconditioning;Muscular Weakness;Decreased Ventricular Function             6 Minute Walk:  6 Minute Walk     Row Name 11/16/22 1431         6 Minute Walk   Phase Initial     Distance 1500 feet     Walk Time 6 minutes     # of Rest Breaks 0     MPH 2.84     METS 4.25     RPE 11     VO2 Peak 14.88     Symptoms Yes (comment)     Comments lower back pain 5/10     Resting HR 61 bpm     Resting BP 116/70     Resting Oxygen Saturation  96 %     Exercise Oxygen Saturation  during 6 min walk 98 %     Max Ex. HR 82 bpm      Max Ex. BP 132/72     2 Minute Post BP 118/70  Oxygen Initial Assessment:   Oxygen Re-Evaluation:   Oxygen Discharge (Final Oxygen Re-Evaluation):   Initial Exercise Prescription:  Initial Exercise Prescription - 11/16/22 1400       Date of Initial Exercise RX and Referring Provider   Date 11/16/22    Referring Provider Dr. Delia Chimes    Expected Discharge Date 02/09/23      Treadmill   MPH 2    Grade 0    Minutes 17      Recumbant Elliptical   Level 1    RPM 45    Minutes 22      Prescription Details   Frequency (times per week) 3    Duration Progress to 30 minutes of continuous aerobic without signs/symptoms of physical distress      Intensity   THRR 40-80% of Max Heartrate 75-150    Ratings of Perceived Exertion 11-13      Resistance Training   Training Prescription Yes    Weight 4    Reps 10-15             Perform Capillary Blood Glucose checks as needed.  Exercise Prescription Changes:   Exercise Prescription Changes     Row Name 11/22/22 1300 11/29/22 1300 12/06/22 1200 12/20/22 1300       Response to Exercise   Blood Pressure (Admit) 128/82 -- 130/80 124/80    Blood Pressure (Exercise) 134/82 -- 140/84 128/68    Blood Pressure (Exit) 122/86 -- 106/72 120/80    Heart Rate (Admit) 81 bpm -- 74 bpm 75 bpm    Heart Rate (Exercise) 109 bpm -- 107 bpm 119 bpm    Heart Rate (Exit) 90 bpm -- 83 bpm 84 bpm    Rating of Perceived Exertion (Exercise) 12 -- 12 11    Duration Continue with 30 min of aerobic exercise without signs/symptoms of physical distress. -- Continue with 30 min of aerobic exercise without signs/symptoms of physical distress. Continue with 30 min of aerobic exercise without signs/symptoms of physical distress.    Intensity THRR unchanged -- THRR unchanged THRR unchanged      Progression   Progression Continue to progress workloads to maintain intensity without signs/symptoms of physical distress. -- Continue to  progress workloads to maintain intensity without signs/symptoms of physical distress. Continue to progress workloads to maintain intensity without signs/symptoms of physical distress.      Resistance Training   Training Prescription Yes -- Yes Yes    Weight 5 -- 8 8    Reps 10-15 -- 10-15 10-15    Time 10 Minutes -- 10 Minutes 10 Minutes      Treadmill   MPH 2.3 -- 2.8 2.8    Grade 0 -- 0 0    Minutes 17 -- 17 17    METs 2.76 -- 3.14 3.17      Recumbant Elliptical   Level 2 -- 4 4    RPM 56 -- 56 55    Minutes 22 -- 22 22    METs 2.1 -- 2.4 3.14      Home Exercise Plan   Plans to continue exercise at -- Home (comment) -- --    Frequency -- Add 2 additional days to program exercise sessions. -- --    Initial Home Exercises Provided -- 11/29/22 -- --             Exercise Comments:   Exercise Comments     Row Name 11/29/22 1334  Exercise Comments home exercise reviewed                Exercise Goals and Review:   Exercise Goals     Row Name 11/16/22 1433 11/22/22 1317 12/20/22 1348         Exercise Goals   Increase Physical Activity Yes Yes Yes     Intervention Develop an individualized exercise prescription for aerobic and resistive training based on initial evaluation findings, risk stratification, comorbidities and participant's personal goals.;Provide advice, education, support and counseling about physical activity/exercise needs. Develop an individualized exercise prescription for aerobic and resistive training based on initial evaluation findings, risk stratification, comorbidities and participant's personal goals.;Provide advice, education, support and counseling about physical activity/exercise needs. Develop an individualized exercise prescription for aerobic and resistive training based on initial evaluation findings, risk stratification, comorbidities and participant's personal goals.;Provide advice, education, support and counseling about  physical activity/exercise needs.     Expected Outcomes Short Term: Attend rehab on a regular basis to increase amount of physical activity.;Long Term: Add in home exercise to make exercise part of routine and to increase amount of physical activity.;Long Term: Exercising regularly at least 3-5 days a week. Short Term: Attend rehab on a regular basis to increase amount of physical activity.;Long Term: Add in home exercise to make exercise part of routine and to increase amount of physical activity.;Long Term: Exercising regularly at least 3-5 days a week. Short Term: Attend rehab on a regular basis to increase amount of physical activity.;Long Term: Add in home exercise to make exercise part of routine and to increase amount of physical activity.;Long Term: Exercising regularly at least 3-5 days a week.     Increase Strength and Stamina Yes Yes Yes     Intervention Provide advice, education, support and counseling about physical activity/exercise needs.;Develop an individualized exercise prescription for aerobic and resistive training based on initial evaluation findings, risk stratification, comorbidities and participant's personal goals. Provide advice, education, support and counseling about physical activity/exercise needs.;Develop an individualized exercise prescription for aerobic and resistive training based on initial evaluation findings, risk stratification, comorbidities and participant's personal goals. Provide advice, education, support and counseling about physical activity/exercise needs.;Develop an individualized exercise prescription for aerobic and resistive training based on initial evaluation findings, risk stratification, comorbidities and participant's personal goals.     Expected Outcomes Short Term: Increase workloads from initial exercise prescription for resistance, speed, and METs.;Short Term: Perform resistance training exercises routinely during rehab and add in resistance training at  home;Long Term: Improve cardiorespiratory fitness, muscular endurance and strength as measured by increased METs and functional capacity ( ) Short Term: Increase workloads from initial exercise prescription for resistance, speed, and METs.;Short Term: Perform resistance training exercises routinely during rehab and add in resistance training at home;Long Term: Improve cardiorespiratory fitness, muscular endurance and strength as measured by increased METs and functional capacity ( ) Short Term: Increase workloads from initial exercise prescription for resistance, speed, and METs.;Short Term: Perform resistance training exercises routinely during rehab and add in resistance training at home;Long Term: Improve cardiorespiratory fitness, muscular endurance and strength as measured by increased METs and functional capacity ( )     Able to understand and use rate of perceived exertion (RPE) scale Yes Yes Yes     Intervention Provide education and explanation on how to use RPE scale Provide education and explanation on how to use RPE scale Provide education and explanation on how to use RPE scale     Expected Outcomes Short Term: Able to  use RPE daily in rehab to express subjective intensity level;Long Term:  Able to use RPE to guide intensity level when exercising independently Short Term: Able to use RPE daily in rehab to express subjective intensity level;Long Term:  Able to use RPE to guide intensity level when exercising independently Short Term: Able to use RPE daily in rehab to express subjective intensity level;Long Term:  Able to use RPE to guide intensity level when exercising independently     Knowledge and understanding of Target Heart Rate Range (THRR) Yes Yes Yes     Intervention Provide education and explanation of THRR including how the numbers were predicted and where they are located for reference Provide education and explanation of THRR including how the numbers were predicted and where  they are located for reference Provide education and explanation of THRR including how the numbers were predicted and where they are located for reference     Expected Outcomes Short Term: Able to state/look up THRR;Short Term: Able to use daily as guideline for intensity in rehab;Long Term: Able to use THRR to govern intensity when exercising independently Short Term: Able to state/look up THRR;Short Term: Able to use daily as guideline for intensity in rehab;Long Term: Able to use THRR to govern intensity when exercising independently Short Term: Able to state/look up THRR;Short Term: Able to use daily as guideline for intensity in rehab;Long Term: Able to use THRR to govern intensity when exercising independently     Able to check pulse independently Yes Yes Yes     Intervention Provide education and demonstration on how to check pulse in carotid and radial arteries.;Review the importance of being able to check your own pulse for safety during independent exercise Provide education and demonstration on how to check pulse in carotid and radial arteries.;Review the importance of being able to check your own pulse for safety during independent exercise Provide education and demonstration on how to check pulse in carotid and radial arteries.;Review the importance of being able to check your own pulse for safety during independent exercise     Expected Outcomes Short Term: Able to explain why pulse checking is important during independent exercise;Long Term: Able to check pulse independently and accurately Short Term: Able to explain why pulse checking is important during independent exercise;Long Term: Able to check pulse independently and accurately Short Term: Able to explain why pulse checking is important during independent exercise;Long Term: Able to check pulse independently and accurately     Understanding of Exercise Prescription Yes Yes Yes     Intervention Provide education, explanation, and written  materials on patient's individual exercise prescription Provide education, explanation, and written materials on patient's individual exercise prescription Provide education, explanation, and written materials on patient's individual exercise prescription     Expected Outcomes Short Term: Able to explain program exercise prescription;Long Term: Able to explain home exercise prescription to exercise independently Short Term: Able to explain program exercise prescription;Long Term: Able to explain home exercise prescription to exercise independently Short Term: Able to explain program exercise prescription;Long Term: Able to explain home exercise prescription to exercise independently              Exercise Goals Re-Evaluation :  Exercise Goals Re-Evaluation     Row Name 11/22/22 1318 12/20/22 1348           Exercise Goal Re-Evaluation   Exercise Goals Review Increase Physical Activity;Increase Strength and Stamina;Able to understand and use rate of perceived exertion (RPE) scale;Knowledge and understanding of  Target Heart Rate Range (THRR);Able to check pulse independently;Understanding of Exercise Prescription Increase Physical Activity;Increase Strength and Stamina;Able to understand and use rate of perceived exertion (RPE) scale;Knowledge and understanding of Target Heart Rate Range (THRR);Able to check pulse independently;Understanding of Exercise Prescription      Comments Pt has completed 3 sessions of cardiac rehab. He is very motivated to be in class. He is already starting to increase his speed on the treadmill each session. He is ready to push himself during class and is currently exercising at 2.76 METs on the treadmill. Will continue to monitor and progress as able. Pt has completed 12 sessions of cardiac rehab. He continue to be very motivated to increase his endurance. He is currently exercising at 3.14 METs on the treadmill. Will continue to monitor and progress as able.       Expected Outcomes Through exercise at rehab and home, the patient will meet their stated goals Through exercise at rehab and home, the patient will meet their stated goals                Discharge Exercise Prescription (Final Exercise Prescription Changes):  Exercise Prescription Changes - 12/20/22 1300       Response to Exercise   Blood Pressure (Admit) 124/80    Blood Pressure (Exercise) 128/68    Blood Pressure (Exit) 120/80    Heart Rate (Admit) 75 bpm    Heart Rate (Exercise) 119 bpm    Heart Rate (Exit) 84 bpm    Rating of Perceived Exertion (Exercise) 11    Duration Continue with 30 min of aerobic exercise without signs/symptoms of physical distress.    Intensity THRR unchanged      Progression   Progression Continue to progress workloads to maintain intensity without signs/symptoms of physical distress.      Resistance Training   Training Prescription Yes    Weight 8    Reps 10-15    Time 10 Minutes      Treadmill   MPH 2.8    Grade 0    Minutes 17    METs 3.17      Recumbant Elliptical   Level 4    RPM 55    Minutes 22    METs 3.14             Nutrition:  Target Goals: Understanding of nutrition guidelines, daily intake of sodium 1500mg , cholesterol 200mg , calories 30% from fat and 7% or less from saturated fats, daily to have 5 or more servings of fruits and vegetables.  Biometrics:  Pre Biometrics - 11/16/22 1434       Pre Biometrics   Height 6' (1.829 m)    Weight 141 kg    Waist Circumference 52 inches    Hip Circumference 50 inches    Waist to Hip Ratio 1.04 %    BMI (Calculated) 42.15    Triceps Skinfold 52 mm    % Body Fat 42.1 %    Grip Strength 44.5 kg    Flexibility 0 in    Single Leg Stand 25.56 seconds              Nutrition Therapy Plan and Nutrition Goals:  Nutrition Therapy & Goals - 11/17/22 0703       Personal Nutrition Goals   Comments Patient scored 30 on his diet assessment. We have educational sessions  on heart healthy nutrition with handouts and offer referral to RD if patient is interested.  Intervention Plan   Intervention Nutrition handout(s) given to patient.    Expected Outcomes Short Term Goal: Understand basic principles of dietary content, such as calories, fat, sodium, cholesterol and nutrients.             Nutrition Assessments:  Nutrition Assessments - 11/16/22 1253       MEDFICTS Scores   Pre Score 30            MEDIFICTS Score Key: ?70 Need to make dietary changes  40-70 Heart Healthy Diet ? 40 Therapeutic Level Cholesterol Diet   Picture Your Plate Scores: <16 Unhealthy dietary pattern with much room for improvement. 41-50 Dietary pattern unlikely to meet recommendations for good health and room for improvement. 51-60 More healthful dietary pattern, with some room for improvement.  >60 Healthy dietary pattern, although there may be some specific behaviors that could be improved.    Nutrition Goals Re-Evaluation:   Nutrition Goals Discharge (Final Nutrition Goals Re-Evaluation):   Psychosocial: Target Goals: Acknowledge presence or absence of significant depression and/or stress, maximize coping skills, provide positive support system. Participant is able to verbalize types and ability to use techniques and skills needed for reducing stress and depression.  Initial Review & Psychosocial Screening:  Initial Psych Review & Screening - 11/16/22 1354       Initial Review   Current issues with Current Depression;Current Anxiety/Panic;Current Sleep Concerns;Current Stress Concerns;Current Psychotropic Meds    Source of Stress Concerns Chronic Illness;Financial;Retirement/disability    Comments He had been in a fib for the past three years and unable to work. He also battles with panic attacks. His disability has been pending for 3 years now.      Family Dynamics   Good Support System? Yes    Comments His wife, parents, and in laws are his support  system.      Barriers   Psychosocial barriers to participate in program The patient should benefit from training in stress management and relaxation.;There are no identifiable barriers or psychosocial needs.      Screening Interventions   Interventions Encouraged to exercise;Provide feedback about the scores to participant    Expected Outcomes Long Term goal: The participant improves quality of Life and PHQ9 Scores as seen by post scores and/or verbalization of changes;Short Term goal: Identification and review with participant of any Quality of Life or Depression concerns found by scoring the questionnaire.             Quality of Life Scores:  Quality of Life - 11/16/22 1434       Quality of Life   Select Quality of Life      Quality of Life Scores   Health/Function Pre 16.8 %    Socioeconomic Pre 18.57 %    Psych/Spiritual Pre 20.57 %    Family Pre 27 %    GLOBAL Pre 19.21 %            Scores of 19 and below usually indicate a poorer quality of life in these areas.  A difference of  2-3 points is a clinically meaningful difference.  A difference of 2-3 points in the total score of the Quality of Life Index has been associated with significant improvement in overall quality of life, self-image, physical symptoms, and general health in studies assessing change in quality of life.  PHQ-9: Review Flowsheet       11/16/2022 08/12/2022 06/22/2022  Depression screen PHQ 2/9  Decreased Interest 1    Down, Depressed, Hopeless 1  PHQ - 2 Score 2    Altered sleeping 1    Tired, decreased energy 2    Change in appetite 1    Feeling bad or failure about yourself  1    Trouble concentrating 2    Moving slowly or fidgety/restless 2    Suicidal thoughts 0    PHQ-9 Score 11    Difficult doing work/chores Somewhat difficult      Details       Information is confidential and restricted. Go to Review Flowsheets to unlock data.        Interpretation of Total Score  Total  Score Depression Severity:  1-4 = Minimal depression, 5-9 = Mild depression, 10-14 = Moderate depression, 15-19 = Moderately severe depression, 20-27 = Severe depression   Psychosocial Evaluation and Intervention:  Psychosocial Evaluation - 11/16/22 1341       Psychosocial Evaluation & Interventions   Interventions Stress management education;Relaxation education;Encouraged to exercise with the program and follow exercise prescription    Comments Pt has no barriers to participate in CR. He reports that before his convergent ablation that he has been in atrial fibrillation for the past 3 years. He states that he has felt tired constantly and has been unable to work due to this. He does report issues with sleep, depression, anxiety, and stress; all of these have some relation to his atrial fibrillation. He states that his sleep has gotten much better over the past month since he as been out of atrial fibrillation. 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 His anxiety and depression are treated with Klonopin and Zoloft. He was recently put on Zoloft so that he can eventually be weaned off of benzodiazepines. He also routinely sees a Veterinary surgeon. He reports that his anxiety and depression is well managed and that it is getting better since he is no longer in atrial fibrillation. His stress stems from his atrial fibrillation, lack of occupation, and disability status. He formerly worked at the [employer], but he has been unable to do so for the past 3 years. He has been fighting for disability for the past three years, and was previously denied twice, but this time a judge has approved it. He is hopeful that it will finally be approved by the government now. He used to use smokeless tobacco (dip) and is working towards cessation. He has been using nicotine salt pouches to aid his cessation, and he hopes that he will be able to quit doing these as well. He reports that he has  felt better over the past month than he has in years. He reports that he has a good support system with his wife, parents, and in laws. His goals in the program are to lose weight and to return to the activities that a normal 32 year old can do. He is highly motivated and eager to do the program. His insurance out of pocket and deductible will start over in May, so he may only be able to do the program through April, but he is motivated to get as much out of this program as he can.    Expected Outcomes Pt's anxiety/depression will continue to be managed with Zoloft, Klonopin, and counseling. His stress and sleep concerns will continue to decrease as he stays out of atrial fibrillation.    Continue Psychosocial Services  Follow up required by staff             Psychosocial Re-Evaluation:  Psychosocial Re-Evaluation     Row Name 11/17/22 0936 12/13/22 1249           Psychosocial Re-Evaluation   Current issues with Current Sleep Concerns;Current Stress Concerns;History of Depression;Current Anxiety/Panic;Current Depression;Current Psychotropic Meds Current Sleep Concerns;Current Stress Concerns;History of Depression;Current Anxiety/Panic;Current Depression;Current Psychotropic Meds      Comments Patient is new to the program. He plans to start Monday 3/25. We will monitor his progress in the program. Patient has completed 10 sessions. His depression, anxiety, sleep and stress concerns continue to be managed with Zoloft and Klonopin. He also sees a Veterinary surgeon routinely. He feels his psychosocial issues are managed and he continues to have no psychosocial barriers identified. He seems to enjoy the sessions and demonstrates an interest in improving his health.  We will monitor his progress in the program.      Expected Outcomes Patient will continue to have no psychosocial barriers identified. His depression, anxiety, sleep and stress concerns will continue to be managed with Klonopin and Zolft and  with routine counseling. Patient will continue to have no psychosocial barriers identified. His depression, anxiety, sleep and stress concerns will continue to be managed with Klonopin and Zolft and with routine counseling.      Interventions Stress management education;Encouraged to attend Cardiac Rehabilitation for the exercise;Relaxation education Stress management education;Encouraged to attend Cardiac Rehabilitation for the exercise;Relaxation education      Continue Psychosocial Services  No Follow up required No Follow up required        Initial Review   Source of Stress Concerns Chronic Illness;Financial;Retirement/disability Chronic Illness;Financial;Retirement/disability      Comments He had been in a fib for the past three years and unable to work. He also battles with panic attacks. His disability has been pending for 3 years now. He had been in a fib for the past three years and unable to work. He also battles with panic attacks. His disability has been pending for 3 years now.               Psychosocial Discharge (Final Psychosocial Re-Evaluation):  Psychosocial Re-Evaluation - 12/13/22 1249       Psychosocial Re-Evaluation   Current issues with Current Sleep Concerns;Current Stress Concerns;History of Depression;Current Anxiety/Panic;Current Depression;Current Psychotropic Meds    Comments Patient has completed 10 sessions. His depression, anxiety, sleep and stress concerns continue to be managed with Zoloft and Klonopin. He also sees a Veterinary surgeon routinely. He feels his psychosocial issues are managed and he continues to have no psychosocial barriers identified. He seems to enjoy the sessions and demonstrates an interest in improving his health.  We will monitor his progress in the program.    Expected Outcomes Patient will continue to have no psychosocial barriers identified. His depression, anxiety, sleep and stress concerns will continue to be managed with Klonopin and Zolft  and with routine counseling.    Interventions Stress management education;Encouraged to attend Cardiac Rehabilitation for the exercise;Relaxation education    Continue Psychosocial Services  No Follow up required      Initial Review   Source of Stress Concerns Chronic Illness;Financial;Retirement/disability    Comments He had been in a fib for the past three years and unable to work. He also battles with panic attacks. His disability has been pending for 3 years now.             Vocational Rehabilitation: Provide vocational rehab assistance to qualifying candidates.   Vocational Rehab Evaluation & Intervention:  Vocational Rehab - 11/16/22 1323  Initial Vocational Rehab Evaluation & Intervention   Assessment shows need for Vocational Rehabilitation No      Vocational Rehab Re-Evaulation   Comments He has applied for disability and it is pending.             Education: Education Goals: Education classes will be provided on a weekly basis, covering required topics. Participant will state understanding/return demonstration of topics presented.  Learning Barriers/Preferences:  Learning Barriers/Preferences - 11/16/22 1253       Learning Barriers/Preferences   Learning Barriers None    Learning Preferences Skilled Demonstration             Education Topics: Hypertension, Hypertension Reduction -Define heart disease and high blood pressure. Discus how high blood pressure affects the body and ways to reduce high blood pressure. Flowsheet Row CARDIAC REHAB PHASE II EXERCISE from 12/22/2022 in [place] CARDIAC REHABILITATION  Date 12/08/22  Educator DJ  Instruction Review Code 2- Demonstrated Understanding       Exercise and Your Heart -Discuss why it is important to exercise, the FITT principles of exercise, normal and abnormal responses to exercise, and how to exercise safely.   Angina -Discuss definition of angina, causes of angina, treatment of  angina, and how to decrease risk of having angina. Flowsheet Row CARDIAC REHAB PHASE II EXERCISE from 12/22/2022 in [place] CARDIAC REHABILITATION  Date 12/22/22  Educator HB  Instruction Review Code 1- Verbalizes Understanding       Cardiac Medications -Review what the following cardiac medications are used for, how they affect the body, and side effects that may occur when taking the medications.  Medications include Aspirin, Beta blockers, calcium channel blockers, ACE Inhibitors, angiotensin receptor blockers, diuretics, digoxin, and antihyperlipidemics.   Congestive Heart Failure -Discuss the definition of CHF, how to live with CHF, the signs and symptoms of CHF, and how keep track of weight and sodium intake.   Heart Disease and Intimacy -Discus the effect sexual activity has on the heart, how changes occur during intimacy as we age, and safety during sexual activity.   Smoking Cessation / COPD -Discuss different methods to quit smoking, the health benefits of quitting smoking, and the definition of COPD.   Nutrition I: Fats -Discuss the types of cholesterol, what cholesterol does to the heart, and how cholesterol levels can be controlled.   Nutrition II: Labels -Discuss the different components of food labels and how to read food label   Heart Parts/Heart Disease and PAD -Discuss the anatomy of the heart, the pathway of blood circulation through the heart, and these are affected by heart disease.   Stress I: Signs and Symptoms -Discuss the causes of stress, how stress may lead to anxiety and depression, and ways to limit stress.   Stress II: Relaxation -Discuss different types of relaxation techniques to limit stress. Flowsheet Row CARDIAC REHAB PHASE II EXERCISE from 12/22/2022 in [place] CARDIAC REHABILITATION  Date 11/24/22  Educator HB  Instruction Review Code 2- Demonstrated Understanding       Warning Signs of Stroke / TIA -Discuss definition of a  stroke, what the signs and symptoms are of a stroke, and how to identify when someone is having stroke.   Knowledge Questionnaire Score:  Knowledge Questionnaire Score - 11/16/22 1253       Knowledge Questionnaire Score   Pre Score 25/28             Core Components/Risk Factors/Patient Goals at Admission:  Personal Goals and Risk Factors at  Admission - 11/16/22 1412       Core Components/Risk Factors/Patient Goals on Admission    Weight Management Yes;Obesity;Weight Loss    Intervention Weight Management: Develop a combined nutrition and exercise program designed to reach desired caloric intake, while maintaining appropriate intake of nutrient and fiber, sodium and fats, and appropriate energy expenditure required for the weight goal.;Weight Management: Provide education and appropriate resources to help participant work on and attain dietary goals.;Weight Management/Obesity: Establish reasonable short term and long term weight goals.;Obesity: Provide education and appropriate resources to help participant work on and attain dietary goals.    Expected Outcomes Short Term: Continue to assess and modify interventions until short term weight is achieved;Long Term: Adherence to nutrition and physical activity/exercise program aimed toward attainment of established weight goal;Weight Maintenance: Understanding of the daily nutrition guidelines, which includes 25-35% calories from fat, 7% or less cal from saturated fats, less than 200mg  cholesterol, less than 1.5gm of sodium, & 5 or more servings of fruits and vegetables daily;Weight Loss: Understanding of general recommendations for a balanced deficit meal plan, which promotes 1-2 lb weight loss per week and includes a negative energy balance of [phone] kcal/d;Understanding recommendations for meals to include 15-35% energy as protein, 25-35% energy from fat, 35-60% energy from carbohydrates, less than 200mg  of dietary cholesterol, 20-35 gm of  total fiber daily;Understanding of distribution of calorie intake throughout the day with the consumption of 4-5 meals/snacks    Tobacco Cessation Yes    Number of packs per day does not smoke, but previously dipped. Currently using nicotine salt pouches    Intervention Assist the participant in steps to quit. Provide individualized education and counseling about committing to Tobacco Cessation, relapse prevention, and pharmacological support that can be provided by physician.    Expected Outcomes Long Term: Complete abstinence from all tobacco products for at least 12 months from quit date.    Heart Failure Yes    Intervention Provide a combined exercise and nutrition program that is supplemented with education, support and counseling about heart failure. Directed toward relieving symptoms such as shortness of breath, decreased exercise tolerance, and extremity edema.    Expected Outcomes Improve functional capacity of life;Short term: Attendance in program 2-3 days a week with increased exercise capacity. Reported lower sodium intake. Reported increased fruit and vegetable intake. Reports medication compliance.;Short term: Daily weights obtained and reported for increase. Utilizing diuretic protocols set by physician.;Long term: Adoption of self-care skills and reduction of barriers for early signs and symptoms recognition and intervention leading to self-care maintenance.    Hypertension Yes    Intervention Provide education on lifestyle modifcations including regular physical activity/exercise, weight management, moderate sodium restriction and increased consumption of fresh fruit, vegetables, and low fat dairy, alcohol moderation, and smoking cessation.;Monitor prescription use compliance.    Expected Outcomes Short Term: Continued assessment and intervention until BP is < 140/57mm HG in hypertensive participants. < 130/70mm HG in hypertensive participants with diabetes, heart failure or chronic  kidney disease.;Long Term: Maintenance of blood pressure at goal levels.    Personal Goal Other Yes    Personal Goal To feel confident exercising on his own again.    Intervention Attend CR three days per week and begin a home exercise program.    Expected Outcomes Pt will meet stated goal.             Core Components/Risk Factors/Patient Goals Review:   Goals and Risk Factor Review     Row Name 11/17/22 [phone]  12/13/22 1251           Core Components/Risk Factors/Patient Goals Review   Personal Goals Review Weight Management/Obesity;Hypertension;Heart Failure;Stress;Other Weight Management/Obesity;Hypertension;Heart Failure;Stress;Other      Review Patient was referred to CR with persistent AF. He has multiple risk factors for CAD and is participating in the program for risk modification. He plans to start the program Monday 3/25. His personal goals for the program are to lose weight, be able to return to his normal activities and feel confident exercising on his own. We will continue monitor his progress as he works towards meeting these goalsl Patient has completed 10 sessions. His current weight is 310.6 losing 0.4 lbs since last 30 day review. He is doing well in the program with consistent attendance and progressions. He was seen by behavorial health 4/8 and stated he felt he was not going to be able to finish the program due to insurance but he has not told our staff this information. His blood pressure has been at goal. His personal goals for the program are to lose weight, be able to return to his normal activities and feel confident exercising on his own. We will continue monitor his progress as he works towards meeting these goals.      Expected Outcomes Patient will do as much of the program before 5/1 meeting both program and personal goals. Patient will do as much of the program before 5/1 meeting both program and personal goals.               Core Components/Risk  Factors/Patient Goals at Discharge (Final Review):   Goals and Risk Factor Review - 12/13/22 1251       Core Components/Risk Factors/Patient Goals Review   Personal Goals Review Weight Management/Obesity;Hypertension;Heart Failure;Stress;Other    Review Patient has completed 10 sessions. His current weight is 310.6 losing 0.4 lbs since last 30 day review. He is doing well in the program with consistent attendance and progressions. He was seen by behavorial health 4/8 and stated he felt he was not going to be able to finish the program due to insurance but he has not told our staff this information. His blood pressure has been at goal. His personal goals for the program are to lose weight, be able to return to his normal activities and feel confident exercising on his own. We will continue monitor his progress as he works towards meeting these goals.    Expected Outcomes Patient will do as much of the program before 5/1 meeting both program and personal goals.             ITP Comments:   Comments: ITP REVIEW Pt is making expected progress toward Cardiac Rehab goals after completing 13 sessions. Recommend continued exercise, life style modification, education, and increased stamina and strength.

## 2022-12-22 NOTE — Progress Notes (Signed)
Daily Session Note  Patient Details  Name: Nathaniel [name] MRN: [id number] Date of Birth: 03/18/1991 Referring Provider:   Flowsheet Row CARDIAC REHAB PHASE II ORIENTATION from 11/16/2022 in [facility] C[facility]AC RE[facility]  Referring Provider Dr. Delia Chimes       Encounter Date: 12/22/2022  Check In:  Session Check In - 12/22/22 1100       Check-In   Supervising physician immediately available to respond to emergencies [facility] MD immediately available    Physician(s) Dr. Jenene Slicker    Location [facility]    Staff Present Ross Ludwig, BS, Exercise Physiologist;Sayuri Rhames Laural Benes, RN, BSN;Christy Edwards, RN, BSN    Virtual Visit No    Medication changes reported     No    Fall or balance concerns reported    No    Tobacco Cessation No Change    Warm-up and Cool-down Performed as group-led Writer Performed Yes    VAD Patient? No    PAD/SET Patient? No      Pain Assessment   Currently in Pain? No/denies    Pain Score 0-No pain    Multiple Pain Sites No             Capillary Blood Glucose: No results found for this or any previous visit (from the past 24 hour(s)).    Social History   Tobacco Use  Smoking Status Former   Packs/day: .5   Types: Cigarettes   Quit date: 2016   Years since quitting: 8.3  Smokeless Tobacco Current   Types: Snuff  Tobacco Comments   Nicotine patches sent 07/12/22    Goals Met:  Independence with exercise equipment Exercise tolerated well No report of concerns or symptoms today Strength training completed today  Goals Unmet:  Not Applicable  Comments: Check out 1200.   Dr. Dina Rich is Medical Director for [facility] Cardiac Rehab

## 2022-12-24 ENCOUNTER — Encounter (HOSPITAL_COMMUNITY): Payer: 59

## 2022-12-27 ENCOUNTER — Encounter (HOSPITAL_COMMUNITY)
Admission: RE | Admit: 2022-12-27 | Discharge: 2022-12-27 | Disposition: A | Discharge status: Home / Self Care | Payer: 59 | Source: Ambulatory Visit | Attending: Cardiothoracic Surgery | Admitting: Cardiothoracic Surgery

## 2022-12-27 DIAGNOSIS — I4819 Other persistent atrial fibrillation: Secondary | ICD-10-CM

## 2022-12-27 DIAGNOSIS — Z5189 Encounter for other specified aftercare: Secondary | ICD-10-CM | POA: Diagnosis not present

## 2022-12-27 NOTE — Progress Notes (Signed)
Daily Session Note  Patient Details  Name: Nathaniel [name] MRN: [id number] Date of Birth: [birth year] Referring Provider:   Flowsheet Row CARDIAC REHAB PHASE II ORIENTATION from 11/16/2022 in [facility] CARDIAC REHABILITATION  Referring Provider Dr. Delia Chimes       Encounter Date: 12/27/2022  Check In:  Session Check In - 12/27/22 1100       Check-In   Supervising physician immediately available to respond to emergencies [facility] MD immediately available    Physician(s) Dr. Dina Rich    Location [facility]    Staff Present Rodena Medin, RN, Nathaniel Koch, RN, BSN    Virtual Visit No    Medication changes reported     No    Fall or balance concerns reported    No    Tobacco Cessation No Change    Warm-up and Cool-down Performed as group-led instruction    Resistance Training Performed Yes    VAD Patient? No    PAD/SET Patient? No      Pain Assessment   Currently in Pain? No/denies    Pain Score 0-No pain    Multiple Pain Sites No             Capillary Blood Glucose: No results found for this or any previous visit (from the past 24 hour(s)).    Social History   Tobacco Use  Smoking Status Former   Packs/day: .5   Types: Cigarettes   Quit date: 2016   Years since quitting: 8.3  Smokeless Tobacco Current   Types: Snuff  Tobacco Comments   Nicotine patches sent 07/12/22    Goals Met:  Independence with exercise equipment Exercise tolerated well No report of concerns or symptoms today Strength training completed today  Goals Unmet:  Not Applicable  Comments: Check out 1200.   Dr. Dina Rich is Medical Director for [facility] Cardiac Rehab

## 2022-12-29 ENCOUNTER — Encounter (HOSPITAL_COMMUNITY): Payer: 59

## 2022-12-29 NOTE — Progress Notes (Signed)
Discharge Progress Report  Patient Details  Name: Nathaniel [name] MRN: [id number] Date of Birth: 1991-02-09 Referring Provider:   Flowsheet Row CARDIAC REHAB PHASE II ORIENTATION from 11/16/2022 in [facility] CARDIAC REHABILITATION  Referring Provider Dr. Delia Chimes        Number of Visits: 12  Reason for Discharge:  Early Exit:  Insurance  Smoking History:  Social History   Tobacco Use  Smoking Status Former   Packs/day: .5   Types: Cigarettes   Quit date: 2016   Years since quitting: 8.3  Smokeless Tobacco Current   Types: Snuff  Tobacco Comments   Nicotine patches sent 07/12/22    Diagnosis:  Atrial fibrillation, persistent (HCC)  ADL [facility]:   Initial Exercise Prescription:  Initial Exercise Prescription - 11/16/22 1400       Date of Initial Exercise RX and Referring Provider   Date 11/16/22    Referring Provider Dr. Delia Chimes    Expected Discharge Date 02/09/23      Treadmill   MPH 2    Grade 0    Minutes 17      Recumbant Elliptical   Level 1    RPM 45    Minutes 22      Prescription Details   Frequency (times per week) 3    Duration Progress to 30 minutes of continuous aerobic without signs/symptoms of physical distress      Intensity   THRR 40-80% of Max Heartrate 75-150    Ratings of Perceived Exertion 11-13      Resistance Training   Training Prescription Yes    Weight 4    Reps 10-15             Discharge Exercise Prescription (Final Exercise Prescription Changes):  Exercise Prescription Changes - 12/20/22 1300       Response to Exercise   Blood Pressure (Admit) 124/80    Blood Pressure (Exercise) 128/68    Blood Pressure (Exit) 120/80    Heart Rate (Admit) 75 bpm    Heart Rate (Exercise) 119 bpm    Heart Rate (Exit) 84 bpm    Rating of Perceived Exertion (Exercise) 11    Duration Continue with 30 min of aerobic exercise without signs/symptoms of physical distress.    Intensity THRR unchanged      Progression   Progression  Continue to progress workloads to maintain intensity without signs/symptoms of physical distress.      Resistance Training   Training Prescription Yes    Weight 8    Reps 10-15    Time 10 Minutes      Treadmill   MPH 2.8    Grade 0    Minutes 17    METs 3.17      Recumbant Elliptical   Level 4    RPM 55    Minutes 22    METs 3.14             Functional Capacity:  6 Minute Walk     Row Name 11/16/22 1431         6 Minute Walk   Phase Initial     Distance 1500 feet     Walk Time 6 minutes     # of Rest Breaks 0     MPH 2.84     METS 4.25     RPE 11     VO2 Peak 14.88     Symptoms Yes (comment)     Comments lower back pain 5/10  Resting HR 61 bpm     Resting BP 116/70     Resting Oxygen Saturation  96 %     Exercise Oxygen Saturation  during 6 min walk 98 %     Max Ex. HR 82 bpm     Max Ex. BP 132/72     2 Minute Post BP 118/70              Psychological, QOL, Others - Outcomes: PHQ 2/9:    11/16/2022    1:10 PM 08/12/2022    2:17 PM 06/22/2022    2:34 PM  Depression screen PHQ 2/9  Decreased Interest 1    Down, Depressed, Hopeless 1    PHQ - 2 Score 2    Altered sleeping 1    Tired, decreased energy 2    Change in appetite 1    Feeling bad or failure about yourself  1    Trouble concentrating 2    Moving slowly or fidgety/restless 2    Suicidal thoughts 0    PHQ-9 Score 11    Difficult doing work/chores Somewhat difficult       Information is confidential and restricted. Go to Review Flowsheets to unlock data.    Quality of Life:  Quality of Life - 11/16/22 1434       Quality of Life   Select Quality of Life      Quality of Life Scores   Health/Function Pre 16.8 %    Socioeconomic Pre 18.57 %    Psych/Spiritual Pre 20.57 %    Family Pre 27 %    GLOBAL Pre 19.21 %             Personal Goals: Goals established at orientation with interventions provided to work toward goal.  Personal Goals and Risk Factors at  Admission - 11/16/22 1412       Core Components/Risk Factors/Patient Goals on Admission    Weight Management Yes;Obesity;Weight Loss    Intervention Weight Management: Develop a combined nutrition and exercise program designed to reach desired caloric intake, while maintaining appropriate intake of nutrient and fiber, sodium and fats, and appropriate energy expenditure required for the weight goal.;Weight Management: Provide education and appropriate resources to help participant work on and attain dietary goals.;Weight Management/Obesity: Establish reasonable short term and long term weight goals.;Obesity: Provide education and appropriate resources to help participant work on and attain dietary goals.    Expected Outcomes Short Term: Continue to assess and modify interventions until short term weight is achieved;Long Term: Adherence to nutrition and physical activity/exercise program aimed toward attainment of established weight goal;Weight Maintenance: Understanding of the daily nutrition guidelines, which includes 25-35% calories from fat, 7% or less cal from saturated fats, less than 200mg  cholesterol, less than 1.5gm of sodium, & 5 or more servings of fruits and vegetables daily;Weight Loss: Understanding of general recommendations for a balanced deficit meal plan, which promotes 1-2 lb weight loss per week and includes a negative energy balance of [phone] kcal/d;Understanding recommendations for meals to include 15-35% energy as protein, 25-35% energy from fat, 35-60% energy from carbohydrates, less than 200mg  of dietary cholesterol, 20-35 gm of total fiber daily;Understanding of distribution of calorie intake throughout the day with the consumption of 4-5 meals/snacks    Tobacco Cessation Yes    Number of packs per day does not smoke, but previously dipped. Currently using nicotine salt pouches    Intervention Assist the participant in steps to quit. Provide individualized education and counseling  about committing to Tobacco Cessation, relapse prevention, and pharmacological support that can be provided by physician.    Expected Outcomes Long Term: Complete abstinence from all tobacco products for at least 12 months from quit date.    Heart Failure Yes    Intervention Provide a combined exercise and nutrition program that is supplemented with education, support and counseling about heart failure. Directed toward relieving symptoms such as shortness of breath, decreased exercise tolerance, and extremity edema.    Expected Outcomes Improve functional capacity of life;Short term: Attendance in program 2-3 days a week with increased exercise capacity. Reported lower sodium intake. Reported increased fruit and vegetable intake. Reports medication compliance.;Short term: Daily weights obtained and reported for increase. Utilizing diuretic protocols set by physician.;Long term: Adoption of self-care skills and reduction of barriers for early signs and symptoms recognition and intervention leading to self-care maintenance.    Hypertension Yes    Intervention Provide education on lifestyle modifcations including regular physical activity/exercise, weight management, moderate sodium restriction and increased consumption of fresh fruit, vegetables, and low fat dairy, alcohol moderation, and smoking cessation.;Monitor prescription use compliance.    Expected Outcomes Short Term: Continued assessment and intervention until BP is < 140/76mm HG in hypertensive participants. < 130/24mm HG in hypertensive participants with diabetes, heart failure or chronic kidney disease.;Long Term: Maintenance of blood pressure at goal levels.    Personal Goal Other Yes    Personal Goal To feel confident exercising on his own again.    Intervention Attend CR three days per week and begin a home exercise program.    Expected Outcomes Pt will meet stated goal.              Personal Goals Discharge:  Goals and Risk Factor  Review     Row Name 11/17/22 1610 12/13/22 1251 12/29/22 1030         Core Components/Risk Factors/Patient Goals Review   Personal Goals Review Weight Management/Obesity;Hypertension;Heart Failure;Stress;Other Weight Management/Obesity;Hypertension;Heart Failure;Stress;Other Weight Management/Obesity;Hypertension;Heart Failure;Stress;Other     Review Patient was referred to CR with persistent AF. He has multiple risk factors for CAD and is participating in the program for risk modification. He plans to start the program Monday 3/25. His personal goals for the program are to lose weight, be able to return to his normal activities and feel confident exercising on his own. We will continue monitor his progress as he works towards meeting these goalsl Patient has completed 10 sessions. His current weight is 310.6 losing 0.4 lbs since last 30 day review. He is doing well in the program with consistent attendance and progressions. He was seen by behavorial health 4/8 and stated he felt he was not going to be able to finish the program due to insurance but he has not told our staff this information. His blood pressure has been at goal. His personal goals for the program are to lose weight, be able to return to his normal activities and feel confident exercising on his own. We will continue monitor his progress as he works towards meeting these goals. Patient stopped the program after completing 12 sessions due to his insurance changing. He said he hated he had to stop because the program was helping him feel stronger. His discharge blood pressure was at goal and his weight was 310.9 losingn 2.12 lbs overall in the program. No discharge documentation or walk test was completed to measure all outcome goals. Blood pressure goal met and patient stated he felt like he has met  his personal goals. He is confident to exercise on his own and plans to do so at a local gym.     Expected Outcomes Patient will do as much of  the program before 5/1 meeting both program and personal goals. Patient will do as much of the program before 5/1 meeting both program and personal goals. Patient will continue to exercise and continue to met his goals.              Exercise Goals and Review:  Exercise Goals     Row Name 11/16/22 1433 11/22/22 1317 12/20/22 1348         Exercise Goals   Increase Physical Activity Yes Yes Yes     Intervention Develop an individualized exercise prescription for aerobic and resistive training based on initial evaluation findings, risk stratification, comorbidities and participant's personal goals.;Provide advice, education, support and counseling about physical activity/exercise needs. Develop an individualized exercise prescription for aerobic and resistive training based on initial evaluation findings, risk stratification, comorbidities and participant's personal goals.;Provide advice, education, support and counseling about physical activity/exercise needs. Develop an individualized exercise prescription for aerobic and resistive training based on initial evaluation findings, risk stratification, comorbidities and participant's personal goals.;Provide advice, education, support and counseling about physical activity/exercise needs.     Expected Outcomes Short Term: Attend rehab on a regular basis to increase amount of physical activity.;Long Term: Add in home exercise to make exercise part of routine and to increase amount of physical activity.;Long Term: Exercising regularly at least 3-5 days a week. Short Term: Attend rehab on a regular basis to increase amount of physical activity.;Long Term: Add in home exercise to make exercise part of routine and to increase amount of physical activity.;Long Term: Exercising regularly at least 3-5 days a week. Short Term: Attend rehab on a regular basis to increase amount of physical activity.;Long Term: Add in home exercise to make exercise part of routine  and to increase amount of physical activity.;Long Term: Exercising regularly at least 3-5 days a week.     Increase Strength and Stamina Yes Yes Yes     Intervention Provide advice, education, support and counseling about physical activity/exercise needs.;Develop an individualized exercise prescription for aerobic and resistive training based on initial evaluation findings, risk stratification, comorbidities and participant's personal goals. Provide advice, education, support and counseling about physical activity/exercise needs.;Develop an individualized exercise prescription for aerobic and resistive training based on initial evaluation findings, risk stratification, comorbidities and participant's personal goals. Provide advice, education, support and counseling about physical activity/exercise needs.;Develop an individualized exercise prescription for aerobic and resistive training based on initial evaluation findings, risk stratification, comorbidities and participant's personal goals.     Expected Outcomes Short Term: Increase workloads from initial exercise prescription for resistance, speed, and METs.;Short Term: Perform resistance training exercises routinely during rehab and add in resistance training at home;Long Term: Improve cardiorespiratory fitness, muscular endurance and strength as measured by increased METs and functional capacity ( ) Short Term: Increase workloads from initial exercise prescription for resistance, speed, and METs.;Short Term: Perform resistance training exercises routinely during rehab and add in resistance training at home;Long Term: Improve cardiorespiratory fitness, muscular endurance and strength as measured by increased METs and functional capacity ( ) Short Term: Increase workloads from initial exercise prescription for resistance, speed, and METs.;Short Term: Perform resistance training exercises routinely during rehab and add in resistance training at home;Long  Term: Improve cardiorespiratory fitness, muscular endurance and strength as measured by increased METs and functional capacity ( )  Able to understand and use rate of perceived exertion (RPE) scale Yes Yes Yes     Intervention Provide education and explanation on how to use RPE scale Provide education and explanation on how to use RPE scale Provide education and explanation on how to use RPE scale     Expected Outcomes Short Term: Able to use RPE daily in rehab to express subjective intensity level;Long Term:  Able to use RPE to guide intensity level when exercising independently Short Term: Able to use RPE daily in rehab to express subjective intensity level;Long Term:  Able to use RPE to guide intensity level when exercising independently Short Term: Able to use RPE daily in rehab to express subjective intensity level;Long Term:  Able to use RPE to guide intensity level when exercising independently     Knowledge and understanding of Target Heart Rate Range (THRR) Yes Yes Yes     Intervention Provide education and explanation of THRR including how the numbers were predicted and where they are located for reference Provide education and explanation of THRR including how the numbers were predicted and where they are located for reference Provide education and explanation of THRR including how the numbers were predicted and where they are located for reference     Expected Outcomes Short Term: Able to state/look up THRR;Short Term: Able to use daily as guideline for intensity in rehab;Long Term: Able to use THRR to govern intensity when exercising independently Short Term: Able to state/look up THRR;Short Term: Able to use daily as guideline for intensity in rehab;Long Term: Able to use THRR to govern intensity when exercising independently Short Term: Able to state/look up THRR;Short Term: Able to use daily as guideline for intensity in rehab;Long Term: Able to use THRR to govern intensity when  exercising independently     Able to check pulse independently Yes Yes Yes     Intervention Provide education and demonstration on how to check pulse in carotid and radial arteries.;Review the importance of being able to check your own pulse for safety during independent exercise Provide education and demonstration on how to check pulse in carotid and radial arteries.;Review the importance of being able to check your own pulse for safety during independent exercise Provide education and demonstration on how to check pulse in carotid and radial arteries.;Review the importance of being able to check your own pulse for safety during independent exercise     Expected Outcomes Short Term: Able to explain why pulse checking is important during independent exercise;Long Term: Able to check pulse independently and accurately Short Term: Able to explain why pulse checking is important during independent exercise;Long Term: Able to check pulse independently and accurately Short Term: Able to explain why pulse checking is important during independent exercise;Long Term: Able to check pulse independently and accurately     Understanding of Exercise Prescription Yes Yes Yes     Intervention Provide education, explanation, and written materials on patient's individual exercise prescription Provide education, explanation, and written materials on patient's individual exercise prescription Provide education, explanation, and written materials on patient's individual exercise prescription     Expected Outcomes Short Term: Able to explain program exercise prescription;Long Term: Able to explain home exercise prescription to exercise independently Short Term: Able to explain program exercise prescription;Long Term: Able to explain home exercise prescription to exercise independently Short Term: Able to explain program exercise prescription;Long Term: Able to explain home exercise prescription to exercise independently  Exercise Goals Re-Evaluation:  Exercise Goals Re-Evaluation     Row Name 11/22/22 1318 12/20/22 1348           Exercise Goal Re-Evaluation   Exercise Goals Review Increase Physical Activity;Increase Strength and Stamina;Able to understand and use rate of perceived exertion (RPE) scale;Knowledge and understanding of Target Heart Rate Range (THRR);Able to check pulse independently;Understanding of Exercise Prescription Increase Physical Activity;Increase Strength and Stamina;Able to understand and use rate of perceived exertion (RPE) scale;Knowledge and understanding of Target Heart Rate Range (THRR);Able to check pulse independently;Understanding of Exercise Prescription      Comments Pt has completed 3 sessions of cardiac rehab. He is very motivated to be in class. He is already starting to increase his speed on the treadmill each session. He is ready to push himself during class and is currently exercising at 2.76 METs on the treadmill. Will continue to monitor and progress as able. Pt has completed 12 sessions of cardiac rehab. He continue to be very motivated to increase his endurance. He is currently exercising at 3.14 METs on the treadmill. Will continue to monitor and progress as able.      Expected Outcomes Through exercise at rehab and home, the patient will meet their stated goals Through exercise at rehab and home, the patient will meet their stated goals               Nutrition & Weight - Outcomes:  Pre Biometrics - 11/16/22 1434       Pre Biometrics   Height 6' (1.829 m)    Weight 141 kg    Waist Circumference 52 inches    Hip Circumference 50 inches    Waist to Hip Ratio 1.04 %    BMI (Calculated) 42.15    Triceps Skinfold 52 mm    % Body Fat 42.1 %    Grip Strength 44.5 kg    Flexibility 0 in    Single Leg Stand 25.56 seconds              Nutrition:  Nutrition Therapy & Goals - 11/17/22 0703       Personal Nutrition Goals   Comments Patient  scored 30 on his diet assessment. We have educational sessions on heart healthy nutrition with handouts and offer referral to RD if patient is interested.      Intervention Plan   Intervention Nutrition handout(s) given to patient.    Expected Outcomes Short Term Goal: Understand basic principles of dietary content, such as calories, fat, sodium, cholesterol and nutrients.             Nutrition Discharge:  Nutrition Assessments - 11/16/22 1253       MEDFICTS Scores   Pre Score 30             Education Questionnaire Score:  Knowledge Questionnaire Score - 11/16/22 1253       Knowledge Questionnaire Score   Pre Score 25/28             Goals reviewed with patient. Patient stopped the program after completing 12 sessions due to his insurance changing. He said he hated he had to stop because the program was helping him feel stronger. His discharge blood pressure was at goal and his weight was 310.9 losing 2.12 lbs overall in the program. No discharge documentation or walk test was completed to measure all outcome goals. Blood pressure goal met and patient stated he felt like he has met his personal goals. He is confident  to exercise on his own and plans to do so at a local gym.

## 2022-12-31 ENCOUNTER — Encounter (HOSPITAL_COMMUNITY): Admission: RE | Admit: 2022-12-31 | Payer: 59 | Source: Ambulatory Visit

## 2023-01-03 ENCOUNTER — Encounter (HOSPITAL_COMMUNITY): Payer: 59

## 2023-01-05 ENCOUNTER — Encounter (HOSPITAL_COMMUNITY): Payer: 59

## 2023-01-07 ENCOUNTER — Encounter (HOSPITAL_COMMUNITY): Payer: 59

## 2023-01-09 ENCOUNTER — Other Ambulatory Visit: Payer: Self-pay | Admitting: Physician Assistant

## 2023-01-10 ENCOUNTER — Encounter (HOSPITAL_COMMUNITY): Payer: 59

## 2023-01-11 ENCOUNTER — Ambulatory Visit (INDEPENDENT_AMBULATORY_CARE_PROVIDER_SITE_OTHER): Payer: Medicare Other | Admitting: Clinical

## 2023-01-11 DIAGNOSIS — F419 Anxiety disorder, unspecified: Secondary | ICD-10-CM | POA: Diagnosis not present

## 2023-01-11 DIAGNOSIS — F331 Major depressive disorder, recurrent, moderate: Secondary | ICD-10-CM | POA: Diagnosis not present

## 2023-01-11 NOTE — Progress Notes (Signed)
Virtual Visit via Video Note   I connected with Nathaniel [name] on 01/11/23 at  2:00 PM EST by a video enabled telemedicine application and verified that I am speaking with the correct person using two identifiers.   Location: Patient: Home Provider: Office   I discussed the limitations of evaluation and management by telemedicine and the availability of in person appointments. The patient expressed understanding and agreed to proceed.     THERAPIST PROGRESS NOTE   Session Time: 2:00 PM- 2:30 PM   Participation Level: Active   Behavioral Response: CasualAlertDepressed   Type of Therapy: Individual Therapy   Treatment Goals addressed: Coping   Interventions: CBT, Motivational Interviewing, Strength-based and Supportive   Summary: Nathaniel [name] is a 32 y.o. male who presents with Depression with Anxiety. The OPT therapist worked with the patient for his OPT treatment .The OPT therapist utilized Motivational Interviewing to assist in creating therapeutic repore. The OPT therapist gained feedback about the patients triggers and symptoms over the past few week.The patient spoke about his ongoing work with his cardiac specialist with a upcoming appointment within the next few weeks as a post after his recent surgery as a follow up. The OPT therapist utilized Cognitive Behavioral Therapy through cognitive restructuring as well as worked on coping strategies to assist in management of his mental health symptoms. The OPT therapist continued to place emphasis on the patient and challenging ANTS (automatic negative thoughts) to improve his mental health. The OPT therapist worked with the patient on balancing stress/ with coping. The patient worked with the OPT therapist on keeping good boundaries in the different sections of his life and working on his reactive behavior. The patient spoke about his focus in getting back into exercise post recently doing cardiac rehab, and spoke about his plan to  use a at home treadmill. The patient spoke about the impact of getting approved for his disability and this being a big help to take stress off of fiances.   Suicidal/Homicidal: Nowithout intent/plan   Therapist Response: The OPT therapist worked with the patient for the patients scheduled session. The patient was engaged in his session and gave feedback in relation to triggers, symptoms, and behavior responses over the past few weeks. The OPT therapist worked utilizing an in Psychologist, forensic Therapy exercise. The patient noted, " I feel like I am getting back to myself and I am keeping my foot on the gas". The patient spoke about being mindful of work life balance and doing better at working on different sections of his life. The The OPT therapist worked with the patient to be mindful of self care and reviewed with the patient basic health areas including sleep cycle, eating habits,  physical exercise, and hygiene. The OPT therapist continued to review stress/ coping balance and management of different sections of his life. The patient spoke about planning to go visit his wife's sister down at [facility]  around [place] Day weekend and staying at the beach at a hotel.The OPT therapist will continue treatment work with the patient in his next session.      Plan: Follow up in 2/3 weeks   Diagnosis:      Axis I: Recurrent, Moderate Depressive Disorder with Anxiety                        Axis II: No diagnosis   Collaboration of Care: No additional collaboration for this session.   Patient/Guardian was advised Release of  Information must be obtained prior to any record release in order to collaborate their care with an outside provider. Patient/Guardian was advised if they have not already done so to contact the registration department to sign all necessary forms in order for [place] to release information regarding their care.    Consent: Patient/Guardian gives verbal consent for treatment and  assignment of benefits for services provided during this visit. Patient/Guardian expressed understanding and agreed to proceed.    I discussed the assessment and treatment plan with the patient. The patient was provided an opportunity to ask questions and all were answered. The patient agreed with the plan and demonstrated an understanding of the instructions.   The patient was advised to call back or seek an in-person evaluation if the symptoms worsen or if the condition fails to improve as anticipated.   I provided 30 minutes of non-face-to-face time during this encounter.     Suzan Garibaldi, LCSW   01/11/2023

## 2023-01-12 ENCOUNTER — Encounter (HOSPITAL_COMMUNITY): Payer: 59

## 2023-01-14 ENCOUNTER — Encounter (HOSPITAL_COMMUNITY): Payer: 59

## 2023-01-16 NOTE — Progress Notes (Unsigned)
Cardiology Office Note Date:  01/16/2023  Patient ID:  Nathaniel [name], Nathaniel [name] 03-24-1991, MRN [id number] PCP:  Toma Deiters, MD  Electrophysiologist: Dr. Lalla Brothers    Chief Complaint: Tikosyn visit  History of Present Illness: Nathaniel [name] is a 32 y.o. male with history of pancreatitis, obesity, tachy-mediated CM, chronic CHF, AFib  He saw Dr. Lalla Brothers 09/22/22, QTc stable, c/w symptomatic AFib with discussion of possible hybrid/convergent procedure. Advised close f/u with his PMD for BP.  Convergent procedure 10/28/22  Saw the AFib clinic 10/28/22, doing well post convergent procedure, stable EKG, no changes were made.  TODAY He is accompanied by his wife. Feels like a new person, has not had Afib since his procedure. Feels better, energy is improved, is quite happy. He has noticed that when out in the yard, particularly hot days he gets aware that his HR is fats, 100's-120, has taken watch recordings, they say is not AFib but is fast.  He will sit and rest or go inside and cool off, settles quickly No CP, near syncope or syncope. Good medication compliance No bleeding or signs of bleeding    AFib Hx PVI/CTI ablation 12/09/2016 (Dr. Johney Frame) PVI/CTI ablation 11/09/2019 (Dr. Johney Frame) Flecainide failed to maintain SR Tikosyn started Nov 2023 10/18/22: Convergent procedure/LAA clipping, Dr. Delia Chimes   Past Medical History:  Diagnosis Date   Acute pancreatitis without infection or necrosis 03/05/2016   Anginal pain (HCC)    Happens around moments of A.Fib   Anxiety    Arthritis    Back   Atrial fibrillation (HCC)    Atrial flutter (HCC) 01/17/2016   Admx 5/17 with AFlutter with RVR in setting of pancreatitis // CHADS2-VASc=1 (CHF) // Anticoag not started // Amiodarone >> NSR    Cardiomyopathy (HCC)    CHF (congestive heart failure) (HCC)    Chronic lower back pain    Chronic systolic CHF (congestive heart failure) (HCC)    Echo 01/17/16 - EF 35-40%, diffuse HK, mild  LAE    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HCC) 02/03/2016   a. EF 35-40% in setting of AFlutter with RVR in 5/17 // probably tachycardia mediated // needs FU echo 2-3 mos after NSR restored // b. Limited Echo 7/17: EF 45-50%, diff HK, mild LAE   Depression    "last 2-3 months; never treated for it" (12/09/2016)   Dysrhythmia    A. Fib   Fatty liver 12/2015   per CT   Gastroenteritis 06/05/2017   GERD (gastroesophageal reflux disease)    Headache    "monthly" (12/09/2016)   Obesity    BMI 45 12/2015   Pancreatic pseudocyst    Pancreatitis 2012   recurrent acute. Admissions to [facility], 12/2015 admissin to [facility], ct then shows pseudocyst.    Pancreatitis 2012 X 2; 2017 X 2   Pancreatitis 03/05/2016   Paroxysmal atrial fibrillation (HCC)    Typical atrial flutter (HCC) 06/06/2017    Past Surgical History:  Procedure Laterality Date   ATRIAL FIBRILLATION ABLATION N/A 12/09/2016   Procedure: Atrial Fibrillation Ablation;  Surgeon: Hillis Range, MD;  Location[facility] INVASIVE CV LAB;  Service: Cardiovascular;  Laterality: N/A;   ATRIAL FIBRILLATION ABLATION N/A 11/09/2019   Procedure: ATRIAL FIBRILLATION ABLATION;  Surgeon: Hillis Range, MD;  Location: [facility] INVASIVE CV LAB;  Service: Cardiovascular;  Laterality: N/A;   CHOLECYSTECTOMY  2022   CLIPPING OF ATRIAL APPENDAGE N/A 10/18/2022   Procedure: CLIPPING OF LEFT ATRIAL APPENDAGE;  Surgeon: Lyn Hollingshead, MD;  Location: [facility] OR;  Service: Open Heart Surgery;  Laterality: N/A;   HAND SURGERY Right ~ 2008   "had pins in it; pulled them"   TEE WITHOUT CARDIOVERSION N/A 12/08/2016   Procedure: TRANSESOPHAGEAL ECHOCARDIOGRAM (TEE);  Surgeon: Jake Bathe, MD;  Location: [facility] ENDOSCOPY;  Service: Cardiovascular;  Laterality: N/A;   TEE WITHOUT CARDIOVERSION N/A 10/18/2022   Procedure: TRANSESOPHAGEAL ECHOCARDIOGRAM;  Surgeon: Lyn Hollingshead, MD;  Location: [facility] OR;  Service: Open Heart Surgery;  Laterality: N/A;   TONSILLECTOMY AND ADENOIDECTOMY     "in  my teens"    Current Outpatient Medications  Medication Sig Dispense Refill   acetaminophen (TYLENOL) 650 MG CR tablet Take 650 mg by mouth every 8 (eight) hours as needed for pain.     amoxicillin (AMOXIL) 875 MG tablet Take 875 mg by mouth 2 (two) times daily.     clonazePAM (KLONOPIN) 0.5 MG tablet Take 0.5 tablets (0.25 mg total) by mouth 2 (two) times daily. 30 tablet 0   colchicine 0.6 MG tablet Take 1 tablet (0.6 mg total) by mouth daily for 7 days. 7 tablet 0   dofetilide (TIKOSYN) 500 MCG capsule Take 1 capsule (500 mcg total) by mouth 2 (two) times daily. 30 capsule 2   ELIQUIS 5 MG TABS tablet TAKE 1 TABLET BY MOUTH TWICE DAILY 60 tablet 5   methylPREDNISolone (MEDROL DOSEPAK) 4 MG TBPK tablet Use as directed on pack. 1 each 0   metoprolol succinate (TOPROL-XL) 50 MG 24 hr tablet Take 1 tablet (50 mg total) by mouth daily at 8 pm. Take with or immediately following a meal. 30 tablet 2   Multiple Vitamin (ONE-A-DAY MENS PO) Take 1 tablet by mouth every morning.     nicotine (NICODERM CQ - DOSED IN MG/24 HOURS) 21 mg/24hr patch Place 1 patch (21 mg total) onto the skin daily. (Patient taking differently: Place 21 mg onto the skin daily as needed (Stop smoking).) 28 patch 0   pantoprazole (PROTONIX) 40 MG tablet TAKE 1 TABLET(40 MG) BY MOUTH DAILY 90 tablet 3   potassium chloride (KLOR-CON) 10 MEQ tablet Take 1 tablet (10 mEq total) by mouth daily. 90 tablet 3   sertraline (ZOLOFT) 50 MG tablet Take 1 tablet (50 mg total) by mouth daily. 30 tablet 2   triamcinolone cream (KENALOG) 0.1 % Apply 1 application topically daily as needed (for irritation).      No current facility-administered medications for this visit.    Allergies:   Morphine   Social History:  The patient  reports that he quit smoking about 8 years ago. His smoking use included cigarettes. He smoked an average of .5 packs per day. His smokeless tobacco use includes snuff. He reports that he does not currently use  alcohol. He reports that he does not currently use drugs after having used the following drugs: Marijuana.   Family History:  The patient's family history includes Cancer in his maternal grandfather and maternal grandmother; Cirrhosis in his maternal grandfather; Diabetes in his mother; Gallbladder disease in his mother; Hypertension in his father; Stroke in his paternal grandmother; Supraventricular tachycardia in his brother.  ROS:  Please see the history of present illness.    All other systems are reviewed and otherwise negative.   PHYSICAL EXAM:  VS:  There were no vitals taken for this visit. BMI: There is no height or weight on file to calculate BMI. Well nourished, well developed, in no acute distress HEENT: normocephalic, atraumatic Neck: no JVD, carotid  bruits or masses Cardiac:  RRR; no significant murmurs, no rubs, or gallops Lungs:  CTA b/l, no wheezing, rhonchi or rales Abd: soft, nontender MS: no deformity or atrophy Ext: no edema Skin: warm and dry, no rash Neuro:  No gross deficits appreciated Psych: euthymic mood, full affect   EKG:  Done today and reviewed by myself shows  SR 85bpm, large Q wave/T in III only   08/11/22: TTE 1. Left ventricular ejection fraction, by estimation, is 50 to 55%. The  left ventricle has low normal function. The left ventricle has no regional  wall motion abnormalities. The left ventricular internal cavity size was  mildly dilated. Left ventricular  diastolic parameters were normal.   2. Right ventricular systolic function is normal. The right ventricular  size is normal. Tricuspid regurgitation signal is inadequate for assessing  PA pressure.   3. Left atrial size was upper normal.   4. Right atrial size was mildly dilated.   5. The mitral valve is grossly normal. Trivial mitral valve  regurgitation.   6. The aortic valve is tricuspid. Aortic valve regurgitation is not  visualized.   7. The inferior vena cava is normal in size  with <50% respiratory  variability, suggesting right atrial pressure of 8 mmHg.   Comparison(s): Prior images reviewed side by side. LVEF low normal range  at 50-55%. Upper normal left atrial chamber size and mildly dilated right  atirum.      11/09/19: EPS/ablation CONCLUSIONS: 1. Sinus rhythm upon presentation.   2. Intracardiac echo reveals a moderate sized left atrium with four separate pulmonary veins without evidence of pulmonary vein stenosis.  There was a common ostium to the left PVs.  The right superior PV was large. 3. Return of electric conduction within the right superior pulmonary vein at baseline, more prominent after adenosine infusion.  The right inferior, left superior and left inferior PVs were quiescent from a prior ablation and did not require additional ablation today. 4. Successful electrical reisolation of the right superior pulmonary vein with radiofrequency current.    5. Right atrial flutter induced with isuprel infusion and catheter manipulation which spontaneously terminated and was not sufficient in duration for mapping. 6. Return of conduction along the previously ablated Cavo-tricuspid isthmus 7. Ablation was performed along the CTI today with complete bidirectional isthmus block achieved.  8. No inducible sustained arrhythmias following ablation both on and off of Isuprel 9. No early apparent complications.     01/25/2019: TTE 1. The left ventricle has a visually estimated ejection fraction of 55%.  The cavity size was normal. Left ventricular diastolic parameters were  normal.   2. The right ventricle has normal systolic function. The cavity was  normal. There is no increase in right ventricular wall thickness.   3. The mitral valve is grossly normal. There is mild mitral annular  calcification present.   4. The tricuspid valve is grossly normal.   5. The aortic valve is tricuspid.   6. The aortic root is normal in size and structure.    11/08/21  EPS/Ablation (re-do PVI, + CTI) CONCLUSIONS: 1. Sinus rhythm upon presentation.   2. Intracardiac echo reveals a moderate sized left atrium with four separate pulmonary veins without evidence of pulmonary vein stenosis.  There was a common ostium to the left PVs.  The right superior PV was large. 3. Return of electric conduction within the right superior pulmonary vein at baseline, more prominent after adenosine infusion.  The right inferior, left superior and  left inferior PVs were quiescent from a prior ablation and did not require additional ablation today. 4. Successful electrical reisolation of the right superior pulmonary vein with radiofrequency current.    5. Right atrial flutter induced with isuprel infusion and catheter manipulation which spontaneously terminated and was not sufficient in duration for mapping. 6. Return of conduction along the previously ablated Cavo-tricuspid isthmus 7. Ablation was performed along the CTI today with complete bidirectional isthmus block achieved.  8. No inducible sustained arrhythmias following ablation both on and off of Isuprel 9. No early apparent complications.   12/09/2016: EPS/Ablation CONCLUSIONS: 1. Sinus rhythm upon presentation.   2. Intracardiac echo reveals a short common ostium to the left superior and inferior pulmonary veins.  These were moderate in size.  The right superior pulmonary vein was very large in sinus and had two large branches.  The right inferior pulmonary vein was small.  3. Successful electrical isolation and anatomical encircling of all four pulmonary veins with radiofrequency current.    4. Cavo-tricuspid isthmus ablation was performed with complete bidirectional isthmus block achieved.  5. No inducible arrhythmias following ablation both on and off of Isuprel 6. No early apparent complications.    Recent Labs: 10/20/2022: ALT 25; Hemoglobin 13.1; Platelets 299 10/28/2022: BUN 11; Creatinine, Ser 0.75; Magnesium 2.1;  Potassium 3.9; Sodium 139  No results found for requested labs within last 365 days.   CrCl cannot be calculated (Patient's most recent lab result is older than the maximum 21 days allowed.).   Wt Readings from Last 3 Encounters:  12/20/22 (!) 307 lb 12.2 oz (139.6 kg)  12/06/22 (!) 309 lb 15.5 oz (140.6 kg)  11/22/22 (!) 306 lb 7 oz (139 kg)     Other studies reviewed: Additional studies/records reviewed today include: summarized above  ASSESSMENT AND PLAN:  Persistent AFib CHA2DS2Vasc is one, on Eliquis, appropriately dosed Tikosyn w/stable QTc Med list looks OK, sertraline is chronic Labs today  He has an awareness of his heart beat/faster sounds like only when out side physically active It is anxiety provoking for him, he is scared of having more Afib One tracing I review is ST, not AFib Discussed physiological HR rise would be expected He infrequently will wake up and feel like his HR is fast, suspects a dream, he reports being tested for apnea 6-7 years ago was negative   Will have him wear a 2 week monitor to evaluate his observations   DCM Tachy mediated with recovery of his LVEF by echo 2023 No symptoms or exam findings of volume OL  3. Secondary hypercoagulable state     Disposition: F/u with otherwise in 3 months, sooner if needed    Current medicines are reviewed at length with the patient today.  The patient did not have any concerns regarding medicines.  Norma Fredrickson, PA-C 01/16/2023 10:25 AM     [facility] [address] [place] [address] [phone] (office)  [phone] (fax)

## 2023-01-17 ENCOUNTER — Encounter (HOSPITAL_COMMUNITY): Payer: 59

## 2023-01-17 ENCOUNTER — Ambulatory Visit (INDEPENDENT_AMBULATORY_CARE_PROVIDER_SITE_OTHER): Payer: Medicare Other

## 2023-01-17 ENCOUNTER — Ambulatory Visit: Payer: Medicare Other | Attending: Physician Assistant | Admitting: Physician Assistant

## 2023-01-17 ENCOUNTER — Encounter: Payer: Self-pay | Admitting: Physician Assistant

## 2023-01-17 VITALS — BP 116/84 | HR 85 | Ht 72.0 in | Wt 308.8 lb

## 2023-01-17 DIAGNOSIS — I4819 Other persistent atrial fibrillation: Secondary | ICD-10-CM | POA: Diagnosis present

## 2023-01-17 DIAGNOSIS — Z79899 Other long term (current) drug therapy: Secondary | ICD-10-CM | POA: Insufficient documentation

## 2023-01-17 DIAGNOSIS — I420 Dilated cardiomyopathy: Secondary | ICD-10-CM | POA: Insufficient documentation

## 2023-01-17 DIAGNOSIS — R002 Palpitations: Secondary | ICD-10-CM | POA: Diagnosis present

## 2023-01-17 DIAGNOSIS — Z5181 Encounter for therapeutic drug level monitoring: Secondary | ICD-10-CM | POA: Insufficient documentation

## 2023-01-17 DIAGNOSIS — D6869 Other thrombophilia: Secondary | ICD-10-CM | POA: Insufficient documentation

## 2023-01-17 NOTE — Patient Instructions (Addendum)
Medication Instructions:   Your physician recommends that you continue on your current medications as directed. Please refer to the Current Medication list given to you today.  *If you need a refill on your cardiac medications before your next appointment, please call your pharmacy*   Lab Work:  BMET AND MAG TODAY   If you have labs (blood work) drawn today and your tests are completely normal, you will receive your results only by: MyChart Message (if you have MyChart) OR A paper copy in the mail If you have any lab test that is abnormal or we need to change your treatment, we will call you to review the results.   Testing/Procedures: .Your physician has recommended that you wear an event monitor. Event monitors are medical devices that record the heart's electrical activity. Doctors most often [place] these monitors to diagnose arrhythmias. Arrhythmias are problems with the speed or rhythm of the heartbeat. The monitor is a small, portable device. You can wear one while you do your normal daily activities. This is usually used to diagnose what is causing palpitations/syncope (passing out).     Follow-Up: At [facility], you and your health needs are our priority.  As part of our continuing mission to provide you with exceptional heart care, we have created designated Provider Care Teams.  These Care Teams include your primary Cardiologist (physician) and Advanced Practice Providers (APPs -  Physician Assistants and Nurse Practitioners) who all work together to provide you with the care you need, when you need it.  We recommend signing up for the patient portal called "MyChart".  Sign up information is provided on this After Visit Summary.  MyChart is used to connect with patients for Virtual Visits (Telemedicine).  Patients are able to view lab/test results, encounter notes, upcoming appointments, etc.  Non-urgent messages can be sent to your provider as well.   To learn more about  what you can do with MyChart, go to [email or web address].    Your next appointment:   3 month(s)  Provider:   You may see Lanier Prude, MD or one of the following Advanced Practice Providers on your designated Care Team:   Francis Dowse, [place]  Other Instructions  ZIO XT- Long Term Monitor Instructions  Your physician has requested you wear a ZIO patch monitor for 14 days.  This is a single patch monitor. [employer] supplies one patch monitor per enrollment. Additional stickers are not available. Please do not apply patch if you will be having a Nuclear Stress Test,  Echocardiogram, Cardiac CT, MRI, or Chest Xray during the period you would be wearing the  monitor. The patch cannot be worn during these tests. You cannot remove and re-apply the  ZIO XT patch monitor.  Your ZIO patch monitor will be mailed 3 day USPS to your address on file. It may take 3-5 days  to receive your monitor after you have been enrolled.  Once you have received your monitor, please review the enclosed instructions. Your monitor  has already been registered assigning a specific monitor serial # to you.  Billing and Patient Assistance Program Information  We have supplied [employer] with any of your insurance information on file for billing purposes. [employer] offers a sliding scale Patient Assistance Program for patients that do not have  insurance, or whose insurance does not completely cover the cost of the ZIO monitor.  You must apply for the Patient Assistance Program to qualify for this discounted rate.  To apply, please call  [employer] at [phone], select option 4, select option 2, ask to apply for  Patient Assistance Program. Meredeth Ide will ask your household income, and how many people  are in your household. They will quote your out-of-pocket cost based on that information.  [employer] will also be able to set up a 18-month, interest-free payment plan if needed.  Applying the monitor   Shave hair from  upper left chest.  Hold abrader disc by orange tab. Rub abrader in 40 strokes over the upper left chest as  indicated in your monitor instructions.  Clean area with 4 enclosed alcohol pads. Let dry.  Apply patch as indicated in monitor instructions. Patch will be placed under collarbone on left  side of chest with arrow pointing upward.  Rub patch adhesive wings for 2 minutes. Remove white label marked "1". Remove the white  label marked "2". Rub patch adhesive wings for 2 additional minutes.  While looking in a mirror, press and release button in center of patch. A small green light will  flash 3-4 times. This will be your only indicator that the monitor has been turned on.  Do not shower for the first 24 hours. You may shower after the first 24 hours.  Press the button if you feel a symptom. You will hear a small click. Record Date, Time and  Symptom in the Patient Logbook.  When you are ready to remove the patch, follow instructions on the last 2 pages of Patient  Logbook. Stick patch monitor onto the last page of Patient Logbook.  Place Patient Logbook in the blue and white box. Use locking tab on box and tape box closed  securely. The blue and white box has prepaid postage on it. Please place it in the mailbox as  soon as possible. Your physician should have your test results approximately 7 days after the  monitor has been mailed back to [facility].  Call [facility] Customer Care at [phone] if you have questions regarding  your ZIO XT patch monitor. Call them immediately if you see an orange light blinking on your  monitor.  If your monitor falls off in less than 4 days, contact our Monitor department at [phone].  If your monitor becomes loose or falls off after 4 days call [employer] at [phone] for  suggestions on securing your monitor

## 2023-01-17 NOTE — Progress Notes (Unsigned)
Enrolled for [employer] to mail a ZIO XT long term holter monitor to the patients address on file.   Dr. Lambert to read. 

## 2023-01-18 LAB — BASIC METABOLIC PANEL
BUN/Creatinine Ratio: 15 (ref 9–20)
BUN: 12 mg/dL (ref 6–20)
CO2: 21 mmol/L (ref 20–29)
Calcium: 9 mg/dL (ref 8.7–10.2)
Chloride: 107 mmol/L — ABNORMAL HIGH (ref 96–106)
Creatinine, Ser: 0.8 mg/dL (ref 0.76–1.27)
Glucose: 95 mg/dL (ref 70–99)
Potassium: 4.3 mmol/L (ref 3.5–5.2)
Sodium: 142 mmol/L (ref 134–144)
eGFR: 121 mL/min/{1.73_m2} (ref >59)

## 2023-01-18 LAB — MAGNESIUM: Magnesium: 2.2 mg/dL (ref 1.6–2.3)

## 2023-01-19 ENCOUNTER — Encounter (HOSPITAL_COMMUNITY): Payer: 59

## 2023-01-19 ENCOUNTER — Other Ambulatory Visit: Payer: Self-pay | Admitting: Physician Assistant

## 2023-01-21 ENCOUNTER — Encounter (HOSPITAL_COMMUNITY): Payer: 59

## 2023-01-24 ENCOUNTER — Encounter (HOSPITAL_COMMUNITY): Payer: 59

## 2023-01-25 DIAGNOSIS — R002 Palpitations: Secondary | ICD-10-CM | POA: Diagnosis not present

## 2023-01-26 ENCOUNTER — Telehealth: Payer: Self-pay | Admitting: Cardiology

## 2023-01-26 ENCOUNTER — Encounter (HOSPITAL_COMMUNITY): Payer: 59

## 2023-01-26 NOTE — Telephone Encounter (Signed)
*  STAT* If patient is at the pharmacy, call can be transferred to refill team.   1. Which medications need to be refilled? (please list name of each medication and dose if known)  metoprolol succinate (TOPROL-XL) 50 MG 24 hr tablet  2. Which pharmacy/location (including street and city if local pharmacy) is medication to be sent to? [facility] - [place], [place] - [address]  3. Do they need a 30 day or 90 day supply?  90 day supply  Belenda Cruise with [facility] states the patient is completely out of medication.

## 2023-01-27 ENCOUNTER — Other Ambulatory Visit: Payer: Self-pay

## 2023-01-27 MED ORDER — METOPROLOL SUCCINATE ER 50 MG PO TB24: 50.0000 mg | ORAL_TABLET | Freq: Every day | ORAL | 3 refills | Status: DC

## 2023-01-27 NOTE — Telephone Encounter (Signed)
Pt's medication was sent to pt's pharmacy as requested. Confirmation received.  °

## 2023-01-28 ENCOUNTER — Encounter (HOSPITAL_COMMUNITY): Payer: 59

## 2023-01-31 ENCOUNTER — Encounter (HOSPITAL_COMMUNITY): Payer: 59

## 2023-02-02 ENCOUNTER — Encounter (HOSPITAL_COMMUNITY): Payer: 59

## 2023-02-02 MED ORDER — DOFETILIDE 500 MCG PO CAPS: 500.0000 ug | ORAL_CAPSULE | Freq: Two times a day (BID) | ORAL | 3 refills | Status: DC

## 2023-02-04 ENCOUNTER — Encounter (HOSPITAL_COMMUNITY): Payer: 59

## 2023-02-07 ENCOUNTER — Encounter (HOSPITAL_COMMUNITY): Payer: 59

## 2023-02-08 ENCOUNTER — Other Ambulatory Visit: Payer: Self-pay | Admitting: *Deleted

## 2023-02-08 ENCOUNTER — Other Ambulatory Visit (HOSPITAL_COMMUNITY): Payer: Self-pay | Admitting: Psychiatry

## 2023-02-08 DIAGNOSIS — I480 Paroxysmal atrial fibrillation: Secondary | ICD-10-CM

## 2023-02-08 DIAGNOSIS — F410 Panic disorder [episodic paroxysmal anxiety] without agoraphobia: Secondary | ICD-10-CM

## 2023-02-08 MED ORDER — APIXABAN 5 MG PO TABS
5.0000 mg | ORAL_TABLET | Freq: Two times a day (BID) | ORAL | 5 refills | Status: DC
Start: 2023-02-08 — End: 2023-08-23

## 2023-02-08 NOTE — Telephone Encounter (Signed)
Prescription refill request for Eliquis received. Indication: AF Last office visit: 01/17/23  R Keitha Butte PA-C Scr: 0.80 on 01/17/23  Epic Age: 32 Weight: 140.1kg  Based on above findings Eliquis 5mg  twice daily is the appropriate dose.  Refill approved.

## 2023-02-09 ENCOUNTER — Encounter (HOSPITAL_COMMUNITY): Payer: 59

## 2023-02-23 ENCOUNTER — Other Ambulatory Visit (HOSPITAL_COMMUNITY): Payer: Self-pay | Admitting: Psychiatry

## 2023-02-23 DIAGNOSIS — F410 Panic disorder [episodic paroxysmal anxiety] without agoraphobia: Secondary | ICD-10-CM

## 2023-03-01 ENCOUNTER — Encounter: Payer: Self-pay | Admitting: Orthopedic Surgery

## 2023-03-01 ENCOUNTER — Ambulatory Visit (INDEPENDENT_AMBULATORY_CARE_PROVIDER_SITE_OTHER): Payer: Medicare Other | Admitting: Orthopedic Surgery

## 2023-03-01 ENCOUNTER — Other Ambulatory Visit (INDEPENDENT_AMBULATORY_CARE_PROVIDER_SITE_OTHER): Payer: Medicare Other

## 2023-03-01 VITALS — BP 122/83 | HR 94 | Ht 72.0 in | Wt 313.0 lb

## 2023-03-01 DIAGNOSIS — M25511 Pain in right shoulder: Secondary | ICD-10-CM

## 2023-03-01 DIAGNOSIS — G8929 Other chronic pain: Secondary | ICD-10-CM | POA: Diagnosis not present

## 2023-03-01 DIAGNOSIS — M7521 Bicipital tendinitis, right shoulder: Secondary | ICD-10-CM | POA: Diagnosis not present

## 2023-03-01 NOTE — Addendum Note (Signed)
Addended by: Baird Kay on: 03/01/2023 10:58 AM   Modules accepted: Orders

## 2023-03-01 NOTE — Progress Notes (Signed)
New Patient Visit  Assessment: Nathaniel [name] is a 32 y.o. male with the following: 1. Biceps tendinitis of right upper extremity  Plan: Nathaniel [name] has pain in the anterior aspect of the right shoulder.  This has been going on intermittently for over 10 years.  More recently, over the past 6 months, it has continued to bother him.  He notes some snapping and popping sensations.  He cannot take NSAIDs.  He has been taking Tylenol, and working with Dr. Ladona Ridgel.  Pain is primarily in the anterior aspect the shoulder.  Provocative testing of the long head of the biceps tendon recreates his pain.  Otherwise, he has good range of motion and strength in the right shoulder.  At this point, I think he has some biceps tendinitis, with possible subluxation of the tendon out of its groove.  We will initiate physical therapy, and he can continue with his current medications, with consideration for some topical treatments.  In the future, we can also consider an injection of the biceps tendon sheath.  He will follow-up as needed.  Follow-up: Return if symptoms worsen or fail to improve.  Subjective:  Chief Complaint  Patient presents with   Shoulder Pain    R shoulder pain that started initially after an injury in '12, pt states this round of pain started in Jan '24 getting worse. Pt does see a chiropractor due to a history of back pain suggested he come see you for his shoulder at this point.     History of Present Illness: Nathaniel [name] is a 32 y.o. male who presents for evaluation of right shoulder pain.  He is right-hand dominant.  He states that shoulder she started in 2012.  He was moving some heavy objects at work, when he started to have pain in the anterior shoulder.  He would have intermittent pain since.  However, over the past 6 months, his pain has remained constant.  It is in the anterior shoulder.  He does note a popping sensation.  He has been seeing Dr. Ladona Ridgel, and received  chiropractic care.  This has not improved the symptoms he is having in the anterior aspect of the right shoulder.  He cannot take NSAIDs.  He has been taking Tylenol as needed.  He will use Biofreeze occasionally.  Pain can get worse at night.  Can also be worse in the morning.  He has not worked with physical therapy.  No prior injections.   Review of Systems: No fevers or chills No numbness or tingling No chest pain No shortness of breath No bowel or bladder dysfunction No GI distress No headaches   Medical History:  Past Medical History:  Diagnosis Date   Acute pancreatitis without infection or necrosis 03/05/2016   Anginal pain (HCC)    Happens around moments of A.Fib   Anxiety    Arthritis    Back   Atrial fibrillation (HCC)    Atrial flutter (HCC) 01/17/2016   Admx 5/17 with AFlutter with RVR in setting of pancreatitis // CHADS2-VASc=1 (CHF) // Anticoag not started // Amiodarone >> NSR    Cardiomyopathy (HCC)    CHF (congestive heart failure) (HCC)    Chronic lower back pain    Chronic systolic CHF (congestive heart failure) (HCC)    Echo 01/17/16 - EF 35-40%, diffuse HK, mild LAE    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HCC) 02/03/2016   a. EF 35-40% in setting of AFlutter with RVR in 5/17 // probably tachycardia  mediated // needs FU echo 2-3 mos after NSR restored // b. Limited Echo 7/17: EF 45-50%, diff HK, mild LAE   Depression    "last 2-3 months; never treated for it" (12/09/2016)   Dysrhythmia    A. Fib   Fatty liver 12/2015   per CT   Gastroenteritis 06/05/2017   GERD (gastroesophageal reflux disease)    Headache    "monthly" (12/09/2016)   Obesity    BMI 45 12/2015   Pancreatic pseudocyst    Pancreatitis 2012   recurrent acute. Admissions to [facility], 12/2015 admissin to [facility] hospital, ct then shows pseudocyst.    Pancreatitis 2012 X 2; 2017 X 2   Pancreatitis 03/05/2016   Paroxysmal atrial fibrillation (HCC)    Typical atrial flutter (HCC) 06/06/2017    Past Surgical  History:  Procedure Laterality Date   ATRIAL FIBRILLATION ABLATION N/A 12/09/2016   Procedure: Atrial Fibrillation Ablation;  Surgeon: Hillis Range, MD;  Location: [facility] INVASIVE CV LAB;  Service: Cardiovascular;  Laterality: N/A;   ATRIAL FIBRILLATION ABLATION N/A 11/09/2019   Procedure: ATRIAL FIBRILLATION ABLATION;  Surgeon: Hillis Range, MD;  Location: [facility] INVASIVE CV LAB;  Service: Cardiovascular;  Laterality: N/A;   CHOLECYSTECTOMY  2022   CLIPPING OF ATRIAL APPENDAGE N/A 10/18/2022   Procedure: CLIPPING OF LEFT ATRIAL APPENDAGE;  Surgeon: Lyn Hollingshead, MD;  Location: [facility] OR;  Service: Open Heart Surgery;  Laterality: N/A;   HAND SURGERY Right ~ 2008   "had pins in it; pulled them"   TEE WITHOUT CARDIOVERSION N/A 12/08/2016   Procedure: TRANSESOPHAGEAL ECHOCARDIOGRAM (TEE);  Surgeon: Jake Bathe, MD;  Location: [facility] ENDOSCOPY;  Service: Cardiovascular;  Laterality: N/A;   TEE WITHOUT CARDIOVERSION N/A 10/18/2022   Procedure: TRANSESOPHAGEAL ECHOCARDIOGRAM;  Surgeon: Lyn Hollingshead, MD;  Location: [facility] OR;  Service: Open Heart Surgery;  Laterality: N/A;   TONSILLECTOMY AND ADENOIDECTOMY     "in my teens"    Family History  Problem Relation Age of Onset   Diabetes Mother    Gallbladder disease Mother    Hypertension Father    Supraventricular tachycardia Brother    Cancer Maternal Grandmother    Cancer Maternal Grandfather    Cirrhosis Maternal Grandfather    Stroke Paternal Grandmother    Celiac disease Neg Hx    Breast cancer Neg Hx    Cholelithiasis Neg Hx    Colon cancer Neg Hx    Colon polyps Neg Hx    Crohn's disease Neg Hx    Esophageal cancer Neg Hx    Hepatitis Neg Hx    Inflammatory bowel disease Neg Hx    Liver disease Neg Hx    Pancreatitis Neg Hx    Stomach cancer Neg Hx    Thyroid disease Neg Hx    Ulcerative colitis Neg Hx    Social History   Tobacco Use   Smoking status: Former    Packs/day: .5    Types: Cigarettes    Quit date: 2016    Years since  quitting: 8.5   Smokeless tobacco: Current    Types: Snuff   Tobacco comments:    Nicotine patches sent 07/12/22  Vaping Use   Vaping Use: Never used  Substance Use Topics   Alcohol use: Not Currently    Comment: last drink 2018   Drug use: Not Currently    Types: Marijuana    Comment: 12/09/2016 "daily from age 75 to 89"    Allergies  Allergen Reactions   Morphine Nausea And  Vomiting    Current Meds  Medication Sig   pantoprazole (PROTONIX) 40 MG tablet TAKE 1 TABLET(40 MG) BY MOUTH DAILY    Objective: BP 122/83   Pulse 94   Ht 6' (1.829 m)   Wt (!) 313 lb (142 kg)   BMI 42.45 kg/m   Physical Exam:  General: Alert and oriented. and No acute distress. Gait: Normal gait.  Right shoulder without deformity.  Tenderness palpation of the anterior shoulder.  He has full forward flexion, abduction and external rotation compared to contralateral side.  5/5 deltoid, supraspinatus and infraspinatus strength testing.  5/5 subscapularis strength.  Positive speeds.  Positive O'Brien's.  Negative Yergason's.   IMAGING: I personally ordered and reviewed the following images  X-rays the right shoulder were obtained in clinic today.  No acute injuries are noted.  Well-positioned glenohumeral joint.  No evidence of proximal humeral migration.  No osteophytes are appreciated.  No bony lesions.  Impression: Negative right shoulder x-ray   New Medications:  No orders of the defined types were placed in this encounter.     Oliver Barre, MD  03/01/2023 10:49 AM

## 2023-03-02 ENCOUNTER — Other Ambulatory Visit (HOSPITAL_COMMUNITY): Payer: Self-pay | Admitting: Psychiatry

## 2023-03-02 DIAGNOSIS — F410 Panic disorder [episodic paroxysmal anxiety] without agoraphobia: Secondary | ICD-10-CM

## 2023-03-25 ENCOUNTER — Ambulatory Visit (HOSPITAL_COMMUNITY): Payer: Medicare Other | Attending: Internal Medicine | Admitting: Occupational Therapy

## 2023-03-25 ENCOUNTER — Other Ambulatory Visit: Payer: Self-pay

## 2023-03-25 ENCOUNTER — Encounter (HOSPITAL_COMMUNITY): Payer: Self-pay | Admitting: Occupational Therapy

## 2023-03-25 DIAGNOSIS — G8929 Other chronic pain: Secondary | ICD-10-CM | POA: Insufficient documentation

## 2023-03-25 DIAGNOSIS — M25511 Pain in right shoulder: Secondary | ICD-10-CM | POA: Insufficient documentation

## 2023-03-25 DIAGNOSIS — R29898 Other symptoms and signs involving the musculoskeletal system: Secondary | ICD-10-CM | POA: Insufficient documentation

## 2023-03-25 DIAGNOSIS — M7521 Bicipital tendinitis, right shoulder: Secondary | ICD-10-CM | POA: Insufficient documentation

## 2023-03-25 NOTE — Patient Instructions (Signed)
Repeat all exercises 10-15 times, 1-2 times per day.  1) Shoulder Protraction    Begin with elbows by your side, slowly "punch" straight out in front of you.      2) Shoulder Flexion Standing:         Begin with arms at your side with thumbs pointed up, slowly raise both arms up and forward towards overhead.               3) Horizontal abduction/adduction  Standing:           Begin with arms straight out in front of you, bring out to the side in at "T" shape. Keep arms straight entire time.                 4) Internal & External Rotation  Standing:     Stand with elbows at the side and elbows bent 90 degrees. Move your forearms away from your body, then bring back inward toward the body.     5) Shoulder Abduction  Standing:       Lying on your back begin with your arms flat on the table next to your side. Slowly move your arms out to the side so that they go overhead, in a jumping jack or snow angel movement.     

## 2023-03-25 NOTE — Therapy (Signed)
OUTPATIENT OCCUPATIONAL THERAPY ORTHO EVALUATION  Patient Name: Nathaniel [name] MRN: [id number] DOB:1991/01/25, 32 y.o., male Today's Date: 03/25/2023   END OF SESSION:  OT End of Session - 03/25/23 1410     Visit Number 1    Number of Visits 4    Date for OT Re-Evaluation 04/24/23    Authorization Type Medicare A & B    OT Start Time 1348    OT Stop Time 1408    OT Time Calculation (min) 20 min    Activity Tolerance Patient tolerated treatment well    Behavior During Therapy WFL for tasks assessed/performed             Past Medical History:  Diagnosis Date   Acute pancreatitis without infection or necrosis 03/05/2016   Anginal pain (HCC)    Happens around moments of A.Fib   Anxiety    Arthritis    Back   Atrial fibrillation (HCC)    Atrial flutter (HCC) 01/17/2016   Admx 5/17 with AFlutter with RVR in setting of pancreatitis // CHADS2-VASc=1 (CHF) // Anticoag not started // Amiodarone >> NSR    Cardiomyopathy (HCC)    CHF (congestive heart failure) (HCC)    Chronic lower back pain    Chronic systolic CHF (congestive heart failure) (HCC)    Echo 01/17/16 - EF 35-40%, diffuse HK, mild LAE    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HCC) 02/03/2016   a. EF 35-40% in setting of AFlutter with RVR in 5/17 // probably tachycardia mediated // needs FU echo 2-3 mos after NSR restored // b. Limited Echo 7/17: EF 45-50%, diff HK, mild LAE   Depression    "last 2-3 months; never treated for it" (12/09/2016)   Dysrhythmia    A. Fib   Fatty liver 12/2015   per CT   Gastroenteritis 06/05/2017   GERD (gastroesophageal reflux disease)    Headache    "monthly" (12/09/2016)   Obesity    BMI 45 12/2015   Pancreatic pseudocyst    Pancreatitis 2012   recurrent acute. Admissions to [facility], 12/2015 admissin to [facility] hospital, ct then shows pseudocyst.    Pancreatitis 2012 X 2; 2017 X 2   Pancreatitis 03/05/2016   Paroxysmal atrial fibrillation (HCC)    Typical atrial flutter (HCC) 06/06/2017    Past Surgical History:  Procedure Laterality Date   ATRIAL FIBRILLATION ABLATION N/A 12/09/2016   Procedure: Atrial Fibrillation Ablation;  Surgeon: Hillis Range, MD;  Location: [facility] INVASIVE CV LAB;  Service: Cardiovascular;  Laterality: N/A;   ATRIAL FIBRILLATION ABLATION N/A 11/09/2019   Procedure: ATRIAL FIBRILLATION ABLATION;  Surgeon: Hillis Range, MD;  Location: [facility] INVASIVE CV LAB;  Service: Cardiovascular;  Laterality: N/A;   CHOLECYSTECTOMY  2022   CLIPPING OF ATRIAL APPENDAGE N/A 10/18/2022   Procedure: CLIPPING OF LEFT ATRIAL APPENDAGE;  Surgeon: Lyn Hollingshead, MD;  Location: [facility] OR;  Service: Open Heart Surgery;  Laterality: N/A;   HAND SURGERY Right ~ 2008   "had pins in it; pulled them"   TEE WITHOUT CARDIOVERSION N/A 12/08/2016   Procedure: TRANSESOPHAGEAL ECHOCARDIOGRAM (TEE);  Surgeon: Jake Bathe, MD;  Location: [facility] ENDOSCOPY;  Service: Cardiovascular;  Laterality: N/A;   TEE WITHOUT CARDIOVERSION N/A 10/18/2022   Procedure: TRANSESOPHAGEAL ECHOCARDIOGRAM;  Surgeon: Lyn Hollingshead, MD;  Location: [facility] OR;  Service: Open Heart Surgery;  Laterality: N/A;   TONSILLECTOMY AND ADENOIDECTOMY     "in my teens"   Patient Active Problem List   Diagnosis Date Noted   Atrial  fibrillation (HCC) 10/18/2022   Atrial fibrillation, persistent (HCC) 10/18/2022   Tobacco use disorder: dip 07/12/2022   Hypercoagulable state due to persistent atrial fibrillation (HCC) 07/06/2022   Long-term current use of benzodiazepine 06/22/2022   Generalized anxiety disorder with panic attacks 06/22/2022   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   Pancreatic divisum 05/11/2018   Acute pancreatitis 05/10/2018   Hypertension 05/10/2018   Acute on chronic pancreatitis (HCC) 08/06/2017   Obesity, Class III, BMI 40-49.9 (morbid obesity) (HCC) 08/06/2017   History of cardiomyopathy 06/05/2017   Hypokalemia 06/05/2017   Hypomagnesemia 06/05/2017   Paroxysmal atrial fibrillation (HCC) 12/09/2016   A-fib  (HCC) 12/09/2016   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HCC) 02/03/2016   Chronic systolic CHF (congestive heart failure) (HCC)    Morbid obesity due to excess calories (HCC)    Dyslipidemia    History of pancreatitis 01/17/2016   Snoring 01/17/2016   Former smoker 01/17/2016   Alcohol abuse, in remission 01/17/2016   Atrial flutter (HCC) 01/17/2016    PCP: Dr. Lia Hopping REFERRING PROVIDER: Dr. Thane Edu  ONSET DATE: summer 2023  REFERRING DIAG: bicep tendinitis or RUE  THERAPY DIAG:  Chronic right shoulder pain  Other symptoms and signs involving the musculoskeletal system  Rationale for Evaluation and Treatment: Rehabilitation  SUBJECTIVE:   SUBJECTIVE STATEMENT: S: It's been going on for about a year.  Pt accompanied by: self  PERTINENT HISTORY: Pt is a 31 y/o male presenting with RUE pain that began after throwing a ball for his down  PRECAUTIONS: None  WEIGHT BEARING RESTRICTIONS: No  PAIN:  Are you having pain? No  FALLS: Has patient fallen in last 6 months? No  PLOF: Independent  PATIENT GOALS: To have less pain.   NEXT MD VISIT: None scheduled  OBJECTIVE:   HAND DOMINANCE: Right  ADLs: Overall ADLs: pt reports difficulty with working overhead, pushing heavy items, carrying or lifting items with arms extended. Pt reports pain at night, does wake him up at times.   FUNCTIONAL OUTCOME MEASURES: FOTO: complete next session  UPPER EXTREMITY ROM:       Assessed in sitting, er/IR adducted  Active ROM Right eval  Shoulder flexion 160  Shoulder abduction 145  Shoulder internal rotation 90  Shoulder external rotation 50  (Blank rows = not tested)    UPPER EXTREMITY MMT:     Assessed in sitting, er/IR adducted  MMT Right eval  Shoulder flexion 5/5  Shoulder abduction 4+/5  Shoulder internal rotation 5/5  Shoulder external rotation 5/5  (Blank rows = not tested)   SENSATION: Tingling at all times  COGNITION: Overall cognitive status:  Within functional limits for tasks assessed  OBSERVATIONS: mod fascial restrictions in anterior shoulder, max in trapezius   TODAY'S TREATMENT:                                                                                                                              DATE: N/A-eval only  PATIENT EDUCATION: Education details: A/ROM Person educated: Patient Education method: Programmer, multimedia, Facilities manager, and Handouts Education comprehension: verbalized understanding and returned demonstration  HOME EXERCISE PROGRAM: Eval: A/ROM  GOALS: Goals reviewed with patient? Yes   SHORT TERM GOALS: Target date: 04/25/23  Pt will be provided with and educated on HEP to improve mobility in RUE required for use during ADL completion.   Goal status: INITIAL  2.  Pt will increase RUE A/ROM by 10 degrees to improve ability to use RUE to reach overhead and behind back during dressing and bathing tasks.   Goal status: INITIAL  3.  Pt will increase RUE strength to 5/5 throughout to improve ability to perform lifting and carrying tasks with greater ease.   Goal status: INITIAL  4.  Pt will decrease pain in RUE to 2/10 or less to improve ability to perform overhead tasks without need for greater than 1 rest break.   Goal status: INITIAL  5.  Pt will decrease RUE fascial restrictions to min amounts or less to improve ability to perform functional reaching tasks using RUE as dominant.   Goal status: INITIAL     ASSESSMENT:  CLINICAL IMPRESSION: Patient is a 32 y.o. male who was seen today for occupational therapy evaluation for right shoulder pain/bicep tendinitis. Pt presents with increased pain and fascial restrictions, mildly decreased ROM, strength, and functional use of the RUE. Pt reports he can perform most tasks, however has pain with majority of tasks. Reaching into IR behind his back is difficult.   PERFORMANCE DEFICITS: in functional skills including in functional skills  including ADLs, IADLs, coordination, tone, ROM, strength, pain, fascial restrictions, muscle spasms, and UE functional use  IMPAIRMENTS: are limiting patient from ADLs, IADLs, rest and sleep, and leisure.   COMORBIDITIES: has no other co-morbidities that affects occupational performance. Patient will benefit from skilled OT to address above impairments and improve overall function.  MODIFICATION OR ASSISTANCE TO COMPLETE EVALUATION: No modification of tasks or assist necessary to complete an evaluation.  OT OCCUPATIONAL PROFILE AND HISTORY: Problem focused assessment: Including review of records relating to presenting problem.  CLINICAL DECISION MAKING: LOW - limited treatment options, no task modification necessary  REHAB POTENTIAL: Good  EVALUATION COMPLEXITY: Low      PLAN:  OT FREQUENCY: 1x/week  OT DURATION: 4 weeks  PLANNED INTERVENTIONS: self care/ADL training, therapeutic exercise, therapeutic activity, neuromuscular re-education, manual therapy, passive range of motion, splinting, electrical stimulation, ultrasound, moist heat, cryotherapy, patient/family education, and DME and/or AE instructions  RECOMMENDED OTHER SERVICES: None at this time  CONSULTED AND AGREED WITH PLAN OF CARE: Patient  PLAN FOR NEXT SESSION: Follow up on HEP, begin working on IR behind back, manual techniques, scapular theraband   Ezra Sites, OTR/L  [phone] 03/25/2023, 2:10 PM

## 2023-04-01 ENCOUNTER — Encounter (HOSPITAL_COMMUNITY): Payer: Self-pay | Admitting: Occupational Therapy

## 2023-04-01 ENCOUNTER — Ambulatory Visit (HOSPITAL_COMMUNITY): Payer: Medicare HMO | Attending: Internal Medicine | Admitting: Occupational Therapy

## 2023-04-01 DIAGNOSIS — M25511 Pain in right shoulder: Secondary | ICD-10-CM | POA: Diagnosis not present

## 2023-04-01 DIAGNOSIS — G8929 Other chronic pain: Secondary | ICD-10-CM | POA: Diagnosis not present

## 2023-04-01 DIAGNOSIS — R29898 Other symptoms and signs involving the musculoskeletal system: Secondary | ICD-10-CM | POA: Diagnosis not present

## 2023-04-01 NOTE — Therapy (Signed)
OUTPATIENT OCCUPATIONAL THERAPY ORTHO TREATMENT  Patient Name: Nathaniel [name] MRN: [id number] DOB:09-01-1990, 32 y.o., male Today's Date: 04/01/2023   END OF SESSION:  OT End of Session - 04/01/23 1430     Visit Number 2    Number of Visits 4    Date for OT Re-Evaluation 04/24/23    Authorization Type Medicare A & B    Authorization - Visit Number 1    OT Start Time 1345    OT Stop Time 1427    OT Time Calculation (min) 42 min    Activity Tolerance Patient tolerated treatment well    Behavior During Therapy WFL for tasks assessed/performed              Past Medical History:  Diagnosis Date   Acute pancreatitis without infection or necrosis 03/05/2016   Anginal pain (HCC)    Happens around moments of A.Fib   Anxiety    Arthritis    Back   Atrial fibrillation (HCC)    Atrial flutter (HCC) 01/17/2016   Admx 5/17 with AFlutter with RVR in setting of pancreatitis // CHADS2-VASc=1 (CHF) // Anticoag not started // Amiodarone >> NSR    Cardiomyopathy (HCC)    CHF (congestive heart failure) (HCC)    Chronic lower back pain    Chronic systolic CHF (congestive heart failure) (HCC)    Echo 01/17/16 - EF 35-40%, diffuse HK, mild LAE    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HCC) 02/03/2016   a. EF 35-40% in setting of AFlutter with RVR in 5/17 // probably tachycardia mediated // needs FU echo 2-3 mos after NSR restored // b. Limited Echo 7/17: EF 45-50%, diff HK, mild LAE   Depression    "last 2-3 months; never treated for it" (12/09/2016)   Dysrhythmia    A. Fib   Fatty liver 12/2015   per CT   Gastroenteritis 06/05/2017   GERD (gastroesophageal reflux disease)    Headache    "monthly" (12/09/2016)   Obesity    BMI 45 12/2015   Pancreatic pseudocyst    Pancreatitis 2012   recurrent acute. Admissions to [facility], 12/2015 admissin to [facility] hospital, ct then shows pseudocyst.    Pancreatitis 2012 X 2; 2017 X 2   Pancreatitis 03/05/2016   Paroxysmal atrial fibrillation (HCC)    Typical  atrial flutter (HCC) 06/06/2017   Past Surgical History:  Procedure Laterality Date   ATRIAL FIBRILLATION ABLATION N/A 12/09/2016   Procedure: Atrial Fibrillation Ablation;  Surgeon: Hillis Range, MD;  Location: [facility] INVASIVE CV LAB;  Service: Cardiovascular;  Laterality: N/A;   ATRIAL FIBRILLATION ABLATION N/A 11/09/2019   Procedure: ATRIAL FIBRILLATION ABLATION;  Surgeon: Hillis Range, MD;  Location: [facility] INVASIVE CV LAB;  Service: Cardiovascular;  Laterality: N/A;   CHOLECYSTECTOMY  2022   CLIPPING OF ATRIAL APPENDAGE N/A 10/18/2022   Procedure: CLIPPING OF LEFT ATRIAL APPENDAGE;  Surgeon: Lyn Hollingshead, MD;  Location: [facility] OR;  Service: Open Heart Surgery;  Laterality: N/A;   HAND SURGERY Right ~ 2008   "had pins in it; pulled them"   TEE WITHOUT CARDIOVERSION N/A 12/08/2016   Procedure: TRANSESOPHAGEAL ECHOCARDIOGRAM (TEE);  Surgeon: Jake Bathe, MD;  Location: [facility] ENDOSCOPY;  Service: Cardiovascular;  Laterality: N/A;   TEE WITHOUT CARDIOVERSION N/A 10/18/2022   Procedure: TRANSESOPHAGEAL ECHOCARDIOGRAM;  Surgeon: Lyn Hollingshead, MD;  Location: [facility] OR;  Service: Open Heart Surgery;  Laterality: N/A;   TONSILLECTOMY AND ADENOIDECTOMY     "in my teens"   Patient Active Problem  List   Diagnosis Date Noted   Atrial fibrillation (HCC) 10/18/2022   Atrial fibrillation, persistent (HCC) 10/18/2022   Tobacco use disorder: dip 07/12/2022   Hypercoagulable state due to persistent atrial fibrillation (HCC) 07/06/2022   Long-term current use of benzodiazepine 06/22/2022   Generalized anxiety disorder with panic attacks 06/22/2022   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   Pancreatic divisum 05/11/2018   Acute pancreatitis 05/10/2018   Hypertension 05/10/2018   Acute on chronic pancreatitis (HCC) 08/06/2017   Obesity, Class III, BMI 40-49.9 (morbid obesity) (HCC) 08/06/2017   History of cardiomyopathy 06/05/2017   Hypokalemia 06/05/2017   Hypomagnesemia 06/05/2017   Paroxysmal atrial  fibrillation (HCC) 12/09/2016   A-fib (HCC) 12/09/2016   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HCC) 02/03/2016   Chronic systolic CHF (congestive heart failure) (HCC)    Morbid obesity due to excess calories (HCC)    Dyslipidemia    History of pancreatitis 01/17/2016   Snoring 01/17/2016   Former smoker 01/17/2016   Alcohol abuse, in remission 01/17/2016   Atrial flutter (HCC) 01/17/2016    PCP: Dr. Lia Hopping REFERRING PROVIDER: Dr. Thane Edu  ONSET DATE: summer 2023  REFERRING DIAG: bicep tendinitis or RUE  THERAPY DIAG:  Chronic right shoulder pain  Other symptoms and signs involving the musculoskeletal system  Rationale for Evaluation and Treatment: Rehabilitation  SUBJECTIVE:   SUBJECTIVE STATEMENT: S: It is feeling much better.  Pt accompanied by: self  PERTINENT HISTORY: Pt is a 32 y/o male presenting with RUE pain that began after throwing a ball for his down  PRECAUTIONS: None  WEIGHT BEARING RESTRICTIONS: No  PAIN:  Are you having pain? No  FALLS: Has patient fallen in last 6 months? No  PLOF: Independent  PATIENT GOALS: To have less pain.   NEXT MD VISIT: None scheduled  OBJECTIVE:   HAND DOMINANCE: Right  ADLs: Overall ADLs: pt reports difficulty with working overhead, pushing heavy items, carrying or lifting items with arms extended. Pt reports pain at night, does wake him up at times.   FUNCTIONAL OUTCOME MEASURES: FOTO: complete next session  UPPER EXTREMITY ROM:       Assessed in sitting, er/IR adducted  Active ROM Right eval  Shoulder flexion 160  Shoulder abduction 145  Shoulder internal rotation 90  Shoulder external rotation 50  (Blank rows = not tested)    UPPER EXTREMITY MMT:     Assessed in sitting, er/IR adducted  MMT Right eval  Shoulder flexion 5/5  Shoulder abduction 4+/5  Shoulder internal rotation 5/5  Shoulder external rotation 5/5  (Blank rows = not tested)   SENSATION: Tingling at all  times  COGNITION: Overall cognitive status: Within functional limits for tasks assessed  OBSERVATIONS: mod fascial restrictions in anterior shoulder, max in trapezius   TODAY'S TREATMENT:  DATE:   04/01/23 -Manual: manual techniques to decrease fascial restrictions and increase ROM to deltoid, pectoralis, and trapezius  -A/ROM: standing, shoulder flexion, abduction, internal/external rotation 10x each -Stretching: internal rotation with towel behind back, doorway stretches holding for 30 seconds 3x  -Scapular strengthening: green band; extension, row 10x each    PATIENT EDUCATION: Education details: A/ROM Person educated: Patient Education method: Programmer, multimedia, Facilities manager, and Handouts Education comprehension: verbalized understanding and returned demonstration  HOME EXERCISE PROGRAM: Eval: A/ROM 8/2: shoulder stretches (towel and doorway)   GOALS: Goals reviewed with patient? Yes   SHORT TERM GOALS: Target date: 04/25/23  Pt will be provided with and educated on HEP to improve mobility in RUE required for use during ADL completion.   Goal status: INITIAL  2.  Pt will increase RUE A/ROM by 10 degrees to improve ability to use RUE to reach overhead and behind back during dressing and bathing tasks.   Goal status: INITIAL  3.  Pt will increase RUE strength to 5/5 throughout to improve ability to perform lifting and carrying tasks with greater ease.   Goal status: INITIAL  4.  Pt will decrease pain in RUE to 2/10 or less to improve ability to perform overhead tasks without need for greater than 1 rest break.   Goal status: INITIAL  5.  Pt will decrease RUE fascial restrictions to min amounts or less to improve ability to perform functional reaching tasks using RUE as dominant.   Goal status: INITIAL     ASSESSMENT:  CLINICAL IMPRESSION: Pt  reports that his shoulder has been feeling better just feeling tight verses painful. Completed manual techniques to deltoid, pectoralis, and trapezius regions- noted mod restrictions. Pt stated he felt a decrease in tightness after manual. Started shoulder stretches and scapular strengthening. Added shoulder stretches into HEP. VC throughout techniques for form.   PERFORMANCE DEFICITS: in functional skills including in functional skills including ADLs, IADLs, coordination, tone, ROM, strength, pain, fascial restrictions, muscle spasms, and UE functional use  IMPAIRMENTS: are limiting patient from ADLs, IADLs, rest and sleep, and leisure.   COMORBIDITIES: has no other co-morbidities that affects occupational performance. Patient will benefit from skilled OT to address above impairments and improve overall function.  MODIFICATION OR ASSISTANCE TO COMPLETE EVALUATION: No modification of tasks or assist necessary to complete an evaluation.  OT OCCUPATIONAL PROFILE AND HISTORY: Problem focused assessment: Including review of records relating to presenting problem.  CLINICAL DECISION MAKING: LOW - limited treatment options, no task modification necessary  REHAB POTENTIAL: Good  EVALUATION COMPLEXITY: Low      PLAN:  OT FREQUENCY: 1x/week  OT DURATION: 4 weeks  PLANNED INTERVENTIONS: self care/ADL training, therapeutic exercise, therapeutic activity, neuromuscular re-education, manual therapy, passive range of motion, splinting, electrical stimulation, ultrasound, moist heat, cryotherapy, patient/family education, and DME and/or AE instructions  RECOMMENDED OTHER SERVICES: None at this time  CONSULTED AND AGREED WITH PLAN OF CARE: Patient  PLAN FOR NEXT SESSION: Follow up on HEP, begin working on IR behind back, manual techniques, scapular theraband   Lurena Joiner, OTR/L  [phone] 04/01/2023, 2:30 PM

## 2023-04-01 NOTE — Patient Instructions (Signed)

## 2023-04-05 ENCOUNTER — Other Ambulatory Visit (HOSPITAL_COMMUNITY): Payer: Self-pay | Admitting: Psychiatry

## 2023-04-05 DIAGNOSIS — F410 Panic disorder [episodic paroxysmal anxiety] without agoraphobia: Secondary | ICD-10-CM

## 2023-04-15 ENCOUNTER — Encounter (HOSPITAL_COMMUNITY): Payer: Self-pay | Admitting: Occupational Therapy

## 2023-04-15 ENCOUNTER — Ambulatory Visit (HOSPITAL_COMMUNITY): Payer: Medicare HMO | Admitting: Occupational Therapy

## 2023-04-15 DIAGNOSIS — G8929 Other chronic pain: Secondary | ICD-10-CM | POA: Diagnosis not present

## 2023-04-15 DIAGNOSIS — M25511 Pain in right shoulder: Secondary | ICD-10-CM | POA: Diagnosis not present

## 2023-04-15 DIAGNOSIS — R29898 Other symptoms and signs involving the musculoskeletal system: Secondary | ICD-10-CM

## 2023-04-15 NOTE — Therapy (Signed)
OUTPATIENT OCCUPATIONAL THERAPY ORTHO TREATMENT  Patient Name: Nathaniel [name] MRN: [id number] DOB:03/09/1991, 32 y.o., male Today's Date: 04/15/2023   END OF SESSION:  OT End of Session - 04/15/23 1429     Visit Number 2    Number of Visits 4    Date for OT Re-Evaluation 04/24/23    Authorization Type Medicare A & B    Authorization - Visit Number 2    OT Start Time 1350    OT Stop Time 1430    OT Time Calculation (min) 40 min    Activity Tolerance Patient tolerated treatment well    Behavior During Therapy WFL for tasks assessed/performed               Past Medical History:  Diagnosis Date   Acute pancreatitis without infection or necrosis 03/05/2016   Anginal pain (HCC)    Happens around moments of A.Fib   Anxiety    Arthritis    Back   Atrial fibrillation (HCC)    Atrial flutter (HCC) 01/17/2016   Admx 5/17 with AFlutter with RVR in setting of pancreatitis // CHADS2-VASc=1 (CHF) // Anticoag not started // Amiodarone >> NSR    Cardiomyopathy (HCC)    CHF (congestive heart failure) (HCC)    Chronic lower back pain    Chronic systolic CHF (congestive heart failure) (HCC)    Echo 01/17/16 - EF 35-40%, diffuse HK, mild LAE    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HCC) 02/03/2016   a. EF 35-40% in setting of AFlutter with RVR in 5/17 // probably tachycardia mediated // needs FU echo 2-3 mos after NSR restored // b. Limited Echo 7/17: EF 45-50%, diff HK, mild LAE   Depression    "last 2-3 months; never treated for it" (12/09/2016)   Dysrhythmia    A. Fib   Fatty liver 12/2015   per CT   Gastroenteritis 06/05/2017   GERD (gastroesophageal reflux disease)    Headache    "monthly" (12/09/2016)   Obesity    BMI 45 12/2015   Pancreatic pseudocyst    Pancreatitis 2012   recurrent acute. Admissions to [facility], 12/2015 admissin to [facility] hospital, ct then shows pseudocyst.    Pancreatitis 2012 X 2; 2017 X 2   Pancreatitis 03/05/2016   Paroxysmal atrial fibrillation (HCC)     Typical atrial flutter (HCC) 06/06/2017   Past Surgical History:  Procedure Laterality Date   ATRIAL FIBRILLATION ABLATION N/A 12/09/2016   Procedure: Atrial Fibrillation Ablation;  Surgeon: Hillis Range, MD;  Location: [facility] INVASIVE CV LAB;  Service: Cardiovascular;  Laterality: N/A;   ATRIAL FIBRILLATION ABLATION N/A 11/09/2019   Procedure: ATRIAL FIBRILLATION ABLATION;  Surgeon: Hillis Range, MD;  Location: [facility] INVASIVE CV LAB;  Service: Cardiovascular;  Laterality: N/A;   CHOLECYSTECTOMY  2022   CLIPPING OF ATRIAL APPENDAGE N/A 10/18/2022   Procedure: CLIPPING OF LEFT ATRIAL APPENDAGE;  Surgeon: Lyn Hollingshead, MD;  Location: [facility] OR;  Service: Open Heart Surgery;  Laterality: N/A;   HAND SURGERY Right ~ 2008   "had pins in it; pulled them"   TEE WITHOUT CARDIOVERSION N/A 12/08/2016   Procedure: TRANSESOPHAGEAL ECHOCARDIOGRAM (TEE);  Surgeon: Jake Bathe, MD;  Location: [facility] ENDOSCOPY;  Service: Cardiovascular;  Laterality: N/A;   TEE WITHOUT CARDIOVERSION N/A 10/18/2022   Procedure: TRANSESOPHAGEAL ECHOCARDIOGRAM;  Surgeon: Lyn Hollingshead, MD;  Location: [facility] OR;  Service: Open Heart Surgery;  Laterality: N/A;   TONSILLECTOMY AND ADENOIDECTOMY     "in my teens"   Patient Active  Problem List   Diagnosis Date Noted   Atrial fibrillation (HCC) 10/18/2022   Atrial fibrillation, persistent (HCC) 10/18/2022   Tobacco use disorder: dip 07/12/2022   Hypercoagulable state due to persistent atrial fibrillation (HCC) 07/06/2022   Long-term current use of benzodiazepine 06/22/2022   Generalized anxiety disorder with panic attacks 06/22/2022   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   Pancreatic divisum 05/11/2018   Acute pancreatitis 05/10/2018   Hypertension 05/10/2018   Acute on chronic pancreatitis (HCC) 08/06/2017   Obesity, Class III, BMI 40-49.9 (morbid obesity) (HCC) 08/06/2017   History of cardiomyopathy 06/05/2017   Hypokalemia 06/05/2017   Hypomagnesemia 06/05/2017   Paroxysmal atrial  fibrillation (HCC) 12/09/2016   A-fib (HCC) 12/09/2016   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HCC) 02/03/2016   Chronic systolic CHF (congestive heart failure) (HCC)    Morbid obesity due to excess calories (HCC)    Dyslipidemia    History of pancreatitis 01/17/2016   Snoring 01/17/2016   Former smoker 01/17/2016   Alcohol abuse, in remission 01/17/2016   Atrial flutter (HCC) 01/17/2016    PCP: Dr. Lia Hopping REFERRING PROVIDER: Dr. Thane Edu  ONSET DATE: summer 2023  REFERRING DIAG: bicep tendinitis or RUE  THERAPY DIAG:  Chronic right shoulder pain  Other symptoms and signs involving the musculoskeletal system  Rationale for Evaluation and Treatment: Rehabilitation  SUBJECTIVE:   SUBJECTIVE STATEMENT: S: It is feeling much better.  Pt accompanied by: self  PERTINENT HISTORY: Pt is a 32 y/o male presenting with RUE pain that began after throwing a ball for his down  PRECAUTIONS: None  WEIGHT BEARING RESTRICTIONS: No  PAIN:  Are you having pain? No  FALLS: Has patient fallen in last 6 months? No  PLOF: Independent  PATIENT GOALS: To have less pain.   NEXT MD VISIT: None scheduled  OBJECTIVE:   HAND DOMINANCE: Right  ADLs: Overall ADLs: pt reports difficulty with working overhead, pushing heavy items, carrying or lifting items with arms extended. Pt reports pain at night, does wake him up at times.   FUNCTIONAL OUTCOME MEASURES: FOTO: complete next session  UPPER EXTREMITY ROM:       Assessed in sitting, er/IR adducted  Active ROM Right eval  Shoulder flexion 160  Shoulder abduction 145  Shoulder internal rotation 90  Shoulder external rotation 50  (Blank rows = not tested)    UPPER EXTREMITY MMT:     Assessed in sitting, er/IR adducted  MMT Right eval  Shoulder flexion 5/5  Shoulder abduction 4+/5  Shoulder internal rotation 5/5  Shoulder external rotation 5/5  (Blank rows = not tested)   SENSATION: Tingling at all  times  COGNITION: Overall cognitive status: Within functional limits for tasks assessed  OBSERVATIONS: mod fascial restrictions in anterior shoulder, max in trapezius   TODAY'S TREATMENT:  DATE:   04/15/2023 -Manual: manual techniques to decrease fascial restrictions and increase ROM to deltoid, pectoralis, and trapezius  -A/ROM: standing, shoulder flexion, abduction, internal/external rotation 10x each -Scapular strengthening: red band; extension, row 10x each, loop band: scap clocks, wall taps, slide up wall into y 10x each  -Overhead lacing: lacing up and down chain with RUE   - UBE; level 3 KPH 4.5+  04/01/23 -Manual: manual techniques to decrease fascial restrictions and increase ROM to deltoid, pectoralis, and trapezius  -A/ROM: standing, shoulder flexion, abduction, internal/external rotation 10x each -Stretching: internal rotation with towel behind back, doorway stretches holding for 30 seconds 3x  -Scapular strengthening: green band; extension, row 10x each    PATIENT EDUCATION: Education details: A/ROM Person educated: Patient Education method: Programmer, multimedia, Facilities manager, and Handouts Education comprehension: verbalized understanding and returned demonstration  HOME EXERCISE PROGRAM: Eval: A/ROM 8/2: shoulder stretches (towel and doorway)   GOALS: Goals reviewed with patient? Yes   SHORT TERM GOALS: Target date: 04/25/23  Pt will be provided with and educated on HEP to improve mobility in RUE required for use during ADL completion.   Goal status: INITIAL  2.  Pt will increase RUE A/ROM by 10 degrees to improve ability to use RUE to reach overhead and behind back during dressing and bathing tasks.   Goal status: INITIAL  3.  Pt will increase RUE strength to 5/5 throughout to improve ability to perform lifting and carrying tasks with greater  ease.   Goal status: INITIAL  4.  Pt will decrease pain in RUE to 2/10 or less to improve ability to perform overhead tasks without need for greater than 1 rest break.   Goal status: INITIAL  5.  Pt will decrease RUE fascial restrictions to min amounts or less to improve ability to perform functional reaching tasks using RUE as dominant.   Goal status: INITIAL     ASSESSMENT:  CLINICAL IMPRESSION: Pt reports that his shoulder has continuously felt tight since last session. He reports that he has been completing shoulder stretches at home. Added scapular loop therabnd exercises to HEP and went over during session. Pt stated that when completing scapular theraband exercises he feels a pop in his shoulder when bringing arms back to side from movement.   PERFORMANCE DEFICITS: in functional skills including in functional skills including ADLs, IADLs, coordination, tone, ROM, strength, pain, fascial restrictions, muscle spasms, and UE functional use  IMPAIRMENTS: are limiting patient from ADLs, IADLs, rest and sleep, and leisure.   COMORBIDITIES: has no other co-morbidities that affects occupational performance. Patient will benefit from skilled OT to address above impairments and improve overall function.  MODIFICATION OR ASSISTANCE TO COMPLETE EVALUATION: No modification of tasks or assist necessary to complete an evaluation.  OT OCCUPATIONAL PROFILE AND HISTORY: Problem focused assessment: Including review of records relating to presenting problem.  CLINICAL DECISION MAKING: LOW - limited treatment options, no task modification necessary  REHAB POTENTIAL: Good  EVALUATION COMPLEXITY: Low      PLAN:  OT FREQUENCY: 1x/week  OT DURATION: 4 weeks  PLANNED INTERVENTIONS: self care/ADL training, therapeutic exercise, therapeutic activity, neuromuscular re-education, manual therapy, passive range of motion, splinting, electrical stimulation, ultrasound, moist heat, cryotherapy,  patient/family education, and DME and/or AE instructions  RECOMMENDED OTHER SERVICES: None at this time  CONSULTED AND AGREED WITH PLAN OF CARE: Patient  PLAN FOR NEXT SESSION: Follow up on HEP, begin working on IR behind back, manual techniques, scapular theraband   Lurena Joiner, OTR/L  [phone]  04/15/2023, 2:30 PM

## 2023-04-15 NOTE — Patient Instructions (Signed)
Complete ___10____ repetitions each. Complete ____2___ time a day.  Wall taps with theraband  With a looped elastic band around forearms/wrists and arms at a 90 angle pressed against the wall. Keeping elbows on the wall, tap right arm out to the right. Hold for 1 second. Return right arm back to neutral. Repeat with left arm.    Wall V slides with Theraband  Place a band loop around hands/forearms and face a wall. Extend both arms diagonally into a V shape on the wall. Hold this stretch for specified amount of time. Lower arms slowly back into neutral position. Repeat.       scap clocks  Tie a loop with a theraband and place around your wrists.  Stand with a wall in front of you.  Picture a clock in front of you.  Place both palms on the wall, arms straight.  While keeping the left/right hand planted, use the right/left hand to pull away and tap each number (1, 3, 5- right OR 11,9,7 left), coming back to center each time.

## 2023-04-18 DIAGNOSIS — M9903 Segmental and somatic dysfunction of lumbar region: Secondary | ICD-10-CM | POA: Diagnosis not present

## 2023-04-18 DIAGNOSIS — M62830 Muscle spasm of back: Secondary | ICD-10-CM | POA: Diagnosis not present

## 2023-04-18 DIAGNOSIS — M546 Pain in thoracic spine: Secondary | ICD-10-CM | POA: Diagnosis not present

## 2023-04-18 DIAGNOSIS — M9902 Segmental and somatic dysfunction of thoracic region: Secondary | ICD-10-CM | POA: Diagnosis not present

## 2023-04-18 DIAGNOSIS — M9905 Segmental and somatic dysfunction of pelvic region: Secondary | ICD-10-CM | POA: Diagnosis not present

## 2023-04-22 ENCOUNTER — Encounter (HOSPITAL_COMMUNITY): Payer: Self-pay | Admitting: Occupational Therapy

## 2023-04-22 ENCOUNTER — Ambulatory Visit (HOSPITAL_COMMUNITY): Payer: Medicare HMO | Admitting: Occupational Therapy

## 2023-04-22 DIAGNOSIS — G8929 Other chronic pain: Secondary | ICD-10-CM

## 2023-04-22 DIAGNOSIS — M25511 Pain in right shoulder: Secondary | ICD-10-CM | POA: Diagnosis not present

## 2023-04-22 DIAGNOSIS — R29898 Other symptoms and signs involving the musculoskeletal system: Secondary | ICD-10-CM

## 2023-04-22 NOTE — Therapy (Signed)
OUTPATIENT OCCUPATIONAL THERAPY ORTHO TREATMENT  Patient Name: Nathaniel [name] MRN: [id number] DOB:1991/03/01, 32 y.o., male Today's Date: 04/22/2023   END OF SESSION:  OT End of Session - 04/22/23 1435     Visit Number 4    Number of Visits 5    Date for OT Re-Evaluation 04/24/23    Authorization Type Medicare A & B    OT Start Time [id number]    OT Stop Time 1033    OT Time Calculation (min) 41 min    Activity Tolerance Patient tolerated treatment well    Behavior During Therapy [facility] for tasks assessed/performed             Past Medical History:  Diagnosis Date   Acute pancreatitis without infection or necrosis 03/05/2016   Anginal pain (HCC)    Happens around moments of A.Fib   Anxiety    Arthritis    Back   Atrial fibrillation (HCC)    Atrial flutter (HCC) 01/17/2016   Admx 5/17 with AFlutter with RVR in setting of pancreatitis // CHADS2-VASc=1 (CHF) // Anticoag not started // Amiodarone >> NSR    Cardiomyopathy (HCC)    CHF (congestive heart failure) (HCC)    Chronic lower back pain    Chronic systolic CHF (congestive heart failure) (HCC)    Echo 01/17/16 - EF 35-40%, diffuse HK, mild LAE    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HCC) 02/03/2016   a. EF 35-40% in setting of AFlutter with RVR in 5/17 // probably tachycardia mediated // needs FU echo 2-3 mos after NSR restored // b. Limited Echo 7/17: EF 45-50%, diff HK, mild LAE   Depression    "last 2-3 months; never treated for it" (12/09/2016)   Dysrhythmia    A. Fib   Fatty liver 12/2015   per CT   Gastroenteritis 06/05/2017   GERD (gastroesophageal reflux disease)    Headache    "monthly" (12/09/2016)   Obesity    BMI 45 12/2015   Pancreatic pseudocyst    Pancreatitis 2012   recurrent acute. Admissions to [facility], 12/2015 admissin to [facility], ct then shows pseudocyst.    Pancreatitis 2012 X 2; 2017 X 2   Pancreatitis 03/05/2016   Paroxysmal atrial fibrillation (HCC)    Typical atrial flutter (HCC) 06/06/2017    Past Surgical History:  Procedure Laterality Date   ATRIAL FIBRILLATION ABLATION N/A 12/09/2016   Procedure: Atrial Fibrillation Ablation;  Surgeon: Hillis Range, MD;  Location: [facility] INVASIVE CV LAB;  Service: Cardiovascular;  Laterality: N/A;   ATRIAL FIBRILLATION ABLATION N/A 11/09/2019   Procedure: ATRIAL FIBRILLATION ABLATION;  Surgeon: Hillis Range, MD;  Location: [facility] INVASIVE CV LAB;  Service: Cardiovascular;  Laterality: N/A;   CHOLECYSTECTOMY  2022   CLIPPING OF ATRIAL APPENDAGE N/A 10/18/2022   Procedure: CLIPPING OF LEFT ATRIAL APPENDAGE;  Surgeon: Lyn Hollingshead, MD;  Location: [facility] OR;  Service: Open Heart Surgery;  Laterality: N/A;   HAND SURGERY Right ~ 2008   "had pins in it; pulled them"   TEE WITHOUT CARDIOVERSION N/A 12/08/2016   Procedure: TRANSESOPHAGEAL ECHOCARDIOGRAM (TEE);  Surgeon: Jake Bathe, MD;  Location: [facility] ENDOSCOPY;  Service: Cardiovascular;  Laterality: N/A;   TEE WITHOUT CARDIOVERSION N/A 10/18/2022   Procedure: TRANSESOPHAGEAL ECHOCARDIOGRAM;  Surgeon: Lyn Hollingshead, MD;  Location: [facility] OR;  Service: Open Heart Surgery;  Laterality: N/A;   TONSILLECTOMY AND ADENOIDECTOMY     "in my teens"   Patient Active Problem List   Diagnosis Date Noted   Atrial  fibrillation (HCC) 10/18/2022   Atrial fibrillation, persistent (HCC) 10/18/2022   Tobacco use disorder: dip 07/12/2022   Hypercoagulable state due to persistent atrial fibrillation (HCC) 07/06/2022   Long-term current use of benzodiazepine 06/22/2022   Generalized anxiety disorder with panic attacks 06/22/2022   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   Pancreatic divisum 05/11/2018   Acute pancreatitis 05/10/2018   Hypertension 05/10/2018   Acute on chronic pancreatitis (HCC) 08/06/2017   Obesity, Class III, BMI 40-49.9 (morbid obesity) (HCC) 08/06/2017   History of cardiomyopathy 06/05/2017   Hypokalemia 06/05/2017   Hypomagnesemia 06/05/2017   Paroxysmal atrial fibrillation (HCC) 12/09/2016   A-fib  (HCC) 12/09/2016   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HCC) 02/03/2016   Chronic systolic CHF (congestive heart failure) (HCC)    Morbid obesity due to excess calories (HCC)    Dyslipidemia    History of pancreatitis 01/17/2016   Snoring 01/17/2016   Former smoker 01/17/2016   Alcohol abuse, in remission 01/17/2016   Atrial flutter (HCC) 01/17/2016    PCP: Dr. Lia Hopping REFERRING PROVIDER: Dr. Thane Edu  ONSET DATE: summer 2023  REFERRING DIAG: bicep tendinitis or RUE  THERAPY DIAG:  Chronic right shoulder pain  Other symptoms and signs involving the musculoskeletal system  Rationale for Evaluation and Treatment: Rehabilitation  SUBJECTIVE:   SUBJECTIVE STATEMENT: S: I'm not as sore this week.  Pt accompanied by: self  PERTINENT HISTORY: Pt is a 32 y/o male presenting with RUE pain that began after throwing a ball for his down  PRECAUTIONS: None  WEIGHT BEARING RESTRICTIONS: No  PAIN:  Are you having pain? Yes: NPRS scale: 4/10 Pain location: anterior shoulder girdle Pain description: aching Aggravating factors: over moving Relieving factors: rest  FALLS: Has patient fallen in last 6 months? No  PLOF: Independent  PATIENT GOALS: To have less pain.   NEXT MD VISIT: None scheduled  OBJECTIVE:   HAND DOMINANCE: Right  ADLs: Overall ADLs: pt reports difficulty with working overhead, pushing heavy items, carrying or lifting items with arms extended. Pt reports pain at night, does wake him up at times.   FUNCTIONAL OUTCOME MEASURES: FOTO: complete next session  UPPER EXTREMITY ROM:       Assessed in sitting, er/IR adducted  Active ROM Right eval  Shoulder flexion 160  Shoulder abduction 145  Shoulder internal rotation 90  Shoulder external rotation 50  (Blank rows = not tested)    UPPER EXTREMITY MMT:     Assessed in sitting, er/IR adducted  MMT Right eval  Shoulder flexion 5/5  Shoulder abduction 4+/5  Shoulder internal rotation 5/5   Shoulder external rotation 5/5  (Blank rows = not tested)   SENSATION: Tingling at all times  COGNITION: Overall cognitive status: Within functional limits for tasks assessed  OBSERVATIONS: mod fascial restrictions in anterior shoulder, max in trapezius   TODAY'S TREATMENT:  DATE:   04/22/23 -manual therapy: myofascial release and trigger point applied to biceps, deltoid, scapularis, and trapezius in order to reduce fascial restriction and pain, as well as to improve ROM. -A/ROM: sitting, flexion, abduction, protraction, horizontal abduction, er/IR, x12 -Proximal Shoulder Exercise: paddles, criss cross, circles both directions, x10 -ABC's in the air -PNF Strengthening: chest pulls, over head pulls, er pulls, PNF up, PNF down, x10 -Ball on the wall: flexion, abduction, x10  04/15/2023 -Manual: manual techniques to decrease fascial restrictions and increase ROM to deltoid, pectoralis, and trapezius  -A/ROM: standing, shoulder flexion, abduction, internal/external rotation 10x each -Scapular strengthening: red band; extension, row 10x each, loop band: scap clocks, wall taps, slide up wall into y 10x each  -Overhead lacing: lacing up and down chain with RUE   - UBE; level 3 KPH 4.5+  04/01/23 -Manual: manual techniques to decrease fascial restrictions and increase ROM to deltoid, pectoralis, and trapezius  -A/ROM: standing, shoulder flexion, abduction, internal/external rotation 10x each -Stretching: internal rotation with towel behind back, doorway stretches holding for 30 seconds 3x  -Scapular strengthening: green band; extension, row 10x each    PATIENT EDUCATION: Education details: PNF Strengthening and Publishing rights manager Person educated: Patient Education method: Explanation, Demonstration, and Handouts Education comprehension: verbalized  understanding and returned demonstration  HOME EXERCISE PROGRAM: Eval: A/ROM 8/2: shoulder stretches (towel and doorway)  8/23: PNF Strengthening and Scapular Strengthening  GOALS: Goals reviewed with patient? Yes   SHORT TERM GOALS: Target date: 04/25/23  Pt will be provided with and educated on HEP to improve mobility in RUE required for use during ADL completion.   Goal status: IN PROGRESS  2.  Pt will increase RUE A/ROM by 10 degrees to improve ability to use RUE to reach overhead and behind back during dressing and bathing tasks.   Goal status: IN PROGRESS  3.  Pt will increase RUE strength to 5/5 throughout to improve ability to perform lifting and carrying tasks with greater ease.   Goal status: IN PROGRESS  4.  Pt will decrease pain in RUE to 2/10 or less to improve ability to perform overhead tasks without need for greater than 1 rest break.   Goal status: IN PROGRESS  5.  Pt will decrease RUE fascial restrictions to min amounts or less to improve ability to perform functional reaching tasks using RUE as dominant.   Goal status: IN PROGRESS     ASSESSMENT:  CLINICAL IMPRESSION: This session pt demonstrating good ROM and overall movement pattern. His shoulder appears stable as he works through [employer] patterns and pulling exercises. Periodically pt reports that he gets an increase in throbbing/tight pain, which typically relieves with rest. OT providing verbal and tactile cuing throughout session for positioning and technique.   PERFORMANCE DEFICITS: in functional skills including in functional skills including ADLs, IADLs, coordination, tone, ROM, strength, pain, fascial restrictions, muscle spasms, and UE functional use   PLAN:  OT FREQUENCY: 1x/week  OT DURATION: 4 weeks  PLANNED INTERVENTIONS: self care/ADL training, therapeutic exercise, therapeutic activity, neuromuscular re-education, manual therapy, passive range of motion, splinting, electrical  stimulation, ultrasound, moist heat, cryotherapy, patient/family education, and DME and/or AE instructions  RECOMMENDED OTHER SERVICES: None at this time  CONSULTED AND AGREED WITH PLAN OF CARE: Patient  PLAN FOR NEXT SESSION: Follow up on HEP, begin working on IR behind back, manual techniques, scapular theraband, reassessment.    Trish Mage, OTR/L [phone] 04/22/2023, 2:37 PM

## 2023-04-22 NOTE — Patient Instructions (Signed)
1) Strengthening: Chest Pull - Resisted   Hold Theraband in front of body with hands about shoulder width a part. Pull band a part and back together slowly. Repeat __10-15__ times. Complete ___1_ set(s) per session.. Repeat ___1_ session(s) per day.  http://orth.exer.us/926   Copyright  VHI. All rights reserved.   2) PNF Strengthening: Resisted   Standing with resistive band around each hand, bring right arm up and away, thumb back. Repeat _10-15___ times per set. Do __1__ sets per session. Do __1__ sessions per day.    3) Resisted External Rotation: in Neutral - Bilateral   Sit or stand, tubing in both hands, elbows at sides, bent to 90, forearms forward. Pinch shoulder blades together and rotate forearms out. Keep elbows at sides. Repeat _10-15___ times per set. Do __1__ sets per session. Do _1___ sessions per day.  http://orth.exer.us/966   Copyright  VHI. All rights reserved.   4) PNF Strengthening: Resisted   Standing, hold resistive band above head. Bring right arm down and out from side. Repeat _10-15___ times per set. Do __1__ sets per session. Do __1__ sessions per day.  http://orth.exer.us/922   Copyright  VHI. All rights reserved.     1) (Home) Extension: Isometric / Bilateral Arm Retraction - Sitting   Facing anchor, hold hands and elbow at shoulder height, with elbow bent.  Pull arms back to squeeze shoulder blades together. Repeat 10-15 times. 1-3 times/day.   2) (Clinic) Extension / Flexion (Assist)   Face anchor, pull arms back, keeping elbow straight, and squeze shoulder blades together. Repeat 10-15 times. 1-3 times/day.   Copyright  VHI. All rights reserved.   3) (Home) Retraction: Row - Bilateral (Anchor)   Facing anchor, arms reaching forward, pull hands toward stomach, keeping elbows bent and at your sides and pinching shoulder blades together. Repeat 10-15 times. 1-3 times/day.   Copyright  VHI. All rights reserved.

## 2023-04-26 ENCOUNTER — Ambulatory Visit (HOSPITAL_COMMUNITY): Payer: Medicare HMO | Admitting: Occupational Therapy

## 2023-04-26 ENCOUNTER — Encounter (HOSPITAL_COMMUNITY): Payer: Self-pay | Admitting: Occupational Therapy

## 2023-04-26 DIAGNOSIS — G8929 Other chronic pain: Secondary | ICD-10-CM

## 2023-04-26 DIAGNOSIS — R29898 Other symptoms and signs involving the musculoskeletal system: Secondary | ICD-10-CM | POA: Diagnosis not present

## 2023-04-26 DIAGNOSIS — M25511 Pain in right shoulder: Secondary | ICD-10-CM | POA: Diagnosis not present

## 2023-04-26 NOTE — Patient Instructions (Signed)

## 2023-04-26 NOTE — Therapy (Signed)
OUTPATIENT OCCUPATIONAL THERAPY ORTHO TREATMENT  Patient Name: Nathaniel [name] MRN: [id number] DOB:1990-12-07, 32 y.o., male Today's Date: 04/26/2023  OCCUPATIONAL THERAPY DISCHARGE SUMMARY  Visits from Start of Care: 5  Current functional level related to goals / functional outcomes: Pt has met all OT goals. His ROM and Strength are WFL and pt has minimal pain and fascial restrictions at this time.    Remaining deficits: No remaining deficits.   Education / Equipment: Pt provided a comprehensive HEP and theraband's for continued progression with mobility and strength.    Plan: Patient agrees to discharge as OT goals have been met.      END OF SESSION:  OT End of Session - 04/26/23 0901     Visit Number 5    Number of Visits 5    Date for OT Re-Evaluation 04/24/23    Authorization Type Medicare A & B    OT Start Time 0819    OT Stop Time 0857    OT Time Calculation (min) 38 min    Activity Tolerance Patient tolerated treatment well    Behavior During Therapy [facility] for tasks assessed/performed             Past Medical History:  Diagnosis Date   Acute pancreatitis without infection or necrosis 03/05/2016   Anginal pain (HCC)    Happens around moments of A.Fib   Anxiety    Arthritis    Back   Atrial fibrillation (HCC)    Atrial flutter (HCC) 01/17/2016   Admx 5/17 with AFlutter with RVR in setting of pancreatitis // CHADS2-VASc=1 (CHF) // Anticoag not started // Amiodarone >> NSR    Cardiomyopathy (HCC)    CHF (congestive heart failure) (HCC)    Chronic lower back pain    Chronic systolic CHF (congestive heart failure) (HCC)    Echo 01/17/16 - EF 35-40%, diffuse HK, mild LAE    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HCC) 02/03/2016   a. EF 35-40% in setting of AFlutter with RVR in 5/17 // probably tachycardia mediated // needs FU echo 2-3 mos after NSR restored // b. Limited Echo 7/17: EF 45-50%, diff HK, mild LAE   Depression    "last 2-3 months; never treated for  it" (12/09/2016)   Dysrhythmia    A. Fib   Fatty liver 12/2015   per CT   Gastroenteritis 06/05/2017   GERD (gastroesophageal reflux disease)    Headache    "monthly" (12/09/2016)   Obesity    BMI 45 12/2015   Pancreatic pseudocyst    Pancreatitis 2012   recurrent acute. Admissions to [facility], 12/2015 admissin to [facility], ct then shows pseudocyst.    Pancreatitis 2012 X 2; 2017 X 2   Pancreatitis 03/05/2016   Paroxysmal atrial fibrillation (HCC)    Typical atrial flutter (HCC) 06/06/2017   Past Surgical History:  Procedure Laterality Date   ATRIAL FIBRILLATION ABLATION N/A 12/09/2016   Procedure: Atrial Fibrillation Ablation;  Surgeon: Hillis Range, MD;  Location: [facility] INVASIVE CV LAB;  Service: Cardiovascular;  Laterality: N/A;   ATRIAL FIBRILLATION ABLATION N/A 11/09/2019   Procedure: ATRIAL FIBRILLATION ABLATION;  Surgeon: Hillis Range, MD;  Location: [facility] INVASIVE CV LAB;  Service: Cardiovascular;  Laterality: N/A;   CHOLECYSTECTOMY  2022   CLIPPING OF ATRIAL APPENDAGE N/A 10/18/2022   Procedure: CLIPPING OF LEFT ATRIAL APPENDAGE;  Surgeon: Lyn Hollingshead, MD;  Location: [facility] OR;  Service: Open Heart Surgery;  Laterality: N/A;   HAND SURGERY Right ~ 2008   "had pins  in it; pulled them"   TEE WITHOUT CARDIOVERSION N/A 12/08/2016   Procedure: TRANSESOPHAGEAL ECHOCARDIOGRAM (TEE);  Surgeon: Jake Bathe, MD;  Location: [facility] ENDOSCOPY;  Service: Cardiovascular;  Laterality: N/A;   TEE WITHOUT CARDIOVERSION N/A 10/18/2022   Procedure: TRANSESOPHAGEAL ECHOCARDIOGRAM;  Surgeon: Lyn Hollingshead, MD;  Location: [facility] OR;  Service: Open Heart Surgery;  Laterality: N/A;   TONSILLECTOMY AND ADENOIDECTOMY     "in my teens"   Patient Active Problem List   Diagnosis Date Noted   Atrial fibrillation (HCC) 10/18/2022   Atrial fibrillation, persistent (HCC) 10/18/2022   Tobacco use disorder: dip 07/12/2022   Hypercoagulable state due to persistent atrial fibrillation (HCC) 07/06/2022   Long-term  current use of benzodiazepine 06/22/2022   Generalized anxiety disorder with panic attacks 06/22/2022   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   Pancreatic divisum 05/11/2018   Acute pancreatitis 05/10/2018   Hypertension 05/10/2018   Acute on chronic pancreatitis (HCC) 08/06/2017   Obesity, Class III, BMI 40-49.9 (morbid obesity) (HCC) 08/06/2017   History of cardiomyopathy 06/05/2017   Hypokalemia 06/05/2017   Hypomagnesemia 06/05/2017   Paroxysmal atrial fibrillation (HCC) 12/09/2016   A-fib (HCC) 12/09/2016   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HCC) 02/03/2016   Chronic systolic CHF (congestive heart failure) (HCC)    Morbid obesity due to excess calories (HCC)    Dyslipidemia    History of pancreatitis 01/17/2016   Snoring 01/17/2016   Former smoker 01/17/2016   Alcohol abuse, in remission 01/17/2016   Atrial flutter (HCC) 01/17/2016    PCP: Dr. Lia Hopping REFERRING PROVIDER: Dr. Thane Edu  ONSET DATE: summer 2023  REFERRING DIAG: bicep tendinitis or RUE  THERAPY DIAG:  Chronic right shoulder pain  Other symptoms and signs involving the musculoskeletal system  Rationale for Evaluation and Treatment: Rehabilitation  SUBJECTIVE:   SUBJECTIVE STATEMENT: S: It's getting better Pt accompanied by: self  PERTINENT HISTORY: Pt is a 32 y/o male presenting with RUE pain that began after throwing a ball for his down  PRECAUTIONS: None  WEIGHT BEARING RESTRICTIONS: No  PAIN:  Are you having pain? Yes: NPRS scale: 2/10 Pain location: anterior shoulder girdle Pain description: aching Aggravating factors: over moving Relieving factors: rest  FALLS: Has patient fallen in last 6 months? No  PLOF: Independent  PATIENT GOALS: To have less pain.   NEXT MD VISIT: None scheduled  OBJECTIVE:   HAND DOMINANCE: Right  ADLs: Overall ADLs: pt reports difficulty with working overhead, pushing heavy items, carrying or lifting items with arms extended. Pt reports pain at  night, does wake him up at times.   UPPER EXTREMITY ROM:       Assessed in sitting, er/IR adducted  Active ROM Right eval Right 04/26/23  Shoulder flexion 160 165  Shoulder abduction 145 156  Shoulder internal rotation 90 90  Shoulder external rotation 50 57  (Blank rows = not tested)    UPPER EXTREMITY MMT:     Assessed in sitting, er/IR adducted  MMT Right eval Right 04/26/23  Shoulder flexion 5/5 5/5  Shoulder abduction 4+/5 5/5  Shoulder internal rotation 5/5 5/5  Shoulder external rotation 5/5 5/5  (Blank rows = not tested)   SENSATION: Tingling at all times  COGNITION: Overall cognitive status: Within functional limits for tasks assessed  OBSERVATIONS: mod fascial restrictions in anterior shoulder, max in trapezius   TODAY'S TREATMENT:  DATE:   04/26/23 -A/ROM: sitting, flexion, abduction, protraction, horizontal abduction, er/IR, x15 -Stretches: bicep stretch, er behind the back, 2x15" -Scapular Strengthening: blue band, extension, protraction, retraction, rows, x15 -Shoulder Strengthening: Blue band, horizontal abduction, er, ir, flexion, abduction, x15 -Measurements for Reassessment  04/22/23 -manual therapy: myofascial release and trigger point applied to biceps, deltoid, scapularis, and trapezius in order to reduce fascial restriction and pain, as well as to improve ROM. -A/ROM: sitting, flexion, abduction, protraction, horizontal abduction, er/IR, x12 -Proximal Shoulder Exercise: paddles, criss cross, circles both directions, x10 -ABC's in the air -PNF Strengthening: chest pulls, over head pulls, er pulls, PNF up, PNF down, x10 -Ball on the wall: flexion, abduction, x10  04/15/2023 -Manual: manual techniques to decrease fascial restrictions and increase ROM to deltoid, pectoralis, and trapezius  -A/ROM: standing, shoulder  flexion, abduction, internal/external rotation 10x each -Scapular strengthening: red band; extension, row 10x each, loop band: scap clocks, wall taps, slide up wall into y 10x each  -Overhead lacing: lacing up and down chain with RUE   - UBE; level 3 KPH 4.5+   PATIENT EDUCATION: Education details: Public relations account executive Person educated: Patient Education method: Explanation, Demonstration, and Handouts Education comprehension: verbalized understanding and returned demonstration  HOME EXERCISE PROGRAM: Eval: A/ROM 8/2: shoulder stretches (towel and doorway)  8/23: PNF Strengthening and Scapular Strengthening 8/27: Shoulder Strengthening  GOALS: Goals reviewed with patient? Yes   SHORT TERM GOALS: Target date: 04/25/23  Pt will be provided with and educated on HEP to improve mobility in RUE required for use during ADL completion.   Goal status: MET  2.  Pt will increase RUE A/ROM by 10 degrees to improve ability to use RUE to reach overhead and behind back during dressing and bathing tasks.   Goal status: MET  3.  Pt will increase RUE strength to 5/5 throughout to improve ability to perform lifting and carrying tasks with greater ease.   Goal status: MET  4.  Pt will decrease pain in RUE to 2/10 or less to improve ability to perform overhead tasks without need for greater than 1 rest break.   Goal status: MET  5.  Pt will decrease RUE fascial restrictions to min amounts or less to improve ability to perform functional reaching tasks using RUE as dominant.   Goal status: MET     ASSESSMENT:  CLINICAL IMPRESSION: Pt completed reassessment for discharge this session. His ROM is WFL and his strength is 5/5. Pt and OT completed HEP this session with adding shoulder strengthening to round out his exercises. PT demonstrating good movement pattern and has no further questions or concerns at this time. He has no further skilled OT needs and will be discharged from Outpatient  OT.   PERFORMANCE DEFICITS: in functional skills including in functional skills including ADLs, IADLs, coordination, tone, ROM, strength, pain, fascial restrictions, muscle spasms, and UE functional use   PLAN:  OT FREQUENCY: 1x/week  OT DURATION: 4 weeks  PLANNED INTERVENTIONS: self care/ADL training, therapeutic exercise, therapeutic activity, neuromuscular re-education, manual therapy, passive range of motion, splinting, electrical stimulation, ultrasound, moist heat, cryotherapy, patient/family education, and DME and/or AE instructions  RECOMMENDED OTHER SERVICES: None at this time  CONSULTED AND AGREED WITH PLAN OF CARE: Patient  PLAN FOR NEXT SESSION: Discharge   Trish Mage, OTR/L [phone] 04/26/2023, 9:02 AM

## 2023-04-29 ENCOUNTER — Encounter (HOSPITAL_COMMUNITY): Payer: Medicare Other | Admitting: Occupational Therapy

## 2023-05-16 DIAGNOSIS — M9902 Segmental and somatic dysfunction of thoracic region: Secondary | ICD-10-CM | POA: Diagnosis not present

## 2023-05-16 DIAGNOSIS — M9903 Segmental and somatic dysfunction of lumbar region: Secondary | ICD-10-CM | POA: Diagnosis not present

## 2023-05-16 DIAGNOSIS — M546 Pain in thoracic spine: Secondary | ICD-10-CM | POA: Diagnosis not present

## 2023-05-16 DIAGNOSIS — M9905 Segmental and somatic dysfunction of pelvic region: Secondary | ICD-10-CM | POA: Diagnosis not present

## 2023-05-16 DIAGNOSIS — M62830 Muscle spasm of back: Secondary | ICD-10-CM | POA: Diagnosis not present

## 2023-06-13 DIAGNOSIS — M62830 Muscle spasm of back: Secondary | ICD-10-CM | POA: Diagnosis not present

## 2023-06-13 DIAGNOSIS — M546 Pain in thoracic spine: Secondary | ICD-10-CM | POA: Diagnosis not present

## 2023-06-13 DIAGNOSIS — M9903 Segmental and somatic dysfunction of lumbar region: Secondary | ICD-10-CM | POA: Diagnosis not present

## 2023-06-13 DIAGNOSIS — M9902 Segmental and somatic dysfunction of thoracic region: Secondary | ICD-10-CM | POA: Diagnosis not present

## 2023-06-13 DIAGNOSIS — M9905 Segmental and somatic dysfunction of pelvic region: Secondary | ICD-10-CM | POA: Diagnosis not present

## 2023-06-30 NOTE — Progress Notes (Signed)
  Electrophysiology Office Note:   Date:  07/01/2023  ID:  Nathaniel [name], DOB 09-Nov-1990, MRN [id number]  Primary Cardiologist: Lanier Prude, MD Electrophysiologist: Lanier Prude, MD      History of Present Illness:   Nathaniel [name] is a 32 y.o. male with h/o pancreatitis, obesity, tachy-mediated CM, chronic CHF, and AF seen today for routine electrophysiology followup.   Since last being seen in our clinic the patient reports doing well from a cardiac perspective. No AF by symptoms or apple watch. Changed off of Zoloft for Prozac with severe HAs.  he denies chest pain, palpitations, dyspnea, PND, orthopnea, nausea, vomiting, syncope, edema, weight gain, or early satiety.  Inquires about coming off of Eliquis and Tikosyn now s/p Convergent ablation and LAA clipping.  Review of systems complete and found to be negative unless listed in HPI.   EP Information / Studies Reviewed:    EKG is ordered today. Personal review as below.  EKG Interpretation Date/Time:  Friday July 01 2023 09:23:33 EDT Ventricular Rate:  83 PR Interval:  154 QRS Duration:  86 QT Interval:  386 QTC Calculation: 453 R Axis:   48  Text Interpretation: Normal sinus rhythm Normal ECG When compared with ECG of 28-Oct-2022 13:56, No significant change was found Confirmed by Maxine Glenn [phone]) on 07/01/2023 9:29:39 AM    Arrhythmia History  PVI/CTI ablation 12/09/2016 (Dr. Johney Frame) PVI/CTI ablation 11/09/2019 (Dr. Johney Frame) Flecainide failed to maintain SR Tikosyn started Nov 2023 S/p Convergent ablation 10/08/2022  Physical Exam:   VS:  BP 116/80   Pulse 83   Ht 6' (1.829 m)   Wt (!) 326 lb 12.8 oz (148.2 kg)   SpO2 95%   BMI 44.32 kg/m    Wt Readings from Last 3 Encounters:  07/01/23 (!) 326 lb 12.8 oz (148.2 kg)  03/01/23 (!) 313 lb (142 kg)  01/17/23 (!) 308 lb 12.8 oz (140.1 kg)     GEN: Well nourished, well developed in no acute distress NECK: No JVD; No carotid  bruits CARDIAC: Regular rate and rhythm, no murmurs, rubs, gallops RESPIRATORY:  Clear to auscultation without rales, wheezing or rhonchi  ABDOMEN: Soft, non-tender, non-distended EXTREMITIES:  No edema; No deformity   ASSESSMENT AND PLAN:    Persistent AF S/p multiple ablations as above S/p LAA clipping and convergent ablation EKG today shows NSR with stable QT ~ 450-460 Continue Tikosyn 500 mcg BID Continue eliquis 5 mg BID for CHA2DS2/VASc of at least 2 (Though EF has improved) He would like to consider coming off meds.   Weight has trended back up some and the risk of recurrent AF is not 0, discussed today.  To come off Eliquis would need to prove by imaging that LAA clip is patent.  Could come off Tikosyn if desired, but discussed the need for re-admission if has to go back on in the future. Chooses to stay on for now.   Tachy-mediated CMP Normal EF 2023 No symptoms or exam findings of volume overload  Secondary hypercoagulable state Pt on Eliquis as above  Anxiety Follow QT on Prozac; Any adjustments will require EKG assessment.  Here is a study showing that the risk of QT prolongation among SSRIs is relatively similar comparing Prozac (Fluoxetine), monitoring remains important.   Paxil is felt to have the LEAST QT prolongation if other agents are considered in the future.  [email or web address].arnold.com/   Follow up with Dr. Lalla Brothers in 3 months  Signed, Graciella Freer, PA-C

## 2023-07-01 ENCOUNTER — Encounter: Payer: Self-pay | Admitting: Student

## 2023-07-01 ENCOUNTER — Ambulatory Visit: Payer: Medicare HMO | Attending: Student | Admitting: Student

## 2023-07-01 VITALS — BP 116/80 | HR 83 | Ht 72.0 in | Wt 326.8 lb

## 2023-07-01 DIAGNOSIS — I4819 Other persistent atrial fibrillation: Secondary | ICD-10-CM | POA: Diagnosis not present

## 2023-07-01 DIAGNOSIS — D6869 Other thrombophilia: Secondary | ICD-10-CM

## 2023-07-01 DIAGNOSIS — I420 Dilated cardiomyopathy: Secondary | ICD-10-CM

## 2023-07-01 NOTE — Patient Instructions (Signed)
Medication Instructions:  Your physician recommends that you continue on your current medications as directed. Please refer to the Current Medication list given to you today.  *If you need a refill on your cardiac medications before your next appointment, please call your pharmacy*  Lab Work: BMET, Mag-TODAY If you have labs (blood work) drawn today and your tests are completely normal, you will receive your results only by: MyChart Message (if you have MyChart) OR A paper copy in the mail If you have any lab test that is abnormal or we need to change your treatment, we will call you to review the results.  Follow-Up: At [facility], you and your health needs are our priority.  As part of our continuing mission to provide you with exceptional heart care, we have created designated Provider Care Teams.  These Care Teams include your primary Cardiologist (physician) and Advanced Practice Providers (APPs -  Physician Assistants and Nurse Practitioners) who all work together to provide you with the care you need, when you need it.  Your next appointment:   3 month(s)  Provider:   Steffanie Dunn, MD only to discuss blood thinner and tikosyn

## 2023-07-02 LAB — BASIC METABOLIC PANEL
BUN/Creatinine Ratio: 17 (ref 9–20)
BUN: 13 mg/dL (ref 6–20)
CO2: 23 mmol/L (ref 20–29)
Calcium: 9.1 mg/dL (ref 8.7–10.2)
Chloride: 105 mmol/L (ref 96–106)
Creatinine, Ser: 0.75 mg/dL — ABNORMAL LOW (ref 0.76–1.27)
Glucose: 99 mg/dL (ref 70–99)
Potassium: 4.1 mmol/L (ref 3.5–5.2)
Sodium: 141 mmol/L (ref 134–144)
eGFR: 122 mL/min/{1.73_m2} (ref >59)

## 2023-07-02 LAB — MAGNESIUM: Magnesium: 2.2 mg/dL (ref 1.6–2.3)

## 2023-07-04 ENCOUNTER — Emergency Department (HOSPITAL_COMMUNITY)
Admission: EM | Admit: 2023-07-04 | Discharge: 2023-07-04 | Disposition: A | Discharge status: Home / Self Care | Payer: Medicare HMO | Attending: Emergency Medicine | Admitting: Emergency Medicine

## 2023-07-04 ENCOUNTER — Encounter (HOSPITAL_COMMUNITY): Payer: Self-pay

## 2023-07-04 ENCOUNTER — Other Ambulatory Visit: Payer: Self-pay

## 2023-07-04 DIAGNOSIS — Z7901 Long term (current) use of anticoagulants: Secondary | ICD-10-CM | POA: Diagnosis not present

## 2023-07-04 DIAGNOSIS — I110 Hypertensive heart disease with heart failure: Secondary | ICD-10-CM | POA: Diagnosis not present

## 2023-07-04 DIAGNOSIS — Z87891 Personal history of nicotine dependence: Secondary | ICD-10-CM | POA: Insufficient documentation

## 2023-07-04 DIAGNOSIS — K859 Acute pancreatitis without necrosis or infection, unspecified: Secondary | ICD-10-CM | POA: Diagnosis not present

## 2023-07-04 DIAGNOSIS — Z79899 Other long term (current) drug therapy: Secondary | ICD-10-CM | POA: Diagnosis not present

## 2023-07-04 DIAGNOSIS — R109 Unspecified abdominal pain: Secondary | ICD-10-CM | POA: Diagnosis present

## 2023-07-04 DIAGNOSIS — I5022 Chronic systolic (congestive) heart failure: Secondary | ICD-10-CM | POA: Diagnosis not present

## 2023-07-04 DIAGNOSIS — K861 Other chronic pancreatitis: Secondary | ICD-10-CM | POA: Diagnosis not present

## 2023-07-04 LAB — CBC WITH DIFFERENTIAL/PLATELET
Abs Immature Granulocytes: 0.02 10*3/uL (ref 0.00–0.07)
Basophils Absolute: 0.1 10*3/uL (ref 0.0–0.1)
Basophils Relative: 1 %
Eosinophils Absolute: 0.3 10*3/uL (ref 0.0–0.5)
Eosinophils Relative: 3 %
HCT: 40.1 % (ref 39.0–52.0)
Hemoglobin: 13.3 g/dL (ref 13.0–17.0)
Immature Granulocytes: 0 %
Lymphocytes Relative: 22 %
Lymphs Abs: 2.2 10*3/uL (ref 0.7–4.0)
MCH: 29 pg (ref 26.0–34.0)
MCHC: 33.2 g/dL (ref 30.0–36.0)
MCV: 87.6 fL (ref 80.0–100.0)
Monocytes Absolute: 0.7 10*3/uL (ref 0.1–1.0)
Monocytes Relative: 7 %
Neutro Abs: 6.7 10*3/uL (ref 1.7–7.7)
Neutrophils Relative %: 67 %
Platelets: 304 10*3/uL (ref 150–400)
RBC: 4.58 MIL/uL (ref 4.22–5.81)
RDW: 12.6 % (ref 11.5–15.5)
WBC: 10 10*3/uL (ref 4.0–10.5)
nRBC: 0 % (ref 0.0–0.2)

## 2023-07-04 LAB — COMPREHENSIVE METABOLIC PANEL
ALT: 34 U/L (ref 0–44)
AST: 23 U/L (ref 15–41)
Albumin: 3.7 g/dL (ref 3.5–5.0)
Alkaline Phosphatase: 40 U/L (ref 38–126)
Anion gap: 8 (ref 5–15)
BUN: 15 mg/dL (ref 6–20)
CO2: 24 mmol/L (ref 22–32)
Calcium: 8.1 mg/dL — ABNORMAL LOW (ref 8.9–10.3)
Chloride: 105 mmol/L (ref 98–111)
Creatinine, Ser: 0.61 mg/dL (ref 0.61–1.24)
GFR, Estimated: >60 mL/min (ref >60)
Glucose, Bld: 110 mg/dL — ABNORMAL HIGH (ref 70–99)
Potassium: 4 mmol/L (ref 3.5–5.1)
Sodium: 137 mmol/L (ref 135–145)
Total Bilirubin: 0.6 mg/dL (ref <1.2)
Total Protein: 6.9 g/dL (ref 6.5–8.1)

## 2023-07-04 LAB — URINALYSIS, ROUTINE W REFLEX MICROSCOPIC
Bilirubin Urine: NEGATIVE
Glucose, UA: NEGATIVE mg/dL
Hgb urine dipstick: NEGATIVE
Ketones, ur: NEGATIVE mg/dL
Leukocytes,Ua: NEGATIVE
Nitrite: NEGATIVE
Protein, ur: NEGATIVE mg/dL
Specific Gravity, Urine: 1.021 (ref 1.005–1.030)
pH: 6 (ref 5.0–8.0)

## 2023-07-04 LAB — LIPASE, BLOOD: Lipase: 1680 U/L — ABNORMAL HIGH (ref 11–51)

## 2023-07-04 LAB — ETHANOL: Alcohol, Ethyl (B): <10 mg/dL (ref <10)

## 2023-07-04 MED ORDER — HYDROMORPHONE HCL 1 MG/ML IJ SOLN
1.0000 mg | Freq: Once | INTRAMUSCULAR | Status: AC
  Administered 2023-07-04: 1 mg via INTRAVENOUS
  Filled 2023-07-04: qty 1

## 2023-07-04 MED ORDER — ONDANSETRON HCL 4 MG/2ML IJ SOLN
4.0000 mg | Freq: Once | INTRAMUSCULAR | Status: AC
Start: 2023-07-04 — End: 2023-07-04
  Administered 2023-07-04: 4 mg via INTRAVENOUS
  Filled 2023-07-04: qty 2

## 2023-07-04 MED ORDER — PANTOPRAZOLE SODIUM 40 MG IV SOLR
40.0000 mg | Freq: Once | INTRAVENOUS | Status: AC
  Administered 2023-07-04: 40 mg via INTRAVENOUS
  Filled 2023-07-04: qty 10

## 2023-07-04 MED ORDER — ONDANSETRON 4 MG PO TBDP: 4.0000 mg | ORAL_TABLET | ORAL | 0 refills | Status: AC | PRN

## 2023-07-04 MED ORDER — SODIUM CHLORIDE 0.9 % IV BOLUS
1000.0000 mL | Freq: Once | INTRAVENOUS | Status: AC
  Administered 2023-07-04: 1000 mL via INTRAVENOUS

## 2023-07-04 MED ORDER — HYDROMORPHONE HCL 1 MG/ML IJ SOLN
0.5000 mg | Freq: Once | INTRAMUSCULAR | Status: AC
  Administered 2023-07-04: 0.5 mg via INTRAVENOUS
  Filled 2023-07-04: qty 0.5

## 2023-07-04 MED ORDER — HYDROCODONE-ACETAMINOPHEN 5-325 MG PO TABS: 1.0000 | ORAL_TABLET | ORAL | 0 refills | Status: AC | PRN

## 2023-07-04 MED ORDER — KETOROLAC TROMETHAMINE 15 MG/ML IJ SOLN
15.0000 mg | Freq: Once | INTRAMUSCULAR | Status: AC
  Administered 2023-07-04: 15 mg via INTRAVENOUS
  Filled 2023-07-04: qty 1

## 2023-07-04 NOTE — ED Triage Notes (Addendum)
Pt arrived via POV c/o epigastric abdominal pain more in RUQ. Pt reports pain woke him from sleep. Pt endorses Hx of pancreatitis.

## 2023-07-04 NOTE — ED Provider Notes (Signed)
[place] EMERGENCY DEPARTMENT AT [facility] Provider Note  CSN: [id number] Arrival date & time: 07/04/23 1093  Chief Complaint(s) Abdominal Pain  HPI Nathaniel [name] is a 32 y.o. male with past medical history as below, significant for atrial fibrillation, dilated cardiomyopathy, depression, GERD, pancreatitis, obesity, cholecystectomy who presents to the ED with complaint of abdominal pain  He was awakened prior to arrival by epigastric abdominal pain, sharp, burning, stabbing.  Feels similar to prior episodes of pancreatitis.  No nausea or vomiting.  No alcohol use.  No change in bowel or bladder function.  Does report recent fatty food intake.  No fevers or chills, no change in bowel or bladder function.  Past Medical History Past Medical History:  Diagnosis Date   Acute pancreatitis without infection or necrosis 03/05/2016   Anginal pain (HCC)    Happens around moments of A.Fib   Anxiety    Arthritis    Back   Atrial fibrillation (HCC)    Atrial flutter (HCC) 01/17/2016   Admx 5/17 with AFlutter with RVR in setting of pancreatitis // CHADS2-VASc=1 (CHF) // Anticoag not started // Amiodarone >> NSR    Cardiomyopathy (HCC)    CHF (congestive heart failure) (HCC)    Chronic lower back pain    Chronic systolic CHF (congestive heart failure) (HCC)    Echo 01/17/16 - EF 35-40%, diffuse HK, mild LAE    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HCC) 02/03/2016   a. EF 35-40% in setting of AFlutter with RVR in 5/17 // probably tachycardia mediated // needs FU echo 2-3 mos after NSR restored // b. Limited Echo 7/17: EF 45-50%, diff HK, mild LAE   Depression    "last 2-3 months; never treated for it" (12/09/2016)   Dysrhythmia    A. Fib   Fatty liver 12/2015   per CT   Gastroenteritis 06/05/2017   GERD (gastroesophageal reflux disease)    Headache    "monthly" (12/09/2016)   Obesity    BMI 45 12/2015   Pancreatic pseudocyst    Pancreatitis 2012   recurrent acute. Admissions to  [facility], 12/2015 admissin to [facility] hospital, ct then shows pseudocyst.    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   Pancreatic divisum 05/11/2018   Acute pancreatitis 05/10/2018   Hypertension 05/10/2018   Acute on chronic pancreatitis (HCC) 08/06/2017   Obesity, Class III, BMI 40-49.9 (morbid obesity) (HCC) 08/06/2017   History of cardiomyopathy 06/05/2017   Hypokalemia 06/05/2017   Hypomagnesemia 06/05/2017   Paroxysmal atrial fibrillation (HCC) 12/09/2016   A-fib (HCC) 12/09/2016   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HCC) 02/03/2016   Chronic systolic CHF (congestive heart failure) (HCC)    Morbid obesity due to excess calories (HCC)    Dyslipidemia    History of pancreatitis 01/17/2016   Snoring 01/17/2016   Former smoker 01/17/2016   Alcohol abuse, 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 Authorizing Provider  acetaminophen (TYLENOL) 650 MG CR tablet Take 650 mg by mouth every 8 (eight) hours as needed for pain.   Yes [provider]  HYDROcodone-acetaminophen (NORCO) 5-325 MG tablet Take 1 tablet by mouth every 4 (four) hours as needed for moderate pain (pain score 4-6). 07/04/23  Yes Tanda Rockers A, DO  ondansetron (ZOFRAN-ODT) 4 MG disintegrating tablet Take 1 tablet (4 mg total) by mouth every 4 (four) hours as needed for nausea or vomiting. 07/04/23  Yes  Sloan Leiter, DO  apixaban (ELIQUIS) 5 MG TABS tablet Take 1 tablet (5 mg total) by mouth 2 (two) times daily. 02/08/23   Lanier Prude, MD  clonazePAM (KLONOPIN) 0.5 MG tablet Take 0.5 tablets (0.25 mg total) by mouth 2 (two) times daily. 07/12/22   Elsie Lincoln, MD  dofetilide (TIKOSYN) 500 MCG capsule Take 1 capsule (500 mcg total) by mouth 2 (two) times daily. 02/02/23   Sheilah Pigeon, PA-C  FLUoxetine (PROZAC) 10 MG capsule Take 10 mg by mouth daily. 06/23/23   [provider]  methocarbamol (ROBAXIN) 500 MG tablet Take 500 mg by mouth at bedtime as needed. 03/24/23   [provider]  metoprolol succinate (TOPROL-XL) 50 MG 24 hr tablet Take 1 tablet (50 mg total) by mouth daily at 8 pm. Take with or immediately following a meal. 01/27/23   Sheilah Pigeon, PA-C  Multiple Vitamin (ONE-A-DAY MENS PO) Take 1 tablet by mouth every morning.    [provider]  pantoprazole (PROTONIX) 40 MG tablet TAKE 1 TABLET(40 MG) BY MOUTH DAILY 12/21/19   Allred, Fayrene Fearing, MD  triamcinolone cream (KENALOG) 0.1 % Apply 1 application topically daily as needed (for irritation).     [provider]                                                                                                                                    Past Surgical History Past Surgical History:  Procedure Laterality Date   ATRIAL FIBRILLATION ABLATION N/A 12/09/2016   Procedure: Atrial Fibrillation Ablation;  Surgeon: Hillis Range, MD;  Location: [facility] INVASIVE CV LAB;  Service: Cardiovascular;  Laterality: N/A;   ATRIAL FIBRILLATION ABLATION N/A 11/09/2019   Procedure: ATRIAL FIBRILLATION ABLATION;  Surgeon: Hillis Range, MD;  Location: [facility] INVASIVE CV LAB;  Service: Cardiovascular;  Laterality: N/A;   CHOLECYSTECTOMY  2022   CLIPPING OF ATRIAL APPENDAGE N/A 10/18/2022   Procedure: CLIPPING OF LEFT ATRIAL APPENDAGE;  Surgeon: Lyn Hollingshead, MD;  Location: [facility] OR;  Service: Open Heart Surgery;   Laterality: N/A;   HAND SURGERY Right ~ 2008   "had pins in it; pulled them"   TEE WITHOUT CARDIOVERSION N/A 12/08/2016   Procedure: TRANSESOPHAGEAL ECHOCARDIOGRAM (TEE);  Surgeon: Jake Bathe, MD;  Location[facility] ENDOSCOPY;  Service: Cardiovascular;  Laterality: N/A;   TEE WITHOUT CARDIOVERSION N/A 10/18/2022   Procedure: TRANSESOPHAGEAL ECHOCARDIOGRAM;  Surgeon: Lyn Hollingshead,  MD;  Location: [facility] OR;  Service: Open Heart Surgery;  Laterality: N/A;   TONSILLECTOMY AND ADENOIDECTOMY     "in my teens"   Family History Family History  Problem Relation Age of Onset   Diabetes Mother    Gallbladder disease Mother    Hypertension Father    Supraventricular tachycardia Brother    Cancer Maternal Grandmother    Cancer Maternal Grandfather    Cirrhosis Maternal Grandfather    Stroke Paternal Grandmother    Celiac disease Neg Hx    Breast cancer Neg Hx    Cholelithiasis Neg Hx    Colon cancer Neg Hx    Colon polyps Neg Hx    Crohn's disease Neg Hx    Esophageal cancer Neg Hx    Hepatitis Neg Hx    Inflammatory bowel disease Neg Hx    Liver disease Neg Hx    Pancreatitis Neg Hx    Stomach cancer Neg Hx    Thyroid disease Neg Hx    Ulcerative colitis Neg Hx     Social History Social History   Tobacco Use   Smoking status: Former    Current packs/day: 0.00    Types: Cigarettes    Quit date: 2016    Years since quitting: 8.8   Smokeless tobacco: Current    Types: Snuff   Tobacco comments:    Nicotine patches sent 07/12/22  Vaping Use   Vaping status: Never Used  Substance Use Topics   Alcohol use: Not Currently    Comment: last drink 2018   Drug use: Not Currently    Types: Marijuana    Comment: 12/09/2016 "daily from age 49 to 18"   Allergies Morphine  Review of Systems Review of Systems  Constitutional:  Negative for chills and fever.  Respiratory:  Negative for chest tightness and shortness of breath.   Gastrointestinal:  Positive for abdominal pain. Negative for  nausea and vomiting.  Genitourinary:  Negative for dysuria and urgency.  Musculoskeletal:  Negative for back pain.  Skin:  Negative for rash.  Neurological:  Negative for headaches.  All other systems reviewed and are negative.   Physical Exam Vital Signs  I have reviewed the triage vital signs BP 133/69   Pulse 67   Temp 98.5 F (36.9 C) (Oral)   Resp 18   Ht 6' (1.829 m)   Wt (!) 149 kg   SpO2 95%   BMI 44.55 kg/m  Physical Exam Vitals and nursing note reviewed.  Constitutional:      General: He is not in acute distress.    Appearance: He is well-developed. He is obese.  HENT:     Head: Normocephalic and atraumatic.     Right Ear: External ear normal.     Left Ear: External ear normal.     Mouth/Throat:     Mouth: Mucous membranes are moist.  Eyes:     General: No scleral icterus. Cardiovascular:     Rate and Rhythm: Normal rate and regular rhythm.     Pulses: Normal pulses.     Heart sounds: Normal heart sounds.  Pulmonary:     Effort: Pulmonary effort is normal. No respiratory distress.     Breath sounds: Normal breath sounds.  Abdominal:     General: Abdomen is flat.     Palpations: Abdomen is soft.     Tenderness: There is abdominal tenderness in the right upper quadrant and epigastric area. There is no guarding or rebound.  Musculoskeletal:  Cervical back: No rigidity.     Right lower leg: No edema.     Left lower leg: No edema.  Skin:    General: Skin is warm and dry.     Capillary Refill: Capillary refill takes less than 2 seconds.  Neurological:     Mental Status: He is alert.  Psychiatric:        Mood and Affect: Mood normal.        Behavior: Behavior normal.     ED Results and Treatments Labs (all labs ordered are listed, but only abnormal results are displayed) Labs Reviewed  COMPREHENSIVE METABOLIC PANEL - Abnormal; Notable for the following components:      Result Value   Glucose, Bld 110 (*)    Calcium 8.1 (*)    All other  components within normal limits  LIPASE, BLOOD - Abnormal; Notable for the following components:   Lipase 1,680 (*)    All other components within normal limits  CBC WITH DIFFERENTIAL/PLATELET  URINALYSIS, ROUTINE W REFLEX MICROSCOPIC  ETHANOL                                                                                                                          Radiology No results found.  Pertinent labs & imaging results that were available during my care of the patient were reviewed by me and considered in my medical decision making (see MDM for details).  Medications Ordered in ED Medications  ondansetron (ZOFRAN) injection 4 mg (4 mg Intravenous Given 07/04/23 0420)  sodium chloride 0.9 % bolus 1,000 mL (0 mLs Intravenous Stopped 07/04/23 0552)  HYDROmorphone (DILAUDID) injection 1 mg (1 mg Intravenous Given 07/04/23 0420)  pantoprazole (PROTONIX) injection 40 mg (40 mg Intravenous Given 07/04/23 0420)  sodium chloride 0.9 % bolus 1,000 mL (0 mLs Intravenous Stopped 07/04/23 0617)  ketorolac (TORADOL) 15 MG/ML injection 15 mg (15 mg Intravenous Given 07/04/23 0510)  HYDROmorphone (DILAUDID) injection 0.5 mg (0.5 mg Intravenous Given 07/04/23 0510)                                                                                                                                     Procedures Procedures  (including critical care time)  Medical Decision Making / ED Course    Medical Decision Making:    Nathaniel [name] is a 32 y.o. male with past medical history as below, significant  for atrial fibrillation, dilated cardiomyopathy, depression, GERD, recurrent pancreatitis, pancreas divisum, annular pancreas, pancreatic pseudocyst, cholecystectomy who presents to the ED with complaint of abdominal painThe complaint involves an extensive differential diagnosis and also carries with it a high risk of complications and morbidity.  Serious etiology was considered. Ddx includes but is not  limited to: Differential diagnosis includes but is not exclusive to acute cholecystitis, intrathoracic causes for epigastric abdominal pain, gastritis, duodenitis, pancreatitis, small bowel or large bowel obstruction, abdominal aortic aneurysm, hernia, gastritis, etc.   Complete initial physical exam performed, notably the patient  was in NAD, abd not peritoneal.    Reviewed and confirmed nursing documentation for past medical history, family history, social history.  Vital signs reviewed.    Clinical Course as of 07/04/23 0981  [facility] Jul 04, 2023  0620 Symptoms improved, will trial po chall. Lipase still in process, lab reports having to dilute multiple times  [SG]  0636 Lipase(!): 1,680 Chronically elevated, pain resolved, tolerating PO.  [SG]  1914 Feeling better on recheck, tolerating PO w/o difficulty. Observation offered, he prefers to trial o/p management at home.  [SG]    Clinical Course User Index [SG] Sloan Leiter, DO     Patient reports pain is similar to prior episodes of pancreatitis.  Will get screening labs, give analgesics, fluids, antiemetic, Protonix. He follows with GI at [facility], Dr Boyd Kerbs  Labs reviewed, lipase is elevated.  He is feeling much better on recheck, he is tolerating p.o. without difficulty.  No fever.  Abdominal pain is subsided.  No nausea or vomiting.  He has no leukocytosis, is not septic.  He is nontoxic.  Low suspicion for infectious etiology.  Likely recurrence of pancreatitis.  Observation was offered however he prefers to trial outpatient management.  Will give analgesics and antiemetics for home.  Advised on follow-up with GI and with PCP.  Dietary recommendations given, avoidance of triggers   The patient improved significantly and was discharged in stable condition. Detailed discussions were had with the patient regarding current findings, and need for close f/u with PCP or on call doctor. The patient has been instructed to return immediately if the  symptoms worsen in any way for re-evaluation. Patient verbalized understanding and is in agreement with current care plan. All questions answered prior to discharge.                 Additional history obtained: -Additional history obtained from spouse -External records from outside source obtained and reviewed including: Chart review including previous notes, labs, imaging, consultation notes including  OSH records, home medications, prior labs and imaging   Lab Tests: -I ordered, reviewed, and interpreted labs.   The pertinent results include:   Labs Reviewed  COMPREHENSIVE METABOLIC PANEL - Abnormal; Notable for the following components:      Result Value   Glucose, Bld 110 (*)    Calcium 8.1 (*)    All other components within normal limits  LIPASE, BLOOD - Abnormal; Notable for the following components:   Lipase 1,680 (*)    All other components within normal limits  CBC WITH DIFFERENTIAL/PLATELET  URINALYSIS, ROUTINE W REFLEX MICROSCOPIC  ETHANOL    Notable for as above, lipase elevated  EKG   EKG Interpretation Date/Time:    Ventricular Rate:    PR Interval:    QRS Duration:    QT Interval:    QTC Calculation:   R Axis:      Text Interpretation:  Imaging Studies ordered: na   Medicines ordered and prescription drug management: Meds ordered this encounter  Medications   ondansetron (ZOFRAN) injection 4 mg   sodium chloride 0.9 % bolus 1,000 mL   HYDROmorphone (DILAUDID) injection 1 mg   pantoprazole (PROTONIX) injection 40 mg   sodium chloride 0.9 % bolus 1,000 mL   ketorolac (TORADOL) 15 MG/ML injection 15 mg   HYDROmorphone (DILAUDID) injection 0.5 mg   HYDROcodone-acetaminophen (NORCO) 5-325 MG tablet    Sig: Take 1 tablet by mouth every 4 (four) hours as needed for moderate pain (pain score 4-6).    Dispense:  10 tablet    Refill:  0   ondansetron (ZOFRAN-ODT) 4 MG disintegrating tablet    Sig: Take 1 tablet (4 mg total) by  mouth every 4 (four) hours as needed for nausea or vomiting.    Dispense:  10 tablet    Refill:  0    -I have reviewed the patients home medicines and have made adjustments as needed   Consultations Obtained: na   Cardiac Monitoring: Continuous pulse oximetry interpreted by myself, 98% on RA.    Social Determinants of Health:  Diagnosis or treatment significantly limited by social determinants of health: current smoker and obesity   Reevaluation: After the interventions noted above, I reevaluated the patient and found that they have improved  Co morbidities that complicate the patient evaluation  Past Medical History:  Diagnosis Date   Acute pancreatitis without infection or necrosis 03/05/2016   Anginal pain (HCC)    Happens around moments of A.Fib   Anxiety    Arthritis    Back   Atrial fibrillation (HCC)    Atrial flutter (HCC) 01/17/2016   Admx 5/17 with AFlutter with RVR in setting of pancreatitis // CHADS2-VASc=1 (CHF) // Anticoag not started // Amiodarone >> NSR    Cardiomyopathy (HCC)    CHF (congestive heart failure) (HCC)    Chronic lower back pain    Chronic systolic CHF (congestive heart failure) (HCC)    Echo 01/17/16 - EF 35-40%, diffuse HK, mild LAE    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HCC) 02/03/2016   a. EF 35-40% in setting of AFlutter with RVR in 5/17 // probably tachycardia mediated // needs FU echo 2-3 mos after NSR restored // b. Limited Echo 7/17: EF 45-50%, diff HK, mild LAE   Depression    "last 2-3 months; never treated for it" (12/09/2016)   Dysrhythmia    A. Fib   Fatty liver 12/2015   per CT   Gastroenteritis 06/05/2017   GERD (gastroesophageal reflux disease)    Headache    "monthly" (12/09/2016)   Obesity    BMI 45 12/2015   Pancreatic pseudocyst    Pancreatitis 2012   recurrent acute. Admissions to [facility], 12/2015 admissin to [facility] hospital, ct then shows pseudocyst.    Pancreatitis 2012 X 2; 2017 X 2   Pancreatitis 03/05/2016   Paroxysmal  atrial fibrillation (HCC)    Typical atrial flutter (HCC) 06/06/2017      Dispostion: Disposition decision including need for hospitalization was considered, and patient discharged from emergency department.    Final Clinical Impression(s) / ED Diagnoses Final diagnoses:  Acute on chronic pancreatitis (HCC)        Sloan Leiter, DO 07/04/23 [phone]

## 2023-07-04 NOTE — Discharge Instructions (Addendum)
It was a pleasure caring for you today in the emergency department.  Please follow clear liquid diet as outlined on handout.  Plenty of fluids over the next few days.  Please follow-up with your gastroenterologist and primary care doctor  Please return to the emergency department for any worsening or worrisome symptoms.

## 2023-07-04 NOTE — ED Notes (Signed)
Water given for PO challenge.

## 2023-07-11 DIAGNOSIS — M62830 Muscle spasm of back: Secondary | ICD-10-CM | POA: Diagnosis not present

## 2023-07-11 DIAGNOSIS — M9903 Segmental and somatic dysfunction of lumbar region: Secondary | ICD-10-CM | POA: Diagnosis not present

## 2023-07-11 DIAGNOSIS — M9905 Segmental and somatic dysfunction of pelvic region: Secondary | ICD-10-CM | POA: Diagnosis not present

## 2023-07-11 DIAGNOSIS — K859 Acute pancreatitis without necrosis or infection, unspecified: Secondary | ICD-10-CM | POA: Diagnosis not present

## 2023-07-11 DIAGNOSIS — M546 Pain in thoracic spine: Secondary | ICD-10-CM | POA: Diagnosis not present

## 2023-07-11 DIAGNOSIS — Z6841 Body Mass Index (BMI) 40.0 and over, adult: Secondary | ICD-10-CM | POA: Diagnosis not present

## 2023-07-11 DIAGNOSIS — M9902 Segmental and somatic dysfunction of thoracic region: Secondary | ICD-10-CM | POA: Diagnosis not present

## 2023-07-26 ENCOUNTER — Ambulatory Visit: Payer: Medicare HMO

## 2023-08-02 ENCOUNTER — Ambulatory Visit: Payer: Medicare HMO | Attending: Internal Medicine

## 2023-08-02 DIAGNOSIS — I4819 Other persistent atrial fibrillation: Secondary | ICD-10-CM | POA: Diagnosis not present

## 2023-08-02 NOTE — Progress Notes (Signed)
Patient here for EKG. Updated Prozac to 20 mg daily. No other changes. Forwarded EKG to provider

## 2023-08-08 DIAGNOSIS — M9905 Segmental and somatic dysfunction of pelvic region: Secondary | ICD-10-CM | POA: Diagnosis not present

## 2023-08-08 DIAGNOSIS — M9903 Segmental and somatic dysfunction of lumbar region: Secondary | ICD-10-CM | POA: Diagnosis not present

## 2023-08-08 DIAGNOSIS — M62830 Muscle spasm of back: Secondary | ICD-10-CM | POA: Diagnosis not present

## 2023-08-08 DIAGNOSIS — M9902 Segmental and somatic dysfunction of thoracic region: Secondary | ICD-10-CM | POA: Diagnosis not present

## 2023-08-08 DIAGNOSIS — M546 Pain in thoracic spine: Secondary | ICD-10-CM | POA: Diagnosis not present

## 2023-08-23 ENCOUNTER — Other Ambulatory Visit: Payer: Self-pay | Admitting: Pharmacist

## 2023-08-23 DIAGNOSIS — I480 Paroxysmal atrial fibrillation: Secondary | ICD-10-CM

## 2023-08-23 MED ORDER — APIXABAN 5 MG PO TABS: 5.0000 mg | ORAL_TABLET | Freq: Two times a day (BID) | ORAL | 5 refills | Status: DC

## 2023-09-05 DIAGNOSIS — M62830 Muscle spasm of back: Secondary | ICD-10-CM | POA: Diagnosis not present

## 2023-09-05 DIAGNOSIS — M9902 Segmental and somatic dysfunction of thoracic region: Secondary | ICD-10-CM | POA: Diagnosis not present

## 2023-09-05 DIAGNOSIS — M9903 Segmental and somatic dysfunction of lumbar region: Secondary | ICD-10-CM | POA: Diagnosis not present

## 2023-09-05 DIAGNOSIS — M9905 Segmental and somatic dysfunction of pelvic region: Secondary | ICD-10-CM | POA: Diagnosis not present

## 2023-09-05 DIAGNOSIS — M546 Pain in thoracic spine: Secondary | ICD-10-CM | POA: Diagnosis not present

## 2023-09-09 ENCOUNTER — Encounter: Payer: Self-pay | Admitting: Cardiology

## 2023-09-09 DIAGNOSIS — I4819 Other persistent atrial fibrillation: Secondary | ICD-10-CM

## 2023-09-22 DIAGNOSIS — H93239 Hyperacusis, unspecified ear: Secondary | ICD-10-CM | POA: Diagnosis not present

## 2023-09-22 DIAGNOSIS — I4891 Unspecified atrial fibrillation: Secondary | ICD-10-CM | POA: Diagnosis not present

## 2023-09-22 DIAGNOSIS — H9311 Tinnitus, right ear: Secondary | ICD-10-CM | POA: Diagnosis not present

## 2023-09-23 ENCOUNTER — Ambulatory Visit (HOSPITAL_COMMUNITY)
Admission: RE | Admit: 2023-09-23 | Discharge: 2023-09-23 | Disposition: A | Discharge status: Home / Self Care | Payer: Medicare HMO | Source: Ambulatory Visit | Attending: Cardiology

## 2023-09-23 DIAGNOSIS — I4819 Other persistent atrial fibrillation: Secondary | ICD-10-CM | POA: Insufficient documentation

## 2023-09-23 MED ORDER — IOHEXOL 350 MG/ML SOLN
95.0000 mL | Freq: Once | INTRAVENOUS | Status: AC | PRN
  Administered 2023-09-23: 95 mL via INTRAVENOUS

## 2023-10-05 DIAGNOSIS — M62830 Muscle spasm of back: Secondary | ICD-10-CM | POA: Diagnosis not present

## 2023-10-05 DIAGNOSIS — M9902 Segmental and somatic dysfunction of thoracic region: Secondary | ICD-10-CM | POA: Diagnosis not present

## 2023-10-05 DIAGNOSIS — M9905 Segmental and somatic dysfunction of pelvic region: Secondary | ICD-10-CM | POA: Diagnosis not present

## 2023-10-05 DIAGNOSIS — M9903 Segmental and somatic dysfunction of lumbar region: Secondary | ICD-10-CM | POA: Diagnosis not present

## 2023-10-05 DIAGNOSIS — M546 Pain in thoracic spine: Secondary | ICD-10-CM | POA: Diagnosis not present

## 2023-10-11 NOTE — Progress Notes (Unsigned)
  Electrophysiology Office Follow up Visit Note:    Date:  10/12/2023   ID:  Nathaniel [name], DOB 05/04/1991, MRN [id number]  PCP:  Toma Deiters, MD  [facility] Cardiologist:  Lanier Prude, MD  [facility] Electrophysiologist:  Lanier Prude, MD    Interval History:     Nathaniel [name] is a 33 y.o. male who presents for a follow up visit.   The patient last saw Mardelle Matte in clinic July 01, 2023.  The patient has a history of convergent ablation and left atrial appendage clipping.  He has been maintained on Tikosyn 500 mcg by mouth twice daily.  He has been on Eliquis for stroke prophylaxis.  At the last appoint with Mardelle Matte he requested to stop his medications.  It was decided to scan his chest to make sure the left atrial appendage clip was successful.  He presents today for follow-up.      Past medical, surgical, social and family history were reviewed.  ROS:   Please see the history of present illness.    All other systems reviewed and are negative.  EKGs/Labs/Other Studies Reviewed:    The following studies were reviewed today:  September 24, 2023 CT cardiac Surgical left atrial appendage clipping without residual patency   EKG Interpretation Date/Time:  Wednesday October 12 2023 14:25:50 EST Ventricular Rate:  72 PR Interval:  148 QRS Duration:  86 QT Interval:  388 QTC Calculation: 424 R Axis:   22  Text Interpretation: Normal sinus rhythm Confirmed by Steffanie Dunn [phone] on 10/12/2023 2:54:36 PM    Physical Exam:    VS:  BP 138/88   Pulse 76   Ht 6' (1.829 m)   Wt (!) 334 lb 6.4 oz (151.7 kg)   SpO2 96%   BMI 45.35 kg/m     Wt Readings from Last 3 Encounters:  10/12/23 (!) 334 lb 6.4 oz (151.7 kg)  07/04/23 (!) 328 lb 7.8 oz (149 kg)  07/01/23 (!) 326 lb 12.8 oz (148.2 kg)     GEN: no distress CARD: RRR, No MRG RESP: No IWOB. CTAB.      ASSESSMENT:    1. Primary hypertension   2. Persistent atrial fibrillation (HCC)    3. Encounter for long-term (current) use of high-risk medication    PLAN:    In order of problems listed above:  #Persistent atrial fibrillation #High risk med monitoring-Tikosyn The patient is doing well after maze and left atrial appendage clip.  His appendage was successfully excluded during the clip procedure.  I discussed Tikosyn and anticoagulation at today's clinic appointment.  After our discussion, the patient would like to stop his anticoagulant which I think is reasonable.  He should take aspirin 81 mg by mouth once daily  Also discussed his Tikosyn during today's clinic appointment.  Today's EKG shows sinus rhythm with a QTc acceptable for ongoing Tikosyn use.  He would like to stop Tikosyn which I think is reasonable.  He understands that he may return to atrial fibrillation and need Tikosyn reloading in the hospital.  Follow-up with EP APP in 6 months.     Signed, Steffanie Dunn, MD, [facility], [facility] 10/12/2023 3:04 PM    Electrophysiology [facility]

## 2023-10-12 ENCOUNTER — Encounter: Payer: Self-pay | Admitting: Cardiology

## 2023-10-12 ENCOUNTER — Ambulatory Visit: Payer: Medicare HMO | Attending: Cardiology | Admitting: Cardiology

## 2023-10-12 VITALS — BP 138/88 | HR 76 | Ht 72.0 in | Wt 334.4 lb

## 2023-10-12 DIAGNOSIS — I10 Essential (primary) hypertension: Secondary | ICD-10-CM

## 2023-10-12 DIAGNOSIS — Z79899 Other long term (current) drug therapy: Secondary | ICD-10-CM

## 2023-10-12 DIAGNOSIS — I4819 Other persistent atrial fibrillation: Secondary | ICD-10-CM | POA: Diagnosis not present

## 2023-10-12 MED ORDER — ASPIRIN 81 MG PO TBEC: 81.0000 mg | DELAYED_RELEASE_TABLET | Freq: Every day | ORAL | Status: AC

## 2023-10-12 NOTE — Patient Instructions (Signed)
Medication Instructions:  Your physician has recommended you make the following change in your medication:  1) STOP taking Eliquis and Tikosyn  2) START taking aspirin 81mg  daily  *If you need a refill on your cardiac medications before your next appointment, please call your pharmacy  Follow-Up: At [facility], you and your health needs are our priority.  As part of our continuing mission to provide you with exceptional heart care, we have created designated Provider Care Teams.  These Care Teams include your primary Cardiologist (physician) and Advanced Practice Providers (APPs -  Physician Assistants and Nurse Practitioners) who all work together to provide you with the care you need, when you need it.   Your next appointment:   6 months  Provider:   You will see one of the following Advanced Practice Providers on your designated Care Team:   Francis Dowse, Charlott Holler [address]" [place], [place] Sherie Don, NP Canary Brim, NP

## 2023-11-02 DIAGNOSIS — M9905 Segmental and somatic dysfunction of pelvic region: Secondary | ICD-10-CM | POA: Diagnosis not present

## 2023-11-02 DIAGNOSIS — M9902 Segmental and somatic dysfunction of thoracic region: Secondary | ICD-10-CM | POA: Diagnosis not present

## 2023-11-02 DIAGNOSIS — M546 Pain in thoracic spine: Secondary | ICD-10-CM | POA: Diagnosis not present

## 2023-11-02 DIAGNOSIS — M9903 Segmental and somatic dysfunction of lumbar region: Secondary | ICD-10-CM | POA: Diagnosis not present

## 2023-11-02 DIAGNOSIS — M62830 Muscle spasm of back: Secondary | ICD-10-CM | POA: Diagnosis not present

## 2023-11-07 DIAGNOSIS — F411 Generalized anxiety disorder: Secondary | ICD-10-CM | POA: Diagnosis not present

## 2023-11-07 DIAGNOSIS — Z6841 Body Mass Index (BMI) 40.0 and over, adult: Secondary | ICD-10-CM | POA: Diagnosis not present

## 2023-11-07 DIAGNOSIS — Z0000 Encounter for general adult medical examination without abnormal findings: Secondary | ICD-10-CM | POA: Diagnosis not present

## 2023-11-07 DIAGNOSIS — I480 Paroxysmal atrial fibrillation: Secondary | ICD-10-CM | POA: Diagnosis not present

## 2023-11-07 DIAGNOSIS — K859 Acute pancreatitis without necrosis or infection, unspecified: Secondary | ICD-10-CM | POA: Diagnosis not present

## 2023-11-07 DIAGNOSIS — R0602 Shortness of breath: Secondary | ICD-10-CM | POA: Diagnosis not present

## 2023-11-07 DIAGNOSIS — I5032 Chronic diastolic (congestive) heart failure: Secondary | ICD-10-CM | POA: Diagnosis not present

## 2023-11-07 DIAGNOSIS — K861 Other chronic pancreatitis: Secondary | ICD-10-CM | POA: Diagnosis not present

## 2023-11-07 DIAGNOSIS — I10 Essential (primary) hypertension: Secondary | ICD-10-CM | POA: Diagnosis not present

## 2023-11-07 DIAGNOSIS — E66813 Obesity, class 3: Secondary | ICD-10-CM | POA: Diagnosis not present

## 2023-11-07 DIAGNOSIS — K2970 Gastritis, unspecified, without bleeding: Secondary | ICD-10-CM | POA: Diagnosis not present

## 2023-11-30 ENCOUNTER — Encounter: Payer: Self-pay | Admitting: Cardiology

## 2023-11-30 ENCOUNTER — Other Ambulatory Visit (HOSPITAL_COMMUNITY): Payer: Self-pay

## 2023-11-30 DIAGNOSIS — M546 Pain in thoracic spine: Secondary | ICD-10-CM | POA: Diagnosis not present

## 2023-11-30 DIAGNOSIS — M9902 Segmental and somatic dysfunction of thoracic region: Secondary | ICD-10-CM | POA: Diagnosis not present

## 2023-11-30 DIAGNOSIS — M9905 Segmental and somatic dysfunction of pelvic region: Secondary | ICD-10-CM | POA: Diagnosis not present

## 2023-11-30 DIAGNOSIS — M62830 Muscle spasm of back: Secondary | ICD-10-CM | POA: Diagnosis not present

## 2023-11-30 DIAGNOSIS — M9903 Segmental and somatic dysfunction of lumbar region: Secondary | ICD-10-CM | POA: Diagnosis not present

## 2023-11-30 MED ORDER — METOPROLOL SUCCINATE ER 25 MG PO TB24
25.0000 mg | ORAL_TABLET | Freq: Every evening | ORAL | 3 refills | Status: DC
  Filled 2023-11-30: qty 90, 90d supply, fill #0

## 2023-12-12 ENCOUNTER — Other Ambulatory Visit (HOSPITAL_COMMUNITY): Payer: Self-pay

## 2023-12-13 ENCOUNTER — Other Ambulatory Visit: Payer: Self-pay

## 2023-12-13 MED ORDER — METOPROLOL SUCCINATE ER 25 MG PO TB24: 25.0000 mg | ORAL_TABLET | Freq: Every evening | ORAL | 3 refills | Status: AC

## 2023-12-28 DIAGNOSIS — M9903 Segmental and somatic dysfunction of lumbar region: Secondary | ICD-10-CM | POA: Diagnosis not present

## 2023-12-28 DIAGNOSIS — M9905 Segmental and somatic dysfunction of pelvic region: Secondary | ICD-10-CM | POA: Diagnosis not present

## 2023-12-28 DIAGNOSIS — M9902 Segmental and somatic dysfunction of thoracic region: Secondary | ICD-10-CM | POA: Diagnosis not present

## 2023-12-28 DIAGNOSIS — M62830 Muscle spasm of back: Secondary | ICD-10-CM | POA: Diagnosis not present

## 2023-12-28 DIAGNOSIS — M546 Pain in thoracic spine: Secondary | ICD-10-CM | POA: Diagnosis not present

## 2024-01-18 DIAGNOSIS — M9903 Segmental and somatic dysfunction of lumbar region: Secondary | ICD-10-CM | POA: Diagnosis not present

## 2024-01-18 DIAGNOSIS — M9905 Segmental and somatic dysfunction of pelvic region: Secondary | ICD-10-CM | POA: Diagnosis not present

## 2024-01-18 DIAGNOSIS — M9902 Segmental and somatic dysfunction of thoracic region: Secondary | ICD-10-CM | POA: Diagnosis not present

## 2024-01-18 DIAGNOSIS — M62830 Muscle spasm of back: Secondary | ICD-10-CM | POA: Diagnosis not present

## 2024-01-18 DIAGNOSIS — M546 Pain in thoracic spine: Secondary | ICD-10-CM | POA: Diagnosis not present

## 2024-02-01 DIAGNOSIS — M9905 Segmental and somatic dysfunction of pelvic region: Secondary | ICD-10-CM | POA: Diagnosis not present

## 2024-02-01 DIAGNOSIS — M9903 Segmental and somatic dysfunction of lumbar region: Secondary | ICD-10-CM | POA: Diagnosis not present

## 2024-02-01 DIAGNOSIS — M9902 Segmental and somatic dysfunction of thoracic region: Secondary | ICD-10-CM | POA: Diagnosis not present

## 2024-02-01 DIAGNOSIS — M62830 Muscle spasm of back: Secondary | ICD-10-CM | POA: Diagnosis not present

## 2024-02-01 DIAGNOSIS — M546 Pain in thoracic spine: Secondary | ICD-10-CM | POA: Diagnosis not present

## 2024-02-29 DIAGNOSIS — M546 Pain in thoracic spine: Secondary | ICD-10-CM | POA: Diagnosis not present

## 2024-02-29 DIAGNOSIS — M62830 Muscle spasm of back: Secondary | ICD-10-CM | POA: Diagnosis not present

## 2024-02-29 DIAGNOSIS — M9903 Segmental and somatic dysfunction of lumbar region: Secondary | ICD-10-CM | POA: Diagnosis not present

## 2024-02-29 DIAGNOSIS — M9905 Segmental and somatic dysfunction of pelvic region: Secondary | ICD-10-CM | POA: Diagnosis not present

## 2024-02-29 DIAGNOSIS — M9902 Segmental and somatic dysfunction of thoracic region: Secondary | ICD-10-CM | POA: Diagnosis not present

## 2024-03-05 DIAGNOSIS — I480 Paroxysmal atrial fibrillation: Secondary | ICD-10-CM | POA: Diagnosis not present

## 2024-03-05 DIAGNOSIS — K861 Other chronic pancreatitis: Secondary | ICD-10-CM | POA: Diagnosis not present

## 2024-03-05 DIAGNOSIS — I10 Essential (primary) hypertension: Secondary | ICD-10-CM | POA: Diagnosis not present

## 2024-03-05 DIAGNOSIS — E66813 Obesity, class 3: Secondary | ICD-10-CM | POA: Diagnosis not present

## 2024-03-05 DIAGNOSIS — I5032 Chronic diastolic (congestive) heart failure: Secondary | ICD-10-CM | POA: Diagnosis not present

## 2024-03-05 DIAGNOSIS — Z6841 Body Mass Index (BMI) 40.0 and over, adult: Secondary | ICD-10-CM | POA: Diagnosis not present

## 2024-03-05 DIAGNOSIS — F411 Generalized anxiety disorder: Secondary | ICD-10-CM | POA: Diagnosis not present

## 2024-03-28 DIAGNOSIS — M546 Pain in thoracic spine: Secondary | ICD-10-CM | POA: Diagnosis not present

## 2024-03-28 DIAGNOSIS — M62830 Muscle spasm of back: Secondary | ICD-10-CM | POA: Diagnosis not present

## 2024-03-28 DIAGNOSIS — M9903 Segmental and somatic dysfunction of lumbar region: Secondary | ICD-10-CM | POA: Diagnosis not present

## 2024-03-28 DIAGNOSIS — M9905 Segmental and somatic dysfunction of pelvic region: Secondary | ICD-10-CM | POA: Diagnosis not present

## 2024-03-28 DIAGNOSIS — M9902 Segmental and somatic dysfunction of thoracic region: Secondary | ICD-10-CM | POA: Diagnosis not present

## 2024-04-25 DIAGNOSIS — M62830 Muscle spasm of back: Secondary | ICD-10-CM | POA: Diagnosis not present

## 2024-04-25 DIAGNOSIS — M546 Pain in thoracic spine: Secondary | ICD-10-CM | POA: Diagnosis not present

## 2024-04-25 DIAGNOSIS — M9903 Segmental and somatic dysfunction of lumbar region: Secondary | ICD-10-CM | POA: Diagnosis not present

## 2024-04-25 DIAGNOSIS — M9902 Segmental and somatic dysfunction of thoracic region: Secondary | ICD-10-CM | POA: Diagnosis not present

## 2024-04-25 DIAGNOSIS — M9905 Segmental and somatic dysfunction of pelvic region: Secondary | ICD-10-CM | POA: Diagnosis not present

## 2024-05-18 DIAGNOSIS — M9903 Segmental and somatic dysfunction of lumbar region: Secondary | ICD-10-CM | POA: Diagnosis not present

## 2024-05-18 DIAGNOSIS — M9902 Segmental and somatic dysfunction of thoracic region: Secondary | ICD-10-CM | POA: Diagnosis not present

## 2024-05-18 DIAGNOSIS — M546 Pain in thoracic spine: Secondary | ICD-10-CM | POA: Diagnosis not present

## 2024-05-18 DIAGNOSIS — M62830 Muscle spasm of back: Secondary | ICD-10-CM | POA: Diagnosis not present

## 2024-05-18 DIAGNOSIS — M9905 Segmental and somatic dysfunction of pelvic region: Secondary | ICD-10-CM | POA: Diagnosis not present

## 2024-05-23 DIAGNOSIS — M9902 Segmental and somatic dysfunction of thoracic region: Secondary | ICD-10-CM | POA: Diagnosis not present

## 2024-05-23 DIAGNOSIS — M62830 Muscle spasm of back: Secondary | ICD-10-CM | POA: Diagnosis not present

## 2024-05-23 DIAGNOSIS — M546 Pain in thoracic spine: Secondary | ICD-10-CM | POA: Diagnosis not present

## 2024-05-23 DIAGNOSIS — M9903 Segmental and somatic dysfunction of lumbar region: Secondary | ICD-10-CM | POA: Diagnosis not present

## 2024-05-23 DIAGNOSIS — M9905 Segmental and somatic dysfunction of pelvic region: Secondary | ICD-10-CM | POA: Diagnosis not present

## 2024-06-20 DIAGNOSIS — M546 Pain in thoracic spine: Secondary | ICD-10-CM | POA: Diagnosis not present

## 2024-06-20 DIAGNOSIS — M9905 Segmental and somatic dysfunction of pelvic region: Secondary | ICD-10-CM | POA: Diagnosis not present

## 2024-06-20 DIAGNOSIS — M9902 Segmental and somatic dysfunction of thoracic region: Secondary | ICD-10-CM | POA: Diagnosis not present

## 2024-06-20 DIAGNOSIS — M62830 Muscle spasm of back: Secondary | ICD-10-CM | POA: Diagnosis not present

## 2024-06-20 DIAGNOSIS — M9903 Segmental and somatic dysfunction of lumbar region: Secondary | ICD-10-CM | POA: Diagnosis not present

## 2024-07-09 DIAGNOSIS — F411 Generalized anxiety disorder: Secondary | ICD-10-CM | POA: Diagnosis not present

## 2024-07-09 DIAGNOSIS — E66813 Obesity, class 3: Secondary | ICD-10-CM | POA: Diagnosis not present

## 2024-07-09 DIAGNOSIS — I5032 Chronic diastolic (congestive) heart failure: Secondary | ICD-10-CM | POA: Diagnosis not present

## 2024-07-09 DIAGNOSIS — I480 Paroxysmal atrial fibrillation: Secondary | ICD-10-CM | POA: Diagnosis not present

## 2024-07-09 DIAGNOSIS — Z6841 Body Mass Index (BMI) 40.0 and over, adult: Secondary | ICD-10-CM | POA: Diagnosis not present

## 2024-07-09 DIAGNOSIS — I10 Essential (primary) hypertension: Secondary | ICD-10-CM | POA: Diagnosis not present

## 2024-07-09 DIAGNOSIS — K861 Other chronic pancreatitis: Secondary | ICD-10-CM | POA: Diagnosis not present

## 2024-07-18 DIAGNOSIS — M9903 Segmental and somatic dysfunction of lumbar region: Secondary | ICD-10-CM | POA: Diagnosis not present

## 2024-07-18 DIAGNOSIS — M62830 Muscle spasm of back: Secondary | ICD-10-CM | POA: Diagnosis not present

## 2024-07-18 DIAGNOSIS — M9905 Segmental and somatic dysfunction of pelvic region: Secondary | ICD-10-CM | POA: Diagnosis not present

## 2024-07-18 DIAGNOSIS — M546 Pain in thoracic spine: Secondary | ICD-10-CM | POA: Diagnosis not present

## 2024-07-18 DIAGNOSIS — M9902 Segmental and somatic dysfunction of thoracic region: Secondary | ICD-10-CM | POA: Diagnosis not present

## 2024-07-22 ENCOUNTER — Other Ambulatory Visit: Payer: Self-pay

## 2024-07-22 ENCOUNTER — Encounter (HOSPITAL_COMMUNITY): Payer: Self-pay | Admitting: *Deleted

## 2024-07-22 ENCOUNTER — Emergency Department (HOSPITAL_COMMUNITY)
Admission: EM | Admit: 2024-07-22 | Discharge: 2024-07-22 | Disposition: A | Discharge status: Home / Self Care | Attending: Emergency Medicine | Admitting: Emergency Medicine

## 2024-07-22 DIAGNOSIS — Z7982 Long term (current) use of aspirin: Secondary | ICD-10-CM | POA: Insufficient documentation

## 2024-07-22 DIAGNOSIS — R1013 Epigastric pain: Secondary | ICD-10-CM | POA: Diagnosis present

## 2024-07-22 DIAGNOSIS — K861 Other chronic pancreatitis: Secondary | ICD-10-CM | POA: Insufficient documentation

## 2024-07-22 MED ORDER — MELOXICAM 15 MG PO TABS: 15.0000 mg | ORAL_TABLET | Freq: Every day | ORAL | 0 refills | Status: AC

## 2024-07-22 MED ORDER — KETOROLAC TROMETHAMINE 30 MG/ML IJ SOLN
30.0000 mg | Freq: Once | INTRAMUSCULAR | Status: AC
Start: 2024-07-22 — End: 2024-07-22
  Administered 2024-07-22: 30 mg via INTRAVENOUS
  Filled 2024-07-22: qty 1

## 2024-07-22 MED ORDER — SODIUM CHLORIDE 0.9 % IV BOLUS
2000.0000 mL | Freq: Once | INTRAVENOUS | Status: AC
  Administered 2024-07-22: 2000 mL via INTRAVENOUS

## 2024-07-22 MED ORDER — ONDANSETRON HCL 4 MG PO TABS: 4.0000 mg | ORAL_TABLET | Freq: Four times a day (QID) | ORAL | 0 refills | Status: AC

## 2024-07-22 NOTE — ED Triage Notes (Signed)
 Pt with c/o abd pain off and on x 14 hours, denies any N/V/D

## 2024-07-22 NOTE — Discharge Instructions (Signed)
 Prescribed Zofran  which can be taken every 6 hours as needed for nausea, Mobic  which can be taken once a day for pain, please take your diet very slow and start with clear liquids for 2 days then start to progress very slowly.  If you develop severe worsening pain nausea fevers or any other worsening symptoms return to the ER.  Thank you for allowing us  to treat you in the emergency department today.  After reviewing your examination and potential testing that was done it appears that you are safe to go home.  I would like for you to follow-up with your doctor within the next several days, have them obtain your records and follow-up with them to review all potential tests and results from your visit.  If you should develop severe or worsening symptoms return to the emergency department immediately

## 2024-07-22 NOTE — ED Provider Notes (Signed)
 [place] EMERGENCY DEPARTMENT AT [facility] Provider Note   CSN: [id number] Arrival date & time: 07/22/24  1226     Patient presents with: Abdominal Pain   Nathaniel [name] is a 33 y.o. male.    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  He reports a history of frequent pancreatitis which occurs a couple times a year which usually feels like this.  He tried to drink some water this morning it made the pain a little bit worse so he decided to come in.  He declines pain medicines at this time but feels like he would benefit from IV fluids.  There is no nausea or vomiting no changes in bowel habits, no fevers or chills, nothing else is different.  Prior to Admission medications   Medication Sig Start Date End Date Taking? Authorizing Provider  meloxicam  (MOBIC ) 15 MG tablet Take 1 tablet (15 mg total) by mouth daily for 14 days. 07/22/24 08/05/24 Yes Cleotilde Rogue, MD  ondansetron  (ZOFRAN ) 4 MG tablet Take 1 tablet (4 mg total) by mouth every 6 (six) hours. 07/22/24  Yes Cleotilde Rogue, MD  acetaminophen  (TYLENOL ) 650 MG CR tablet Take 650 mg by mouth every 8 (eight) hours as needed for pain.    [provider]  aspirin  EC 81 MG tablet Take 1 tablet (81 mg total) by mouth daily. Swallow whole. 10/12/23   Cindie Ole ONEIDA, MD  clonazePAM  (KLONOPIN ) 0.5 MG tablet Take 0.5 tablets (0.25 mg total) by mouth 2 (two) times daily. 07/12/22   Barbra Jayson LABOR, MD  FLUoxetine (PROZAC) 20 MG capsule Take 20 mg by mouth daily.    [provider]  HYDROcodone -acetaminophen  (NORCO) 5-325 MG tablet Take 1 tablet by mouth every 4 (four) hours as needed for moderate pain (pain score 4-6). 07/04/23   Elnor Jayson LABOR, DO   methocarbamol (ROBAXIN) 500 MG tablet Take 500 mg by mouth at bedtime as needed. 03/24/23   [provider]  metoprolol  succinate (TOPROL  XL) 25 MG 24 hr tablet Take 1 tablet (25 mg total) by mouth at bedtime. 12/13/23   Cindie Ole ONEIDA, MD  Multiple Vitamin (ONE-A-DAY MENS PO) Take 1 tablet by mouth every morning.    [provider]  ondansetron  (ZOFRAN -ODT) 4 MG disintegrating tablet Take 1 tablet (4 mg total) by mouth every 4 (four) hours as needed for nausea or vomiting. 07/04/23   Elnor Jayson LABOR, DO  pantoprazole  (PROTONIX ) 40 MG tablet TAKE 1 TABLET(40 MG) BY MOUTH DAILY 12/21/19   Allred, Lynwood, MD  triamcinolone cream (KENALOG) 0.1 % Apply 1 application topically daily as needed (for irritation).     [provider]    Allergies: Morphine     Review of Systems  Gastrointestinal:  Positive for abdominal pain.  All other systems reviewed and are negative.   Updated Vital Signs BP 115/80 (BP Location: Right Arm)   Pulse 64   Temp 98.5 F (36.9 C) (Oral)   Resp 17   Ht 1.829 m (6')   Wt (!) 145.2 kg   SpO2 97%   BMI 43.40 kg/m   Physical Exam Vitals and nursing note reviewed.  Constitutional:      General: He is not in acute distress.  Appearance: He is well-developed.  HENT:     Head: Normocephalic and atraumatic.     Mouth/Throat:     Pharynx: No oropharyngeal exudate.  Eyes:     General: No scleral icterus.       Right eye: No discharge.        Left eye: No discharge.     Conjunctiva/sclera: Conjunctivae normal.     Pupils: Pupils are equal, round, and reactive to light.  Neck:     Thyroid : No thyromegaly.     Vascular: No JVD.  Cardiovascular:     Rate and Rhythm: Normal rate and regular rhythm.     Heart sounds: Normal heart sounds. No murmur heard.    No friction rub. No gallop.  Pulmonary:     Effort: Pulmonary effort is normal. No respiratory distress.     Breath sounds: Normal breath sounds. No wheezing or rales.   Abdominal:     General: Bowel sounds are normal. There is no distension.     Palpations: Abdomen is soft. There is no mass.     Tenderness: There is abdominal tenderness.     Comments: Minimal abdominal tenderness right upper quadrant, no guarding  Musculoskeletal:        General: No tenderness. Normal range of motion.     Cervical back: Normal range of motion and neck supple.     Right lower leg: No edema.     Left lower leg: No edema.  Lymphadenopathy:     Cervical: No cervical adenopathy.  Skin:    General: Skin is warm and dry.     Findings: No erythema or rash.  Neurological:     Mental Status: He is alert.     Coordination: Coordination normal.  Psychiatric:        Behavior: Behavior normal.     (all labs ordered are listed, but only abnormal results are displayed) Labs Reviewed - No data to display  EKG: None  Radiology: No results found.   Procedures   Medications Ordered in the ED  sodium chloride  0.9 % bolus 2,000 mL (2,000 mLs Intravenous New Bag/Given 07/22/24 1312)  ketorolac  (TORADOL ) 30 MG/ML injection 30 mg (30 mg Intravenous Given 07/22/24 1353)    Clinical Course as of 07/22/24 1406  Sun Jul 22, 2024  1323 IV fluids being given for what appears to be mild chronic pancreatitis [BM]    Clinical Course User Index [BM] Cleotilde Rogue, MD                                 Medical Decision Making Risk Prescription drug management.   This patient is mildly symptomatic of what appears to be early pancreatitis.  His vital signs are totally normal he is not tachycardic or hypotensive or febrile.  He has a very well-known and documented history of recurrent pancreatitis.  He declines pain medicines, he appears very sincere and is agreeable to minimal workup given the known history of what he has in the very exact symptoms that he usually has.  At this time I think it is reasonable to give a couple liters of IV fluid, I have asked him to request pain  medicine if he should so desire.  I think this is reasonable and avoids imaging and expensive workup for something that is the likely answer of mild early chronic recurrent pancreatitis  Patient requested Toradol , IV fluids given, 2 L  Vital  signs remained stable, patient agreeable to discharge, prescriptions for Mobic  and Zofran  given     Final diagnoses:  Chronic pancreatitis, unspecified pancreatitis type [facility])    ED Discharge Orders          Ordered    ondansetron  (ZOFRAN ) 4 MG tablet  Every 6 hours        07/22/24 1405    meloxicam  (MOBIC ) 15 MG tablet  Daily        07/22/24 1405               Cleotilde Rogue, MD 07/22/24 1406

## 2024-08-02 DIAGNOSIS — K861 Other chronic pancreatitis: Secondary | ICD-10-CM | POA: Diagnosis not present

## 2024-08-02 DIAGNOSIS — Z6841 Body Mass Index (BMI) 40.0 and over, adult: Secondary | ICD-10-CM | POA: Diagnosis not present

## 2024-09-01 NOTE — Progress Notes (Unsigned)
" °  Electrophysiology Office Follow up Visit Note:    Date:  09/03/2024   ID:  Nathaniel [name], DOB 12/31/1990, MRN [id number]  PCP:  Orpha Yancey LABOR, MD  [facility] Cardiologist:  OLE ONEIDA HOLTS, MD  [facility] Electrophysiologist:  OLE ONEIDA HOLTS, MD    Interval History:     Nathaniel [name] is a 34 y.o. male who presents for a follow up visit.  I saw him last 10/12/2023. He has a history of AF s/p convergent ablation and LAAC. He was maintained on dofetilide  but elected to stop this medication in February given good rhythm control. He takes aspirin  81mg  daily.   He is doing well today.  He is with his wife today.  He reports good control of his arrhythmia.  He uses an Apple watch to monitor his heart rhythms.        Past medical, surgical, social and family history were reviewed.  ROS:   Please see the history of present illness.    All other systems reviewed and are negative.  EKGs/Labs/Other Studies Reviewed:    The following studies were reviewed today:     EKG Interpretation Date/Time:  Monday September 03 2024 09:09:08 EST Ventricular Rate:  72 PR Interval:  152 QRS Duration:  84 QT Interval:  404 QTC Calculation: 442 R Axis:   46  Text Interpretation: Normal sinus rhythm Confirmed by Holts Ole [phone]) on 09/03/2024 9:11:39 AM    Physical Exam:    VS:  Wt (!) 329 lb (149.2 kg)   BMI 44.62 kg/m     Wt Readings from Last 3 Encounters:  09/03/24 (!) 329 lb (149.2 kg)  07/22/24 (!) 320 lb (145.2 kg)  10/12/23 (!) 334 lb 6.4 oz (151.7 kg)     GEN: no distress CARD: RRR, No MRG RESP: No IWOB. CTAB.      ASSESSMENT:    1. Atrial fibrillation, persistent (HCC)    PLAN:    In order of problems listed above:  #AFL #AF Doing well. Continue aspirin  Continue metop  Can follow up with EP in 1 year.  I discussed my upcoming departure from [facility] during today's clinic appointment.  The patient will continue to follow-up  with one of my EP partners moving forward.    Signed, Ole Holts, MD, [facility], [facility] 09/03/2024 9:11 AM    Electrophysiology [facility] "

## 2024-09-03 ENCOUNTER — Ambulatory Visit: Admitting: Cardiology

## 2024-09-03 VITALS — BP 126/80 | HR 72 | Ht 72.0 in | Wt 329.0 lb

## 2024-09-03 DIAGNOSIS — I4819 Other persistent atrial fibrillation: Secondary | ICD-10-CM

## 2024-09-03 NOTE — Patient Instructions (Signed)
 Medication Instructions:  Your physician recommends that you continue on your current medications as directed. Please refer to the Current Medication list given to you today.  *If you need a refill on your cardiac medications before your next appointment, please call your pharmacy*  Follow-Up: At [facility], you and your health needs are our priority.  As part of our continuing mission to provide you with exceptional heart care, our providers are all part of one team.  This team includes your primary Cardiologist (physician) and Advanced Practice Providers or APPs (Physician Assistants and Nurse Practitioners) who all work together to provide you with the care you need, when you need it.  Your next appointment:   1 year  Provider:   You may see one of the following Advanced Practice Providers on your designated Care Team:   Charlies Arthur, [place] Ozell Jodie Passey, PA-C Suzann Riddle, NP Daphne Barrack, NP Artist Pouch, PA-C
